# Patient Record
Sex: Male | Born: 1935 | Race: White | Hispanic: No | Smoking: Never smoker
Health system: Southern US, Community
[De-identification: ages and names within clinical notes are randomized; demographics above are authoritative.]

## PROBLEM LIST (undated history)

## (undated) DIAGNOSIS — I459 Conduction disorder, unspecified: Secondary | ICD-10-CM

## (undated) DIAGNOSIS — F419 Anxiety disorder, unspecified: Secondary | ICD-10-CM

## (undated) DIAGNOSIS — I480 Paroxysmal atrial fibrillation: Secondary | ICD-10-CM

## (undated) DIAGNOSIS — I10 Essential (primary) hypertension: Secondary | ICD-10-CM

## (undated) DIAGNOSIS — M1990 Unspecified osteoarthritis, unspecified site: Secondary | ICD-10-CM

## (undated) DIAGNOSIS — I2510 Atherosclerotic heart disease of native coronary artery without angina pectoris: Secondary | ICD-10-CM

## (undated) DIAGNOSIS — F329 Major depressive disorder, single episode, unspecified: Secondary | ICD-10-CM

## (undated) DIAGNOSIS — F32A Depression, unspecified: Secondary | ICD-10-CM

## (undated) DIAGNOSIS — E785 Hyperlipidemia, unspecified: Secondary | ICD-10-CM

## (undated) HISTORY — PX: OTHER SURGICAL HISTORY: SHX169

## (undated) HISTORY — DX: Atherosclerotic heart disease of native coronary artery without angina pectoris: I25.10

## (undated) HISTORY — PX: INGUINAL HERNIA REPAIR: SUR1180

## (undated) HISTORY — DX: Essential (primary) hypertension: I10

## (undated) HISTORY — DX: Hyperlipidemia, unspecified: E78.5

## (undated) HISTORY — PX: MYRINGOTOMY WITH TUBE PLACEMENT: SHX5663

## (undated) HISTORY — PX: TONSILLECTOMY AND ADENOIDECTOMY: SUR1326

## (undated) HISTORY — PX: APPENDECTOMY: SHX54

## (undated) HISTORY — PX: CHOLECYSTECTOMY OPEN: SUR202

## (undated) HISTORY — PX: CATARACT EXTRACTION: SUR2

## (undated) HISTORY — PX: COLONOSCOPY W/ BIOPSIES AND POLYPECTOMY: SHX1376

---

## 2004-09-24 ENCOUNTER — Ambulatory Visit: Payer: Self-pay | Admitting: Cardiology

## 2004-10-23 ENCOUNTER — Ambulatory Visit: Payer: Self-pay | Admitting: Family Medicine

## 2005-05-16 ENCOUNTER — Ambulatory Visit: Payer: Self-pay | Admitting: Cardiology

## 2005-06-03 ENCOUNTER — Ambulatory Visit: Payer: Self-pay | Admitting: Cardiology

## 2005-06-11 ENCOUNTER — Ambulatory Visit: Payer: Self-pay

## 2005-07-01 ENCOUNTER — Ambulatory Visit: Payer: Self-pay | Admitting: Family Medicine

## 2005-09-18 ENCOUNTER — Ambulatory Visit: Payer: Self-pay | Admitting: Family Medicine

## 2005-10-22 ENCOUNTER — Ambulatory Visit: Payer: Self-pay | Admitting: Family Medicine

## 2006-01-08 ENCOUNTER — Ambulatory Visit: Payer: Self-pay | Admitting: Family Medicine

## 2006-05-27 ENCOUNTER — Ambulatory Visit: Payer: Self-pay | Admitting: Cardiology

## 2006-06-23 ENCOUNTER — Ambulatory Visit: Payer: Self-pay | Admitting: Cardiology

## 2006-07-03 ENCOUNTER — Ambulatory Visit: Payer: Self-pay | Admitting: Family Medicine

## 2006-08-21 ENCOUNTER — Ambulatory Visit: Payer: Self-pay | Admitting: Family Medicine

## 2007-06-03 ENCOUNTER — Ambulatory Visit: Payer: Self-pay | Admitting: Cardiology

## 2007-09-01 ENCOUNTER — Ambulatory Visit: Payer: Self-pay | Admitting: Family Medicine

## 2007-09-17 ENCOUNTER — Telehealth: Payer: Self-pay | Admitting: Family Medicine

## 2007-09-29 ENCOUNTER — Ambulatory Visit: Payer: Self-pay | Admitting: Family Medicine

## 2007-09-29 DIAGNOSIS — E785 Hyperlipidemia, unspecified: Secondary | ICD-10-CM | POA: Insufficient documentation

## 2007-09-29 DIAGNOSIS — I2510 Atherosclerotic heart disease of native coronary artery without angina pectoris: Secondary | ICD-10-CM | POA: Insufficient documentation

## 2007-09-29 DIAGNOSIS — F341 Dysthymic disorder: Secondary | ICD-10-CM | POA: Insufficient documentation

## 2007-09-29 DIAGNOSIS — N400 Enlarged prostate without lower urinary tract symptoms: Secondary | ICD-10-CM | POA: Insufficient documentation

## 2007-10-08 ENCOUNTER — Encounter: Payer: Self-pay | Admitting: Family Medicine

## 2007-10-08 LAB — CONVERTED CEMR LAB: PSA: 0.36 ng/mL (ref 0.10–4.00)

## 2008-04-12 ENCOUNTER — Ambulatory Visit: Payer: Self-pay | Admitting: Family Medicine

## 2008-04-12 DIAGNOSIS — G4700 Insomnia, unspecified: Secondary | ICD-10-CM | POA: Insufficient documentation

## 2008-04-12 DIAGNOSIS — C44300 Unspecified malignant neoplasm of skin of unspecified part of face: Secondary | ICD-10-CM | POA: Insufficient documentation

## 2008-04-12 DIAGNOSIS — M722 Plantar fascial fibromatosis: Secondary | ICD-10-CM | POA: Insufficient documentation

## 2008-04-12 DIAGNOSIS — C44309 Unspecified malignant neoplasm of skin of other parts of face: Secondary | ICD-10-CM | POA: Insufficient documentation

## 2008-05-04 ENCOUNTER — Encounter: Payer: Self-pay | Admitting: Family Medicine

## 2008-05-26 ENCOUNTER — Ambulatory Visit: Payer: Self-pay | Admitting: Cardiology

## 2008-05-26 LAB — CONVERTED CEMR LAB
Albumin: 3.6 g/dL (ref 3.5–5.2)
BUN: 33 mg/dL — ABNORMAL HIGH (ref 6–23)
Basophils Relative: 0.5 % (ref 0.0–1.0)
Bilirubin, Direct: 0.2 mg/dL (ref 0.0–0.3)
Calcium: 8.8 mg/dL (ref 8.4–10.5)
Chloride: 107 meq/L (ref 96–112)
Creatinine, Ser: 1.2 mg/dL (ref 0.4–1.5)
GFR calc Af Amer: 74 mL/min
GFR calc non Af Amer: 61 mL/min
Glucose, Bld: 104 mg/dL — ABNORMAL HIGH (ref 70–99)
HCT: 45.8 % (ref 39.0–52.0)
LDL Cholesterol: 74 mg/dL (ref 0–99)
Lymphocytes Relative: 20.8 % (ref 12.0–46.0)
MCHC: 34.7 g/dL (ref 30.0–36.0)
Monocytes Relative: 8.5 % (ref 3.0–12.0)
RDW: 12.9 % (ref 11.5–14.6)
Sodium: 138 meq/L (ref 135–145)
Total Protein: 6.5 g/dL (ref 6.0–8.3)

## 2008-06-22 ENCOUNTER — Telehealth: Payer: Self-pay | Admitting: Family Medicine

## 2008-08-23 ENCOUNTER — Ambulatory Visit: Payer: Self-pay | Admitting: Family Medicine

## 2008-09-17 ENCOUNTER — Emergency Department (HOSPITAL_COMMUNITY): Admission: EM | Admit: 2008-09-17 | Discharge: 2008-09-17 | Payer: Self-pay | Admitting: Emergency Medicine

## 2008-11-01 ENCOUNTER — Ambulatory Visit: Payer: Self-pay | Admitting: Family Medicine

## 2009-06-02 DIAGNOSIS — I10 Essential (primary) hypertension: Secondary | ICD-10-CM | POA: Insufficient documentation

## 2009-06-05 ENCOUNTER — Ambulatory Visit: Payer: Self-pay | Admitting: Cardiology

## 2009-06-05 DIAGNOSIS — I509 Heart failure, unspecified: Secondary | ICD-10-CM | POA: Insufficient documentation

## 2009-08-15 ENCOUNTER — Ambulatory Visit: Payer: Self-pay | Admitting: Family Medicine

## 2009-10-10 ENCOUNTER — Telehealth: Payer: Self-pay | Admitting: Family Medicine

## 2009-12-26 ENCOUNTER — Ambulatory Visit: Payer: Self-pay | Admitting: Family Medicine

## 2009-12-26 DIAGNOSIS — L851 Acquired keratosis [keratoderma] palmaris et plantaris: Secondary | ICD-10-CM | POA: Insufficient documentation

## 2009-12-26 DIAGNOSIS — J029 Acute pharyngitis, unspecified: Secondary | ICD-10-CM | POA: Insufficient documentation

## 2009-12-26 DIAGNOSIS — J209 Acute bronchitis, unspecified: Secondary | ICD-10-CM | POA: Insufficient documentation

## 2010-01-02 ENCOUNTER — Telehealth: Payer: Self-pay | Admitting: Family Medicine

## 2010-03-30 ENCOUNTER — Telehealth: Payer: Self-pay | Admitting: Cardiology

## 2010-05-01 ENCOUNTER — Telehealth: Payer: Self-pay | Admitting: Cardiology

## 2010-05-02 ENCOUNTER — Telehealth: Payer: Self-pay | Admitting: Cardiology

## 2010-06-04 ENCOUNTER — Ambulatory Visit: Payer: Self-pay | Admitting: Cardiology

## 2010-06-05 ENCOUNTER — Encounter: Payer: Self-pay | Admitting: Cardiology

## 2010-07-30 ENCOUNTER — Ambulatory Visit: Payer: Self-pay | Admitting: Family Medicine

## 2010-08-27 ENCOUNTER — Telehealth: Payer: Self-pay | Admitting: Cardiology

## 2010-09-04 ENCOUNTER — Telehealth: Payer: Self-pay | Admitting: Cardiology

## 2010-10-24 ENCOUNTER — Telehealth: Payer: Self-pay | Admitting: Cardiology

## 2010-11-20 ENCOUNTER — Telehealth: Payer: Self-pay | Admitting: Cardiology

## 2010-12-16 LAB — CONVERTED CEMR LAB
AST: 23 units/L (ref 0–37)
Albumin: 3.7 g/dL (ref 3.5–5.2)
Alkaline Phosphatase: 79 units/L (ref 39–117)
Bilirubin, Direct: 0.1 mg/dL (ref 0.0–0.3)
Bilirubin, Direct: 0.2 mg/dL (ref 0.0–0.3)
Cholesterol: 125 mg/dL (ref 0–200)
GFR calc non Af Amer: 67.24 mL/min (ref >60)
Glucose, Bld: 101 mg/dL — ABNORMAL HIGH (ref 70–99)
HCT: 44.7 % (ref 39.0–52.0)
Hemoglobin: 15.6 g/dL (ref 13.0–17.0)
Lymphocytes Relative: 20.1 % (ref 12.0–46.0)
MCHC: 34.8 g/dL (ref 30.0–36.0)
Monocytes Absolute: 0.5 10*3/uL (ref 0.1–1.0)
Neutro Abs: 3.7 10*3/uL (ref 1.4–7.7)
Neutrophils Relative %: 68.6 % (ref 43.0–77.0)
Platelets: 109 10*3/uL — ABNORMAL LOW (ref 150.0–400.0)
Potassium: 4.6 meq/L (ref 3.5–5.1)
RBC: 4.82 M/uL (ref 4.22–5.81)
Sodium: 141 meq/L (ref 135–145)
Total Bilirubin: 1.7 mg/dL — ABNORMAL HIGH (ref 0.3–1.2)
Total CHOL/HDL Ratio: 4
VLDL: 19.6 mg/dL (ref 0.0–40.0)
VLDL: 20.6 mg/dL (ref 0.0–40.0)

## 2010-12-20 NOTE — Progress Notes (Signed)
Summary: [facility] with [facility]   Phone Note Call from Patient Call back at Home Phone [phone]   Caller: Patient Reason for Call: Talk to Nurse Details for Reason: pt having a hard time getting [facility]. offer to send to local pharmacy, pt explain it very detail will explain to pam when she calls. Initial call taken by: Lorne Skeens,  September 04, 2010 11:04 AM  Follow-up for Phone Call        Spoke with pt - [facility] told [facility] that they hadn't sent the rx for simvastatin and metoprolol because of the signature on the rx.  pt has recieved lisinopril that was sent the same time and same way - after speaking with [facility]  pt states they said they will be sent ASAP.  he will let us know if he doesnt recieve them in time. Follow-up by: Charolotte Capuchin, RN,  September 04, 2010 11:26 AM

## 2010-12-20 NOTE — Letter (Signed)
Summary: Custom - Lipid  [facility], Main Office  [address]   [place], [place] [address]   Phone: [phone]  Fax: [phone]         June 05, 2010 MRN: [id number]     [name] [name] [address] [place], [place]  [address]     Dear Mr. Corigliano,  We have reviewed your cholesterol results.  They are as follows:     Total Cholesterol:    129 (Desirable: less than 200)       HDL  Cholesterol:     35.20  (Desirable: greater than 40 for men and 50 for women)       LDL Cholesterol:       73  (Desirable: less than 100 for low risk and less than 70 for moderate to high risk)       Triglycerides:       103.0  (Desirable: less than 150)  Our recommendations include:  NO change in treatment   Call our office at the number listed above if you have any questions.  Lowering your LDL cholesterol is important, but it is only one of a large number of "risk factors" that may indicate that you are at risk for heart disease, stroke or other complications of hardening of the arteries.  Other risk factors include:   A.  Cigarette Smoking* B.  High Blood Pressure* C.  Obesity* D.   Low HDL Cholesterol (see yours above)* E.   Diabetes Mellitus (higher risk if your is uncontrolled) F.  Family history of premature heart disease G.  Previous history of stroke or cardiovascular disease        *These are risk factors YOU HAVE CONTROL OVER.  For more information, visit .  There is now evidence that lowering the TOTAL CHOLESTEROL AND LDL CHOLESTEROL can reduce the risk of heart disease.  The American Heart Association recommends the following guidelines for the treatment of elevated cholesterol:  1.  If there is now current heart disease and less than two risk factors, TOTAL CHOLESTEROL should be less than 200 and LDL CHOLESTEROL should be less than 100. 2.  If there is current heart disease or two or more risk factors, TOTAL CHOLESTEROL should be less than 200 and  LDL CHOLESTEROL should be less than 70.  A diet low in cholesterol, saturated fat, and calories is the cornerstone of treatment for elevated cholesterol.  Cessation of smoking and exercise are also important in the management of elevated cholesterol and preventing vascular disease.  Studies have shown that 30 to 60 minutes of physical activity most days can help lower blood pressure, lower cholesterol, and keep your weight at a healthy level.  Drug therapy is used when cholesterol levels do not respond to therapeutic lifestyle changes (smoking cessation, diet, and exercise) and remains unacceptably high.  If medication is started, it is important to have you levels checked periodically to evaluate the need for further treatment options.      Thank you,     Sander Nephew, RN for Dr. Rollene Rotunda [facility] Team

## 2010-12-20 NOTE — Progress Notes (Signed)
Summary: refill request   Phone Note Refill Request Message from:  Patient on [facility]  Refills Requested: Medication #1:  TOPROL XL 50 MG XR24H-TAB 1 by mouth daily  Medication #2:  SIMVASTATIN 40 MG TABS Take one tablet by mouth daily at bedtime  Medication #3:  LISINOPRIL 10 MG TABS 1 by mouth daily [facility] 90 day supply with refills/if need to talk to pt [phone]   Method Requested: Telephone to Pharmacy Initial call taken by: Glynda Jaeger,  November 20, 2010 8:35 AM  Follow-up for Phone Call        RX sent into [facility]. Line busy. Marrion Coy, CNA  November 20, 2010 10:37 AM  Follow-up by: Marrion Coy, CNA,  November 20, 2010 10:37 AM  Additional Follow-up for Phone Call Additional follow up Details #1::        Pt notified. Marrion Coy, CNA  November 20, 2010 1:59 PM  Additional Follow-up by: Marrion Coy, CNA,  November 20, 2010 1:59 PM    Prescriptions: LISINOPRIL 10 MG TABS (LISINOPRIL) 1 by mouth daily  #90 x 3   Entered by:   Marrion Coy, CNA   Authorized by:   Rollene Rotunda, MD, [facility]   Signed by:   Marrion Coy, CNA on 11/20/2010   Method used:   Faxed to ...       [facility] [place] (mail-order)             , [place]         Ph: [phone]       Fax: [phone]   RxID:   [id number] TOPROL XL 50 MG XR24H-TAB (METOPROLOL SUCCINATE) 1 by mouth daily  #90 x 3   Entered by:   Marrion Coy, CNA   Authorized by:   Rollene Rotunda, MD, [facility]   Signed by:   Marrion Coy, CNA on 11/20/2010   Method used:   Faxed to ...       [facility] [place] (mail-order)             , [place]         Ph: [phone]       Fax: [phone]   RxID:   [id number] SIMVASTATIN 40 MG TABS (SIMVASTATIN) Take one tablet by mouth daily at bedtime  #90 x 3   Entered by:   Marrion Coy, CNA   Authorized by:   Rollene Rotunda, MD, [facility]   Signed by:   Marrion Coy, CNA on 11/20/2010   Method used:   Faxed to ...       [facility] [place] (mail-order)             , [place]         Ph: [phone]       Fax: [phone]   RxID:    [id number]

## 2010-12-20 NOTE — Assessment & Plan Note (Signed)
Summary: flu shot/njr   Nurse Visit   Review of Systems       Flu Vaccine Consent Questions     Do you have a history of severe allergic reactions to this vaccine? no    Any prior history of allergic reactions to egg and/or gelatin? no    Do you have a sensitivity to the preservative Thimersol? no    Do you have a past history of Guillan-Barre Syndrome? no    Do you currently have an acute febrile illness? no    Have you ever had a severe reaction to latex? no    Vaccine information given and explained to patient? yes    Are you currently pregnant? no    Lot Number:[id number]   Exp Date:05/18/2011   Site Given  Left Deltoid IM Josph Macho RMA  July 30, 2010 3:11 PM    Allergies: No Known Drug Allergies  Orders Added: 1)  Flu Vaccine 3yrs + MEDICARE PATIENTS [Q2039] 2)  Administration Flu vaccine - MCR [G0008]

## 2010-12-20 NOTE — Progress Notes (Signed)
Summary: [facility]ill [facility]  Medications Added SIMVASTATIN 40 MG TABS (SIMVASTATIN) Take one tablet by mouth daily at bedtime TOPROL XL 50 MG XR24H-TAB (METOPROLOL SUCCINATE) 1 by mouth daily       Phone Note Refill Request Call back at Home Phone [phone] Message from:  Patient on August 27, 2010 12:54 PM  Refills Requested: Medication #1:  SIMVASTATIN 40 MG TABS Take one tablet by mouth daily at bedtime  Medication #2:  TOPROL XL 50 MG XR24H-TAB 1 by mouth daily  Medication #3:  TOPROL XL 50 MG XR24H-TAB 1 by [facility]   Method Requested: Fax to [facility] Initial call taken by: Lorne Skeens,  August 27, 2010 12:55 PM  Follow-up for Phone Call        RX sent into pharmacy. Pt notified. Marrion Coy, CNA  August 27, 2010 12:57 PM  Follow-up by: Marrion Coy, CNA,  August 27, 2010 12:57 PM    New/Updated Medications: SIMVASTATIN 40 MG TABS (SIMVASTATIN) Take one tablet by mouth daily at bedtime TOPROL XL 50 MG XR24H-TAB (METOPROLOL SUCCINATE) 1 by mouth daily Prescriptions: TOPROL XL 50 MG XR24H-TAB (METOPROLOL SUCCINATE) 1 by mouth daily  #90 x 3   Entered by:   Marrion Coy, CNA   Authorized by:   Rollene Rotunda, MD, [facility]   Signed by:   Marrion Coy, CNA on 08/27/2010   Method used:   Faxed to ...       [facility] education [facility])       [address] [address]       [place], [place]  [address]       Ph: [phone]       Fax: [phone]   RxID:   [id number] SIMVASTATIN 40 MG TABS (SIMVASTATIN) Take one tablet by mouth daily at bedtime  #90 x 3   Entered by:   Marrion Coy, CNA   Authorized by:   Rollene Rotunda, MD, [facility]   Signed by:   Marrion Coy, CNA on 08/27/2010   Method used:   Faxed to ...       [facility] Environmental education officer)       [address] [address]       [place], [place]  [address]       Ph: [phone]       Fax: [phone]   RxID:   [id number]

## 2010-12-20 NOTE — Assessment & Plan Note (Signed)
Summary: YEARLY./SL  Medications Added VIAGRA 50 MG TABS (SILDENAFIL CITRATE) take one by mouth one hour prior to sexual activity      Allergies Added: NKDA   Visit Type:  Follow-up Primary Provider:  Judithann Sheen MD  CC:  Cardiomyopathy.  History of Present Illness: The patient presents for yearly followup. From a cardiovascular standpoint he has done well. He denies any new chest pressure, neck or arm discomfort. He has no new palpitations, presyncope or syncope. He has some rare dizziness after working. He's had some rare chest discomfort that is similar to previous reflux. He is still able to do his gardening. He has some limitations because of joint discomfort particularly in his shoulders.  Current Medications (verified): 1)  Xanax 1 Mg Tabs (Alprazolam) .Marland Kitchen.. 1 By Mouth Two Times A Day Needs Appt With Dr Scotty Court. 2)  Simvastatin 40 Mg Tabs (Simvastatin) .... Take One Tablet By Mouth Daily At Bedtime 3)  Toprol Xl 50 Mg Xr24h-Tab (Metoprolol Succinate) .Marland Kitchen.. 1 By Mouth Daily 4)  Lisinopril 10 Mg Tabs (Lisinopril) .Marland Kitchen.. 1 By Mouth Daily 5)  Asa 81 Mg .Marland Kitchen.. 1 Once Daily  Allergies (verified): No Known Drug Allergies  Past History:  Past Medical History: Reviewed history from 06/02/2009 and no changes required. Hypertension, depression, loss of vision in his   right eye, tonsillectomy, colonic polyps, cholecystectomy, bilateral   inguinal hernia repair, and stress perfusion study demonstrating an EF   of 46% with moderate ischemia in the inferior wall and apex (this has   been managed medically).   Past Surgical History: Reviewed history from 06/02/2009 and no changes required.  Tonsillectomy, colonic polyps, cholecystectomy, bilateral   inguinal hernia repair  Review of Systems       As stated in the HPI and negative for all other systems.   Vital Signs:  Patient profile:   77 year old male Height:      69 inches Weight:      184 pounds BMI:      27.27 Pulse rate:   53 / minute Resp:     16 per minute BP sitting:   148 / 76  (right arm)  Vitals Entered By: Marrion Coy, CNA (June 04, 2010 9:04 AM)  Physical Exam  General:  Well developed, well nourished, in no acute distress. Head:  normocephalic and atraumatic Eyes:  Right lens opacification,  Mouth:  pharyngeal erythema.   Neck:  Neck supple, no JVD. No masses, thyromegaly or abnormal cervical nodes. Chest Wall:  no deformities or breast masses noted Lungs:  Clear bilaterally to auscultation and percussion. Abdomen:  Bowel sounds positive; abdomen soft and non-tender without masses, organomegaly, or hernias noted. No hepatosplenomegaly. Msk:  Right upper extremity muscular wasting Extremities:  No clubbing or cyanosis. Neurologic:  Alert and oriented x 3. Skin:  Intact without lesions or rashes. Cervical Nodes:  no significant adenopathy Inguinal Nodes:  no significant adenopathy Psych:  Normal affect.   Detailed Cardiovascular Exam  Neck    Carotids: Carotids full and equal bilaterally without bruits.      Neck Veins: Normal, no JVD.    Heart    Inspection: no deformities or lifts noted.      Palpation: normal PMI with no thrills palpable.      Auscultation: regular rate and rhythm, S1, S2 without murmurs, rubs, gallops, or clicks.    Vascular    Abdominal Aorta: no palpable masses, pulsations, or audible bruits.      Femoral  Pulses: normal femoral pulses bilaterally.      Pedal Pulses: normal pedal pulses bilaterally.      Radial Pulses: normal radial pulses bilaterally.      Peripheral Circulation: no clubbing, cyanosis, or edema noted with normal capillary refill.     EKG  Procedure date:  06/04/2010  Findings:      Sinus rhythm, rate 53, axis within normal limits, intervals within normal limits, RSR prime V1, incomplete right bundle branch block  Impression & Recommendations:  Problem # 1:  CARDIOMYOPATHY (ICD-425.4) He has no symptoms and no  suggestion that his EF is lower. He will continue with medical management. Orders: EKG w/ Interpretation (93000)  Problem # 2:  HYPERTENSION (ICD-401.9) His blood pressure is very slightly elevated but he did not take his medications. I will make no change to his regimen. His pressure has been well-controlled at other appointments.  Problem # 3:  HYPERLIPIDEMIA (ICD-272.4) He is getting a lipid profile today. The goal will be an LDL less than 100 and HDL greater than 40.  Problem # 4:  CAD (ICD-414.00) He has had an abnormal stress test with evidence of coronary disease. He is being managed medically with risk reduction. Orders: EKG w/ Interpretation (93000)  Patient Instructions: 1)  Your physician recommends that you schedule a follow-up appointment in: 12 months with Dr Antoine Poche 2)  Your physician recommends that you continue on your current medications as directed. Please refer to the Current Medication list given to you today. Prescriptions: VIAGRA 50 MG TABS (SILDENAFIL CITRATE) take one by mouth one hour prior to sexual activity  #20 x 1   Entered by:   Charolotte Capuchin, RN   Authorized by:   Rollene Rotunda, MD, [facility]   Signed by:   Charolotte Capuchin, RN on 06/04/2010   Method used:   Electronically to        [facility]* (retail)       [address]       [place], [place]  [id number]       Ph: [phone]       Fax: [phone]   RxID:   [id number]  I have reviewed and approved all prescriptions at the time of this visit. Rollene Rotunda, MD, [facility]  June 04, 2010 9:41 AM

## 2010-12-20 NOTE — Progress Notes (Signed)
Summary: still coughing but better  Phone Note Call from Patient Call back at Home Phone [phone]   Caller: Patient Call For: Judithann Sheen MD Summary of Call: pt was given a z-pack on 12-26-2009 pt little better.Marland Kitchen dx with flu, pharmacy gate [phone] Initial call taken by: Heron Sabins,  January 02, 2010 8:48 AM  Follow-up for Phone Call        took z-pak and taking cough syrup, cough seems looser. No appetite and fatigued but no fever or SOB. Just wondered if he should have more medicine. I told him it will probably take some time to fully recover; rest and fluids. Follow-up by: Raechel Ache, RN,  January 02, 2010 9:08 AM  Additional Follow-up for Phone Call Additional follow up Details #1::        per dr Scotty Court good advice and if no better ov  Additional Follow-up by: Pura Spice, RN,  January 02, 2010 11:01 AM

## 2010-12-20 NOTE — Progress Notes (Signed)
Summary: lab work   Phone Note Call from Patient Call back at [employer] [phone]   Caller: Patient Reason for Call: Talk to Nurse Summary of Call: request lab work prior to appt in July Initial call taken by: Migdalia Dk,  Mar 30, 2010 9:36 AM  Follow-up for Phone Call        discussed with pt--pt wants cholesterol checked along with any other lab Dr Antoine Poche wants him to have the morning of his appointment with Dr Antoine Poche in July--will forward to Dr Antoine Poche for lab orders Katina Dung, RN, BSN  Mar 30, 2010 10:11 AM   Additional Follow-up for Phone Call Additional follow up Details #1::        Schedule lipid and liver. LM for pt to call Katina Dung, RN, BSN  Mar 30, 2010 3:17 PM ---called pt and ordered lipid/liver prior to July 18 appt with Dr Antoine Poche Additional Follow-up by: Rollene Rotunda, MD, [facility],  Mar 30, 2010 1:44 PM

## 2010-12-20 NOTE — Assessment & Plan Note (Signed)
Summary: no appetite/?flu/njr   Vital Signs:  Patient profile:   77 year old male Weight:      188 pounds O2 Sat:      96 % on Room air Temp:     97.6 degrees F Pulse rate:   120 / minute BP sitting:   96 / 70  (left arm)  Vitals Entered By: Gladis Riffle, RN (December 26, 2009 10:29 AM)  O2 Flow:  Room air CC: c/o sore throat, cough, congestion, nausea and no appetite since 2/3 Is Patient Diabetic? No Comments denies fever,--has constipation but states has not eaten x 3 days   History of Present Illness: This 77 year old white male has a history of having had taking cough sore throat and chills over the past week and increasing in severity. He has been taking Aleve for the headache and, continues to have a bad cough and general malaise, also no appetite. No history of vomiting or diarrhea As lesion of left temporal AV block from his check No other complaints blood pressure stable  Preventive Screening-Counseling & Management  Alcohol-Tobacco     Smoking Status: never  Medications Prior to Update: 1)  Xanax 1 Mg Tabs (Alprazolam) .Marland Kitchen.. 1 By Mouth Two Times A Day Needs Appt With Dr Scotty Court. 2)  Simvastatin 40 Mg Tabs (Simvastatin) .... Take One Tablet By Mouth Daily At Bedtime 3)  Toprol Xl 50 Mg .Marland Kitchen.. 1 Once Daily 4)  Lisinopril 10 Mg .Marland Kitchen.. 1 Once Daily 5)  Asa 81 Mg .Marland Kitchen.. 1 Once Daily  Allergies (verified): No Known Drug Allergies  Past History:  Past Medical History: Last updated: 06/02/2009 Hypertension, depression, loss of vision in his   right eye, tonsillectomy, colonic polyps, cholecystectomy, bilateral   inguinal hernia repair, and stress perfusion study demonstrating an EF   of 46% with moderate ischemia in the inferior wall and apex (this has   been managed medically).   Social History: Last updated: 06/02/2009  Non-smoker, non-drinker, no drug abuse, married  NOTE - opthamologist - [place]   Risk Factors: Smoking Status: never (12/26/2009)  Social  History: Smoking Status:  never  Review of Systems  The patient denies anorexia, fever, weight loss, weight gain, vision loss, decreased hearing, hoarseness, chest pain, syncope, dyspnea on exertion, peripheral edema, prolonged cough, headaches, hemoptysis, abdominal pain, melena, hematochezia, severe indigestion/heartburn, hematuria, incontinence, genital sores, muscle weakness, suspicious skin lesions, transient blindness, difficulty walking, depression, unusual weight change, abnormal bleeding, enlarged lymph nodes, angioedema, breast masses, and testicular masses.    Physical Exam  General:  Well-developed,well-nourished,in no acute distress; alert,appropriate and cooperative throughout examination Head:  Normocephalic and atraumatic without obvious abnormalities. No apparent alopecia or balding. Eyes:  No corneal or conjunctival inflammation noted. EOMI. Perrla. Funduscopic exam benign, without hemorrhages, exudates or papilledema. Vision grossly normal. Ears:  External ear exam shows no significant lesions or deformities.  Otoscopic examination reveals clear canals, tympanic membranes are intact bilaterally without bulging, retraction, inflammation or discharge. Hearing is grossly normal bilaterally. Nose:  moderate nasal congestion with slightly yellowed drainage Mouth:  pharyngeal erythema.   Lungs:  rhonchi bilaterally no rales no dullness Heart:  Normal rate and regular rhythm. S1 and S2 normal without gallop, murmur, click, rub or other extra sounds. Skin:  hyperkeratotic lesion on left temporal region of scalp   Impression & Recommendations:  Problem # 1:  PHARYNGITIS, ACUTE (ICD-462) Assessment New  His updated medication list for this problem includes:    Zithromax Z-pak 250 Mg Tabs (Azithromycin) .Marland KitchenMarland KitchenMarland KitchenMarland Kitchen  2 stat then 1 each day  Orders: Prescription Created Electronically [phone])  Problem # 2:  ACUTE BRONCHITIS (ICD-466.0) Assessment: New  His updated medication list for  this problem includes:    Zithromax Z-pak 250 Mg Tabs (Azithromycin) .Marland Kitchen... 2 stat then 1 each day    Hydromet 5-1.5 Mg/37ml Syrp (Hydrocodone-homatropine) .Marland Kitchen... 2 tsp q4h as needed cough  Orders: Prescription Created Electronically [phone])  Problem # 3:  HYPERTENSION (ICD-401.9) Assessment: Improved  Problem # 4:  INSOMNIA (ICD-780.52) Assessment: Improved  Problem # 5:  ANXIETY DEPRESSION (ICD-300.4) Assessment: Improved  Problem # 6:  HYPERKERATOSIS (ICD-701.1) Assessment: New  Complete Medication List: 1)  Xanax 1 Mg Tabs (Alprazolam) .Marland Kitchen.. 1 by mouth two times a day needs appt with dr Scotty Court. 2)  Simvastatin 40 Mg Tabs (Simvastatin) .... Take one tablet by mouth daily at bedtime 3)  Toprol Xl 50 Mg  .Marland Kitchen.. 1 once daily 4)  Lisinopril 10 Mg  .Marland Kitchen.. 1 once daily 5)  Asa 81 Mg  .Marland Kitchen.. 1 once daily 6)  Zithromax Z-pak 250 Mg Tabs (Azithromycin) .... 2 stat then 1 each day 7)  Hydromet 5-1.5 Mg/54ml Syrp (Hydrocodone-homatropine) .... 2 tsp q4h as needed cough  Patient Instructions: 1)  Acute upper respiratory infection, pharyngitis and 2)  acute bronchitis 3)  Zpack 4)  Hydromet cough medicine 5)  Increase your fluid intake 6)  continue aleve for aching and fever Prescriptions: HYDROMET 5-1.5 MG/5ML SYRP (HYDROCODONE-HOMATROPINE) 2 tsp q4h as needed cough  #240 cc x 1   Entered and Authorized by:   Judithann Sheen MD   Signed by:   Judithann Sheen MD on 12/26/2009   Method used:   Print then Give to Patient   RxID:   [id number] ZITHROMAX Z-PAK 250 MG TABS (AZITHROMYCIN) 2 stat then 1 each day  #1 pkge x 0   Entered and Authorized by:   Judithann Sheen MD   Signed by:   Judithann Sheen MD on 12/26/2009   Method used:   Electronically to        [facility]* (retail)       [address]       [place], [place]  [id number]       Ph: [phone]       Fax: [phone]   RxID:   [id number]

## 2010-12-20 NOTE — Progress Notes (Signed)
Summary: refill meds   Phone Note Call from Patient Call back at Home Phone [phone] Message from:  Patient on October 24, 2010 9:42 AM  Refills Requested: Medication #1:  SIMVASTATIN 40 MG TABS Take one tablet by mouth daily at bedtime   Supply Requested: 3 months  Medication #2:  TOPROL XL 50 MG XR24H-TAB 1 by mouth daily   Supply Requested: 3 months  Medication #3:  LISINOPRIL 10 MG TABS 1 by mouth daily   Supply Requested: 3 months [facility]. with 3 refill.   Method Requested: Fax to [facility] Away Pharm[facility] Initial call taken by: Lorne Skeens,  October 24, 2010 9:43 AM Caller: Patient Reason for Call: Talk to Nurse Summary of Call: per pt calling, pt [facility]. pt now cover with [employer] of [place]. pt [facility] rx to [facility] member id# [id number]. effective date 11-18-2010. Initial call taken by: Lorne Skeens,  October 24, 2010 9:43 AM  Follow-up for Phone Call        I talked with Mrs. Wittmeyer and her husband will return call. Mylo Red RN  He wants his cardiac meds. to be switched to [facility] [place] but his [employer] doesn't take effect until 01/12. We can't refill his meds. until his new insurance takes effect. He belives he has enough to last him but will call us back if we need to refill them until we can send them to [facility]. Whitney Maeola Sarah RN  October 24, 2010 3:50 PM  Follow-up by: Whitney Maeola Sarah RN,  October 24, 2010 3:50 PM

## 2010-12-20 NOTE — Progress Notes (Signed)
Summary: pt needs refill  Medications Added SIMVASTATIN 40 MG TABS (SIMVASTATIN) Take one tablet by mouth daily at bedtime TOPROL XL 50 MG XR24H-TAB (METOPROLOL SUCCINATE) 1 by mouth daily LISINOPRIL 10 MG TABS (LISINOPRIL) 1 by mouth daily       Phone Note Refill Request Call back at Home Phone [phone] Message from:  [facility] ID# [id number]  Refills Requested: Medication #1:  TOPROL XL 50 MG 1 once daily  Medication #2:  SIMVASTATIN 40 MG TABS Take one tablet by mouth daily at bedtime  Medication #3:  LISINOPRIL 10 MG 1 once daily per pt the nurse always dose his Rx and knows what he needs he called [facility] and they said they don't call us we have to contact them. When I asked the patient what he needed he said the nurse knew what he needed.  Initial call taken by: Omer Jack,  May 02, 2010 12:39 PM  Follow-up for Phone Call        [facility] for pt to call back. Marrion Coy, CNA  May 02, 2010 3:29 PM  Follow-up by: Marrion Coy, CNA,  May 02, 2010 3:29 PM    New/Updated Medications: SIMVASTATIN 40 MG TABS (SIMVASTATIN) Take one tablet by mouth daily at bedtime TOPROL XL 50 MG XR24H-TAB (METOPROLOL SUCCINATE) 1 by mouth daily LISINOPRIL 10 MG TABS (LISINOPRIL) 1 by mouth daily Prescriptions: SIMVASTATIN 40 MG TABS (SIMVASTATIN) Take one tablet by mouth daily at bedtime  #90 x 0   Entered by:   Marrion Coy, CNA   Authorized by:   Rollene Rotunda, MD, [facility]   Signed by:   Marrion Coy, CNA on 05/02/2010   Method used:   Faxed to ...       [facility] education [facility])       [address] [address]       [place], [place]  [address]       Ph: [phone]       Fax: [phone]   RxID:   [id number] LISINOPRIL 10 MG TABS (LISINOPRIL) 1 by mouth daily  #90 x 0   Entered by:   Marrion Coy, CNA   Authorized by:   Rollene Rotunda, MD, [facility]   Signed by:   Marrion Coy, CNA on 05/02/2010   Method used:   Faxed to ...       [facility] Environmental education officer)  [address] [address]       [place], [place]  [address]       Ph: [phone]       Fax: [phone]   RxID:   [id number] TOPROL XL 50 MG XR24H-TAB (METOPROLOL SUCCINATE) 1 by mouth daily  #90 x 0   Entered by:   Marrion Coy, CNA   Authorized by:   Rollene Rotunda, MD, [facility]   Signed by:   Marrion Coy, CNA on 05/02/2010   Method used:   Faxed to ...       [facility] Environmental education officer)       [address] [address]       [place], [place]  [address]       Ph: [phone]       Fax: [phone]   RxID:   [id number]

## 2010-12-20 NOTE — Progress Notes (Signed)
Summary: med refill   Phone Note Call from Patient Call back at Home Phone [phone]   Caller: Patient Reason for Call: Talk to Nurse Summary of Call: needs refills on meds, does not know the names Initial call taken by: Migdalia Dk,  May 01, 2010 3:00 PM  Follow-up for Phone Call        Called patient and left message on machine for pt to call pharmacy for refills and they will contact us with the medications he needs refills. Follow-up by: Charolotte Capuchin, RN,  May 01, 2010 3:06 PM

## 2010-12-31 ENCOUNTER — Other Ambulatory Visit: Payer: Self-pay | Admitting: Family Medicine

## 2010-12-31 DIAGNOSIS — F419 Anxiety disorder, unspecified: Secondary | ICD-10-CM

## 2011-01-03 NOTE — Telephone Encounter (Signed)
refilled 

## 2011-01-10 ENCOUNTER — Telehealth: Payer: Self-pay | Admitting: Family Medicine

## 2011-01-10 NOTE — Telephone Encounter (Signed)
Pt wants to come in for appt with Dr Scotty Court only.... Adv that he doesn't want to see anyone else... If he can't come in for appt then he wants Rx sent to  [facility] for abx / cough med. Pt # [id number].

## 2011-01-11 NOTE — Telephone Encounter (Signed)
Pt called back to check on status of abx/cough med to [facility]. Pls call something in today and notify pt.

## 2011-02-26 NOTE — Telephone Encounter (Signed)
Called, refilled rx

## 2011-02-26 NOTE — Telephone Encounter (Signed)
called

## 2011-03-27 ENCOUNTER — Encounter: Payer: Self-pay | Admitting: Family Medicine

## 2011-03-27 ENCOUNTER — Ambulatory Visit (INDEPENDENT_AMBULATORY_CARE_PROVIDER_SITE_OTHER): Payer: Medicare Other | Admitting: Family Medicine

## 2011-03-27 VITALS — BP 100/62 | HR 61 | Temp 97.8°F | Wt 193.0 lb

## 2011-03-27 DIAGNOSIS — M1990 Unspecified osteoarthritis, unspecified site: Secondary | ICD-10-CM

## 2011-03-27 DIAGNOSIS — F411 Generalized anxiety disorder: Secondary | ICD-10-CM

## 2011-03-27 DIAGNOSIS — M169 Osteoarthritis of hip, unspecified: Secondary | ICD-10-CM

## 2011-03-27 DIAGNOSIS — G629 Polyneuropathy, unspecified: Secondary | ICD-10-CM

## 2011-03-27 DIAGNOSIS — G4700 Insomnia, unspecified: Secondary | ICD-10-CM

## 2011-03-27 DIAGNOSIS — G589 Mononeuropathy, unspecified: Secondary | ICD-10-CM

## 2011-03-27 MED ORDER — DICLOFENAC SODIUM 75 MG PO TBEC: 75.0000 mg | DELAYED_RELEASE_TABLET | Freq: Two times a day (BID) | ORAL | Status: DC

## 2011-04-02 NOTE — Assessment & Plan Note (Signed)
[name] [name]                            CARDIOLOGY OFFICE NOTE   NAME:[name] [name]                          MRN:          [id number]  DATE:06/03/2007                            DOB:          Feb 20, 1920    PRIMARY:  [name] [name].   REASON FOR PRESENTATION:  Evaluate patient with presumed coronary  disease and mildly reduced ejection fraction.   HISTORY OF PRESENT ILLNESS:  Patient is now 77 years old.  He has done  well from a cardiovascular standpoint since I last saw him.  He did have  one episode of chest discomfort a few weeks ago.  This was after eating  some cold food quickly.  He was not sure whether this was angina.  It  passed after several minutes.  He has not since had any discomfort.  He  is able to work in his yard.  He is under a lot of stress taking care of  an ailing wife and daughter.  He denies any typical chest pressure, neck  or arm discomfort.  He has had no new shortness of breath.  He has had  no PND or orthopnea.  He has had no palpitations, presyncope or syncope.   PAST MEDICAL HISTORY:  1. Hypertension.  2. Depression.  3. 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  4. Tonsillectomy.  5. Colonic polyps.  6. Cholecystectomy.  7. Bilateral inguinal hernia repair.  8. Stress perfusion study demonstrating an EF of 46% with moderate      ischemia in the inferior wall and apex (managed medically).   ALLERGIES:  THE PATIENT HAD AN ANTIBIOTIC REACTION BUT DOES NOT REMEMBER  WHICH ONE.   MEDICATIONS:  1. Aspirin 81 mg daily.  2. ICAPS.  3. Alprazolam.  4. Toprol-XL 50 mg daily.  5. Zocor 40 mg q.h.s.  6. Lisinopril 10 mg daily.   REVIEW OF SYSTEMS:  As stated in the HPI and otherwise negative for  other systems.   PHYSICAL EXAMINATION:  The patient is in no distress.  Blood pressure  129/68.  HEENT:  Eyelids unremarkable, the left pupil is round and reactive, the  right lens is opacified, oral mucosa unremarkable.  NECK:   No jugular venous distention, wave form within normal limits,  carotid upstroke brisk and symmetric, no bruits, no thyromegaly.  LYMPHATICS:  No adenopathy.  LUNGS:  Clear to auscultation bilaterally.  BACK:  No costovertebral angle tenderness.  CHEST:  Unremarkable.  HEART:  PMI not displaced or sustained, S1 and S2 within normal limits,  no S3, no S4, no clicks, no rubs, no murmurs.  ABDOMEN:  Flat, well-healed surgical scar, positive bowel sounds normal  in frequency and pitch, no bruits, no rebound, no guarding, no midline  pulse, no mass, no organomegaly.  SKIN:  No rashes, no nodules.  EXTREMITIES:  Pulses 2+, no edema.   EKG:  Sinus bradycardia, rate 55, right bundle branch block, no acute ST  wave changes, no significant change.   ASSESSMENT AND PLAN:  1. Coronary disease (presumed).  The patient has had no new  symptoms      and still want to pursue medical management.  He did have one      episode of chest discomfort which he thought was gastrointestinal      and it may well have been.  He requests sublingual nitroglycerin.      I think he is on a reasonable medical regimen and is otherwise able      to be active without symptoms.  Therefore, he will continue on that      regimen and will continue with secondary risk reduction.  2. Hypertension.  Blood pressure is well controlled and he will      continue the medications as listed.  3. Risk reduction.  Patient has his lipids followed by [name] [name].      Will check with that office to see if there has been anything drawn      recently.  4. Followup.  He can come back in one year, sooner if needed.     Rollene Rotunda, MD, [facility]  Electronically Signed    JH/MedQ  DD: 06/03/2007  DT: 06/04/2007  Job #: [id number]   cc:   [name] Saba., MD

## 2011-04-02 NOTE — Assessment & Plan Note (Signed)
[facility]                            CARDIOLOGY OFFICE NOTE   NAME:[name] [name], [name] [name]                          MRN:          [id number]  DATE:05/26/2008                            DOB:          May 27, 1920    PRIMARY CARE PHYSICIAN:  Tawny Asal, MD.   REASON FOR PRESENTATION:  The patient with mildly reduced ejection  fraction and with present coronary disease.   HISTORY OF PRESENT ILLNESS:  The patient is now 77 year old.  He has  done well since I last saw him from a cardiovascular standpoint.  He is  still getting around doing his yard work.  He has take care of his  healing daughter and his wife his medical problems or worsening.  This  is fair amount of stress.  With his level of activity, he does not  describe any chest pressure, neck, or arm discomfort.  He has no  palpitation, presyncope, or syncope.  He does not describe PND or  orthopnea.   PAST MEDICAL HISTORY:  Hypertension, depression, loss of vision in his  right eye, tonsillectomy, colonic polyps, cholecystectomy, bilateral  inguinal hernia repair, and stress perfusion study demonstrating an EF  of 46% with moderate ischemia in the inferior wall and apex (this has  been managed medically).   ALLERGIES AND INTOLERANCES:  The patient had antibiotic reaction, but  does remember which one.   CURRENT MEDICATIONS:  Aspirin 81 mg daily, ICaps, alprazolam, Zocor 4 mg  nightly, and lisinopril 10 mg daily.   REVIEW OF SYSTEMS:  As stated in the HPI, otherwise, negative for other  systems.   PHYSICAL EXAMINATION:  GENERAL:  The patient is in no distress.  VITAL SIGNS:  Blood pressure 140/100, heart rate 61 regular, and weight  192 pounds.  HEENT:  Left pupil is reactive, right is dilated, his lenses opacified,  fundi not visualized, and oral mucosa normal.  NECK:  No jugular distention 45 degrees.  Carotid upstroke brisk and  symmetrical.  No bruits and no thyromegaly.  LYMPHATICS:   No adenopathy.  LUNGS:  Clear to auscultation bilaterally.  BACK:  No costovertebral angle tenderness.  CHEST:  Unremarkable.  HEART:  PMI not displaced or sustained, S1-S2 within normal limits.  No  S3, no S4, no clicks, no rubs, and no murmurs.  ABDOMEN:  Flat, well-healed surgical scar, positive bowel sounds, normal  in frequency pitch, and no bruits, no rebound, no guarding or midline  pulsatile mass.  No organomegaly.  SKIN:  No rashes, nonicteric.  EXTREMITIES:  A 2+ pulse throughout.  No edema.   LABORATORY DATA:  EKG sinus rhythm, rate 61, incomplete right bundle-  branch block, premature atrial contraction, and no acute ST-wave  changes.   ASSESSMENT AND PLAN:  1. Coronary artery disease.  The patient has presumed coronary disease      with his stress perfusion results described.  He has had a mildly      reduced ejection fraction.  However, he is having no symptoms.      There is no suggestion of volume overload.  Therefore, I think he      should continue with risk reduction.  I make no change to his      medical regimen at this point.  2. Hypertension, he did not take his blood pressure medicine today, so      his blood pressure is slightly elevated.  He says when he gets a      checked elsewhere, it is within acceptable limits.  I will make no      change to his medicines.  3. Dyslipidemia, we will check a lipid profile today.  He had a      slightly low HDL.  However, his lipids were otherwise fine.  I will      suggest a changes based on the reading today.  4. Follow up, I will see the patient back in 1 year or sooner if      needed.     Rollene Rotunda, MD, [facility]  Electronically Signed    JH/MedQ  DD: 05/26/2008  DT: 05/27/2008  Job #: [id number]   cc:   Ellin Saba., MD

## 2011-04-15 ENCOUNTER — Encounter: Payer: Self-pay | Admitting: Family Medicine

## 2011-04-15 NOTE — Patient Instructions (Signed)
The pain in the inguinal region is not from a hernia but from referred pain from the left hip I have prescribed diclofenac 75 mg twice daily to relieve the discomfort

## 2011-04-15 NOTE — Progress Notes (Signed)
  Subjective:    Patient ID: [name] [name], male    DOB: 05-Sep-1920, 77 y.o.   MRN: [id number] This 77 year old white married male is complaining of pain in the left angle area and also some pain in the left hip area also has arthritis in the shoulders and has problems reaching the deep pain in the left angle region is burning type pain not constant not related to lifting but is noticeable when he walks Blood pressure control Patient complains of stress and anxiety and needs Xanax at times especially at night for insomnia HPI    Review of Systemssee history of present illness negative review of systems    Objective:   Physical Exam Patient is well-developed well-nourished male in no distress Examination of the inguinal regions bilaterally reveals no hernia testicles normal Examination of the hip reveals some tenderness of the left hip especially on movement       Assessment & Plan:  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

## 2011-05-17 ENCOUNTER — Encounter: Payer: Self-pay | Admitting: Cardiology

## 2011-05-20 ENCOUNTER — Telehealth: Payer: Self-pay | Admitting: Cardiology

## 2011-05-20 NOTE — Telephone Encounter (Signed)
Pt will come fasting and have labs drawn once he sees Dr Antoine Poche and orders are obtained.

## 2011-05-20 NOTE — Telephone Encounter (Signed)
Pt want to do labwork while he waits

## 2011-05-21 ENCOUNTER — Encounter: Payer: Self-pay | Admitting: Cardiology

## 2011-05-21 ENCOUNTER — Ambulatory Visit (INDEPENDENT_AMBULATORY_CARE_PROVIDER_SITE_OTHER): Payer: Medicare Other | Admitting: Cardiology

## 2011-05-21 DIAGNOSIS — E785 Hyperlipidemia, unspecified: Secondary | ICD-10-CM

## 2011-05-21 DIAGNOSIS — I428 Other cardiomyopathies: Secondary | ICD-10-CM

## 2011-05-21 DIAGNOSIS — I10 Essential (primary) hypertension: Secondary | ICD-10-CM

## 2011-05-21 DIAGNOSIS — I2510 Atherosclerotic heart disease of native coronary artery without angina pectoris: Secondary | ICD-10-CM

## 2011-05-21 LAB — CBC WITH DIFFERENTIAL/PLATELET
Basophils Absolute: 0 10*3/uL (ref 0.0–0.1)
Eosinophils Absolute: 0.2 10*3/uL (ref 0.0–0.7)
HCT: 42.7 % (ref 39.0–52.0)
Hemoglobin: 14.5 g/dL (ref 13.0–17.0)
Lymphocytes Relative: 21 % (ref 12.0–46.0)
Lymphs Abs: 1.2 10*3/uL (ref 0.7–4.0)
MCHC: 34 g/dL (ref 30.0–36.0)
MCV: 95.2 fl (ref 78.0–100.0)
Monocytes Relative: 9 % (ref 3.0–12.0)
Neutrophils Relative %: 65.8 % (ref 43.0–77.0)
Platelets: 116 10*3/uL — ABNORMAL LOW (ref 150.0–400.0)
RBC: 4.48 Mil/uL (ref 4.22–5.81)
RDW: 14.1 % (ref 11.5–14.6)
WBC: 5.5 10*3/uL (ref 4.5–10.5)

## 2011-05-21 LAB — LIPID PANEL
Cholesterol: 115 mg/dL (ref 0–200)
HDL: 40 mg/dL (ref >39.00)
VLDL: 19.8 mg/dL (ref 0.0–40.0)

## 2011-05-21 LAB — BASIC METABOLIC PANEL
BUN: 38 mg/dL — ABNORMAL HIGH (ref 6–23)
Calcium: 8.6 mg/dL (ref 8.4–10.5)
Chloride: 109 mEq/L (ref 96–112)
Creatinine, Ser: 1.5 mg/dL (ref 0.4–1.5)
GFR: 46.8 mL/min — ABNORMAL LOW (ref >60.00)
Potassium: 5.2 mEq/L — ABNORMAL HIGH (ref 3.5–5.1)

## 2011-05-21 NOTE — Progress Notes (Signed)
HPI  The patient presents for yearly followup of his mildly reduced ejection fraction.  Since I last saw him he has had no new cardiovascular problems. The patient denies any new symptoms such as chest discomfort, neck or arm discomfort. There has been no new shortness of breath, PND or orthopnea. There have been no reported palpitations, presyncope or syncope.  He actually has gotten some eyesight back after cataract surgery.  He is limited somewhat by joint pain.   No Known Allergies  Current Outpatient Prescriptions  Medication Sig Dispense Refill  . aspirin 81 MG tablet Take 81 mg by mouth daily.        . diclofenac (VOLTAREN) 75 MG EC tablet Take 1 tablet (75 mg total) by mouth 2 (two) times daily with a meal. For arthritis  60 tablet  11  . lisinopril (PRINIVIL,ZESTRIL) 10 MG tablet Take 10 mg by mouth daily.        . metoprolol (TOPROL-XL) 50 MG 24 hr tablet Take 50 mg by mouth daily.        . sildenafil (VIAGRA) 50 MG tablet Take 50 mg by mouth daily as needed.        . simvastatin (ZOCOR) 40 MG tablet Take 40 mg by mouth at bedtime.        Marland Kitchen XANAX 1 MG tablet TAKE (1) TABLET TWICE A DAY AS NEEDED.  60 each  5    Past Medical History  Diagnosis Date  . Hypertension   . Hyperlipidemia   . Coronary artery disease   . Cardiomyopathy     Past Surgical History  Procedure Date  . Tonsilectomy, adenoidectomy, bilateral myringotomy and tubes   . Colonic polyps   . Cholecystectomy   . Inguinal hernia repair     ROS:  As stated in the HPI and negative for all other systems.  PHYSICAL EXAM BP 124/62  Pulse 47  Resp 16  Ht 5\' 7"  (1.702 m)  Wt 190 lb (86.183 kg)  BMI 29.76 kg/m2 GENERAL:  Well appearing HEENT:  Right lens opacification, fundi not visualized, oral mucosa unremarkable NECK:  No jugular venous distention, waveform within normal limits, carotid upstroke brisk and symmetric, no bruits, no thyromegaly LYMPHATICS:  No cervical, inguinal adenopathy LUNGS:  Clear to  auscultation bilaterally BACK:  No CVA tenderness CHEST:  Unremarkable HEART:  PMI not displaced or sustained,S1 and S2 within normal limits, no S3, no S4, no clicks, no rubs, no murmurs ABD:  Flat, positive bowel sounds normal in frequency in pitch, no bruits, no rebound, no guarding, no midline pulsatile mass, no hepatomegaly, no splenomegaly EXT:  2 plus pulses throughout, no edema, no cyanosis no clubbing SKIN:  No rashes no nodules NEURO:  Cranial nerves II through XII grossly intact, motor grossly intact throughout PSYCH:  Cognitively intact, oriented to person place and time  EKG:  Bradycardia with sinus arrhythmia, right bundle branch block, no acute ST-T wave changes  ASSESSMENT AND PLAN

## 2011-05-21 NOTE — Patient Instructions (Signed)
Please have fasting lab work today Continue current medications as listed Follow up in 1 year with Dr Antoine Poche.  You will receive a letter in the mail 2 months before you are due.  Please call us when you receive this letter to schedule your follow up appointment.

## 2011-05-21 NOTE — Assessment & Plan Note (Signed)
I will check a lipid profile  

## 2011-05-21 NOTE — Assessment & Plan Note (Signed)
He seems to be euvolemic.  At this point, no change in therapy is indicated.  We have reviewed salt and fluid restrictions.  No further cardiovascular testing is indicated.  No further imaging is indicated at this point.

## 2011-05-21 NOTE — Assessment & Plan Note (Signed)
He did have an abnormal stress test in the past but no active symptoms and we are managing this medically.  His EF in the past was 46%.

## 2011-05-28 NOTE — Progress Notes (Signed)
Addended by: Sharin Grave on: 05/28/2011 05:33 PM   Modules accepted: Orders

## 2011-05-30 ENCOUNTER — Other Ambulatory Visit: Payer: Medicare Other | Admitting: *Deleted

## 2011-06-04 ENCOUNTER — Other Ambulatory Visit (INDEPENDENT_AMBULATORY_CARE_PROVIDER_SITE_OTHER): Payer: Medicare Other | Admitting: *Deleted

## 2011-06-04 DIAGNOSIS — I10 Essential (primary) hypertension: Secondary | ICD-10-CM

## 2011-06-04 DIAGNOSIS — I428 Other cardiomyopathies: Secondary | ICD-10-CM

## 2011-06-04 LAB — BASIC METABOLIC PANEL
BUN: 34 mg/dL — ABNORMAL HIGH (ref 6–23)
Creatinine, Ser: 1.3 mg/dL (ref 0.4–1.5)

## 2011-07-31 ENCOUNTER — Ambulatory Visit (INDEPENDENT_AMBULATORY_CARE_PROVIDER_SITE_OTHER): Payer: Medicare Other | Admitting: Surgery

## 2011-07-31 ENCOUNTER — Encounter (INDEPENDENT_AMBULATORY_CARE_PROVIDER_SITE_OTHER): Payer: Self-pay | Admitting: Surgery

## 2011-07-31 DIAGNOSIS — K649 Unspecified hemorrhoids: Secondary | ICD-10-CM

## 2011-07-31 NOTE — Progress Notes (Signed)
Mr. Brislin is Rosanne Ashing Keelin's father(Helen).  He was a IT consultant in [place] and looks younger than his stated age of 65. He has had some episodes of hemorrhoidal bleeding that occurred in the last 2 months. He associated disease with being very flatulent and with straining to pass gas. Recently however he has not had any bleeding to speak of.  On rectal exam I do not feel any rectal masses. He has had a colonoscopy in the past by Victorino Dike. On anoscopy he has some internal hemorrhoids but they don't seem friable and are not prolapsing. I could then does at some point if necessary. In the near term I think we should try avoiding foods that cause him to be gaseous and perhaps use Beano.  I will see him back in 3 months and see if this is a continued problem or if he has any bleeding episodes like to see him then.  Impression history of bleeding internal hemorrhoids currently not flared up. We'll observe and consider banding if a post another problem in the future.

## 2011-08-05 ENCOUNTER — Ambulatory Visit (INDEPENDENT_AMBULATORY_CARE_PROVIDER_SITE_OTHER): Payer: Medicare Other | Admitting: Internal Medicine

## 2011-08-05 DIAGNOSIS — Z23 Encounter for immunization: Secondary | ICD-10-CM

## 2011-08-05 DIAGNOSIS — Z0000 Encounter for general adult medical examination without abnormal findings: Secondary | ICD-10-CM

## 2011-10-01 ENCOUNTER — Other Ambulatory Visit: Payer: Self-pay | Admitting: Cardiology

## 2011-10-03 ENCOUNTER — Encounter (INDEPENDENT_AMBULATORY_CARE_PROVIDER_SITE_OTHER): Payer: Self-pay | Admitting: Surgery

## 2011-10-16 ENCOUNTER — Ambulatory Visit: Payer: Medicare Other | Admitting: Internal Medicine

## 2011-11-14 ENCOUNTER — Encounter (INDEPENDENT_AMBULATORY_CARE_PROVIDER_SITE_OTHER): Payer: Self-pay | Admitting: Surgery

## 2011-11-14 ENCOUNTER — Ambulatory Visit (INDEPENDENT_AMBULATORY_CARE_PROVIDER_SITE_OTHER): Payer: Medicare Other | Admitting: Surgery

## 2011-11-14 VITALS — BP 156/90 | HR 72 | Temp 96.5°F | Resp 18 | Ht 67.5 in | Wt 192.4 lb

## 2011-11-14 DIAGNOSIS — K648 Other hemorrhoids: Secondary | ICD-10-CM | POA: Insufficient documentation

## 2011-11-14 NOTE — Progress Notes (Signed)
[name] [name] comes in today with bleeding from his hemorrhoids. On anoscopy he had a small hemorrhoid on the right side a large strawberry-looking hemorrhoid on the left side. I went ahead and double banded hemorrhoid on the left side. He tolerated that well. I will see him back in 2 months.

## 2011-11-14 NOTE — Patient Instructions (Signed)
Expect some bleeding as the banded hemorrhoid sloughs

## 2011-11-28 ENCOUNTER — Other Ambulatory Visit (INDEPENDENT_AMBULATORY_CARE_PROVIDER_SITE_OTHER): Payer: Medicare Other

## 2011-11-28 ENCOUNTER — Ambulatory Visit: Payer: Medicare Other | Admitting: Internal Medicine

## 2011-11-28 ENCOUNTER — Encounter: Payer: Self-pay | Admitting: Internal Medicine

## 2011-11-28 ENCOUNTER — Ambulatory Visit (INDEPENDENT_AMBULATORY_CARE_PROVIDER_SITE_OTHER): Payer: Medicare Other | Admitting: Internal Medicine

## 2011-11-28 VITALS — BP 160/90 | HR 80 | Temp 97.0°F | Resp 16 | Ht 68.0 in | Wt 194.0 lb

## 2011-11-28 DIAGNOSIS — Z125 Encounter for screening for malignant neoplasm of prostate: Secondary | ICD-10-CM

## 2011-11-28 DIAGNOSIS — N320 Bladder-neck obstruction: Secondary | ICD-10-CM

## 2011-11-28 DIAGNOSIS — F419 Anxiety disorder, unspecified: Secondary | ICD-10-CM

## 2011-11-28 DIAGNOSIS — L570 Actinic keratosis: Secondary | ICD-10-CM

## 2011-11-28 DIAGNOSIS — Z0000 Encounter for general adult medical examination without abnormal findings: Secondary | ICD-10-CM

## 2011-11-28 DIAGNOSIS — M1990 Unspecified osteoarthritis, unspecified site: Secondary | ICD-10-CM

## 2011-11-28 DIAGNOSIS — I2510 Atherosclerotic heart disease of native coronary artery without angina pectoris: Secondary | ICD-10-CM

## 2011-11-28 DIAGNOSIS — D485 Neoplasm of uncertain behavior of skin: Secondary | ICD-10-CM

## 2011-11-28 DIAGNOSIS — I428 Other cardiomyopathies: Secondary | ICD-10-CM

## 2011-11-28 DIAGNOSIS — I10 Essential (primary) hypertension: Secondary | ICD-10-CM

## 2011-11-28 DIAGNOSIS — F411 Generalized anxiety disorder: Secondary | ICD-10-CM

## 2011-11-28 LAB — COMPREHENSIVE METABOLIC PANEL
ALT: 19 U/L (ref 0–53)
AST: 21 U/L (ref 0–37)
Alkaline Phosphatase: 117 U/L (ref 39–117)
Calcium: 8.7 mg/dL (ref 8.4–10.5)
Chloride: 105 mEq/L (ref 96–112)
Creatinine, Ser: 1.2 mg/dL (ref 0.4–1.5)
Glucose, Bld: 84 mg/dL (ref 70–99)
Total Bilirubin: 1.4 mg/dL — ABNORMAL HIGH (ref 0.3–1.2)
Total Protein: 6.4 g/dL (ref 6.0–8.3)

## 2011-11-28 LAB — CBC WITH DIFFERENTIAL/PLATELET
Basophils Relative: 0.4 % (ref 0.0–3.0)
Eosinophils Absolute: 0.2 10*3/uL (ref 0.0–0.7)
Eosinophils Relative: 3.6 % (ref 0.0–5.0)
HCT: 43.9 % (ref 39.0–52.0)
Hemoglobin: 15 g/dL (ref 13.0–17.0)
Lymphs Abs: 1 10*3/uL (ref 0.7–4.0)
MCHC: 34.2 g/dL (ref 30.0–36.0)
MCV: 94.1 fl (ref 78.0–100.0)
Neutro Abs: 3.3 10*3/uL (ref 1.4–7.7)

## 2011-11-28 LAB — TSH: TSH: 2.3 u[IU]/mL (ref 0.35–5.50)

## 2011-11-28 LAB — LIPID PANEL: Cholesterol: 153 mg/dL (ref 0–200)

## 2011-11-28 LAB — URINALYSIS
Bilirubin Urine: NEGATIVE
Hgb urine dipstick: NEGATIVE
Nitrite: NEGATIVE
Specific Gravity, Urine: 1.025 (ref 1.000–1.030)

## 2011-11-28 MED ORDER — METOPROLOL SUCCINATE ER 50 MG PO TB24: 50.0000 mg | ORAL_TABLET | Freq: Every day | ORAL | Status: DC

## 2011-11-28 MED ORDER — DICLOFENAC SODIUM 75 MG PO TBEC: 75.0000 mg | DELAYED_RELEASE_TABLET | Freq: Two times a day (BID) | ORAL | Status: DC

## 2011-11-28 MED ORDER — VITAMIN D 1000 UNITS PO TABS: 1000.0000 [IU] | ORAL_TABLET | Freq: Every day | ORAL | Status: AC

## 2011-11-28 MED ORDER — LISINOPRIL 10 MG PO TABS: 10.0000 mg | ORAL_TABLET | Freq: Two times a day (BID) | ORAL | Status: DC

## 2011-11-28 MED ORDER — SIMVASTATIN 40 MG PO TABS: 20.0000 mg | ORAL_TABLET | Freq: Every day | ORAL | Status: DC

## 2011-11-28 MED ORDER — ALPRAZOLAM 1 MG PO TABS: 1.0000 mg | ORAL_TABLET | Freq: Two times a day (BID) | ORAL | Status: DC | PRN

## 2011-11-28 NOTE — Assessment & Plan Note (Signed)
Continue with current prescription therapy as reflected on the Med list.  

## 2011-11-28 NOTE — Assessment & Plan Note (Signed)
BP Readings from Last 3 Encounters:  11/28/11 160/90  11/14/11 156/90  07/31/11 138/70  Will adjust meds

## 2011-11-28 NOTE — Assessment & Plan Note (Signed)
Will cryo  

## 2011-11-28 NOTE — Assessment & Plan Note (Signed)
The patient is here for annual Medicare wellness examination and management of other chronic and acute problems.   The risk factors are reflected in the social history.  The roster of all physicians providing medical care to patient - is listed in the Snapshot section of the chart.  Activities of daily living:  The patient is 100% inedpendent in all ADLs: dressing, toileting, feeding as well as independent mobility  Home safety : The patient has smoke detectors in the home. They wear seatbelts.No firearms at home ( firearms are present in the home, kept in a safe fashion). There is no violence in the home.   There is no risks for hepatitis, STDs or HIV. There is no   history of blood transfusion. They have no travel history to infectious disease endemic areas of the world.  The patient has (has not) seen their dentist in the last six month. They have (not) seen their eye doctor in the last year. They deny (admit to) any hearing difficulty and have not had audiologic testing in the last year.  They do not  have excessive sun exposure. Discussed the need for sun protection: hats, long sleeves and use of sunscreen if there is significant sun exposure.   Diet: the importance of a healthy diet is discussed. They do have a healthy (unhealthy-high fat/fast food) diet.  The patient has a regular exercise program: housework.  The benefits of regular aerobic exercise were discussed.  Depression screen: there are no signs or vegative symptoms of depression- irritability, change in appetite, anhedonia, sadness/tearfullness.  Cognitive assessment: the patient manages all their financial and personal affairs and is actively engaged. They could relate day,date,year and events; recalled 3/3 objects at 3 minutes; performed clock-face test normally.  The following portions of the patient's history were reviewed and updated as appropriate: allergies, current medications, past family history, past medical history,   past surgical history, past social history  and problem list.  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   During the course of the visit the patient was educated and counseled about appropriate screening and preventive services including : fall prevention , diabetes screening, nutrition counseling, colorectal cancer screening, and recommended immunizations.

## 2011-11-28 NOTE — Progress Notes (Signed)
  Subjective:    Patient ID: [name] [name], male    DOB: [birth year], 78 y.o.   MRN: [id number]  HPI New patient  The patient is here for a wellness exam. The patient has been doing well overall without major physical or psychological issues going on lately. The patient needs to address  chronic hypertension that has not  been well controlled with medicines; to address chronic  hyperlipidemia controlled with medicines ; and to address OA, controlled with medical treatment and diet.  C/o skin lesions     Review of Systems  Constitutional: Negative for appetite change, fatigue and unexpected weight change.  HENT: Negative for nosebleeds, congestion, sore throat, sneezing, trouble swallowing and neck pain.   Eyes: Positive for visual disturbance. Negative for itching.  Respiratory: Negative for cough and shortness of breath.   Cardiovascular: Negative for chest pain, palpitations and leg swelling.  Gastrointestinal: Negative for nausea, diarrhea, blood in stool and abdominal distention.  Genitourinary: Negative for frequency and hematuria.  Musculoskeletal: Positive for gait problem. Negative for back pain and joint swelling.  Skin: Negative for rash.  Neurological: Negative for dizziness, tremors, speech difficulty and weakness.  Psychiatric/Behavioral: Negative for sleep disturbance, dysphoric mood and agitation. The patient is not nervous/anxious.        Objective:   Physical Exam  Constitutional: He is oriented to person, place, and time. He appears well-developed.  HENT:  Mouth/Throat: Oropharynx is clear and moist.  Eyes: Conjunctivae are normal. Pupils are equal, round, and reactive to light.       R eye is scarred  Neck: Normal range of motion. No JVD present. No thyromegaly present.  Cardiovascular: Normal rate and intact distal pulses.  Exam reveals no gallop and no friction rub.   Murmur (1/6 m) heard. Pulmonary/Chest: Effort normal and breath sounds normal. No respiratory  distress. He has no wheezes. He has no rales. He exhibits no tenderness.  Abdominal: Soft. Bowel sounds are normal. He exhibits no distension and no mass. There is no tenderness. There is no rebound and no guarding.  Musculoskeletal: Normal range of motion. He exhibits no edema and no tenderness.  Lymphadenopathy:    He has no cervical adenopathy.  Neurological: He is alert and oriented to person, place, and time. He has normal reflexes. No cranial nerve deficit. He exhibits normal muscle tone. Coordination normal.  Skin: Skin is warm and dry. Rash (L dist wrist - ?ca, large; AKs on scalp) noted. He is not diaphoretic.  Psychiatric: He has a normal mood and affect. His behavior is normal. Judgment and thought content normal.  R hip is a little tender  Lab Results  Component Value Date   WBC 5.5 05/21/2011   HGB 14.5 05/21/2011   HCT 42.7 05/21/2011   PLT 116.0* 05/21/2011   GLUCOSE 98 06/04/2011   CHOL 115 05/21/2011   TRIG 99.0 05/21/2011   HDL 40.00 05/21/2011   LDLCALC 55 05/21/2011   ALT 18 06/04/2010   AST 27 06/04/2010   NA 139 06/04/2011   K 5.1 06/04/2011   CL 111 06/04/2011   CREATININE 1.3 06/04/2011   BUN 34* 06/04/2011   CO2 24 06/04/2011   PSA 0.30 06/05/2009         Assessment & Plan:

## 2011-11-28 NOTE — Assessment & Plan Note (Signed)
Skin bx 

## 2011-12-04 ENCOUNTER — Telehealth: Payer: Self-pay | Admitting: Cardiology

## 2011-12-04 DIAGNOSIS — Z79899 Other long term (current) drug therapy: Secondary | ICD-10-CM

## 2011-12-04 DIAGNOSIS — I10 Essential (primary) hypertension: Secondary | ICD-10-CM

## 2011-12-04 NOTE — Telephone Encounter (Signed)
New Problem   Patient has medication concerns plz return call on hm#

## 2011-12-04 NOTE — Telephone Encounter (Signed)
Line busy, will continue to attempt to contact

## 2011-12-05 ENCOUNTER — Other Ambulatory Visit: Payer: Self-pay | Admitting: *Deleted

## 2011-12-05 MED ORDER — SIMVASTATIN 40 MG PO TABS: 20.0000 mg | ORAL_TABLET | Freq: Every day | ORAL | Status: DC

## 2011-12-05 NOTE — Telephone Encounter (Signed)
Pt changed PCP and they increased Lisinopril to 2 tablets a day (he doesn't know the dosage) and he wants to know if this is OK.  OK to increase medication as ordered.  Pt aware he will need lab in 7 to 10  Days (BMP)

## 2011-12-11 ENCOUNTER — Other Ambulatory Visit (INDEPENDENT_AMBULATORY_CARE_PROVIDER_SITE_OTHER): Payer: Medicare Other | Admitting: *Deleted

## 2011-12-11 DIAGNOSIS — I10 Essential (primary) hypertension: Secondary | ICD-10-CM

## 2011-12-11 DIAGNOSIS — Z79899 Other long term (current) drug therapy: Secondary | ICD-10-CM

## 2011-12-11 LAB — BASIC METABOLIC PANEL
BUN: 27 mg/dL — ABNORMAL HIGH (ref 6–23)
CO2: 27 mEq/L (ref 19–32)
Creatinine, Ser: 1.1 mg/dL (ref 0.4–1.5)
Glucose, Bld: 98 mg/dL (ref 70–99)

## 2011-12-12 ENCOUNTER — Other Ambulatory Visit: Payer: Medicare Other | Admitting: *Deleted

## 2012-02-11 ENCOUNTER — Other Ambulatory Visit: Payer: Self-pay | Admitting: Cardiology

## 2012-02-12 ENCOUNTER — Telehealth: Payer: Self-pay

## 2012-02-12 DIAGNOSIS — F419 Anxiety disorder, unspecified: Secondary | ICD-10-CM

## 2012-02-12 NOTE — Telephone Encounter (Signed)
Rx received requesting refill of Alprazolam. Last Rx - 11/28/11 # 60 x 5 was called into [facility], pt now using [facility], please advise.

## 2012-02-12 NOTE — Telephone Encounter (Signed)
Ok x3 ref [employer]

## 2012-02-13 MED ORDER — ALPRAZOLAM 1 MG PO TABS: 1.0000 mg | ORAL_TABLET | Freq: Two times a day (BID) | ORAL | Status: DC | PRN

## 2012-03-02 ENCOUNTER — Encounter: Payer: Self-pay | Admitting: Internal Medicine

## 2012-03-02 ENCOUNTER — Ambulatory Visit (INDEPENDENT_AMBULATORY_CARE_PROVIDER_SITE_OTHER): Payer: Medicare Other | Admitting: Internal Medicine

## 2012-03-02 ENCOUNTER — Telehealth: Payer: Self-pay | Admitting: Cardiology

## 2012-03-02 VITALS — BP 130/78 | HR 84 | Temp 96.8°F | Resp 16 | Wt 191.0 lb

## 2012-03-02 DIAGNOSIS — R059 Cough, unspecified: Secondary | ICD-10-CM

## 2012-03-02 DIAGNOSIS — R05 Cough: Secondary | ICD-10-CM

## 2012-03-02 DIAGNOSIS — T464X5A Adverse effect of angiotensin-converting-enzyme inhibitors, initial encounter: Secondary | ICD-10-CM

## 2012-03-02 DIAGNOSIS — I2510 Atherosclerotic heart disease of native coronary artery without angina pectoris: Secondary | ICD-10-CM

## 2012-03-02 DIAGNOSIS — I10 Essential (primary) hypertension: Secondary | ICD-10-CM

## 2012-03-02 DIAGNOSIS — I428 Other cardiomyopathies: Secondary | ICD-10-CM

## 2012-03-02 DIAGNOSIS — T44905A Adverse effect of unspecified drugs primarily affecting the autonomic nervous system, initial encounter: Secondary | ICD-10-CM

## 2012-03-02 DIAGNOSIS — F341 Dysthymic disorder: Secondary | ICD-10-CM

## 2012-03-02 DIAGNOSIS — M1990 Unspecified osteoarthritis, unspecified site: Secondary | ICD-10-CM

## 2012-03-02 MED ORDER — LOSARTAN POTASSIUM-HCTZ 100-12.5 MG PO TABS: 1.0000 | ORAL_TABLET | Freq: Every day | ORAL | Status: DC

## 2012-03-02 NOTE — Progress Notes (Signed)
Patient ID: [name] [name], male   DOB: 1920/02/02, 78 y.o.   MRN: [id number]  Subjective:    Patient ID: [name] [name], male    DOB: 11-13-1920, 78 y.o.   MRN: [id number]  HPI   The patient needs to address  chronic hypertension that has not  been well controlled with medicines; to address chronic  HTN, hyperlipidemia controlled with medicines ; and to address OA, controlled with medical treatment and diet.  C/o skin lesions  C/o B shoulder pain and ROM restriction. C/o R knee pain C/o B ankle edema   BP Readings from Last 3 Encounters:  03/02/12 130/78  11/28/11 160/90  11/14/11 156/90   Wt Readings from Last 3 Encounters:  03/02/12 191 lb (86.637 kg)  11/28/11 194 lb (87.998 kg)  11/14/11 192 lb 6.4 oz (87.272 kg)      Review of Systems  Constitutional: Negative for appetite change, fatigue and unexpected weight change.  HENT: Negative for nosebleeds, congestion, sore throat, sneezing, trouble swallowing and neck pain.   Eyes: Positive for visual disturbance. Negative for itching.  Respiratory: Negative for cough and shortness of breath.   Cardiovascular: Negative for chest pain, palpitations and leg swelling.  Gastrointestinal: Negative for nausea, diarrhea, blood in stool and abdominal distention.  Genitourinary: Negative for frequency and hematuria.  Musculoskeletal: Positive for gait problem. Negative for back pain and joint swelling.  Skin: Negative for rash.  Neurological: Negative for dizziness, tremors, speech difficulty and weakness.  Psychiatric/Behavioral: Negative for sleep disturbance, dysphoric mood and agitation. The patient is not nervous/anxious.        Objective:   Physical Exam  Constitutional: He is oriented to person, place, and time. He appears well-developed.  HENT:  Mouth/Throat: Oropharynx is clear and moist.  Eyes: Conjunctivae are normal. Pupils are equal, round, and reactive to light.       R eye is scarred  Neck: Normal range of motion. No  JVD present. No thyromegaly present.  Cardiovascular: Normal rate and intact distal pulses.  Exam reveals no gallop and no friction rub.   Murmur (1/6 m) heard. Pulmonary/Chest: Effort normal and breath sounds normal. No respiratory distress. He has no wheezes. He has no rales. He exhibits no tenderness.  Abdominal: Soft. Bowel sounds are normal. He exhibits no distension and no mass. There is no tenderness. There is no rebound and no guarding.  Musculoskeletal: Normal range of motion. He exhibits no edema and no tenderness.  Lymphadenopathy:    He has no cervical adenopathy.  Neurological: He is alert and oriented to person, place, and time. He has normal reflexes. No cranial nerve deficit. He exhibits normal muscle tone. Coordination normal.  Skin: Skin is warm and dry. Rash (L dist wrist - ?ca, large; AKs on scalp) noted. He is not diaphoretic.  Psychiatric: He has a normal mood and affect. His behavior is normal. Judgment and thought content normal.  R hip is a little tender  Lab Results  Component Value Date   WBC 5.0 11/28/2011   HGB 15.0 11/28/2011   HCT 43.9 11/28/2011   PLT 126.0* 11/28/2011   GLUCOSE 98 12/11/2011   CHOL 153 11/28/2011   TRIG 115.0 11/28/2011   HDL 36.00* 11/28/2011   LDLCALC 94 11/28/2011   ALT 19 11/28/2011   AST 21 11/28/2011   NA 140 12/11/2011   K 4.8 12/11/2011   CL 105 12/11/2011   CREATININE 1.1 12/11/2011   BUN 27* 12/11/2011   CO2 27 12/11/2011   TSH 2.30  11/28/2011   PSA 0.25 11/28/2011         Assessment & Plan:

## 2012-03-02 NOTE — Assessment & Plan Note (Signed)
Continue with current prescription therapy as reflected on the Med list.  

## 2012-03-02 NOTE — Assessment & Plan Note (Addendum)
Continue with current prescription therapy as reflected on the Med list.  Chronic   Potential benefits of a long term NSAID use as well as potential risks  and complications were explained to the patient and were aknowledged. He wants to cont 1/13, 4/13

## 2012-03-02 NOTE — Assessment & Plan Note (Addendum)
Change to Hyzaar 

## 2012-03-02 NOTE — Telephone Encounter (Signed)
Pt was put on Lisinopril by Dr. Antoine Poche and his pcp took him off and changed it to Hyzaar and pt wants to make sure that is ok

## 2012-03-02 NOTE — Assessment & Plan Note (Signed)
Chronic - not on meds

## 2012-03-02 NOTE — Telephone Encounter (Signed)
Left message with patients wife for call back.

## 2012-03-02 NOTE — Telephone Encounter (Signed)
Pt rtn call, pt will try her back

## 2012-03-02 NOTE — Assessment & Plan Note (Signed)
See meds 

## 2012-03-02 NOTE — Telephone Encounter (Signed)
Called patient and he advised me that his PCP stopped his Lisinopril because of some coughing and started him on Hyzaar 100/12.5 mg. Advised will let Dr.Hochrein know and if he had any concerns he would receive a call back.

## 2012-03-03 NOTE — Telephone Encounter (Signed)
Med change OK.

## 2012-03-30 ENCOUNTER — Telehealth: Payer: Self-pay | Admitting: Cardiology

## 2012-03-30 NOTE — Telephone Encounter (Signed)
Please return call to patient at [phone] occasional dizzy spells over the last couple of weeks, No SOB at this time would just like to speak with nurse Pam.

## 2012-03-30 NOTE — Telephone Encounter (Signed)
Spoke with pt who reports for the past 2 to 3 weeks he has been having increased dizziness with position changes since his Lisinopril was stopped due to cough and he was started on Hyzaar 100-12.5 mg a day by Dr Sinclair Grooms.  Pt would like for Dr Antoine Poche to review

## 2012-04-01 NOTE — Telephone Encounter (Signed)
Reduce to 50 - 12.5 Hyzaar daily.

## 2012-04-02 MED ORDER — LOSARTAN POTASSIUM-HCTZ 50-12.5 MG PO TABS: 1.0000 | ORAL_TABLET | Freq: Every day | ORAL | Status: DC

## 2012-04-02 NOTE — Telephone Encounter (Signed)
Pt aware of orders to decrease Hyzaar to 50-12.5 mg a day.  rx will be sent into pharmacy of his choice.

## 2012-05-10 ENCOUNTER — Other Ambulatory Visit: Payer: Self-pay | Admitting: Family Medicine

## 2012-05-26 ENCOUNTER — Ambulatory Visit (INDEPENDENT_AMBULATORY_CARE_PROVIDER_SITE_OTHER): Payer: Medicare Other | Admitting: Cardiology

## 2012-05-26 ENCOUNTER — Encounter: Payer: Self-pay | Admitting: Cardiology

## 2012-05-26 VITALS — BP 150/70 | HR 52 | Ht 67.0 in | Wt 184.8 lb

## 2012-05-26 DIAGNOSIS — I428 Other cardiomyopathies: Secondary | ICD-10-CM

## 2012-05-26 DIAGNOSIS — I2510 Atherosclerotic heart disease of native coronary artery without angina pectoris: Secondary | ICD-10-CM

## 2012-05-26 DIAGNOSIS — E785 Hyperlipidemia, unspecified: Secondary | ICD-10-CM

## 2012-05-26 DIAGNOSIS — I10 Essential (primary) hypertension: Secondary | ICD-10-CM

## 2012-05-26 MED ORDER — LOSARTAN POTASSIUM 25 MG PO TABS: 25.0000 mg | ORAL_TABLET | Freq: Every day | ORAL | Status: DC

## 2012-05-26 NOTE — Assessment & Plan Note (Signed)
I went back and reviewed multiple blood pressures. They have been consistently elevated with systolics in the 150s. I'm going to at 25 mg of Cozaar at night check a basic metabolic profile in one week.

## 2012-05-26 NOTE — Progress Notes (Signed)
   HPI  The patient presents for yearly followup of his mildly reduced ejection fraction.  He has presumed coronary disease with ischemia on a stress perfusion study in 2008. We have managed this medically. Over the past year he's had some with orthopedic issues.  He did have a fall. He's not however describing any new shortness of breath, PND or orthopnea. He will rarely get some dyspnea. He has had about 3 episodes of brief chest pain in the past year but can't bring this on with activities. He still gardening. He takes care of his wife who has medical issues. He does have joint problems not allowing him to lift his arms above his head but can do work below his waist.   Allergies  Allergen Reactions  . Lisinopril     cough    Current Outpatient Prescriptions  Medication Sig Dispense Refill  . ALPRAZolam (XANAX) 1 MG tablet Take 1 tablet (1 mg total) by mouth 2 (two) times daily as needed for sleep or anxiety.  60 tablet  2  . aspirin 81 MG tablet Take 81 mg by mouth daily.        . cholecalciferol (VITAMIN D) 1000 UNITS tablet Take 1 tablet (1,000 Units total) by mouth daily.  100 tablet  3  . losartan-hydrochlorothiazide (HYZAAR) 50-12.5 MG per tablet Take 1 tablet by mouth daily.  90 tablet  3  . metoprolol (TOPROL-XL) 50 MG 24 hr tablet Take 1 tablet (50 mg total) by mouth daily.  90 tablet  3  . sildenafil (VIAGRA) 50 MG tablet Take 50 mg by mouth daily as needed.        . simvastatin (ZOCOR) 40 MG tablet Take 0.5 tablets (20 mg total) by mouth at bedtime.  90 tablet  3  . VOLTAREN 75 MG EC tablet TAKE 1 TABLET TWICE DAILY WITH FOOD.  60 each  2    Past Medical History  Diagnosis Date  . Hypertension   . Hyperlipidemia   . Coronary artery disease   . Cardiomyopathy   . Hemorrhoids     Past Surgical History  Procedure Date  . Tonsilectomy, adenoidectomy, bilateral myringotomy and tubes   . Colonic polyps   . Cholecystectomy   . Inguinal hernia repair     ROS:  As stated  in the HPI and negative for all other systems.  PHYSICAL EXAM BP 150/70  Pulse 52  Ht 5\' 7"  (1.702 m)  Wt 184 lb 12.8 oz (83.825 kg)  BMI 28.94 kg/m2 GENERAL:  Well appearing HEENT:  Right lens opacification, fundi not visualized, oral mucosa unremarkable NECK:  No jugular venous distention, waveform within normal limits, carotid upstroke brisk and symmetric, no bruits, no thyromegaly LYMPHATICS:  No cervical, inguinal adenopathy LUNGS:  Clear to auscultation bilaterally BACK:  No CVA tenderness CHEST:  Unremarkable HEART:  PMI not displaced or sustained,S1 and S2 within normal limits, no S3, no S4, no clicks, no rubs, no murmurs ABD:  Flat, positive bowel sounds normal in frequency in pitch, no bruits, no rebound, no guarding, no midline pulsatile mass, no hepatomegaly, no splenomegaly EXT:  2 plus pulses throughout, no edema, no cyanosis no clubbing SKIN:  No rashes no nodules NEURO:  Cranial nerves II through XII grossly intact, motor grossly intact throughout, muscle wasting  PSYCH:  Cognitively intact, oriented to person place and time  EKG:  Bradycardia rate 52, right bundle branch block, no acute ST-T wave changes.  05/26/2012   ASSESSMENT AND PLAN

## 2012-05-26 NOTE — Assessment & Plan Note (Signed)
In January his LDL was 94 with an HDL of 36. I think this is acceptable level. No change in therapy is indicated.

## 2012-05-26 NOTE — Assessment & Plan Note (Signed)
The patient has no new sypmtoms.  No further cardiovascular testing is indicated.  We will continue with aggressive risk reduction and meds as listed.  

## 2012-05-26 NOTE — Patient Instructions (Addendum)
Please start Losartan 25 mg at bedtime Continue all other medications as listed.  Please have blood work at your next appointment with Dr Posey Rea. (BMP)  Follow up in 1 year with Dr Antoine Poche.  You will receive a letter in the mail 2 months before you are due.  Please call us when you receive this letter to schedule your follow up appointment.

## 2012-05-26 NOTE — Assessment & Plan Note (Signed)
He seems to be euvolemic.  At this point, no change in therapy is indicated.  We have reviewed salt and fluid restrictions.  No further cardiovascular testing is indicated.   

## 2012-05-28 ENCOUNTER — Telehealth: Payer: Self-pay | Admitting: Cardiology

## 2012-05-28 NOTE — Telephone Encounter (Signed)
Error

## 2012-06-01 ENCOUNTER — Ambulatory Visit (INDEPENDENT_AMBULATORY_CARE_PROVIDER_SITE_OTHER): Payer: Medicare Other | Admitting: Internal Medicine

## 2012-06-01 ENCOUNTER — Encounter: Payer: Self-pay | Admitting: Internal Medicine

## 2012-06-01 ENCOUNTER — Other Ambulatory Visit (INDEPENDENT_AMBULATORY_CARE_PROVIDER_SITE_OTHER): Payer: Medicare Other

## 2012-06-01 VITALS — BP 130/82 | HR 84 | Temp 97.1°F | Resp 16 | Wt 186.0 lb

## 2012-06-01 DIAGNOSIS — R059 Cough, unspecified: Secondary | ICD-10-CM

## 2012-06-01 DIAGNOSIS — D485 Neoplasm of uncertain behavior of skin: Secondary | ICD-10-CM

## 2012-06-01 DIAGNOSIS — L570 Actinic keratosis: Secondary | ICD-10-CM

## 2012-06-01 DIAGNOSIS — T44905A Adverse effect of unspecified drugs primarily affecting the autonomic nervous system, initial encounter: Secondary | ICD-10-CM

## 2012-06-01 DIAGNOSIS — F341 Dysthymic disorder: Secondary | ICD-10-CM

## 2012-06-01 DIAGNOSIS — I10 Essential (primary) hypertension: Secondary | ICD-10-CM

## 2012-06-01 DIAGNOSIS — R05 Cough: Secondary | ICD-10-CM

## 2012-06-01 DIAGNOSIS — T464X5A Adverse effect of angiotensin-converting-enzyme inhibitors, initial encounter: Secondary | ICD-10-CM

## 2012-06-01 DIAGNOSIS — E785 Hyperlipidemia, unspecified: Secondary | ICD-10-CM

## 2012-06-01 LAB — BASIC METABOLIC PANEL
CO2: 25 mEq/L (ref 19–32)
Chloride: 108 mEq/L (ref 96–112)
Potassium: 5.3 mEq/L — ABNORMAL HIGH (ref 3.5–5.1)
Sodium: 140 mEq/L (ref 135–145)

## 2012-06-01 NOTE — Assessment & Plan Note (Signed)
Continue with current prescription therapy as reflected on the Med list.  

## 2012-06-01 NOTE — Assessment & Plan Note (Signed)
Better off ACE 

## 2012-06-01 NOTE — Assessment & Plan Note (Signed)
bx advised

## 2012-06-01 NOTE — Progress Notes (Signed)
Subjective:    Patient ID: [name] [name], male    DOB: February 14, 1920, 78 y.o.   MRN: [id number]  HPI   The patient needs to address  chronic hypertension that has not  been well controlled with medicines; to address chronic  HTN, hyperlipidemia controlled with medicines ; and to address OA, controlled with medical treatment and diet.  C/o skin lesion L temple and 2 on nose   C/o B ankle edema -- better   BP Readings from Last 3 Encounters:  06/01/12 130/82  05/26/12 150/70  03/02/12 130/78   Wt Readings from Last 3 Encounters:  06/01/12 186 lb (84.369 kg)  05/26/12 184 lb 12.8 oz (83.825 kg)  03/02/12 191 lb (86.637 kg)      Review of Systems  Constitutional: Negative for appetite change, fatigue and unexpected weight change.  HENT: Negative for nosebleeds, congestion, sore throat, sneezing, trouble swallowing and neck pain.   Eyes: Positive for visual disturbance. Negative for itching.  Respiratory: Negative for cough and shortness of breath.   Cardiovascular: Negative for chest pain, palpitations and leg swelling.  Gastrointestinal: Negative for nausea, diarrhea, blood in stool and abdominal distention.  Genitourinary: Negative for frequency and hematuria.  Musculoskeletal: Positive for gait problem. Negative for back pain and joint swelling.  Skin: Negative for rash.  Neurological: Negative for dizziness, tremors, speech difficulty and weakness.  Psychiatric/Behavioral: Negative for disturbed wake/sleep cycle, dysphoric mood and agitation. The patient is not nervous/anxious.        Objective:   Physical Exam  Constitutional: He is oriented to person, place, and time. He appears well-developed.  HENT:  Mouth/Throat: Oropharynx is clear and moist.  Eyes: Conjunctivae are normal. Pupils are equal, round, and reactive to light.       R eye is scarred  Neck: Normal range of motion. No JVD present. No thyromegaly present.  Cardiovascular: Normal rate and intact distal  pulses.  Exam reveals no gallop and no friction rub.   Murmur (1/6 m) heard. Pulmonary/Chest: Effort normal and breath sounds normal. No respiratory distress. He has no wheezes. He has no rales. He exhibits no tenderness.  Abdominal: Soft. Bowel sounds are normal. He exhibits no distension and no mass. There is no tenderness. There is no rebound and no guarding.  Musculoskeletal: Normal range of motion. He exhibits no edema and no tenderness.  Lymphadenopathy:    He has no cervical adenopathy.  Neurological: He is alert and oriented to person, place, and time. He has normal reflexes. No cranial nerve deficit. He exhibits normal muscle tone. Coordination normal.  Skin: Skin is warm and dry. Rash (L dist wrist - ?ca, large; AKs on scalp) noted. He is not diaphoretic.  Psychiatric: He has a normal mood and affect. His behavior is normal. Judgment and thought content normal.  R hip is a little tender AKs on face  Lab Results  Component Value Date   WBC 5.0 11/28/2011   HGB 15.0 11/28/2011   HCT 43.9 11/28/2011   PLT 126.0* 11/28/2011   GLUCOSE 98 12/11/2011   CHOL 153 11/28/2011   TRIG 115.0 11/28/2011   HDL 36.00* 11/28/2011   LDLCALC 94 11/28/2011   ALT 19 11/28/2011   AST 21 11/28/2011   NA 140 12/11/2011   K 4.8 12/11/2011   CL 105 12/11/2011   CREATININE 1.1 12/11/2011   BUN 27* 12/11/2011   CO2 27 12/11/2011   TSH 2.30 11/28/2011   PSA 0.25 11/28/2011    Procedure Note :  Procedure : Cryosurgery   Indication:   Actinic keratosis(es) x 3   Risks including unsuccessful procedure , bleeding, infection, bruising, scar, a need for a repeat  procedure and others were explained to the patient in detail as well as the benefits. Informed consent was obtained verbally.   3  lesion(s)  on  face  was/were treated with liquid nitrogen on a Q-tip in a usual fasion . Band-Aid was applied and antibiotic ointment was given for a later use.   Tolerated well. Complications none.   Postprocedure  instructions :     Keep the wounds clean. You can wash them with liquid soap and water. Pat dry with gauze or a Kleenex tissue  Before applying antibiotic ointment and a Band-Aid.   You need to report immediately  if  any signs of infection develop.         Assessment & Plan:

## 2012-06-01 NOTE — Assessment & Plan Note (Signed)
See Cryo 

## 2012-06-16 ENCOUNTER — Telehealth: Payer: Self-pay | Admitting: Internal Medicine

## 2012-06-16 ENCOUNTER — Telehealth: Payer: Self-pay | Admitting: Cardiology

## 2012-06-16 NOTE — Telephone Encounter (Signed)
The pt called and is hoping to discuss his medications.  He states he has confusion on some.  Call back - [phone]  Thanks!

## 2012-06-16 NOTE — Telephone Encounter (Signed)
Pt had questions about whether he needed to be taking Cozaar and Hyzaar.  Report directions with him.  He states understanding.

## 2012-06-16 NOTE — Telephone Encounter (Signed)
New Problem:    Patient called in wanting to check his medication list with you. Please call back.

## 2012-06-17 NOTE — Telephone Encounter (Signed)
Pt had a question about losartan 25 mg and losartan/hctz 50/12.5 mg. He wanted to know if he should be taking  Both. I advised him Dr. Antoine Poche rx's both meds and he should check with his office re: this. Pt states he is going to continue both until he is advised otherwise.

## 2012-07-08 ENCOUNTER — Other Ambulatory Visit: Payer: Self-pay

## 2012-07-08 DIAGNOSIS — I10 Essential (primary) hypertension: Secondary | ICD-10-CM

## 2012-07-08 MED ORDER — LOSARTAN POTASSIUM 25 MG PO TABS: 25.0000 mg | ORAL_TABLET | Freq: Every day | ORAL | Status: DC

## 2012-07-08 NOTE — Telephone Encounter (Signed)
..   Requested Prescriptions   Signed Prescriptions Disp Refills  . losartan (COZAAR) 25 MG tablet 30 tablet 11    Sig: Take 1 tablet (25 mg total) by mouth daily.    Authorizing Provider: Rollene Rotunda    Ordering User: Christella Hartigan, Cortlynn Hollinsworth Judie Petit

## 2012-07-13 ENCOUNTER — Other Ambulatory Visit: Payer: Self-pay

## 2012-07-13 DIAGNOSIS — I10 Essential (primary) hypertension: Secondary | ICD-10-CM

## 2012-07-13 MED ORDER — LOSARTAN POTASSIUM 25 MG PO TABS: 25.0000 mg | ORAL_TABLET | Freq: Every day | ORAL | Status: DC

## 2012-07-13 NOTE — Telephone Encounter (Signed)
..   Requested Prescriptions   Signed Prescriptions Disp Refills  . losartan (COZAAR) 25 MG tablet 90 tablet 3    Sig: Take 1 tablet (25 mg total) by mouth daily.    Authorizing Provider: Rollene Rotunda    Ordering User: Christella Hartigan, Shalynn Jorstad Judie Petit

## 2012-07-28 ENCOUNTER — Telehealth: Payer: Self-pay | Admitting: *Deleted

## 2012-07-28 DIAGNOSIS — F419 Anxiety disorder, unspecified: Secondary | ICD-10-CM

## 2012-07-28 MED ORDER — ALPRAZOLAM 1 MG PO TABS: 1.0000 mg | ORAL_TABLET | Freq: Two times a day (BID) | ORAL | Status: DC | PRN

## 2012-07-28 NOTE — Telephone Encounter (Signed)
Done hardcopy to robin  

## 2012-07-28 NOTE — Telephone Encounter (Signed)
Rf req for Alprazolam 1 mg 1 po bid. Last filled 07/02/12. Ok to Rf in PCP absence? Thanks!

## 2012-07-28 NOTE — Telephone Encounter (Signed)
Faxed hardcopy to pharmacy. 

## 2012-07-30 ENCOUNTER — Other Ambulatory Visit: Payer: Self-pay | Admitting: Internal Medicine

## 2012-08-04 ENCOUNTER — Ambulatory Visit (INDEPENDENT_AMBULATORY_CARE_PROVIDER_SITE_OTHER): Payer: Medicare Other

## 2012-08-04 DIAGNOSIS — Z23 Encounter for immunization: Secondary | ICD-10-CM

## 2012-09-07 ENCOUNTER — Ambulatory Visit: Payer: Medicare Other | Admitting: Internal Medicine

## 2012-09-16 ENCOUNTER — Ambulatory Visit (INDEPENDENT_AMBULATORY_CARE_PROVIDER_SITE_OTHER): Payer: Medicare Other | Admitting: Internal Medicine

## 2012-09-16 ENCOUNTER — Other Ambulatory Visit (INDEPENDENT_AMBULATORY_CARE_PROVIDER_SITE_OTHER): Payer: Medicare Other

## 2012-09-16 ENCOUNTER — Telehealth: Payer: Self-pay | Admitting: Internal Medicine

## 2012-09-16 ENCOUNTER — Encounter: Payer: Self-pay | Admitting: Internal Medicine

## 2012-09-16 VITALS — BP 118/80 | HR 60 | Temp 96.8°F | Resp 16 | Wt 192.0 lb

## 2012-09-16 DIAGNOSIS — I10 Essential (primary) hypertension: Secondary | ICD-10-CM

## 2012-09-16 DIAGNOSIS — I2510 Atherosclerotic heart disease of native coronary artery without angina pectoris: Secondary | ICD-10-CM

## 2012-09-16 DIAGNOSIS — F341 Dysthymic disorder: Secondary | ICD-10-CM

## 2012-09-16 DIAGNOSIS — M1990 Unspecified osteoarthritis, unspecified site: Secondary | ICD-10-CM

## 2012-09-16 DIAGNOSIS — Z23 Encounter for immunization: Secondary | ICD-10-CM

## 2012-09-16 DIAGNOSIS — E785 Hyperlipidemia, unspecified: Secondary | ICD-10-CM

## 2012-09-16 LAB — BASIC METABOLIC PANEL
BUN: 46 mg/dL — ABNORMAL HIGH (ref 6–23)
CO2: 26 mEq/L (ref 19–32)
Calcium: 8.5 mg/dL (ref 8.4–10.5)
Creatinine, Ser: 1.8 mg/dL — ABNORMAL HIGH (ref 0.4–1.5)
GFR: 38.55 mL/min — ABNORMAL LOW (ref >60.00)
Glucose, Bld: 83 mg/dL (ref 70–99)

## 2012-09-16 NOTE — Assessment & Plan Note (Signed)
Continue with current prescription therapy as reflected on the Med list.  

## 2012-09-16 NOTE — Telephone Encounter (Signed)
[name] [name], please, inform patient that all labs are normal except for elev kidney tests 1. Drink more water 2.. Take Losartan/HCT 1 /2 tab a day Thx

## 2012-09-16 NOTE — Assessment & Plan Note (Signed)
Discussed.

## 2012-09-16 NOTE — Progress Notes (Signed)
Subjective:    Patient ID: [name] [name], male    DOB: July 16, 1920, 78 y.o.   MRN: [id number]  HPI   The patient needs to address  chronic hypertension that has not  been well controlled with medicines; to address chronic  HTN, hyperlipidemia controlled with medicines ; and to address OA, controlled with medical treatment and diet.  C/o skin lesions C/o L shoulder pain after he fell in te yard   C/o B ankle edema -- better   BP Readings from Last 3 Encounters:  09/16/12 118/80  06/01/12 130/82  05/26/12 150/70   Wt Readings from Last 3 Encounters:  09/16/12 192 lb (87.091 kg)  06/01/12 186 lb (84.369 kg)  05/26/12 184 lb 12.8 oz (83.825 kg)      Review of Systems  Constitutional: Negative for appetite change, fatigue and unexpected weight change.  HENT: Negative for nosebleeds, congestion, sore throat, sneezing, trouble swallowing and neck pain.   Eyes: Positive for visual disturbance. Negative for itching.  Respiratory: Negative for cough and shortness of breath.   Cardiovascular: Negative for chest pain, palpitations and leg swelling.  Gastrointestinal: Negative for nausea, diarrhea, blood in stool and abdominal distention.  Genitourinary: Negative for frequency and hematuria.  Musculoskeletal: Positive for gait problem. Negative for back pain and joint swelling.  Skin: Negative for rash.  Neurological: Negative for dizziness, tremors, speech difficulty and weakness.  Psychiatric/Behavioral: Negative for disturbed wake/sleep cycle, dysphoric mood and agitation. The patient is not nervous/anxious.        Objective:   Physical Exam  Constitutional: He is oriented to person, place, and time. He appears well-developed.  HENT:  Mouth/Throat: Oropharynx is clear and moist.  Eyes: Conjunctivae normal are normal. Pupils are equal, round, and reactive to light.       R eye is scarred  Neck: Normal range of motion. No JVD present. No thyromegaly present.  Cardiovascular:  Normal rate and intact distal pulses.  Exam reveals no gallop and no friction rub.   Murmur (1/6 m) heard. Pulmonary/Chest: Effort normal and breath sounds normal. No respiratory distress. He has no wheezes. He has no rales. He exhibits no tenderness.  Abdominal: Soft. Bowel sounds are normal. He exhibits no distension and no mass. There is no tenderness. There is no rebound and no guarding.  Musculoskeletal: Normal range of motion. He exhibits no edema and no tenderness.  Lymphadenopathy:    He has no cervical adenopathy.  Neurological: He is alert and oriented to person, place, and time. He has normal reflexes. No cranial nerve deficit. He exhibits normal muscle tone. Coordination normal.  Skin: Skin is warm and dry. Rash (L dist wrist - ?ca, large; AKs on scalp) noted. He is not diaphoretic.  Psychiatric: He has a normal mood and affect. His behavior is normal. Judgment and thought content normal.  R hip is a little tender. L shoulder with pain and restriction of ROM AKs on face treated Thick toenails  Lab Results  Component Value Date   WBC 5.0 11/28/2011   HGB 15.0 11/28/2011   HCT 43.9 11/28/2011   PLT 126.0* 11/28/2011   GLUCOSE 99 06/01/2012   CHOL 153 11/28/2011   TRIG 115.0 11/28/2011   HDL 36.00* 11/28/2011   LDLCALC 94 11/28/2011   ALT 19 11/28/2011   AST 21 11/28/2011   NA 140 06/01/2012   K 5.3* 06/01/2012   CL 108 06/01/2012   CREATININE 1.4 06/01/2012   BUN 38* 06/01/2012   CO2 25 06/01/2012  TSH 2.30 11/28/2011   PSA 0.25 11/28/2011         Assessment & Plan:

## 2012-09-16 NOTE — Telephone Encounter (Signed)
Pt informed

## 2012-09-21 ENCOUNTER — Ambulatory Visit: Payer: Medicare Other | Admitting: Internal Medicine

## 2012-09-24 ENCOUNTER — Other Ambulatory Visit: Payer: Self-pay | Admitting: Internal Medicine

## 2012-10-13 ENCOUNTER — Emergency Department (HOSPITAL_COMMUNITY)
Admission: EM | Admit: 2012-10-13 | Discharge: 2012-10-13 | Disposition: A | Discharge status: Home / Self Care | Payer: Medicare Other | Attending: Emergency Medicine | Admitting: Emergency Medicine

## 2012-10-13 ENCOUNTER — Emergency Department (HOSPITAL_COMMUNITY): Payer: Medicare Other

## 2012-10-13 ENCOUNTER — Encounter (HOSPITAL_COMMUNITY): Payer: Self-pay | Admitting: Emergency Medicine

## 2012-10-13 DIAGNOSIS — I10 Essential (primary) hypertension: Secondary | ICD-10-CM | POA: Insufficient documentation

## 2012-10-13 DIAGNOSIS — R296 Repeated falls: Secondary | ICD-10-CM | POA: Insufficient documentation

## 2012-10-13 DIAGNOSIS — S0990XA Unspecified injury of head, initial encounter: Secondary | ICD-10-CM

## 2012-10-13 DIAGNOSIS — Z23 Encounter for immunization: Secondary | ICD-10-CM | POA: Insufficient documentation

## 2012-10-13 DIAGNOSIS — Y9229 Other specified public building as the place of occurrence of the external cause: Secondary | ICD-10-CM | POA: Insufficient documentation

## 2012-10-13 DIAGNOSIS — Z7982 Long term (current) use of aspirin: Secondary | ICD-10-CM | POA: Insufficient documentation

## 2012-10-13 DIAGNOSIS — Y9389 Activity, other specified: Secondary | ICD-10-CM | POA: Insufficient documentation

## 2012-10-13 DIAGNOSIS — IMO0002 Reserved for concepts with insufficient information to code with codable children: Secondary | ICD-10-CM | POA: Insufficient documentation

## 2012-10-13 DIAGNOSIS — W19XXXA Unspecified fall, initial encounter: Secondary | ICD-10-CM

## 2012-10-13 DIAGNOSIS — E785 Hyperlipidemia, unspecified: Secondary | ICD-10-CM | POA: Insufficient documentation

## 2012-10-13 DIAGNOSIS — Z79899 Other long term (current) drug therapy: Secondary | ICD-10-CM | POA: Insufficient documentation

## 2012-10-13 DIAGNOSIS — I2510 Atherosclerotic heart disease of native coronary artery without angina pectoris: Secondary | ICD-10-CM | POA: Insufficient documentation

## 2012-10-13 DIAGNOSIS — T148XXA Other injury of unspecified body region, initial encounter: Secondary | ICD-10-CM

## 2012-10-13 LAB — POCT I-STAT, CHEM 8
BUN: 30 mg/dL — ABNORMAL HIGH (ref 6–23)
Calcium, Ion: 1.17 mmol/L (ref 1.13–1.30)
Chloride: 108 mEq/L (ref 96–112)
Creatinine, Ser: 1.5 mg/dL — ABNORMAL HIGH (ref 0.50–1.35)
Glucose, Bld: 97 mg/dL (ref 70–99)
Potassium: 5.3 mEq/L — ABNORMAL HIGH (ref 3.5–5.1)

## 2012-10-13 MED ORDER — TETANUS-DIPHTH-ACELL PERTUSSIS 5-2.5-18.5 LF-MCG/0.5 IM SUSP
0.5000 mL | Freq: Once | INTRAMUSCULAR | Status: AC
  Administered 2012-10-13: 0.5 mL via INTRAMUSCULAR
  Filled 2012-10-13: qty 0.5

## 2012-10-13 NOTE — ED Provider Notes (Signed)
History     CSN: [id number]  Arrival date & time 10/13/12  1129   First MD Initiated Contact with Patient 10/13/12 1143      Chief Complaint  Patient presents with  . Fall    (Consider location/radiation/quality/duration/timing/severity/associated sxs/prior treatment) HPI Comments: The patient was out shopping and states he bent over to pick up some money that he dropped and lost his balance falling onto his left forehead, left wrist and left knee. EMS was called and patient's refused immobilization. He currently complains of abrasions on his left hand, wrist, knee. Full range of motion at baseline in left upper and lower extremities. Patient takes aspirin and diclofenac but no other blood thinners. No loss of consciousness. No new blurry vision, loss of vision, vomiting, weakness, numbness, or tingling in his extremities. No treatments prior to arrival. Onset acute. Course is constant. Nothing makes symptoms better or worse.  Patient is a 78 y.o. male presenting with fall. The history is provided by the patient.  Fall Pertinent negatives include no numbness, no nausea, no vomiting and no headaches.    Past Medical History  Diagnosis Date  . Hypertension   . Hyperlipidemia   . Coronary artery disease   . Cardiomyopathy   . Hemorrhoids     Past Surgical History  Procedure Date  . Tonsilectomy, adenoidectomy, bilateral myringotomy and tubes   . Colonic polyps   . Cholecystectomy   . Inguinal hernia repair     No family history on file.  History  Substance Use Topics  . Smoking status: Never Smoker   . Smokeless tobacco: Never Used  . Alcohol Use: No      Review of Systems  Constitutional: Negative for fatigue.  HENT: Negative for neck pain and tinnitus.   Eyes: Negative for photophobia, pain and visual disturbance.  Respiratory: Negative for shortness of breath.   Cardiovascular: Negative for chest pain.  Gastrointestinal: Negative for nausea and vomiting.    Musculoskeletal: Positive for arthralgias. Negative for back pain and gait problem.  Skin: Positive for wound.  Neurological: Negative for dizziness, weakness, light-headedness, numbness and headaches.  Psychiatric/Behavioral: Negative for confusion and decreased concentration.    Allergies  Lisinopril  Home Medications   Current Outpatient Rx  Name  Route  Sig  Dispense  Refill  . ALPRAZOLAM 1 MG PO TABS   Oral   Take 1 tablet (1 mg total) by mouth 2 (two) times daily as needed for sleep or anxiety.   60 tablet   2   . ASPIRIN 81 MG PO TABS   Oral   Take 81 mg by mouth daily.           Marland Kitchen VITAMIN D 1000 UNITS PO TABS   Oral   Take 1 tablet (1,000 Units total) by mouth daily.   100 tablet   3   . DICLOFENAC SODIUM 75 MG PO TBEC      TAKE 1 TABLET TWICE DAILY WITH FOOD.   60 tablet   3   . LOSARTAN POTASSIUM-HCTZ 50-12.5 MG PO TABS   Oral   Take 1 tablet by mouth daily.   90 tablet   3   . METOPROLOL SUCCINATE ER 50 MG PO TB24   Oral   Take 1 tablet (50 mg total) by mouth daily.   90 tablet   3   . SILDENAFIL CITRATE 50 MG PO TABS   Oral   Take 50 mg by mouth daily as needed.           Marland Kitchen  SIMVASTATIN 40 MG PO TABS   Oral   Take 0.5 tablets (20 mg total) by mouth at bedtime.   90 tablet   3     BP 160/60  Pulse 58  Temp 97.7 F (36.5 C)  Resp 18  SpO2 98%  Physical Exam  Nursing note and vitals reviewed. Constitutional: He is oriented to person, place, and time. He appears well-developed and well-nourished.  HENT:  Head: Normocephalic. Head is without raccoon's eyes and without Battle's sign.  Right Ear: Tympanic membrane, external ear and ear canal normal. No hemotympanum.  Left Ear: Tympanic membrane, external ear and ear canal normal. No hemotympanum.  Nose: Nose normal. No nasal septal hematoma.  Mouth/Throat: Oropharynx is clear and moist.       Abrasion L forehead. Clean. Hemostatic.   Eyes: Conjunctivae normal, EOM and lids are  normal. Pupils are equal, round, and reactive to light.       No visible hyphema, cataract R eye, irregular L pupil  Neck: Normal range of motion. Neck supple.  Cardiovascular: Normal rate and regular rhythm.   Pulmonary/Chest: Effort normal and breath sounds normal.  Abdominal: Soft. There is no tenderness.  Musculoskeletal: Normal range of motion.       Cervical back: He exhibits normal range of motion, no tenderness and no bony tenderness.       Thoracic back: He exhibits no tenderness and no bony tenderness.       Lumbar back: He exhibits no tenderness and no bony tenderness.       Mild clean abrasions dorsum L wrist and over L patella. Full active ROM in L hand, wrist, elbow. Baseline ROM in L shoulder (pt reports limited 2/2 arthritis). Full active ROM L ankle, knee, hip.   Neurological: He is alert and oriented to person, place, and time. He has normal strength and normal reflexes. No cranial nerve deficit or sensory deficit. Coordination normal. GCS eye subscore is 4. GCS verbal subscore is 5. GCS motor subscore is 6.  Skin: Skin is warm and dry.  Psychiatric: He has a normal mood and affect.    ED Course  Procedures (including critical care time)  Labs Reviewed  POCT I-STAT, CHEM 8 - Abnormal; Notable for the following:    Potassium 5.3 (*)     BUN 30 (*)     Creatinine, Ser 1.50 (*)     All other components within normal limits   Ct Head Wo Contrast  10/13/2012  *RADIOLOGY REPORT*  Clinical Data: Dizziness after fall earlier today, on blood thinners, left frontal hematoma  CT HEAD WITHOUT CONTRAST  Technique: 2 years axial images  Comparison:  None.  Findings: There is diffuse age related atrophy.  There is no skull fracture.  There is a mild left scalp hematoma.  There is no intracranial hemorrhage, infarct, or mass.  IMPRESSION: No acute intracranial abnormalities.   Original Report Authenticated By: Esperanza Heir, M.D.      1. Fall   2. Head injury   3. Abrasion      11:52 AM Patient seen and examined. Work-up initiated. Pt anxious to go home. D/w Dr. Freida Busman.   Vital signs reviewed and are as follows: Filed Vitals:   10/13/12 1135  BP: 160/60  Pulse: 58  Temp: 97.7 F (36.5 C)  Resp: 18    Date: 10/13/2012  Rate: 65  Rhythm: normal sinus rhythm and premature ventricular contractions (PVC)  QRS Axis: left  Intervals: normal  ST/T Wave abnormalities: normal  Conduction Disutrbances:right bundle branch block and left anterior fascicular block  Narrative Interpretation:   Old EKG Reviewed: None available  12:02 PM D/w Dr. Freida Busman who has seen.   12:38 PM CT returned and neg for intracranial injury. Pt informed. Labs show baseline Crt, mild elevation in K which has been present in past.   Patient was counseled on head injury precautions and symptoms that should indicate their return to the ED.  These include severe worsening headache, vision changes, confusion, loss of consciousness, trouble walking, nausea & vomiting, or weakness/tingling in extremities.     MDM  Head injury in 78yo male: CT neg. No gross neurological deficit. AAOx3.   Fall: mechanical, no LOC, no concern for syncope/arrhythmia. No chest or abdominal pain. No concerned for hip fracture. EKG: occasional PVC.  Abrasions: Abrasions overlying L wrist and L knee but full ROM in these joints. Do not suspect fractures.         Renne Crigler, [place] 10/13/12 1242

## 2012-10-13 NOTE — ED Notes (Signed)
Bed:WA13<BR> Expected date:<BR> Expected time:<BR> Means of arrival:<BR> Comments:<BR> fall

## 2012-10-13 NOTE — ED Notes (Signed)
Fell at [employer], witnessed fall. No LOC. 3 injuries head, wrist and elbow. On blood thinner, signed refusal to go to [facility] and immobilization. Alert and oriented x4.

## 2012-10-13 NOTE — ED Notes (Signed)
Pt denies dizziness, LOC, headache, and pain. Pt states he bent over to pick up change that he dropped on the ground outside. Thinks his foot slipped and he fell.

## 2012-10-13 NOTE — ED Provider Notes (Signed)
Medical screening examination/treatment/procedure(s) were conducted as a shared visit with non-physician practitioner(s) and myself.  I personally evaluated the patient during the encounter  Pt with likely vagal episode, will check head ct and labs  Toy Baker, MD 10/13/12 1200

## 2012-10-15 NOTE — ED Provider Notes (Signed)
Medical screening examination/treatment/procedure(s) were performed by non-physician practitioner and as supervising physician I was immediately available for consultation/collaboration.  Chassity Ludke T Yazmen Briones, MD 10/15/12 1549 

## 2012-11-13 ENCOUNTER — Other Ambulatory Visit: Payer: Self-pay | Admitting: *Deleted

## 2012-11-13 MED ORDER — METOPROLOL SUCCINATE ER 50 MG PO TB24: 50.0000 mg | ORAL_TABLET | Freq: Every day | ORAL | Status: DC

## 2012-11-13 MED ORDER — SIMVASTATIN 40 MG PO TABS: 20.0000 mg | ORAL_TABLET | Freq: Every day | ORAL | Status: DC

## 2012-11-19 ENCOUNTER — Other Ambulatory Visit: Payer: Self-pay | Admitting: *Deleted

## 2012-11-25 ENCOUNTER — Telehealth: Payer: Self-pay | Admitting: *Deleted

## 2012-11-25 DIAGNOSIS — F419 Anxiety disorder, unspecified: Secondary | ICD-10-CM

## 2012-11-25 NOTE — Telephone Encounter (Signed)
OK to fill this prescription with additional refills x3 Thank you!  

## 2012-11-25 NOTE — Telephone Encounter (Signed)
Rf req for Alprazolam 1 mg # 60. Last filled 10/29/12. Ok to Rf?

## 2012-11-26 MED ORDER — ALPRAZOLAM 1 MG PO TABS: 1.0000 mg | ORAL_TABLET | Freq: Two times a day (BID) | ORAL | Status: DC | PRN

## 2012-11-26 NOTE — Telephone Encounter (Signed)
rx called in

## 2012-12-01 ENCOUNTER — Ambulatory Visit: Payer: Medicare Other | Admitting: Internal Medicine

## 2012-12-28 ENCOUNTER — Ambulatory Visit (INDEPENDENT_AMBULATORY_CARE_PROVIDER_SITE_OTHER): Payer: Medicare Other | Admitting: Internal Medicine

## 2012-12-28 ENCOUNTER — Encounter: Payer: Self-pay | Admitting: Internal Medicine

## 2012-12-28 VITALS — BP 150/70 | Temp 96.7°F | Wt 197.0 lb

## 2012-12-28 DIAGNOSIS — M25519 Pain in unspecified shoulder: Secondary | ICD-10-CM

## 2012-12-28 DIAGNOSIS — I428 Other cardiomyopathies: Secondary | ICD-10-CM

## 2012-12-28 DIAGNOSIS — T464X5A Adverse effect of angiotensin-converting-enzyme inhibitors, initial encounter: Secondary | ICD-10-CM

## 2012-12-28 DIAGNOSIS — R059 Cough, unspecified: Secondary | ICD-10-CM

## 2012-12-28 DIAGNOSIS — I2510 Atherosclerotic heart disease of native coronary artery without angina pectoris: Secondary | ICD-10-CM

## 2012-12-28 DIAGNOSIS — M1990 Unspecified osteoarthritis, unspecified site: Secondary | ICD-10-CM

## 2012-12-28 DIAGNOSIS — R058 Other specified cough: Secondary | ICD-10-CM

## 2012-12-28 DIAGNOSIS — E785 Hyperlipidemia, unspecified: Secondary | ICD-10-CM

## 2012-12-28 DIAGNOSIS — G4700 Insomnia, unspecified: Secondary | ICD-10-CM

## 2012-12-28 DIAGNOSIS — R05 Cough: Secondary | ICD-10-CM

## 2012-12-28 DIAGNOSIS — I10 Essential (primary) hypertension: Secondary | ICD-10-CM

## 2012-12-28 DIAGNOSIS — T44905A Adverse effect of unspecified drugs primarily affecting the autonomic nervous system, initial encounter: Secondary | ICD-10-CM

## 2012-12-28 DIAGNOSIS — F341 Dysthymic disorder: Secondary | ICD-10-CM

## 2012-12-28 MED ORDER — METHYLPREDNISOLONE ACETATE 80 MG/ML IJ SUSP: 40.0000 mg | Freq: Once | INTRAMUSCULAR | Status: DC

## 2012-12-28 NOTE — Assessment & Plan Note (Signed)
Off meds 

## 2012-12-28 NOTE — Assessment & Plan Note (Signed)
Continue with current prescription therapy as reflected on the Med list.  

## 2012-12-28 NOTE — Patient Instructions (Signed)
Postprocedure instructions :    A Band-Aid should be left on for 12 hours. Injection therapy is not a cure itself. It is used in conjunction with other modalities. You can use nonsteroidal anti-inflammatories like ibuprofen hot and cold compresses. Rest is recommended in the next 24 hours. You need to report immediately  if fever, chills or any signs of infection develop.  

## 2012-12-28 NOTE — Progress Notes (Signed)
Patient ID: [name] [name], male   DOB: 1920/11/17, 79 y.o.   MRN: [id number]   Subjective:    Patient ID: [name] [name], male    DOB: 1920-01-08, 79 y.o.   MRN: [id number]  HPI   The patient needs to address  chronic hypertension that has not  been well controlled with medicines; to address chronic  HTN, hyperlipidemia controlled with medicines ; and to address OA, controlled with medical treatment and diet.  C/o skin lesions C/o L shoulder pain after he fell in the yard   C/o B ankle edema -- better   BP Readings from Last 3 Encounters:  12/28/12 150/70  10/13/12 160/60  09/16/12 118/80   Wt Readings from Last 3 Encounters:  12/28/12 197 lb (89.359 kg)  09/16/12 192 lb (87.091 kg)  06/01/12 186 lb (84.369 kg)      Review of Systems  Constitutional: Negative for appetite change, fatigue and unexpected weight change.  HENT: Negative for nosebleeds, congestion, sore throat, sneezing, trouble swallowing and neck pain.   Eyes: Positive for visual disturbance. Negative for itching.  Respiratory: Negative for cough and shortness of breath.   Cardiovascular: Negative for chest pain, palpitations and leg swelling.  Gastrointestinal: Negative for nausea, diarrhea, blood in stool and abdominal distention.  Genitourinary: Negative for frequency and hematuria.  Musculoskeletal: Positive for gait problem. Negative for back pain and joint swelling.  Skin: Negative for rash.  Neurological: Negative for dizziness, tremors, speech difficulty and weakness.  Psychiatric/Behavioral: Negative for sleep disturbance, dysphoric mood and agitation. The patient is not nervous/anxious.        Objective:   Physical Exam  Constitutional: He is oriented to person, place, and time. He appears well-developed.  HENT:  Mouth/Throat: Oropharynx is clear and moist.  Eyes: Conjunctivae are normal. Pupils are equal, round, and reactive to light.  R eye is scarred  Neck: Normal range of motion. No JVD  present. No thyromegaly present.  Cardiovascular: Normal rate and intact distal pulses.  Exam reveals no gallop and no friction rub.   Murmur (1/6 m) heard. Pulmonary/Chest: Effort normal and breath sounds normal. No respiratory distress. He has no wheezes. He has no rales. He exhibits no tenderness.  Abdominal: Soft. Bowel sounds are normal. He exhibits no distension and no mass. There is no tenderness. There is no rebound and no guarding.  Musculoskeletal: Normal range of motion. He exhibits no edema and no tenderness.  Lymphadenopathy:    He has no cervical adenopathy.  Neurological: He is alert and oriented to person, place, and time. He has normal reflexes. No cranial nerve deficit. He exhibits normal muscle tone. Coordination normal.  Skin: Skin is warm and dry. Rash (L dist wrist - ?ca, large; AKs on scalp) noted. He is not diaphoretic.  Psychiatric: He has a normal mood and affect. His behavior is normal. Judgment and thought content normal.   L shoulder with pain and restriction of ROM. R shoulder is stiff AKs on face treated Thick toenails  Lab Results  Component Value Date   WBC 5.0 11/28/2011   HGB 13.9 10/13/2012   HCT 41.0 10/13/2012   PLT 126.0* 11/28/2011   GLUCOSE 97 10/13/2012   CHOL 153 11/28/2011   TRIG 115.0 11/28/2011   HDL 36.00* 11/28/2011   LDLCALC 94 11/28/2011   ALT 19 11/28/2011   AST 21 11/28/2011   NA 139 10/13/2012   K 5.3* 10/13/2012   CL 108 10/13/2012   CREATININE 1.50* 10/13/2012   BUN 30* 10/13/2012  CO2 26 09/16/2012   TSH 2.30 11/28/2011   PSA 0.25 11/28/2011     Procedure :Joint Injection, L  shoulder   Indication:  Subacromial bursitis with refractory  chronic pain.   Risks including unsuccessful procedure , bleeding, infection, bruising, skin atrophy and others were explained to the patient in detail as well as the benefits. Informed consent was obtained and signed.   Tthe patient was placed in a comfortable position. Lateral approach was  used. Skin was prepped with Betadine and alcohol  and anesthetized with a cooling spray. Then, a 5 cc syringe with a 2 inch long 24-gauge needle was used for a joint injection.. The needle was advanced  Into the subacromial space.The bursa was injected with 3 mL of 2% lidocaine and 40 mg of Depo-Medrol .  Band-Aid was applied.   Tolerated well. Complications: None. Good pain relief following the procedure.   Postprocedure instructions :    A Band-Aid should be left on for 12 hours. Injection therapy is not a cure itself. It is used in conjunction with other modalities. You can use nonsteroidal anti-inflammatories like ibuprofen hot and cold compresses. Rest is recommended in the next 24 hours. You need to report immediately  if fever, chills or any signs of infection develop.       Assessment & Plan:

## 2012-12-28 NOTE — Assessment & Plan Note (Addendum)
Discussed  Chronic L>>R Will inject

## 2012-12-28 NOTE — Assessment & Plan Note (Signed)
Resolved

## 2013-02-18 ENCOUNTER — Other Ambulatory Visit: Payer: Self-pay | Admitting: *Deleted

## 2013-02-18 MED ORDER — LOSARTAN POTASSIUM-HCTZ 50-12.5 MG PO TABS: 1.0000 | ORAL_TABLET | Freq: Every day | ORAL | Status: DC

## 2013-03-29 ENCOUNTER — Ambulatory Visit (INDEPENDENT_AMBULATORY_CARE_PROVIDER_SITE_OTHER): Payer: Medicare Other | Admitting: Internal Medicine

## 2013-03-29 ENCOUNTER — Other Ambulatory Visit (INDEPENDENT_AMBULATORY_CARE_PROVIDER_SITE_OTHER): Payer: Medicare Other

## 2013-03-29 ENCOUNTER — Encounter: Payer: Self-pay | Admitting: Internal Medicine

## 2013-03-29 VITALS — BP 120/74 | HR 72 | Resp 16 | Wt 192.0 lb

## 2013-03-29 DIAGNOSIS — E785 Hyperlipidemia, unspecified: Secondary | ICD-10-CM

## 2013-03-29 DIAGNOSIS — I2510 Atherosclerotic heart disease of native coronary artery without angina pectoris: Secondary | ICD-10-CM

## 2013-03-29 DIAGNOSIS — I10 Essential (primary) hypertension: Secondary | ICD-10-CM

## 2013-03-29 DIAGNOSIS — R202 Paresthesia of skin: Secondary | ICD-10-CM

## 2013-03-29 DIAGNOSIS — R209 Unspecified disturbances of skin sensation: Secondary | ICD-10-CM

## 2013-03-29 DIAGNOSIS — I428 Other cardiomyopathies: Secondary | ICD-10-CM

## 2013-03-29 LAB — HEPATIC FUNCTION PANEL
ALT: 18 U/L (ref 0–53)
AST: 24 U/L (ref 0–37)
Alkaline Phosphatase: 97 U/L (ref 39–117)
Bilirubin, Direct: 0.2 mg/dL (ref 0.0–0.3)
Total Bilirubin: 1.6 mg/dL — ABNORMAL HIGH (ref 0.3–1.2)
Total Protein: 6.7 g/dL (ref 6.0–8.3)

## 2013-03-29 LAB — BASIC METABOLIC PANEL
BUN: 43 mg/dL — ABNORMAL HIGH (ref 6–23)
Calcium: 8.5 mg/dL (ref 8.4–10.5)
Creatinine, Ser: 1.7 mg/dL — ABNORMAL HIGH (ref 0.4–1.5)
GFR: 41.46 mL/min — ABNORMAL LOW (ref >60.00)
Glucose, Bld: 89 mg/dL (ref 70–99)
Potassium: 5.3 mEq/L — ABNORMAL HIGH (ref 3.5–5.1)

## 2013-03-29 MED ORDER — GABAPENTIN 100 MG PO CAPS: 100.0000 mg | ORAL_CAPSULE | Freq: Three times a day (TID) | ORAL | Status: DC | PRN

## 2013-03-29 NOTE — Progress Notes (Signed)
Subjective:    HPI   The patient needs to address  chronic hypertension that has not  been well controlled with medicines; to address chronic  HTN, hyperlipidemia controlled with medicines ; and to address OA, controlled with medical treatment and diet.  C/o skin lesions C/o L shoulder pain after he fell in the yard   C/o B ankle edema -- better Co neuropathy in B legs - worse w/walking. He has been working on his 200 tomato plants 3h/day(!) each am   BP Readings from Last 3 Encounters:  03/29/13 120/74  12/28/12 150/70  10/13/12 160/60   Wt Readings from Last 3 Encounters:  03/29/13 192 lb (87.091 kg)  12/28/12 197 lb (89.359 kg)  09/16/12 192 lb (87.091 kg)      Review of Systems  Constitutional: Negative for appetite change, fatigue and unexpected weight change.  HENT: Negative for nosebleeds, congestion, sore throat, sneezing, trouble swallowing and neck pain.   Eyes: Positive for visual disturbance. Negative for itching.  Respiratory: Negative for cough and shortness of breath.   Cardiovascular: Negative for chest pain, palpitations and leg swelling.  Gastrointestinal: Negative for nausea, diarrhea, blood in stool and abdominal distention.  Genitourinary: Negative for frequency and hematuria.  Musculoskeletal: Positive for gait problem. Negative for back pain and joint swelling.  Skin: Negative for rash.  Neurological: Negative for dizziness, tremors, speech difficulty and weakness.  Psychiatric/Behavioral: Negative for sleep disturbance, dysphoric mood and agitation. The patient is not nervous/anxious.        Objective:   Physical Exam  Constitutional: He is oriented to person, place, and time. He appears well-developed.  HENT:  Mouth/Throat: Oropharynx is clear and moist.  Eyes: Conjunctivae are normal. Pupils are equal, round, and reactive to light.  R eye is scarred  Neck: Normal range of motion. No JVD present. No thyromegaly present.  Cardiovascular:  Normal rate and intact distal pulses.  Exam reveals no gallop and no friction rub.   Murmur (1/6 m) heard. Pulmonary/Chest: Effort normal and breath sounds normal. No respiratory distress. He has no wheezes. He has no rales. He exhibits no tenderness.  Abdominal: Soft. Bowel sounds are normal. He exhibits no distension and no mass. There is no tenderness. There is no rebound and no guarding.  Musculoskeletal: Normal range of motion. He exhibits no edema and no tenderness.  Lymphadenopathy:    He has no cervical adenopathy.  Neurological: He is alert and oriented to person, place, and time. He has normal reflexes. No cranial nerve deficit. He exhibits normal muscle tone. Coordination normal.  Skin: Skin is warm and dry. Rash (L dist wrist - ?ca, large; AKs on scalp) noted. He is not diaphoretic.  Psychiatric: He has a normal mood and affect. His behavior is normal. Judgment and thought content normal.   L shoulder with pain and restriction of ROM. R shoulder is stiff AKs on face treated Thick toenails No edema  Lab Results  Component Value Date   WBC 5.0 11/28/2011   HGB 13.9 10/13/2012   HCT 41.0 10/13/2012   PLT 126.0* 11/28/2011   GLUCOSE 97 10/13/2012   CHOL 153 11/28/2011   TRIG 115.0 11/28/2011   HDL 36.00* 11/28/2011   LDLCALC 94 11/28/2011   ALT 19 11/28/2011   AST 21 11/28/2011   NA 139 10/13/2012   K 5.3* 10/13/2012   CL 108 10/13/2012   CREATININE 1.50* 10/13/2012   BUN 30* 10/13/2012   CO2 26 09/16/2012   TSH 2.30 11/28/2011  PSA 0.25 11/28/2011         Assessment & Plan:

## 2013-03-29 NOTE — Assessment & Plan Note (Signed)
Do not overworked in the yard

## 2013-03-29 NOTE — Assessment & Plan Note (Signed)
Continue with current prescription therapy as reflected on the Med list.  

## 2013-03-29 NOTE — Assessment & Plan Note (Addendum)
Chronic B LE neuropathy Labs Cut back on gardening! Neurontin low dose prn

## 2013-03-29 NOTE — Assessment & Plan Note (Signed)
Cut back on gardening! Avoid overheating/dehydraion

## 2013-03-29 NOTE — Patient Instructions (Signed)
Cut back on gardening! Avoid overheating/dehydraion 

## 2013-04-29 ENCOUNTER — Other Ambulatory Visit: Payer: Self-pay | Admitting: Internal Medicine

## 2013-05-12 ENCOUNTER — Other Ambulatory Visit: Payer: Self-pay

## 2013-05-12 MED ORDER — LOSARTAN POTASSIUM-HCTZ 50-12.5 MG PO TABS: 0.5000 | ORAL_TABLET | Freq: Every day | ORAL | Status: DC

## 2013-05-18 ENCOUNTER — Other Ambulatory Visit: Payer: Self-pay

## 2013-05-18 MED ORDER — LOSARTAN POTASSIUM-HCTZ 50-12.5 MG PO TABS: 0.5000 | ORAL_TABLET | Freq: Every day | ORAL | Status: AC

## 2013-05-25 ENCOUNTER — Encounter: Payer: Self-pay | Admitting: Cardiology

## 2013-05-25 ENCOUNTER — Telehealth: Payer: Self-pay | Admitting: *Deleted

## 2013-05-25 ENCOUNTER — Telehealth: Payer: Self-pay | Admitting: Cardiology

## 2013-05-25 ENCOUNTER — Ambulatory Visit (INDEPENDENT_AMBULATORY_CARE_PROVIDER_SITE_OTHER): Payer: Medicare Other | Admitting: Cardiology

## 2013-05-25 VITALS — BP 142/70 | HR 61 | Ht 68.0 in | Wt 188.0 lb

## 2013-05-25 DIAGNOSIS — F419 Anxiety disorder, unspecified: Secondary | ICD-10-CM

## 2013-05-25 DIAGNOSIS — E785 Hyperlipidemia, unspecified: Secondary | ICD-10-CM

## 2013-05-25 DIAGNOSIS — I10 Essential (primary) hypertension: Secondary | ICD-10-CM

## 2013-05-25 DIAGNOSIS — I2510 Atherosclerotic heart disease of native coronary artery without angina pectoris: Secondary | ICD-10-CM

## 2013-05-25 LAB — LIPID PANEL
Cholesterol: 110 mg/dL (ref 0–200)
VLDL: 16.6 mg/dL (ref 0.0–40.0)

## 2013-05-25 NOTE — Patient Instructions (Addendum)
The current medical regimen is effective;  continue present plan and medications.  Please have blood work today. (lipid)  Follow up in 1 year with Dr Antoine Poche.  You will receive a letter in the mail 2 months before you are due.  Please call us when you receive this letter to schedule your follow up appointment.

## 2013-05-25 NOTE — Progress Notes (Signed)
HPI  The patient presents for yearly followup of his mildly reduced ejection fraction.  He has presumed coronary disease with ischemia on a stress perfusion study in 2008. We have managed this medically. Over the past year he's had some with orthopedic issues. He's not however describing any new shortness of breath, PND or orthopnea. He will rarely get some dyspnea.  He still gardening. He takes care of his wife who has medical issues and a daughter who is dying of COPD. He does have joint problems not allowing him to lift his arms above his head but can do work below his waist.   Allergies  Allergen Reactions  . Lisinopril     cough    Current Outpatient Prescriptions  Medication Sig Dispense Refill  . ALPRAZolam (XANAX) 1 MG tablet Take 1 tablet (1 mg total) by mouth 2 (two) times daily as needed for sleep or anxiety.  60 tablet  3  . aspirin EC 81 MG tablet Take 81 mg by mouth daily.      . diclofenac (VOLTAREN) 75 MG EC tablet TAKE (1) TABLET TWICE A DAY WITH FOOD.  60 tablet  3  . gabapentin (NEURONTIN) 100 MG capsule Take 1 capsule (100 mg total) by mouth 3 (three) times daily as needed (neuropathy).  90 capsule  5  . losartan-hydrochlorothiazide (HYZAAR) 50-12.5 MG per tablet Take 0.5 tablets by mouth daily.  45 tablet  0  . metoprolol succinate (TOPROL-XL) 50 MG 24 hr tablet Take 1 tablet (50 mg total) by mouth daily.  90 tablet  3  . sildenafil (VIAGRA) 50 MG tablet Take 50 mg by mouth daily as needed. For intercourse.      . simvastatin (ZOCOR) 40 MG tablet Take 0.5 tablets (20 mg total) by mouth at bedtime.  90 tablet  3    Past Medical History  Diagnosis Date  . Hypertension   . Hyperlipidemia   . Coronary artery disease   . Cardiomyopathy   . Hemorrhoids     Past Surgical History  Procedure Laterality Date  . Tonsilectomy, adenoidectomy, bilateral myringotomy and tubes    . Colonic polyps    . Cholecystectomy    . Inguinal hernia repair      ROS:  As stated in  the HPI and negative for all other systems.  PHYSICAL EXAM BP 142/70  Pulse 61  Ht 5\' 8"  (1.727 m)  Wt 188 lb (85.276 kg)  BMI 28.59 kg/m2 GENERAL:  Well appearing HEENT:  Right lens opacification, fundi not visualized, oral mucosa unremarkable NECK:  No jugular venous distention, waveform within normal limits, carotid upstroke brisk and symmetric, no bruits, no thyromegaly LUNGS:  Clear to auscultation bilaterally HEART:  PMI not displaced or sustained,S1 and S2 within normal limits, no S3, no S4, no clicks, no rubs, no murmurs ABD:  Flat, positive bowel sounds normal in frequency in pitch, no bruits, no rebound, no guarding, no midline pulsatile mass, no hepatomegaly, no splenomegaly EXT:  2 plus pulses throughout, no edema, no cyanosis no clubbing SKIN:  No rashes no nodules, multiple bruises   EKG:  Bradycardia rate 61, right bundle branch block, no acute ST-T wave changes. PACs.  05/25/2013   ASSESSMENT AND PLAN  CAD:  The patient has no new sypmtoms.  No further cardiovascular testing is indicated.  We will continue with aggressive risk reduction and meds as listed.  HTN:   The blood pressure is at target. No change in medications is indicated. We will  continue with therapeutic lifestyle changes (TLC).  LIPIDS:  The patient will have a lipid profile. Further therapy will be based on this result.

## 2013-05-25 NOTE — Telephone Encounter (Signed)
OK to fill this prescription with additional refills x2 Thank you!  

## 2013-05-25 NOTE — Telephone Encounter (Signed)
Rf req for Alprazolam 1 mg 1 po bid prn sleep/anxiety. # 60. Ok to Rf?

## 2013-05-25 NOTE — Telephone Encounter (Signed)
Per pt call - states he is taking Hyzaar 50/12.5 mg 1/2 tablet a day and Cozaar 25 mg a day.  Wants to know if he should be taking both.  Aware I will review with MD and call back. In review I see that pt was taking Lisinopril which was stopped d/t cough and he was started on Hyzaar 100-12.5 mg 03/30/12.  He then reported dizziness and was decreased to 1/2 tablet daily.  Pt was given an RX for Cozaar 25 mg daily 05/26/12 by Greta Doom.

## 2013-05-25 NOTE — Telephone Encounter (Signed)
New Prob    Pt has some questions related to medication. Please call.

## 2013-05-26 MED ORDER — ALPRAZOLAM 1 MG PO TABS: 1.0000 mg | ORAL_TABLET | Freq: Two times a day (BID) | ORAL | Status: DC | PRN

## 2013-05-26 NOTE — Telephone Encounter (Signed)
Pt not at home - left message with wife to have pt call back

## 2013-05-26 NOTE — Telephone Encounter (Signed)
Pt aware of instructions

## 2013-05-26 NOTE — Telephone Encounter (Signed)
He should take both if that is what he is doing now as his BP is upper limites of normal with this dose.  I would suggest that the 25 mg Cozaar be taken at night to split up the dosing.

## 2013-05-26 NOTE — Telephone Encounter (Signed)
Follow Up    Pt returning call regarding medication. Please call back.

## 2013-05-26 NOTE — Telephone Encounter (Signed)
Done

## 2013-06-29 ENCOUNTER — Encounter: Payer: Self-pay | Admitting: Internal Medicine

## 2013-06-29 ENCOUNTER — Ambulatory Visit: Payer: Medicare Other | Admitting: Internal Medicine

## 2013-06-29 ENCOUNTER — Ambulatory Visit (INDEPENDENT_AMBULATORY_CARE_PROVIDER_SITE_OTHER): Payer: Medicare Other | Admitting: Internal Medicine

## 2013-06-29 VITALS — BP 110/68 | HR 68 | Temp 97.0°F | Resp 16 | Wt 189.0 lb

## 2013-06-29 DIAGNOSIS — I10 Essential (primary) hypertension: Secondary | ICD-10-CM

## 2013-06-29 DIAGNOSIS — R202 Paresthesia of skin: Secondary | ICD-10-CM

## 2013-06-29 DIAGNOSIS — R209 Unspecified disturbances of skin sensation: Secondary | ICD-10-CM

## 2013-06-29 DIAGNOSIS — F411 Generalized anxiety disorder: Secondary | ICD-10-CM

## 2013-06-29 DIAGNOSIS — M1990 Unspecified osteoarthritis, unspecified site: Secondary | ICD-10-CM

## 2013-06-29 DIAGNOSIS — F419 Anxiety disorder, unspecified: Secondary | ICD-10-CM

## 2013-06-29 DIAGNOSIS — G4700 Insomnia, unspecified: Secondary | ICD-10-CM

## 2013-06-29 DIAGNOSIS — E785 Hyperlipidemia, unspecified: Secondary | ICD-10-CM

## 2013-06-29 DIAGNOSIS — I2510 Atherosclerotic heart disease of native coronary artery without angina pectoris: Secondary | ICD-10-CM

## 2013-06-29 NOTE — Assessment & Plan Note (Signed)
Better  Rest more 

## 2013-06-29 NOTE — Assessment & Plan Note (Signed)
Continue with current prescription therapy as reflected on the Med list.  

## 2013-06-29 NOTE — Assessment & Plan Note (Signed)
Continue with current prn prescription therapy as reflected on the Med list.  

## 2013-06-29 NOTE — Progress Notes (Signed)
Subjective:    HPI   The patient needs to address  chronic hypertension that has not  been well controlled with medicines; to address chronic  HTN, hyperlipidemia controlled with medicines ; and to address OA, controlled with medical treatment and diet.   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 He has been working less on his tomato plants in the garden   BP Readings from Last 3 Encounters:  06/29/13 110/68  05/25/13 142/70  03/29/13 120/74   Wt Readings from Last 3 Encounters:  06/29/13 189 lb (85.73 kg)  05/25/13 188 lb (85.276 kg)  03/29/13 192 lb (87.091 kg)      Review of Systems  Constitutional: Negative for appetite change, fatigue and unexpected weight change.  HENT: Negative for nosebleeds, congestion, sore throat, sneezing, trouble swallowing and neck pain.   Eyes: Positive for visual disturbance. Negative for itching.  Respiratory: Negative for cough and shortness of breath.   Cardiovascular: Negative for chest pain, palpitations and leg swelling.  Gastrointestinal: Negative for nausea, diarrhea, blood in stool and abdominal distention.  Genitourinary: Negative for frequency and hematuria.  Musculoskeletal: Positive for gait problem. Negative for back pain and joint swelling.  Skin: Negative for rash.  Neurological: Negative for dizziness, tremors, speech difficulty and weakness.  Psychiatric/Behavioral: Negative for sleep disturbance, dysphoric mood and agitation. The patient is not nervous/anxious.        Objective:   Physical Exam  Constitutional: He is oriented to person, place, and time. He appears well-developed.  HENT:  Mouth/Throat: Oropharynx is clear and moist.  Eyes: Conjunctivae are normal. Pupils are equal, round, and reactive to light.  R eye is scarred  Neck: Normal range of motion. No JVD present. No thyromegaly present.  Cardiovascular: Normal rate and  intact distal pulses.  Exam reveals no gallop and no friction rub.   Murmur (1/6 m) heard. Pulmonary/Chest: Effort normal and breath sounds normal. No respiratory distress. He has no wheezes. He has no rales. He exhibits no tenderness.  Abdominal: Soft. Bowel sounds are normal. He exhibits no distension and no mass. There is no tenderness. There is no rebound and no guarding.  Musculoskeletal: Normal range of motion. He exhibits no edema and no tenderness.  Lymphadenopathy:    He has no cervical adenopathy.  Neurological: He is alert and oriented to person, place, and time. He has normal reflexes. No cranial nerve deficit. He exhibits normal muscle tone. Coordination normal.  Skin: Skin is warm and dry. Rash (L dist wrist - ?ca, large; AKs on scalp) noted. He is not diaphoretic.  Psychiatric: He has a normal mood and affect. His behavior is normal. Judgment and thought content normal.   L shoulder with pain and restriction of ROM. R shoulder is stiff Thick toenails No edema  Lab Results  Component Value Date   WBC 5.0 11/28/2011   HGB 13.9 10/13/2012   HCT 41.0 10/13/2012   PLT 126.0* 11/28/2011   GLUCOSE 89 03/29/2013   CHOL 110 05/25/2013   TRIG 83.0 05/25/2013   HDL 39.00* 05/25/2013   LDLCALC 54 05/25/2013   ALT 18 03/29/2013   AST 24 03/29/2013   NA 137 03/29/2013   K 5.3* 03/29/2013   CL 105 03/29/2013   CREATININE 1.7* 03/29/2013   BUN 43* 03/29/2013   CO2 25 03/29/2013   TSH 2.30 11/28/2011   PSA 0.25 11/28/2011  Assessment & Plan:

## 2013-07-06 ENCOUNTER — Ambulatory Visit: Payer: Self-pay | Admitting: Internal Medicine

## 2013-07-26 ENCOUNTER — Other Ambulatory Visit: Payer: Self-pay | Admitting: *Deleted

## 2013-07-26 MED ORDER — LOSARTAN POTASSIUM 25 MG PO TABS: 25.0000 mg | ORAL_TABLET | Freq: Every day | ORAL | Status: DC

## 2013-07-28 ENCOUNTER — Ambulatory Visit (INDEPENDENT_AMBULATORY_CARE_PROVIDER_SITE_OTHER): Payer: Medicare Other | Admitting: *Deleted

## 2013-07-28 DIAGNOSIS — Z23 Encounter for immunization: Secondary | ICD-10-CM

## 2013-09-08 ENCOUNTER — Telehealth: Payer: Self-pay | Admitting: *Deleted

## 2013-09-08 DIAGNOSIS — F419 Anxiety disorder, unspecified: Secondary | ICD-10-CM

## 2013-09-08 NOTE — Telephone Encounter (Signed)
OK to fill this prescription with additional refills x3 Thank you!  

## 2013-09-08 NOTE — Telephone Encounter (Signed)
Rf req for Alprazolam 1 mg 1 po bid prn. # 60. Last filled 08/05/13. Ok to Rf?

## 2013-09-09 MED ORDER — ALPRAZOLAM 1 MG PO TABS: 1.0000 mg | ORAL_TABLET | Freq: Two times a day (BID) | ORAL | Status: DC | PRN

## 2013-09-09 NOTE — Telephone Encounter (Signed)
Done

## 2013-11-02 ENCOUNTER — Encounter: Payer: Self-pay | Admitting: Internal Medicine

## 2013-11-02 ENCOUNTER — Ambulatory Visit (INDEPENDENT_AMBULATORY_CARE_PROVIDER_SITE_OTHER): Payer: Medicare Other | Admitting: Internal Medicine

## 2013-11-02 ENCOUNTER — Other Ambulatory Visit (INDEPENDENT_AMBULATORY_CARE_PROVIDER_SITE_OTHER): Payer: Medicare Other

## 2013-11-02 VITALS — BP 148/80 | HR 80 | Temp 97.0°F | Resp 16 | Wt 196.0 lb

## 2013-11-02 DIAGNOSIS — M25511 Pain in right shoulder: Secondary | ICD-10-CM

## 2013-11-02 DIAGNOSIS — N320 Bladder-neck obstruction: Secondary | ICD-10-CM

## 2013-11-02 DIAGNOSIS — M25519 Pain in unspecified shoulder: Secondary | ICD-10-CM

## 2013-11-02 DIAGNOSIS — Z23 Encounter for immunization: Secondary | ICD-10-CM

## 2013-11-02 DIAGNOSIS — Z0000 Encounter for general adult medical examination without abnormal findings: Secondary | ICD-10-CM

## 2013-11-02 DIAGNOSIS — I10 Essential (primary) hypertension: Secondary | ICD-10-CM

## 2013-11-02 DIAGNOSIS — F341 Dysthymic disorder: Secondary | ICD-10-CM

## 2013-11-02 DIAGNOSIS — L309 Dermatitis, unspecified: Secondary | ICD-10-CM | POA: Insufficient documentation

## 2013-11-02 DIAGNOSIS — I428 Other cardiomyopathies: Secondary | ICD-10-CM

## 2013-11-02 DIAGNOSIS — L259 Unspecified contact dermatitis, unspecified cause: Secondary | ICD-10-CM

## 2013-11-02 LAB — HEPATIC FUNCTION PANEL
ALT: 14 U/L (ref 0–53)
Albumin: 4.1 g/dL (ref 3.5–5.2)
Alkaline Phosphatase: 102 U/L (ref 39–117)
Total Protein: 7 g/dL (ref 6.0–8.3)

## 2013-11-02 LAB — URINALYSIS
Ketones, ur: NEGATIVE
Leukocytes, UA: NEGATIVE
Specific Gravity, Urine: 1.02 (ref 1.000–1.030)
Total Protein, Urine: NEGATIVE
Urine Glucose: NEGATIVE
Urobilinogen, UA: 0.2 (ref 0.0–1.0)
pH: 5.5 (ref 5.0–8.0)

## 2013-11-02 LAB — BASIC METABOLIC PANEL
Chloride: 112 mEq/L (ref 96–112)
GFR: 46.9 mL/min — ABNORMAL LOW (ref >60.00)
Glucose, Bld: 88 mg/dL (ref 70–99)
Potassium: 5.2 mEq/L — ABNORMAL HIGH (ref 3.5–5.1)
Sodium: 143 mEq/L (ref 135–145)

## 2013-11-02 LAB — CBC WITH DIFFERENTIAL/PLATELET
Basophils Absolute: 0 10*3/uL (ref 0.0–0.1)
Basophils Relative: 0.3 % (ref 0.0–3.0)
Eosinophils Absolute: 0.1 10*3/uL (ref 0.0–0.7)
Eosinophils Relative: 2.5 % (ref 0.0–5.0)
HCT: 42.8 % (ref 39.0–52.0)
Hemoglobin: 14.5 g/dL (ref 13.0–17.0)
Lymphocytes Relative: 18 % (ref 12.0–46.0)
Monocytes Relative: 10.2 % (ref 3.0–12.0)
Neutro Abs: 3.7 10*3/uL (ref 1.4–7.7)
Neutrophils Relative %: 69 % (ref 43.0–77.0)
RBC: 4.62 Mil/uL (ref 4.22–5.81)
RDW: 13.9 % (ref 11.5–14.6)
WBC: 5.3 10*3/uL (ref 4.5–10.5)

## 2013-11-02 MED ORDER — FLUTICASONE PROPIONATE 0.05 % EX CREA: TOPICAL_CREAM | Freq: Two times a day (BID) | CUTANEOUS | Status: DC

## 2013-11-02 MED ORDER — CLORAZEPATE DIPOTASSIUM 7.5 MG PO TABS: 7.5000 mg | ORAL_TABLET | Freq: Two times a day (BID) | ORAL | Status: DC | PRN

## 2013-11-02 NOTE — Progress Notes (Signed)
Subjective:    HPI  The patient is here for a wellness exam. The patient has been doing well overall without major physical or psychological issues going on lately.  The patient needs to address  chronic hypertension that has not  been well controlled with medicines; to address chronic  HTN, hyperlipidemia controlled with medicines ; and to address OA, controlled with medical treatment and diet.   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 He has been working less on his tomato plants in the garden   BP Readings from Last 3 Encounters:  11/02/13 148/80  06/29/13 110/68  05/25/13 142/70   Wt Readings from Last 3 Encounters:  11/02/13 196 lb (88.905 kg)  06/29/13 189 lb (85.73 kg)  05/25/13 188 lb (85.276 kg)      Review of Systems  Constitutional: Negative for appetite change, fatigue and unexpected weight change.  HENT: Negative for congestion, nosebleeds, sneezing, sore throat and trouble swallowing.   Eyes: Positive for visual disturbance. Negative for itching.  Respiratory: Negative for cough and shortness of breath.   Cardiovascular: Negative for chest pain, palpitations and leg swelling.  Gastrointestinal: Negative for nausea, diarrhea, blood in stool and abdominal distention.  Genitourinary: Negative for frequency and hematuria.  Musculoskeletal: Positive for gait problem. Negative for back pain, joint swelling and neck pain.  Skin: Negative for rash.  Neurological: Negative for dizziness, tremors, speech difficulty and weakness.  Psychiatric/Behavioral: Negative for sleep disturbance, dysphoric mood and agitation. The patient is not nervous/anxious.        Objective:   Physical Exam  Constitutional: He is oriented to person, place, and time. He appears well-developed.  HENT:  Mouth/Throat: Oropharynx is clear and moist.  Eyes: Conjunctivae are normal. Pupils are equal, round, and  reactive to light.  R eye is scarred  Neck: Normal range of motion. No JVD present. No thyromegaly present.  Cardiovascular: Normal rate and intact distal pulses.  Exam reveals no gallop and no friction rub.   Murmur (1/6 m) heard. Pulmonary/Chest: Effort normal and breath sounds normal. No respiratory distress. He has no wheezes. He has no rales. He exhibits no tenderness.  Abdominal: Soft. Bowel sounds are normal. He exhibits no distension and no mass. There is no tenderness. There is no rebound and no guarding.  Musculoskeletal: Normal range of motion. He exhibits no edema and no tenderness.  Lymphadenopathy:    He has no cervical adenopathy.  Neurological: He is alert and oriented to person, place, and time. He has normal reflexes. No cranial nerve deficit. He exhibits normal muscle tone. Coordination normal.  Skin: Skin is warm and dry. Rash (L dist wrist - ?ca, large; AKs on scalp) noted. He is not diaphoretic.  Psychiatric: He has a normal mood and affect. His behavior is normal. Judgment and thought content normal.   L shoulder with pain and restriction of ROM. R shoulder is stiff Thick toenails No edema  Lab Results  Component Value Date   WBC 5.0 11/28/2011   HGB 13.9 10/13/2012   HCT 41.0 10/13/2012   PLT 126.0* 11/28/2011   GLUCOSE 89 03/29/2013   CHOL 110 05/25/2013   TRIG 83.0 05/25/2013   HDL 39.00* 05/25/2013   LDLCALC 54 05/25/2013   ALT 18 03/29/2013   AST 24 03/29/2013   NA 137 03/29/2013   K 5.3* 03/29/2013   CL 105 03/29/2013   CREATININE  1.7* 03/29/2013   BUN 43* 03/29/2013   CO2 25 03/29/2013   TSH 2.30 11/28/2011   PSA 0.25 11/28/2011         Assessment & Plan:

## 2013-11-02 NOTE — Assessment & Plan Note (Signed)
No change 

## 2013-11-02 NOTE — Progress Notes (Signed)
Pre visit review using our clinic review tool, if applicable. No additional management support is needed unless otherwise documented below in the visit note. 

## 2013-11-02 NOTE — Assessment & Plan Note (Signed)
Continue with current prescription therapy as reflected on the Med list.  

## 2013-11-02 NOTE — Assessment & Plan Note (Signed)
Changed to Clorazepate due to insurance

## 2013-11-02 NOTE — Assessment & Plan Note (Signed)
Continue with current prescription therapy as reflected on the Med list. Loose wt NAS  BP Readings from Last 3 Encounters:  11/02/13 148/80  06/29/13 110/68  05/25/13 142/70

## 2013-11-02 NOTE — Assessment & Plan Note (Signed)
Here for medicare wellness/physical  Diet: heart healthy  Physical activity: not sedentary in warm weather Depression/mood screen: negative  Hearing: intact to whispered voice  Visual acuity: grossly normal, performs annual eye exam  ADLs: capable  Fall risk: none  Home safety: good  Cognitive evaluation: intact to orientation, naming, recall and repetition  EOL planning: adv directives, full code/ I agree  I have personally reviewed and have noted  1. The patient's medical and social history  2. Their use of alcohol, tobacco or illicit drugs  3. Their current medications and supplements  4. The patient's functional ability including ADL's, fall risks, home safety risks and hearing or visual impairment.  5. Diet and physical activities  6. Evidence for depression or mood disorders    Today patient counseled on age appropriate routine health concerns for screening and prevention, each reviewed and up to date or declined. Immunizations reviewed and up to date or declined. Labs ordered and reviewed. Risk factors for depression reviewed and negative. Hearing function and visual acuity are intact. ADLs screened and addressed as needed. Functional ability and level of safety reviewed and appropriate. Education, counseling and referrals performed based on assessed risks today. Patient provided with a copy of personalized plan for preventive services.

## 2013-11-02 NOTE — Assessment & Plan Note (Signed)
Fluticasone prn 

## 2013-11-04 ENCOUNTER — Other Ambulatory Visit: Payer: Self-pay | Admitting: *Deleted

## 2013-11-04 DIAGNOSIS — E875 Hyperkalemia: Secondary | ICD-10-CM

## 2013-11-17 ENCOUNTER — Other Ambulatory Visit (INDEPENDENT_AMBULATORY_CARE_PROVIDER_SITE_OTHER): Payer: Medicare Other

## 2013-11-17 DIAGNOSIS — E875 Hyperkalemia: Secondary | ICD-10-CM

## 2013-11-17 LAB — BASIC METABOLIC PANEL
BUN: 30 mg/dL — ABNORMAL HIGH (ref 6–23)
CO2: 29 mEq/L (ref 19–32)
Chloride: 106 mEq/L (ref 96–112)
Potassium: 4.8 mEq/L (ref 3.5–5.1)

## 2013-11-26 ENCOUNTER — Other Ambulatory Visit: Payer: Self-pay

## 2013-11-26 MED ORDER — SIMVASTATIN 40 MG PO TABS: 20.0000 mg | ORAL_TABLET | Freq: Every day | ORAL | Status: DC

## 2013-12-07 ENCOUNTER — Telehealth: Payer: Self-pay | Admitting: Internal Medicine

## 2013-12-07 NOTE — Telephone Encounter (Signed)
[name] [name] has [employer].  He states that Alprazolam has been dropped from their list.  What will he need to do?

## 2013-12-07 NOTE — Telephone Encounter (Signed)
Did his Insurance suggest any alternatives? If not - they imply that he has to pay for it himself. Sorry!

## 2013-12-09 ENCOUNTER — Other Ambulatory Visit: Payer: Self-pay

## 2013-12-09 MED ORDER — METOPROLOL SUCCINATE ER 50 MG PO TB24: 50.0000 mg | ORAL_TABLET | Freq: Every day | ORAL | Status: DC

## 2013-12-15 ENCOUNTER — Other Ambulatory Visit: Payer: Self-pay | Admitting: Internal Medicine

## 2013-12-16 NOTE — Telephone Encounter (Signed)
Ok to cont Rx He will need to pay cash Thx

## 2013-12-16 NOTE — Telephone Encounter (Signed)
He knows no covered alternatives. Please advise. He states he is ok with paying for med himself if needed.

## 2013-12-21 ENCOUNTER — Ambulatory Visit (INDEPENDENT_AMBULATORY_CARE_PROVIDER_SITE_OTHER): Payer: Medicare Other | Admitting: Internal Medicine

## 2013-12-21 ENCOUNTER — Encounter: Payer: Self-pay | Admitting: Internal Medicine

## 2013-12-21 VITALS — BP 190/90 | HR 76 | Temp 97.0°F | Resp 16

## 2013-12-21 DIAGNOSIS — W19XXXA Unspecified fall, initial encounter: Secondary | ICD-10-CM

## 2013-12-21 DIAGNOSIS — S0003XA Contusion of scalp, initial encounter: Secondary | ICD-10-CM

## 2013-12-21 DIAGNOSIS — Y92009 Unspecified place in unspecified non-institutional (private) residence as the place of occurrence of the external cause: Secondary | ICD-10-CM

## 2013-12-21 DIAGNOSIS — S0083XA Contusion of other part of head, initial encounter: Secondary | ICD-10-CM | POA: Insufficient documentation

## 2013-12-21 DIAGNOSIS — S61409A Unspecified open wound of unspecified hand, initial encounter: Secondary | ICD-10-CM

## 2013-12-21 DIAGNOSIS — S61411A Laceration without foreign body of right hand, initial encounter: Secondary | ICD-10-CM | POA: Insufficient documentation

## 2013-12-21 DIAGNOSIS — S1093XA Contusion of unspecified part of neck, initial encounter: Secondary | ICD-10-CM

## 2013-12-21 MED ORDER — MUPIROCIN 2 % EX OINT: TOPICAL_OINTMENT | CUTANEOUS | Status: DC

## 2013-12-21 MED ORDER — CEPHALEXIN 500 MG PO CAPS: 500.0000 mg | ORAL_CAPSULE | Freq: Four times a day (QID) | ORAL | Status: DC

## 2013-12-21 MED ORDER — TRAMADOL HCL 50 MG PO TABS: 50.0000 mg | ORAL_TABLET | Freq: Four times a day (QID) | ORAL | Status: DC | PRN

## 2013-12-21 NOTE — Assessment & Plan Note (Addendum)
1/15   See procedure

## 2013-12-21 NOTE — Assessment & Plan Note (Signed)
12/21/13 R - large Ice Call if issues. Go to ER if needed

## 2013-12-21 NOTE — Patient Instructions (Signed)
   Wound instructions: clean wound daily with soap and water, pat dry with a gauze or a kleenex tissue. Dress with antibiotic ointment and a band aid. Return to clinic for suture removal as instructed.

## 2013-12-21 NOTE — Progress Notes (Signed)
Pre visit review using our clinic review tool, if applicable. No additional management support is needed unless otherwise documented below in the visit note. 

## 2013-12-21 NOTE — Assessment & Plan Note (Signed)
12/21/13   No evidence of concussion

## 2013-12-21 NOTE — Progress Notes (Signed)
Subjective:    HPI  The patient walked in here following a fall at his dtr's house - tripped over O2 tank. He hit and cut his R hand and had a "goose egg" over his R forehead. No LOC  The patient needs to address  chronic hypertension that has not  been well controlled with medicines; to address chronic  HTN, hyperlipidemia controlled with medicines ; and to address OA, controlled with medical treatment and diet.   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   BP Readings from Last 3 Encounters:  12/21/13 190/90  11/02/13 148/80  06/29/13 110/68   Wt Readings from Last 3 Encounters:  11/02/13 196 lb (88.905 kg)  06/29/13 189 lb (85.73 kg)  05/25/13 188 lb (85.276 kg)      Review of Systems  Constitutional: Negative for appetite change, fatigue and unexpected weight change.  HENT: Negative for congestion, nosebleeds, sneezing, sore throat and trouble swallowing.   Eyes: Positive for visual disturbance. Negative for itching.  Respiratory: Negative for cough and shortness of breath.   Cardiovascular: Negative for chest pain, palpitations and leg swelling.  Gastrointestinal: Negative for nausea, diarrhea, blood in stool and abdominal distention.  Genitourinary: Negative for frequency and hematuria.  Musculoskeletal: Positive for gait problem. Negative for back pain, joint swelling and neck pain.  Skin: Negative for rash.  Neurological: Negative for dizziness, tremors, speech difficulty and weakness.  Psychiatric/Behavioral: Negative for sleep disturbance, dysphoric mood and agitation. The patient is not nervous/anxious.        Objective:   Physical Exam  Constitutional: He is oriented to person, place, and time. He appears well-developed.  HENT:  Mouth/Throat: Oropharynx is clear and moist.  Eyes: Conjunctivae are normal. Pupils are equal, round, and reactive to light.  R eye is scarred   Neck: Normal range of motion. No JVD present. No thyromegaly present.  Cardiovascular: Normal rate and intact distal pulses.  Exam reveals no gallop and no friction rub.   Murmur (1/6 m) heard. Pulmonary/Chest: Effort normal and breath sounds normal. No respiratory distress. He has no wheezes. He has no rales. He exhibits no tenderness.  Abdominal: Soft. Bowel sounds are normal. He exhibits no distension and no mass. There is no tenderness. There is no rebound and no guarding.  Musculoskeletal: Normal range of motion. He exhibits no edema and no tenderness.  Lymphadenopathy:    He has no cervical adenopathy.  Neurological: He is alert and oriented to person, place, and time. He has normal reflexes. No cranial nerve deficit. He exhibits normal muscle tone. Coordination normal.  Skin: Skin is warm and dry. Rash (L dist wrist - ?ca, large; AKs on scalp) noted. He is not diaphoretic.  Psychiatric: He has a normal mood and affect. His behavior is normal. Judgment and thought content normal.   L shoulder with pain and restriction of ROM. 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  Lab Results  Component Value Date   WBC 5.3 11/02/2013   HGB 14.5 11/02/2013   HCT 42.8 11/02/2013   PLT 117.0* 11/02/2013   GLUCOSE 90 11/17/2013   CHOL  110 05/25/2013   TRIG 83.0 05/25/2013   HDL 39.00* 05/25/2013   LDLCALC 54 05/25/2013   ALT 14 11/02/2013   AST 21 11/02/2013   NA 139 11/17/2013   K 4.8 11/17/2013   CL 106 11/17/2013   CREATININE 1.6* 11/17/2013   BUN 30* 11/17/2013   CO2 29 11/17/2013   TSH 3.30 11/02/2013   PSA 0.25 11/28/2011     Procedure: laceration repair R hand -- 6 cm Indication: deep skin laceration Risks including infection, bleeding, scar  formation, wound opening as well as benefits were explained to the patient in details. Wound was anesthetized with 4 cc of 2% Lidocaine with epinephrine and irrigated with the rest of the anesthetic. Skin was cleaned with betadine and dressed with sterile fenestrated field.   8 simple sutures with 3.0 nylon were applied.  Wound was dressed with antibiotic ointment and dressed with Telfa and Coban Tolerated well. Complications: none.   Wound instructions: clean wound daily with soap and water, pat dry with a gauze or a kleenex tissue. Dress with antibiotic ointment and a band aid. Return to clinic for suture removal as instructed.     Assessment & Plan:

## 2013-12-22 ENCOUNTER — Telehealth: Payer: Self-pay | Admitting: *Deleted

## 2013-12-22 NOTE — Telephone Encounter (Signed)
I called pt to see how he is today. He states his eye is not as swollen. His hand has been oozing a little blood. He states he used it a little earlier. I advised him to keep it elevated and to stay still and rest. I advised him to change his dressing on his hand this afternoon and apply the abx ointment that is at his pharmacy. I also advised him to call us if needed. He verbalized understanding and thanked me for calling to check on him.

## 2013-12-22 NOTE — Telephone Encounter (Signed)
Thx

## 2014-01-04 ENCOUNTER — Ambulatory Visit: Payer: Medicare Other | Admitting: Internal Medicine

## 2014-01-05 ENCOUNTER — Encounter: Payer: Self-pay | Admitting: Internal Medicine

## 2014-01-05 ENCOUNTER — Ambulatory Visit (INDEPENDENT_AMBULATORY_CARE_PROVIDER_SITE_OTHER): Payer: Medicare Other | Admitting: Internal Medicine

## 2014-01-05 VITALS — BP 130/70 | HR 68 | Temp 97.6°F | Resp 16 | Wt 195.0 lb

## 2014-01-05 DIAGNOSIS — S61411A Laceration without foreign body of right hand, initial encounter: Secondary | ICD-10-CM

## 2014-01-05 DIAGNOSIS — S61409A Unspecified open wound of unspecified hand, initial encounter: Secondary | ICD-10-CM

## 2014-01-05 NOTE — Progress Notes (Signed)
Pre visit review using our clinic review tool, if applicable. No additional management support is needed unless otherwise documented below in the visit note. 

## 2014-01-05 NOTE — Assessment & Plan Note (Signed)
Sutures removed ACE wrap

## 2014-01-05 NOTE — Progress Notes (Signed)
Sutures removed. Wound dressed. ACE wrap applied.

## 2014-03-08 ENCOUNTER — Encounter: Payer: Self-pay | Admitting: Internal Medicine

## 2014-03-08 ENCOUNTER — Ambulatory Visit (INDEPENDENT_AMBULATORY_CARE_PROVIDER_SITE_OTHER)
Admission: RE | Admit: 2014-03-08 | Discharge: 2014-03-08 | Disposition: A | Discharge status: Home / Self Care | Payer: Medicare Other | Source: Ambulatory Visit | Attending: Internal Medicine | Admitting: Internal Medicine

## 2014-03-08 ENCOUNTER — Other Ambulatory Visit (INDEPENDENT_AMBULATORY_CARE_PROVIDER_SITE_OTHER): Payer: Medicare Other

## 2014-03-08 ENCOUNTER — Ambulatory Visit (INDEPENDENT_AMBULATORY_CARE_PROVIDER_SITE_OTHER): Payer: Medicare Other | Admitting: Internal Medicine

## 2014-03-08 VITALS — BP 130/80 | HR 60 | Temp 97.3°F | Wt 193.4 lb

## 2014-03-08 DIAGNOSIS — I2510 Atherosclerotic heart disease of native coronary artery without angina pectoris: Secondary | ICD-10-CM

## 2014-03-08 DIAGNOSIS — I428 Other cardiomyopathies: Secondary | ICD-10-CM

## 2014-03-08 DIAGNOSIS — R0989 Other specified symptoms and signs involving the circulatory and respiratory systems: Secondary | ICD-10-CM

## 2014-03-08 DIAGNOSIS — R209 Unspecified disturbances of skin sensation: Secondary | ICD-10-CM

## 2014-03-08 DIAGNOSIS — R202 Paresthesia of skin: Secondary | ICD-10-CM

## 2014-03-08 DIAGNOSIS — M25519 Pain in unspecified shoulder: Secondary | ICD-10-CM

## 2014-03-08 DIAGNOSIS — L259 Unspecified contact dermatitis, unspecified cause: Secondary | ICD-10-CM

## 2014-03-08 DIAGNOSIS — R0609 Other forms of dyspnea: Secondary | ICD-10-CM

## 2014-03-08 DIAGNOSIS — R0600 Dyspnea, unspecified: Secondary | ICD-10-CM

## 2014-03-08 DIAGNOSIS — I509 Heart failure, unspecified: Secondary | ICD-10-CM

## 2014-03-08 DIAGNOSIS — L570 Actinic keratosis: Secondary | ICD-10-CM

## 2014-03-08 DIAGNOSIS — I10 Essential (primary) hypertension: Secondary | ICD-10-CM

## 2014-03-08 DIAGNOSIS — M25511 Pain in right shoulder: Secondary | ICD-10-CM

## 2014-03-08 DIAGNOSIS — Z0000 Encounter for general adult medical examination without abnormal findings: Secondary | ICD-10-CM

## 2014-03-08 DIAGNOSIS — F341 Dysthymic disorder: Secondary | ICD-10-CM

## 2014-03-08 DIAGNOSIS — N320 Bladder-neck obstruction: Secondary | ICD-10-CM

## 2014-03-08 DIAGNOSIS — L309 Dermatitis, unspecified: Secondary | ICD-10-CM

## 2014-03-08 LAB — CBC WITH DIFFERENTIAL/PLATELET
BASOS ABS: 0 10*3/uL (ref 0.0–0.1)
Basophils Relative: 0.5 % (ref 0.0–3.0)
Eosinophils Absolute: 0.3 10*3/uL (ref 0.0–0.7)
Eosinophils Relative: 4 % (ref 0.0–5.0)
HCT: 44.4 % (ref 39.0–52.0)
Hemoglobin: 14.9 g/dL (ref 13.0–17.0)
LYMPHS PCT: 17.4 % (ref 12.0–46.0)
Lymphs Abs: 1.1 10*3/uL (ref 0.7–4.0)
MCHC: 33.5 g/dL (ref 30.0–36.0)
MCV: 94.5 fl (ref 78.0–100.0)
MONO ABS: 0.5 10*3/uL (ref 0.1–1.0)
Monocytes Relative: 8.5 % (ref 3.0–12.0)
Neutro Abs: 4.3 10*3/uL (ref 1.4–7.7)
Neutrophils Relative %: 69.6 % (ref 43.0–77.0)
PLATELETS: 126 10*3/uL — AB (ref 150.0–400.0)
RBC: 4.69 Mil/uL (ref 4.22–5.81)
RDW: 14.2 % (ref 11.5–14.6)
WBC: 6.2 10*3/uL (ref 4.5–10.5)

## 2014-03-08 LAB — BASIC METABOLIC PANEL
BUN: 40 mg/dL — AB (ref 6–23)
CALCIUM: 9 mg/dL (ref 8.4–10.5)
CHLORIDE: 108 meq/L (ref 96–112)
CO2: 26 meq/L (ref 19–32)
CREATININE: 1.5 mg/dL (ref 0.4–1.5)
GFR: 46.51 mL/min — ABNORMAL LOW (ref >60.00)
Glucose, Bld: 107 mg/dL — ABNORMAL HIGH (ref 70–99)
Potassium: 5 mEq/L (ref 3.5–5.1)
Sodium: 142 mEq/L (ref 135–145)

## 2014-03-08 LAB — VITAMIN B12: Vitamin B-12: 549 pg/mL (ref 211–911)

## 2014-03-08 LAB — PSA: PSA: 0.24 ng/mL (ref 0.10–4.00)

## 2014-03-08 LAB — HEPATIC FUNCTION PANEL
ALK PHOS: 104 U/L (ref 39–117)
ALT: 23 U/L (ref 0–53)
AST: 27 U/L (ref 0–37)
Albumin: 3.8 g/dL (ref 3.5–5.2)
BILIRUBIN DIRECT: 0.3 mg/dL (ref 0.0–0.3)
BILIRUBIN TOTAL: 1.8 mg/dL — AB (ref 0.3–1.2)
TOTAL PROTEIN: 6.5 g/dL (ref 6.0–8.3)

## 2014-03-08 LAB — TSH: TSH: 1.74 u[IU]/mL (ref 0.35–5.50)

## 2014-03-08 MED ORDER — FUROSEMIDE 20 MG PO TABS: 20.0000 mg | ORAL_TABLET | Freq: Every day | ORAL | Status: DC

## 2014-03-08 MED ORDER — FLUTICASONE PROPIONATE 0.05 % EX CREA: TOPICAL_CREAM | Freq: Two times a day (BID) | CUTANEOUS | Status: DC

## 2014-03-08 NOTE — Assessment & Plan Note (Signed)
Continue with current prescription therapy as reflected on the Med list.  

## 2014-03-08 NOTE — Progress Notes (Signed)
Subjective:    HPI  The patient needs to address  chronic hypertension that has not  been well controlled with medicines; to address chronic  HTN, hyperlipidemia controlled with medicines ; and to address OA, controlled with medical treatment and diet.   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   BP Readings from Last 3 Encounters:  03/08/14 130/80  01/05/14 130/70  12/21/13 190/90   Wt Readings from Last 3 Encounters:  03/08/14 193 lb 6 oz (87.714 kg)  01/05/14 195 lb (88.451 kg)  11/02/13 196 lb (88.905 kg)      Review of Systems  Constitutional: Negative for appetite change, fatigue and unexpected weight change.  HENT: Negative for congestion, nosebleeds, sneezing, sore throat and trouble swallowing.   Eyes: Positive for visual disturbance. Negative for itching.  Respiratory: Negative for cough and shortness of breath.   Cardiovascular: Negative for chest pain, palpitations and leg swelling.  Gastrointestinal: Negative for nausea, diarrhea, blood in stool and abdominal distention.  Genitourinary: Negative for frequency and hematuria.  Musculoskeletal: Positive for gait problem. Negative for back pain, joint swelling and neck pain.  Skin: Negative for rash.  Neurological: Negative for dizziness, tremors, speech difficulty and weakness.  Psychiatric/Behavioral: Negative for sleep disturbance, dysphoric mood and agitation. The patient is not nervous/anxious.        Objective:   Physical Exam  Constitutional: He is oriented to person, place, and time. He appears well-developed.  HENT:  Mouth/Throat: Oropharynx is clear and moist.  Eyes: Conjunctivae are normal. Pupils are equal, round, and reactive to light.  R eye is scarred  Neck: Normal range of motion. No JVD present. No thyromegaly present.  Cardiovascular: Normal rate and intact distal pulses.  Exam reveals no gallop and no  friction rub.   Murmur (1/6 m) heard. Pulmonary/Chest: Effort normal and breath sounds normal. No respiratory distress. He has no wheezes. He has no rales. He exhibits no tenderness.  Abdominal: Soft. Bowel sounds are normal. He exhibits no distension and no mass. There is no tenderness. There is no rebound and no guarding.  Musculoskeletal: Normal range of motion. He exhibits no edema and no tenderness.  Lymphadenopathy:    He has no cervical adenopathy.  Neurological: He is alert and oriented to person, place, and time. He has normal reflexes. No cranial nerve deficit. He exhibits normal muscle tone. Coordination normal.  Skin: Skin is warm and dry. Rash (L dist wrist - ?ca, large; AKs on scalp) noted. He is not diaphoretic.  Psychiatric: He has a normal mood and affect. His behavior is normal. Judgment and thought content normal.   L shoulder with pain and restriction of ROM. R shoulder is stiff Thick toenails No edema Face AKs x 4  Lab Results  Component Value Date   WBC 5.3 11/02/2013   HGB 14.5 11/02/2013   HCT 42.8 11/02/2013   PLT 117.0* 11/02/2013   GLUCOSE 90 11/17/2013   CHOL 110 05/25/2013   TRIG 83.0 05/25/2013   HDL 39.00* 05/25/2013   LDLCALC 54 05/25/2013   ALT 14 11/02/2013   AST 21 11/02/2013   NA 139 11/17/2013   K 4.8 11/17/2013   CL 106 11/17/2013   CREATININE 1.6* 11/17/2013   BUN 30* 11/17/2013   CO2 29 11/17/2013   TSH 3.30 11/02/2013   PSA 0.25 11/28/2011    Procedure Note :  Procedure : Cryosurgery   Indication: Actinic keratosis(es)   Risks including unsuccessful procedure , bleeding, infection, bruising, scar, a need for a repeat  procedure and others were explained to the patient in detail as well as the benefits. Informed consent was obtained verbally.    4 lesion(s)  on face   was/were treated with liquid nitrogen on a Q-tip in a usual fasion . Band-Aid was applied and antibiotic ointment was given for a later use.   Tolerated well.  Complications none.   Postprocedure instructions :     Keep the wounds clean. You can wash them with liquid soap and water. Pat dry with gauze or a Kleenex tissue  Before applying antibiotic ointment and a Band-Aid.   You need to report immediately  if  any signs of infection develop.       Assessment & Plan:

## 2014-03-08 NOTE — Patient Instructions (Signed)
   Postprocedure instructions :     Keep the wounds clean. You can wash them with liquid soap and water. Pat dry with gauze or a Kleenex tissue  Before applying antibiotic ointment and a Band-Aid.   You need to report immediately  if  any signs of infection develop.    

## 2014-03-08 NOTE — Assessment & Plan Note (Signed)
Labs CXR Rest

## 2014-03-08 NOTE — Assessment & Plan Note (Signed)
Added Lasix

## 2014-03-08 NOTE — Progress Notes (Signed)
Pre visit review using our clinic review tool, if applicable. No additional management support is needed unless otherwise documented below in the visit note. 

## 2014-03-09 ENCOUNTER — Telehealth: Payer: Self-pay | Admitting: Internal Medicine

## 2014-03-09 ENCOUNTER — Encounter: Payer: Self-pay | Admitting: *Deleted

## 2014-03-09 NOTE — Telephone Encounter (Signed)
Relevant patient education mailed to patient.  

## 2014-03-15 ENCOUNTER — Other Ambulatory Visit: Payer: Self-pay | Admitting: Internal Medicine

## 2014-03-22 ENCOUNTER — Ambulatory Visit (INDEPENDENT_AMBULATORY_CARE_PROVIDER_SITE_OTHER): Payer: Medicare Other | Admitting: Internal Medicine

## 2014-03-22 ENCOUNTER — Encounter: Payer: Self-pay | Admitting: Internal Medicine

## 2014-03-22 ENCOUNTER — Ambulatory Visit (INDEPENDENT_AMBULATORY_CARE_PROVIDER_SITE_OTHER)
Admission: RE | Admit: 2014-03-22 | Discharge: 2014-03-22 | Disposition: A | Discharge status: Home / Self Care | Payer: Medicare Other | Source: Ambulatory Visit | Attending: Internal Medicine | Admitting: Internal Medicine

## 2014-03-22 VITALS — BP 120/68 | HR 72 | Temp 98.0°F | Resp 16 | Wt 192.0 lb

## 2014-03-22 DIAGNOSIS — I428 Other cardiomyopathies: Secondary | ICD-10-CM

## 2014-03-22 DIAGNOSIS — L570 Actinic keratosis: Secondary | ICD-10-CM

## 2014-03-22 DIAGNOSIS — I509 Heart failure, unspecified: Secondary | ICD-10-CM

## 2014-03-22 DIAGNOSIS — I10 Essential (primary) hypertension: Secondary | ICD-10-CM

## 2014-03-22 DIAGNOSIS — I2510 Atherosclerotic heart disease of native coronary artery without angina pectoris: Secondary | ICD-10-CM

## 2014-03-22 DIAGNOSIS — R0609 Other forms of dyspnea: Secondary | ICD-10-CM

## 2014-03-22 DIAGNOSIS — R0600 Dyspnea, unspecified: Secondary | ICD-10-CM

## 2014-03-22 DIAGNOSIS — R0989 Other specified symptoms and signs involving the circulatory and respiratory systems: Secondary | ICD-10-CM

## 2014-03-22 MED ORDER — ALPRAZOLAM 0.25 MG PO TABS: 0.2500 mg | ORAL_TABLET | Freq: Every evening | ORAL | Status: DC | PRN

## 2014-03-22 NOTE — Progress Notes (Signed)
   Subjective:    HPI  F/u CHF/SOB - better F/u insomnia - chlorazepate caused bad dreams  The patient needs to address  chronic hypertension that has not  been well controlled with medicines; to address chronic  HTN, hyperlipidemia controlled with medicines ; and to address OA, controlled with medical treatment and diet.   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   BP Readings from Last 3 Encounters:  03/22/14 120/68  03/08/14 130/80  01/05/14 130/70   Wt Readings from Last 3 Encounters:  03/22/14 192 lb (87.091 kg)  03/08/14 193 lb 6 oz (87.714 kg)  01/05/14 195 lb (88.451 kg)      Review of Systems     Objective:   Physical Exam L shoulder with pain and restriction of ROM. R shoulder is stiff Thick toenails No edema Face AKs x 4  Lab Results  Component Value Date   WBC 6.2 03/08/2014   HGB 14.9 03/08/2014   HCT 44.4 03/08/2014   PLT 126.0* 03/08/2014   GLUCOSE 107* 03/08/2014   CHOL 110 05/25/2013   TRIG 83.0 05/25/2013   HDL 39.00* 05/25/2013   LDLCALC 54 05/25/2013   ALT 23 03/08/2014   AST 27 03/08/2014   NA 142 03/08/2014   K 5.0 03/08/2014   CL 108 03/08/2014   CREATININE 1.5 03/08/2014   BUN 40* 03/08/2014   CO2 26 03/08/2014   TSH 1.74 03/08/2014   PSA 0.24 03/08/2014    Procedure Note :     Procedure : Cryosurgery   Indication: Actinic keratosis(es)   Risks including unsuccessful procedure , bleeding, infection, bruising, scar, a need for a repeat  procedure and others were explained to the patient in detail as well as the benefits. Informed consent was obtained verbally.    1 lesion(s) on scalp   was/were treated with liquid nitrogen on a Q-tip in a usual fasion . Band-Aid was applied and antibiotic ointment was given for a later use.   Tolerated well. Complications none.   Postprocedure instructions :     Keep the wounds clean. You can wash them with liquid soap and water.  Pat dry with gauze or a Kleenex tissue  Before applying antibiotic ointment and a Band-Aid.   You need to report immediately  if  any signs of infection develop.       Assessment & Plan:

## 2014-03-22 NOTE — Assessment & Plan Note (Signed)
Continue with current prescription therapy as reflected on the Med list.  

## 2014-03-22 NOTE — Patient Instructions (Signed)
   Postprocedure instructions :     Keep the wounds clean. You can wash them with liquid soap and water. Pat dry with gauze or a Kleenex tissue  Before applying antibiotic ointment and a Band-Aid.   You need to report immediately  if  any signs of infection develop.    

## 2014-03-22 NOTE — Assessment & Plan Note (Signed)
Cryo  

## 2014-03-22 NOTE — Assessment & Plan Note (Signed)
CXR Better  Continue with current prescription therapy as reflected on the Med list.

## 2014-03-22 NOTE — Assessment & Plan Note (Signed)
Better Continue with current prescription therapy as reflected on the Med list.  

## 2014-03-22 NOTE — Progress Notes (Signed)
Pre visit review using our clinic review tool, if applicable. No additional management support is needed unless otherwise documented below in the visit note. 

## 2014-05-10 ENCOUNTER — Ambulatory Visit: Payer: Medicare Other | Admitting: Internal Medicine

## 2014-05-16 ENCOUNTER — Other Ambulatory Visit: Payer: Self-pay | Admitting: *Deleted

## 2014-05-16 MED ORDER — METOPROLOL SUCCINATE ER 50 MG PO TB24: 50.0000 mg | ORAL_TABLET | Freq: Every day | ORAL | Status: DC

## 2014-05-30 ENCOUNTER — Encounter: Payer: Self-pay | Admitting: Cardiology

## 2014-05-30 ENCOUNTER — Ambulatory Visit (INDEPENDENT_AMBULATORY_CARE_PROVIDER_SITE_OTHER): Payer: Medicare Other | Admitting: Cardiology

## 2014-05-30 VITALS — BP 136/66 | HR 53 | Ht 67.0 in | Wt 184.0 lb

## 2014-05-30 DIAGNOSIS — I428 Other cardiomyopathies: Secondary | ICD-10-CM

## 2014-05-30 DIAGNOSIS — I10 Essential (primary) hypertension: Secondary | ICD-10-CM

## 2014-05-30 DIAGNOSIS — I2510 Atherosclerotic heart disease of native coronary artery without angina pectoris: Secondary | ICD-10-CM

## 2014-05-30 MED ORDER — ALPRAZOLAM 1 MG PO TABS: 1.0000 mg | ORAL_TABLET | Freq: Every day | ORAL | Status: DC

## 2014-05-30 NOTE — Progress Notes (Signed)
HPI  The patient presents for yearly followup of his mildly reduced ejection fraction.  He has presumed coronary disease with ischemia on a stress perfusion study in 2008. We have managed this medically. He did have a fall since I last saw him but no syncope.  He's not describing any new shortness of breath, PND or orthopnea. He will rarely get some dyspnea.  He still gardening. He takes care of his wife who has medical issues and a daughter withf COPD. He does have joint problems not allowing him to lift his arms above his head but can do work below his waist.   Allergies  Allergen Reactions  . Lisinopril     cough    Current Outpatient Prescriptions  Medication Sig Dispense Refill  . ALPRAZolam (XANAX) 1 MG tablet Take 1 tablet (1 mg total) by mouth 2 (two) times daily as needed for sleep or anxiety.  60 tablet  3  . aspirin EC 81 MG tablet Take 81 mg by mouth daily.      . diclofenac (VOLTAREN) 75 MG EC tablet TAKE (1) TABLET TWICE A DAY WITH FOOD.  60 tablet  3  . gabapentin (NEURONTIN) 100 MG capsule Take 1 capsule (100 mg total) by mouth 3 (three) times daily as needed (neuropathy).  90 capsule  5  . losartan-hydrochlorothiazide (HYZAAR) 50-12.5 MG per tablet Take 0.5 tablets by mouth daily.  45 tablet  0  . metoprolol succinate (TOPROL-XL) 50 MG 24 hr tablet Take 1 tablet (50 mg total) by mouth daily.  90 tablet  3  . sildenafil (VIAGRA) 50 MG tablet Take 50 mg by mouth daily as needed. For intercourse.      . simvastatin (ZOCOR) 40 MG tablet Take 0.5 tablets (20 mg total) by mouth at bedtime.  90 tablet  3    Past Medical History  Diagnosis Date  . Hypertension   . Hyperlipidemia   . Coronary artery disease   . Cardiomyopathy   . Hemorrhoids     Past Surgical History  Procedure Laterality Date  . Tonsilectomy, adenoidectomy, bilateral myringotomy and tubes    . Colonic polyps    . Cholecystectomy    . Inguinal hernia repair      ROS:  As stated in the HPI and  negative for all other systems.  PHYSICAL EXAM BP 136/66  Pulse 53  Ht 5\' 7"  (1.702 m)  Wt 184 lb (83.462 kg)  BMI 28.81 kg/m2 GENERAL:  Well appearing HEENT:  Right lens opacification, fundi not visualized, oral mucosa unremarkable NECK:  No jugular venous distention, waveform within normal limits, carotid upstroke brisk and symmetric, no bruits, no thyromegaly LUNGS:  Clear to auscultation bilaterally HEART:  PMI not displaced or sustained,S1 and S2 within normal limits, no S3, no S4, no clicks, no rubs, no murmurs ABD:  Flat, positive bowel sounds normal in frequency in pitch, no bruits, no rebound, no guarding, no midline pulsatile mass, no hepatomegaly, no splenomegaly EXT:  2 plus pulses throughout, no edema, no cyanosis no clubbing SKIN:  No rashes no nodules, multiple bruises   EKG:  Bradycardia rate 53, right bundle branch block, no acute ST-T wave changes. PACs.  05/30/2014   ASSESSMENT AND PLAN  CAD:  The patient has no new sypmtoms.  No further cardiovascular testing is indicated.  We will continue with aggressive risk reduction and meds as listed.  HTN:   The blood pressure is at target. No change in medications is indicated. We  will continue with therapeutic lifestyle changes (TLC).  LIPIDS:  The patient will have a lipid profile. Further therapy will be based on this result.  INSOMNIA:  Since having a dose of Xanax reduced he has had great difficulty sleeping.  I took the liberty of increasing his dose.  This is still lower than his previous dose however.  I will otherwise defer to Walker Kehr, MD

## 2014-05-30 NOTE — Patient Instructions (Signed)
Your physician recommends that you schedule a follow-up appointment in: one year with Dr. Hochrein  

## 2014-06-30 ENCOUNTER — Other Ambulatory Visit: Payer: Self-pay | Admitting: *Deleted

## 2014-07-01 ENCOUNTER — Encounter: Payer: Self-pay | Admitting: Internal Medicine

## 2014-07-01 ENCOUNTER — Other Ambulatory Visit (INDEPENDENT_AMBULATORY_CARE_PROVIDER_SITE_OTHER): Payer: Medicare Other

## 2014-07-01 ENCOUNTER — Ambulatory Visit (INDEPENDENT_AMBULATORY_CARE_PROVIDER_SITE_OTHER): Payer: Medicare Other | Admitting: Internal Medicine

## 2014-07-01 VITALS — BP 112/74 | HR 64 | Temp 98.1°F | Resp 16 | Wt 185.0 lb

## 2014-07-01 DIAGNOSIS — I10 Essential (primary) hypertension: Secondary | ICD-10-CM

## 2014-07-01 DIAGNOSIS — E785 Hyperlipidemia, unspecified: Secondary | ICD-10-CM

## 2014-07-01 DIAGNOSIS — R21 Rash and other nonspecific skin eruption: Secondary | ICD-10-CM

## 2014-07-01 DIAGNOSIS — M19019 Primary osteoarthritis, unspecified shoulder: Secondary | ICD-10-CM

## 2014-07-01 DIAGNOSIS — I428 Other cardiomyopathies: Secondary | ICD-10-CM

## 2014-07-01 DIAGNOSIS — F341 Dysthymic disorder: Secondary | ICD-10-CM

## 2014-07-01 DIAGNOSIS — I509 Heart failure, unspecified: Secondary | ICD-10-CM

## 2014-07-01 LAB — BASIC METABOLIC PANEL
BUN: 15 mg/dL (ref 6–23)
CHLORIDE: 100 meq/L (ref 96–112)
CO2: 28 meq/L (ref 19–32)
Calcium: 9.5 mg/dL (ref 8.4–10.5)
Creatinine, Ser: 0.8 mg/dL (ref 0.4–1.5)
GFR: 95.99 mL/min (ref >60.00)
Glucose, Bld: 84 mg/dL (ref 70–99)
Potassium: 3.8 mEq/L (ref 3.5–5.1)
Sodium: 135 mEq/L (ref 135–145)

## 2014-07-01 MED ORDER — TRIAMCINOLONE ACETONIDE 0.5 % EX CREA: 1.0000 "application " | TOPICAL_CREAM | Freq: Four times a day (QID) | CUTANEOUS | Status: DC

## 2014-07-01 NOTE — Assessment & Plan Note (Signed)
Continue with current prescription therapy as reflected on the Med list.  

## 2014-07-01 NOTE — Assessment & Plan Note (Signed)
Triamc cream 

## 2014-07-01 NOTE — Progress Notes (Signed)
Pre visit review using our clinic review tool, if applicable. No additional management support is needed unless otherwise documented below in the visit note. 

## 2014-07-01 NOTE — Progress Notes (Signed)
Subjective:    HPI  F/u CHF/SOB - doing well F/u insomnia - chlorazepate caused bad dreams  The patient needs to address  chronic hypertension that has not  been well controlled with medicines; to address chronic  HTN, hyperlipidemia controlled with medicines ; and to address OA, controlled with medical treatment and diet.   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   BP Readings from Last 3 Encounters:  07/01/14 112/74  05/30/14 136/66  03/22/14 120/68   Wt Readings from Last 3 Encounters:  07/01/14 185 lb (83.915 kg)  05/30/14 184 lb (83.462 kg)  03/22/14 192 lb (87.091 kg)      Review of Systems  Constitutional: Negative for appetite change, fatigue and unexpected weight change.  HENT: Negative for congestion, nosebleeds, sneezing, sore throat, trouble swallowing and voice change.   Eyes: Negative for itching and visual disturbance.  Respiratory: Negative for cough.   Cardiovascular: Negative for chest pain, palpitations and leg swelling.  Gastrointestinal: Negative for nausea, diarrhea, blood in stool and abdominal distention.  Genitourinary: Negative for frequency and hematuria.  Musculoskeletal: Positive for arthralgias. Negative for back pain, gait problem, joint swelling and neck pain.  Skin: Positive for rash.  Neurological: Negative for dizziness, tremors, speech difficulty and weakness.  Psychiatric/Behavioral: Negative for sleep disturbance, dysphoric mood and agitation. The patient is not nervous/anxious.        Objective:   Physical Exam  Constitutional: He is oriented to person, place, and time. He appears well-developed. No distress.  NAD  HENT:  Mouth/Throat: Oropharynx is clear and moist.  Eyes: Conjunctivae are normal. Pupils are equal, round, and reactive to light.  Neck: Normal range of motion. No JVD present. No thyromegaly present.  Cardiovascular: Normal rate,  regular rhythm, normal heart sounds and intact distal pulses.  Exam reveals no gallop and no friction rub.   No murmur heard. Pulmonary/Chest: Effort normal and breath sounds normal. No respiratory distress. He has no wheezes. He has no rales. He exhibits no tenderness.  Abdominal: Soft. Bowel sounds are normal. He exhibits no distension and no mass. There is no tenderness. There is no rebound and no guarding.  Musculoskeletal: Normal range of motion. He exhibits tenderness. He exhibits no edema.  R shoulder w/poor rom  Lymphadenopathy:    He has no cervical adenopathy.  Neurological: He is alert and oriented to person, place, and time. He has normal reflexes. No cranial nerve deficit. He exhibits normal muscle tone. He displays a negative Romberg sign. Coordination and gait normal.  No meningeal signs  Skin: Skin is warm and dry. Rash noted.  Hives on R elbow  Psychiatric: He has a normal mood and affect. His behavior is normal. Judgment and thought content normal.     Lab Results  Component Value Date   WBC 6.2 03/08/2014   HGB 14.9 03/08/2014   HCT 44.4 03/08/2014   PLT 126.0* 03/08/2014   GLUCOSE 107* 03/08/2014   CHOL 110 05/25/2013   TRIG 83.0 05/25/2013   HDL 39.00* 05/25/2013   LDLCALC 54 05/25/2013   ALT 23 03/08/2014   AST 27 03/08/2014   NA 142 03/08/2014   K 5.0 03/08/2014   CL 108 03/08/2014   CREATININE 1.5 03/08/2014   BUN 40* 03/08/2014   CO2 26 03/08/2014   TSH 1.74 03/08/2014   PSA 0.24 03/08/2014  Assessment & Plan:

## 2014-07-11 ENCOUNTER — Other Ambulatory Visit: Payer: Self-pay

## 2014-07-11 MED ORDER — LOSARTAN POTASSIUM-HCTZ 50-12.5 MG PO TABS: 1.0000 | ORAL_TABLET | Freq: Every day | ORAL | Status: DC

## 2014-08-16 ENCOUNTER — Ambulatory Visit (INDEPENDENT_AMBULATORY_CARE_PROVIDER_SITE_OTHER): Payer: Medicare Other | Admitting: *Deleted

## 2014-08-16 DIAGNOSIS — Z23 Encounter for immunization: Secondary | ICD-10-CM

## 2014-09-15 ENCOUNTER — Other Ambulatory Visit: Payer: Self-pay | Admitting: Internal Medicine

## 2014-09-15 ENCOUNTER — Telehealth: Payer: Self-pay | Admitting: Cardiology

## 2014-09-15 NOTE — Telephone Encounter (Signed)
New Message      Pt calling stating that he wants to clarify what medications he is suppose to be taking. Please call pt back and advise.

## 2014-09-15 NOTE — Telephone Encounter (Signed)
Med is not in active med list. Please advise ok to Rf?

## 2014-09-15 NOTE — Telephone Encounter (Signed)
Pt. Recently lost his daughter and his wife, daughter died from complications with her copd and the wife was helping take care of her and suffered a cva and past away, pt. Was confused about his meds and I went over them with him

## 2014-09-16 ENCOUNTER — Telehealth: Payer: Self-pay | Admitting: Cardiology

## 2014-09-16 NOTE — Telephone Encounter (Signed)
Please have J C to call him,wants to ask him about some medicine.

## 2014-09-16 NOTE — Telephone Encounter (Signed)
Spoke with pt, he would like to know if dr hochrein can prescribe viagra for him. Will discuss with dr Percival Spanish

## 2014-09-21 NOTE — Telephone Encounter (Signed)
Routed to Dr. Percival Spanish to advise on viagra Rx.Marland Kitchen

## 2014-09-21 NOTE — Telephone Encounter (Signed)
OK to prescribe Viagra 50 mg po prn.  Disp number 20 with 3 refills.

## 2014-09-22 ENCOUNTER — Telehealth: Payer: Self-pay | Admitting: Cardiology

## 2014-09-22 MED ORDER — SILDENAFIL CITRATE 50 MG PO TABS: 50.0000 mg | ORAL_TABLET | Freq: Every day | ORAL | Status: DC | PRN

## 2014-09-22 NOTE — Telephone Encounter (Signed)
Pt is requesting a prescription for Viagra to be called in to [facility]

## 2014-09-22 NOTE — Telephone Encounter (Signed)
Pt PCP has called in a prescription for the pt before we could do so.

## 2014-09-22 NOTE — Telephone Encounter (Signed)
Returned call to patient no answer.Unable to leave a message no answering machine.

## 2014-09-22 NOTE — Telephone Encounter (Signed)
Returned call to patient Dr.Hochrein advised ok to take Viagra 50 mg as needed.Prescription sent to pharmacy.

## 2014-11-21 ENCOUNTER — Ambulatory Visit (INDEPENDENT_AMBULATORY_CARE_PROVIDER_SITE_OTHER): Payer: Medicare Other | Admitting: Internal Medicine

## 2014-11-21 ENCOUNTER — Encounter: Payer: Self-pay | Admitting: Internal Medicine

## 2014-11-21 VITALS — BP 120/58 | HR 54 | Temp 97.7°F | Wt 180.0 lb

## 2014-11-21 DIAGNOSIS — I10 Essential (primary) hypertension: Secondary | ICD-10-CM

## 2014-11-21 DIAGNOSIS — K130 Diseases of lips: Secondary | ICD-10-CM

## 2014-11-21 DIAGNOSIS — N529 Male erectile dysfunction, unspecified: Secondary | ICD-10-CM

## 2014-11-21 MED ORDER — VARDENAFIL HCL 20 MG PO TABS: 20.0000 mg | ORAL_TABLET | Freq: Every day | ORAL | Status: DC | PRN

## 2014-11-21 MED ORDER — CLOTRIMAZOLE-BETAMETHASONE 1-0.05 % EX CREA: 1.0000 "application " | TOPICAL_CREAM | Freq: Two times a day (BID) | CUTANEOUS | Status: DC

## 2014-11-21 NOTE — Progress Notes (Signed)
Pre visit review using our clinic review tool, if applicable. No additional management support is needed unless otherwise documented below in the visit note. 

## 2014-11-21 NOTE — Progress Notes (Signed)
Subjective:    HPI  [name] [name] lost his dtr and his wife in 2015  C/o chapped lips  F/u CHF/SOB - doing well F/u insomnia - chlorazepate caused bad dreams  The patient needs to address  chronic hypertension that has not  been well controlled with medicines; to address chronic  HTN, hyperlipidemia controlled with medicines ; and to address OA, controlled with medical treatment and diet.   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   BP Readings from Last 3 Encounters:  11/21/14 120/58  07/01/14 112/74  05/30/14 136/66   Wt Readings from Last 3 Encounters:  11/21/14 180 lb (81.647 kg)  07/01/14 185 lb (83.915 kg)  05/30/14 184 lb (83.462 kg)      Review of Systems  Constitutional: Negative for appetite change, fatigue and unexpected weight change.  HENT: Negative for congestion, nosebleeds, sneezing, sore throat, trouble swallowing and voice change.   Eyes: Negative for itching and visual disturbance.  Respiratory: Negative for cough.   Cardiovascular: Negative for chest pain, palpitations and leg swelling.  Gastrointestinal: Negative for nausea, diarrhea, blood in stool and abdominal distention.  Genitourinary: Negative for frequency and hematuria.  Musculoskeletal: Positive for arthralgias. Negative for back pain, joint swelling, gait problem and neck pain.  Skin: Positive for rash.  Neurological: Negative for dizziness, tremors, speech difficulty and weakness.  Psychiatric/Behavioral: Negative for sleep disturbance, dysphoric mood and agitation. The patient is not nervous/anxious.        Objective:   Physical Exam  Constitutional: He is oriented to person, place, and time. He appears well-developed. No distress.  NAD  HENT:  Mouth/Throat: Oropharynx is clear and moist.  Eyes: Conjunctivae are normal. Pupils are equal, round, and reactive to light.  Neck: Normal range of motion. No  JVD present. No thyromegaly present.  Cardiovascular: Normal rate, regular rhythm, normal heart sounds and intact distal pulses.  Exam reveals no gallop and no friction rub.   No murmur heard. Pulmonary/Chest: Effort normal and breath sounds normal. No respiratory distress. He has no wheezes. He has no rales. He exhibits no tenderness.  Abdominal: Soft. Bowel sounds are normal. He exhibits no distension and no mass. There is no tenderness. There is no rebound and no guarding.  Musculoskeletal: Normal range of motion. He exhibits no edema or tenderness.  Lymphadenopathy:    He has no cervical adenopathy.  Neurological: He is alert and oriented to person, place, and time. He has normal reflexes. No cranial nerve deficit. He exhibits normal muscle tone. He displays a negative Romberg sign. Coordination and gait normal.  No meningeal signs  Skin: Skin is warm and dry. No rash noted.  Psychiatric: He has a normal mood and affect. His behavior is normal. Judgment and thought content normal.   Lower lip is eryth w/angular stomatitis    Lab Results  Component Value Date   WBC 6.2 03/08/2014   HGB 14.9 03/08/2014   HCT 44.4 03/08/2014   PLT 126.0* 03/08/2014   GLUCOSE 84 07/01/2014   CHOL 110 05/25/2013   TRIG 83.0 05/25/2013   HDL 39.00* 05/25/2013   LDLCALC 54 05/25/2013   ALT 23 03/08/2014   AST 27 03/08/2014   NA 135 07/01/2014   K 3.8 07/01/2014   CL 100 07/01/2014   CREATININE 0.8 07/01/2014   BUN 15 07/01/2014   CO2 28 07/01/2014   TSH 1.74 03/08/2014  PSA 0.24 03/08/2014          Assessment & Plan:

## 2014-11-21 NOTE — Assessment & Plan Note (Signed)
Lotrisone bid  

## 2014-11-21 NOTE — Assessment & Plan Note (Signed)
Continue with current prescription therapy as reflected on the Med list.  

## 2014-11-21 NOTE — Assessment & Plan Note (Signed)
Levitra Risks discussed

## 2014-11-22 ENCOUNTER — Telehealth: Payer: Self-pay | Admitting: Internal Medicine

## 2014-11-22 ENCOUNTER — Encounter: Payer: Self-pay | Admitting: Internal Medicine

## 2014-11-22 NOTE — Telephone Encounter (Signed)
emmi emailed °

## 2014-12-07 ENCOUNTER — Other Ambulatory Visit: Payer: Self-pay | Admitting: Cardiology

## 2014-12-09 NOTE — Telephone Encounter (Signed)
OK to refill

## 2014-12-14 ENCOUNTER — Other Ambulatory Visit: Payer: Self-pay | Admitting: Internal Medicine

## 2014-12-26 ENCOUNTER — Encounter: Payer: Self-pay | Admitting: Internal Medicine

## 2014-12-26 ENCOUNTER — Ambulatory Visit (INDEPENDENT_AMBULATORY_CARE_PROVIDER_SITE_OTHER): Payer: Medicare Other | Admitting: Internal Medicine

## 2014-12-26 VITALS — BP 140/80 | HR 52 | Temp 97.7°F | Wt 183.0 lb

## 2014-12-26 DIAGNOSIS — I10 Essential (primary) hypertension: Secondary | ICD-10-CM

## 2014-12-26 DIAGNOSIS — I2510 Atherosclerotic heart disease of native coronary artery without angina pectoris: Secondary | ICD-10-CM

## 2014-12-26 DIAGNOSIS — N521 Erectile dysfunction due to diseases classified elsewhere: Secondary | ICD-10-CM

## 2014-12-26 DIAGNOSIS — L570 Actinic keratosis: Secondary | ICD-10-CM

## 2014-12-26 DIAGNOSIS — L309 Dermatitis, unspecified: Secondary | ICD-10-CM

## 2014-12-26 MED ORDER — VARDENAFIL HCL 20 MG PO TABS: 60.0000 mg | ORAL_TABLET | Freq: Every day | ORAL | Status: DC | PRN

## 2014-12-26 NOTE — Assessment & Plan Note (Signed)
Pt declined cryo Derm ref offered

## 2014-12-26 NOTE — Assessment & Plan Note (Signed)
Per [facility] - he would like to try Sildenafil 20 mg prn

## 2014-12-26 NOTE — Progress Notes (Signed)
Pre visit review using our clinic review tool, if applicable. No additional management support is needed unless otherwise documented below in the visit note. 

## 2014-12-26 NOTE — Assessment & Plan Note (Signed)
Continue with current prescription therapy as reflected on the Med list.  

## 2014-12-26 NOTE — Assessment & Plan Note (Signed)
No CP 

## 2014-12-26 NOTE — Progress Notes (Signed)
Subjective:    HPI    F/u CHF/SOB - doing well F/u insomnia - chlorazepate caused bad dreams  The patient needs to address  chronic hypertension that has not  been well controlled with medicines; to address chronic  HTN, hyperlipidemia controlled with medicines ; and to address OA, controlled with medical treatment and diet.   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    Mr Croswell lost his dtr and his wife in 2015   BP Readings from Last 3 Encounters:  12/26/14 140/80  11/21/14 120/58  07/01/14 112/74   Wt Readings from Last 3 Encounters:  12/26/14 183 lb (83.008 kg)  11/21/14 180 lb (81.647 kg)  07/01/14 185 lb (83.915 kg)      Review of Systems  Constitutional: Negative for appetite change, fatigue and unexpected weight change.  HENT: Negative for congestion, nosebleeds, sneezing, sore throat, trouble swallowing and voice change.   Eyes: Negative for itching and visual disturbance.  Respiratory: Negative for cough.   Cardiovascular: Negative for chest pain, palpitations and leg swelling.  Gastrointestinal: Negative for nausea, diarrhea, blood in stool and abdominal distention.  Genitourinary: Negative for frequency and hematuria.  Musculoskeletal: Positive for arthralgias. Negative for back pain, joint swelling, gait problem and neck pain.  Skin: Positive for rash.  Neurological: Negative for dizziness, tremors, speech difficulty and weakness.  Psychiatric/Behavioral: Negative for sleep disturbance, dysphoric mood and agitation. The patient is not nervous/anxious.        Objective:   Physical Exam  Constitutional: He is oriented to person, place, and time. He appears well-developed. No distress.  NAD  HENT:  Mouth/Throat: Oropharynx is clear and moist.  Eyes: Conjunctivae are normal. Pupils are equal, round, and reactive to light.  Neck: Normal range of motion. No JVD present.  No thyromegaly present.  Cardiovascular: Normal rate, regular rhythm, normal heart sounds and intact distal pulses.  Exam reveals no gallop and no friction rub.   No murmur heard. Pulmonary/Chest: Effort normal and breath sounds normal. No respiratory distress. He has no wheezes. He has no rales. He exhibits no tenderness.  Abdominal: Soft. Bowel sounds are normal. He exhibits no distension and no mass. There is no tenderness. There is no rebound and no guarding.  Musculoskeletal: Normal range of motion. He exhibits no edema or tenderness.  Lymphadenopathy:    He has no cervical adenopathy.  Neurological: He is alert and oriented to person, place, and time. He has normal reflexes. No cranial nerve deficit. He exhibits normal muscle tone. He displays a negative Romberg sign. Coordination and gait normal.  No meningeal signs  Skin: Skin is warm and dry. No rash noted.  Psychiatric: He has a normal mood and affect. His behavior is normal. Judgment and thought content normal.  Eczema patch on R cheek AK on nose Lower lip is eryth w/angular stomatitis    Lab Results  Component Value Date   WBC 6.2 03/08/2014   HGB 14.9 03/08/2014   HCT 44.4 03/08/2014   PLT 126.0* 03/08/2014   GLUCOSE 84 07/01/2014   CHOL 110 05/25/2013   TRIG 83.0 05/25/2013   HDL 39.00* 05/25/2013   LDLCALC 54 05/25/2013   ALT 23 03/08/2014   AST 27 03/08/2014   NA 135 07/01/2014   K 3.8 07/01/2014   CL 100 07/01/2014   CREATININE 0.8 07/01/2014   BUN 15 07/01/2014   CO2  28 07/01/2014   TSH 1.74 03/08/2014   PSA 0.24 03/08/2014          Assessment & Plan:  Patient ID: Ziyad Dyar, male   DOB: August 01, 1920, 81 y.o.   MRN: [id number]

## 2014-12-26 NOTE — Assessment & Plan Note (Signed)
2/16 R cheek Triamcinolone bid prn

## 2014-12-28 ENCOUNTER — Other Ambulatory Visit: Payer: Self-pay | Admitting: *Deleted

## 2014-12-28 MED ORDER — ALPRAZOLAM 1 MG PO TABS
1.0000 mg | ORAL_TABLET | Freq: Every day | ORAL | Status: DC
Start: 2014-12-28 — End: 2015-02-20

## 2014-12-28 NOTE — Telephone Encounter (Signed)
Med reordered.

## 2015-01-02 ENCOUNTER — Telehealth: Payer: Self-pay | Admitting: Cardiology

## 2015-01-02 NOTE — Telephone Encounter (Signed)
°  1. Which medications need to be refilled? Revatio, 20 mg instead of the other one  2. Which pharmacy is medication to be sent to?[facility] [phone] 3. Do they need a 30 day or 90 day supply? 60  4. Would they like a call back once the medication has been sent to the pharmacy? yes

## 2015-01-02 NOTE — Telephone Encounter (Signed)
OK to refill Revatio 20 mg number 60 po prn.

## 2015-01-02 NOTE — Telephone Encounter (Signed)
Pt needs written prescription for Sildenafil (Viagra) 20 mg #60.

## 2015-01-02 NOTE — Telephone Encounter (Signed)
Pt needs a written prescription for Sildenafil 20 mg #60. Pt would like to pick this up today if possible.

## 2015-01-03 ENCOUNTER — Other Ambulatory Visit: Payer: Self-pay | Admitting: *Deleted

## 2015-01-03 MED ORDER — VARDENAFIL HCL 20 MG PO TABS: 60.0000 mg | ORAL_TABLET | Freq: Every day | ORAL | Status: DC | PRN

## 2015-01-04 MED ORDER — SILDENAFIL CITRATE 20 MG PO TABS: 20.0000 mg | ORAL_TABLET | Freq: Every day | ORAL | Status: DC | PRN

## 2015-01-04 NOTE — Telephone Encounter (Signed)
Pt presented walk-in bc he had not gotten Rx, revatio Rx submitted based on physician OK from last telephone encounter.

## 2015-01-05 ENCOUNTER — Other Ambulatory Visit: Payer: Self-pay | Admitting: *Deleted

## 2015-01-05 MED ORDER — SILDENAFIL CITRATE 20 MG PO TABS: 20.0000 mg | ORAL_TABLET | Freq: Every day | ORAL | Status: DC | PRN

## 2015-01-16 ENCOUNTER — Encounter: Payer: Self-pay | Admitting: Internal Medicine

## 2015-01-16 ENCOUNTER — Ambulatory Visit (INDEPENDENT_AMBULATORY_CARE_PROVIDER_SITE_OTHER): Payer: Medicare Other | Admitting: Internal Medicine

## 2015-01-16 VITALS — BP 150/94 | HR 95 | Temp 97.6°F | Resp 55 | Wt 182.2 lb

## 2015-01-16 DIAGNOSIS — J069 Acute upper respiratory infection, unspecified: Secondary | ICD-10-CM

## 2015-01-16 DIAGNOSIS — L570 Actinic keratosis: Secondary | ICD-10-CM

## 2015-01-16 MED ORDER — PROMETHAZINE-CODEINE 6.25-10 MG/5ML PO SYRP: 5.0000 mL | ORAL_SOLUTION | ORAL | Status: DC | PRN

## 2015-01-16 MED ORDER — AZITHROMYCIN 250 MG PO TABS: ORAL_TABLET | ORAL | Status: DC

## 2015-01-16 NOTE — Assessment & Plan Note (Signed)
A Z pac Prom-cod cough syr

## 2015-01-16 NOTE — Patient Instructions (Signed)
Postprocedure instructions :     Keep the wounds clean. You can wash them with liquid soap and water. Pat dry with gauze or a Kleenex tissue  Before applying antibiotic ointment and a Band-Aid if needed.   You need to report immediately  if  any signs of infection develop.

## 2015-01-16 NOTE — Progress Notes (Signed)
Subjective:    HPI  C/o URI sx's x 2 weeks - worse C/o skin lesions on face, scalp  F/u CHF/SOB - doing well F/u insomnia - chlorazepate caused bad dreams  The patient needs to address  chronic hypertension that has not  been well controlled with medicines; to address chronic  HTN, hyperlipidemia controlled with medicines ; and to address OA, controlled with medical treatment and diet.   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    [name] [name] lost his dtr and his wife in 2015   BP Readings from Last 3 Encounters:  01/16/15 150/94  12/26/14 140/80  11/21/14 120/58   Wt Readings from Last 3 Encounters:  01/16/15 182 lb 4 oz (82.668 kg)  12/26/14 183 lb (83.008 kg)  11/21/14 180 lb (81.647 kg)      Review of Systems  Constitutional: Negative for appetite change, fatigue and unexpected weight change.  HENT: Negative for congestion, nosebleeds, sneezing, sore throat, trouble swallowing and voice change.   Eyes: Negative for itching and visual disturbance.  Respiratory: Negative for cough.   Cardiovascular: Negative for chest pain, palpitations and leg swelling.  Gastrointestinal: Negative for nausea, diarrhea, blood in stool and abdominal distention.  Genitourinary: Negative for frequency and hematuria.  Musculoskeletal: Positive for arthralgias. Negative for back pain, joint swelling, gait problem and neck pain.  Skin: Positive for rash.  Neurological: Negative for dizziness, tremors, speech difficulty and weakness.  Psychiatric/Behavioral: Negative for sleep disturbance, dysphoric mood and agitation. The patient is not nervous/anxious.        Objective:   Physical Exam  Constitutional: He is oriented to person, place, and time. He appears well-developed. No distress.  NAD  HENT:  Mouth/Throat: Oropharynx is clear and moist.  Eyes: Conjunctivae are normal. Pupils are equal, round, and  reactive to light.  Neck: Normal range of motion. No JVD present. No thyromegaly present.  Cardiovascular: Normal rate, regular rhythm, normal heart sounds and intact distal pulses.  Exam reveals no gallop and no friction rub.   No murmur heard. Pulmonary/Chest: Effort normal and breath sounds normal. No respiratory distress. He has no wheezes. He has no rales. He exhibits no tenderness.  Abdominal: Soft. Bowel sounds are normal. He exhibits no distension and no mass. There is no tenderness. There is no rebound and no guarding.  Musculoskeletal: Normal range of motion. He exhibits no edema or tenderness.  Lymphadenopathy:    He has no cervical adenopathy.  Neurological: He is alert and oriented to person, place, and time. He has normal reflexes. No cranial nerve deficit. He exhibits normal muscle tone. He displays a negative Romberg sign. Coordination and gait normal.  No meningeal signs  Skin: Skin is warm and dry. No rash noted.  Psychiatric: He has a normal mood and affect. His behavior is normal. Judgment and thought content normal.  Eczema-like patch on R cheek AK on nose, face, scalp B rhonchi  Lab Results  Component Value Date   WBC 6.2 03/08/2014   HGB 14.9 03/08/2014   HCT 44.4 03/08/2014   PLT 126.0* 03/08/2014   GLUCOSE 84 07/01/2014   CHOL 110 05/25/2013   TRIG 83.0 05/25/2013   HDL 39.00* 05/25/2013   LDLCALC 54 05/25/2013   ALT 23 03/08/2014   AST 27 03/08/2014   NA 135 07/01/2014   K 3.8 07/01/2014   CL 100 07/01/2014  CREATININE 0.8 07/01/2014   BUN 15 07/01/2014   CO2 28 07/01/2014   TSH 1.74 03/08/2014   PSA 0.24 03/08/2014      Procedure Note :     Procedure : Cryosurgery   Indication:  Actinic keratosis(es) x4   Risks including unsuccessful procedure , bleeding, infection, bruising, scar, a need for a repeat  procedure and others were explained to the patient in detail as well as the benefits. Informed consent was obtained verbally.    4 lesion(s)   on scalp and face   was/were treated with liquid nitrogen on a Q-tip in a usual fasion . Band-Aid was applied and antibiotic ointment was given for a later use.   Tolerated well. Complications none.       Assessment & Plan:  Patient ID: [name] [name], male   DOB: 01/11/1920, 81 y.o.   MRN: [id number]

## 2015-01-16 NOTE — Assessment & Plan Note (Signed)
See procedure Appt w/Dr Allyson Sabal

## 2015-01-16 NOTE — Progress Notes (Signed)
Pre visit review using our clinic review tool, if applicable. No additional management support is needed unless otherwise documented below in the visit note. 

## 2015-01-23 ENCOUNTER — Ambulatory Visit (INDEPENDENT_AMBULATORY_CARE_PROVIDER_SITE_OTHER)
Admission: RE | Admit: 2015-01-23 | Discharge: 2015-01-23 | Disposition: A | Discharge status: Home / Self Care | Payer: Medicare Other | Source: Ambulatory Visit | Attending: Internal Medicine | Admitting: Internal Medicine

## 2015-01-23 ENCOUNTER — Encounter: Payer: Self-pay | Admitting: Internal Medicine

## 2015-01-23 ENCOUNTER — Ambulatory Visit (INDEPENDENT_AMBULATORY_CARE_PROVIDER_SITE_OTHER): Payer: Medicare Other | Admitting: Internal Medicine

## 2015-01-23 VITALS — BP 146/76 | HR 67 | Temp 97.8°F | Resp 16 | Wt 181.0 lb

## 2015-01-23 DIAGNOSIS — J069 Acute upper respiratory infection, unspecified: Secondary | ICD-10-CM

## 2015-01-23 MED ORDER — PROMETHAZINE-CODEINE 6.25-10 MG/5ML PO SYRP: 5.0000 mL | ORAL_SOLUTION | ORAL | Status: DC | PRN

## 2015-01-23 MED ORDER — CEFUROXIME AXETIL 250 MG PO TABS: 250.0000 mg | ORAL_TABLET | Freq: Two times a day (BID) | ORAL | Status: DC

## 2015-01-23 NOTE — Progress Notes (Signed)
Subjective:    HPI  F/u URI sx's x 2 weeks - better, still coughing F/u skin lesions on face, scalp  F/u CHF/SOB - doing well F/u insomnia - chlorazepate caused bad dreams  The patient needs to address  chronic hypertension that has not  been well controlled with medicines; to address chronic  HTN, hyperlipidemia controlled with medicines ; and to address OA, controlled with medical treatment and diet.   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    Mr [name] [name] lost his dtr and his wife in 2015   BP Readings from Last 3 Encounters:  01/23/15 146/76  01/16/15 150/94  12/26/14 140/80   Wt Readings from Last 3 Encounters:  01/23/15 181 lb (82.101 kg)  01/16/15 182 lb 4 oz (82.668 kg)  12/26/14 183 lb (83.008 kg)      Review of Systems  Constitutional: Negative for appetite change, fatigue and unexpected weight change.  HENT: Negative for congestion, nosebleeds, sneezing, sore throat, trouble swallowing and voice change.   Eyes: Negative for itching and visual disturbance.  Respiratory: Negative for cough.   Cardiovascular: Negative for chest pain, palpitations and leg swelling.  Gastrointestinal: Negative for nausea, diarrhea, blood in stool and abdominal distention.  Genitourinary: Negative for frequency and hematuria.  Musculoskeletal: Positive for arthralgias. Negative for back pain, joint swelling, gait problem and neck pain.  Skin: Positive for rash.  Neurological: Negative for dizziness, tremors, speech difficulty and weakness.  Psychiatric/Behavioral: Negative for sleep disturbance, dysphoric mood and agitation. The patient is not nervous/anxious.        Objective:   Physical Exam  Constitutional: He is oriented to person, place, and time. He appears well-developed. No distress.  NAD  HENT:  Mouth/Throat: Oropharynx is clear and moist.  Eyes: Conjunctivae are normal. Pupils are  equal, round, and reactive to light.  Neck: Normal range of motion. No JVD present. No thyromegaly present.  Cardiovascular: Normal rate, regular rhythm, normal heart sounds and intact distal pulses.  Exam reveals no gallop and no friction rub.   No murmur heard. Pulmonary/Chest: Effort normal and breath sounds normal. No respiratory distress. He has no wheezes. He has no rales. He exhibits no tenderness.  Abdominal: Soft. Bowel sounds are normal. He exhibits no distension and no mass. There is no tenderness. There is no rebound and no guarding.  Musculoskeletal: Normal range of motion. He exhibits no edema or tenderness.  Lymphadenopathy:    He has no cervical adenopathy.  Neurological: He is alert and oriented to person, place, and time. He has normal reflexes. No cranial nerve deficit. He exhibits normal muscle tone. He displays a negative Romberg sign. Coordination and gait normal.  No meningeal signs  Skin: Skin is warm and dry. No rash noted.  Psychiatric: He has a normal mood and affect. His behavior is normal. Judgment and thought content normal.  Eczema-like patch on R cheek - better AK on nose, face, scalp R nare w/a cold sore  Lab Results  Component Value Date   WBC 6.2 03/08/2014   HGB 14.9 03/08/2014   HCT 44.4 03/08/2014   PLT 126.0* 03/08/2014   GLUCOSE 84 07/01/2014   CHOL 110 05/25/2013   TRIG 83.0 05/25/2013   HDL 39.00* 05/25/2013   LDLCALC 54 05/25/2013   ALT 23 03/08/2014   AST 27 03/08/2014   NA 135 07/01/2014   K 3.8 07/01/2014  CL 100 07/01/2014   CREATININE 0.8 07/01/2014   BUN 15 07/01/2014   CO2 28 07/01/2014   TSH 1.74 03/08/2014   PSA 0.24 03/08/2014           Assessment & Plan:  Patient ID: [name] [name], male   DOB: Jun 17, 1920, 81 y.o.   MRN: [id number]

## 2015-01-23 NOTE — Assessment & Plan Note (Signed)
Not much better CXR Ceftin Rx x10 d

## 2015-01-23 NOTE — Progress Notes (Signed)
Pre visit review using our clinic review tool, if applicable. No additional management support is needed unless otherwise documented below in the visit note. 

## 2015-01-25 ENCOUNTER — Other Ambulatory Visit: Payer: Self-pay | Admitting: Cardiology

## 2015-02-20 ENCOUNTER — Ambulatory Visit (INDEPENDENT_AMBULATORY_CARE_PROVIDER_SITE_OTHER): Payer: Medicare Other | Admitting: Internal Medicine

## 2015-02-20 ENCOUNTER — Encounter: Payer: Self-pay | Admitting: Internal Medicine

## 2015-02-20 ENCOUNTER — Other Ambulatory Visit (INDEPENDENT_AMBULATORY_CARE_PROVIDER_SITE_OTHER): Payer: Medicare Other

## 2015-02-20 VITALS — BP 120/70 | HR 64 | Temp 97.8°F | Resp 16 | Ht 67.0 in | Wt 180.0 lb

## 2015-02-20 DIAGNOSIS — E785 Hyperlipidemia, unspecified: Secondary | ICD-10-CM

## 2015-02-20 DIAGNOSIS — Z0000 Encounter for general adult medical examination without abnormal findings: Secondary | ICD-10-CM

## 2015-02-20 DIAGNOSIS — R5383 Other fatigue: Secondary | ICD-10-CM | POA: Diagnosis not present

## 2015-02-20 LAB — HEPATIC FUNCTION PANEL
ALK PHOS: 122 U/L — AB (ref 39–117)
ALT: 17 U/L (ref 0–53)
AST: 20 U/L (ref 0–37)
Albumin: 3.8 g/dL (ref 3.5–5.2)
BILIRUBIN DIRECT: 0.3 mg/dL (ref 0.0–0.3)
BILIRUBIN TOTAL: 1.4 mg/dL — AB (ref 0.2–1.2)
TOTAL PROTEIN: 6.9 g/dL (ref 6.0–8.3)

## 2015-02-20 LAB — BASIC METABOLIC PANEL
BUN: 36 mg/dL — ABNORMAL HIGH (ref 6–23)
CHLORIDE: 105 meq/L (ref 96–112)
CO2: 27 mEq/L (ref 19–32)
Calcium: 9.1 mg/dL (ref 8.4–10.5)
Creatinine, Ser: 1.49 mg/dL (ref 0.40–1.50)
GFR: 46.77 mL/min — ABNORMAL LOW (ref >60.00)
Glucose, Bld: 78 mg/dL (ref 70–99)
POTASSIUM: 4.9 meq/L (ref 3.5–5.1)
SODIUM: 138 meq/L (ref 135–145)

## 2015-02-20 LAB — TSH: TSH: 2.06 u[IU]/mL (ref 0.35–4.50)

## 2015-02-20 MED ORDER — ALPRAZOLAM 1 MG PO TABS: 1.0000 mg | ORAL_TABLET | Freq: Every day | ORAL | Status: DC

## 2015-02-20 MED ORDER — FUROSEMIDE 20 MG PO TABS: 20.0000 mg | ORAL_TABLET | Freq: Every day | ORAL | Status: DC

## 2015-02-20 MED ORDER — SILDENAFIL CITRATE 20 MG PO TABS: 20.0000 mg | ORAL_TABLET | Freq: Every day | ORAL | Status: DC | PRN

## 2015-02-20 NOTE — Assessment & Plan Note (Signed)
Here for medicare wellness/physical  Diet: heart healthy  Physical activity: not sedentary in warm weather Depression/mood screen: negative  Hearing: intact to whispered voice  Visual acuity: grossly normal, performs annual eye exam  ADLs: capable  Fall risk: low Home safety: good  Cognitive evaluation: intact to orientation, naming, recall and repetition  EOL planning: adv directives, full code/ I agree  I have personally reviewed and have noted  1. The patient's medical and social history  2. Their use of alcohol, tobacco or illicit drugs  3. Their current medications and supplements  4. The patient's functional ability including ADL's, fall risks, home safety risks and hearing or visual impairment.  5. Diet and physical activities  6. Evidence for depression or mood disorders    Today patient counseled on age appropriate routine health concerns for screening and prevention, each reviewed and up to date or declined. Immunizations reviewed and up to date or declined. Labs ordered and reviewed. Risk factors for depression reviewed and negative. Hearing function and visual acuity are intact. ADLs screened and addressed as needed. Functional ability and level of safety reviewed and appropriate. Education, counseling and referrals performed based on assessed risks today. Patient provided with a copy of personalized plan for preventive services.

## 2015-02-20 NOTE — Progress Notes (Signed)
Pre visit review using our clinic review tool, if applicable. No additional management support is needed unless otherwise documented below in the visit note. 

## 2015-02-20 NOTE — Patient Instructions (Signed)

## 2015-02-20 NOTE — Progress Notes (Signed)
Subjective:    HPI  The patient is here for a wellness exam.   F/u CHF/SOB - doing well. He started a garden. C/o ED  The patient needs to address  chronic hypertension that has not  been well controlled with medicines; to address chronic  HTN, hyperlipidemia controlled with medicines ; and to address OA, controlled with medical treatment and diet.   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    [name] [name] lost his dtr and his wife in 2015   BP Readings from Last 3 Encounters:  02/20/15 120/70  01/23/15 146/76  01/16/15 150/94   Wt Readings from Last 3 Encounters:  02/20/15 180 lb (81.647 kg)  01/23/15 181 lb (82.101 kg)  01/16/15 182 lb 4 oz (82.668 kg)      Review of Systems  Constitutional: Negative for appetite change, fatigue and unexpected weight change.  HENT: Negative for congestion, nosebleeds, sneezing, sore throat, trouble swallowing and voice change.   Eyes: Negative for itching and visual disturbance.  Respiratory: Negative for cough.   Cardiovascular: Negative for chest pain, palpitations and leg swelling.  Gastrointestinal: Negative for nausea, diarrhea, blood in stool and abdominal distention.  Genitourinary: Negative for frequency and hematuria.  Musculoskeletal: Positive for arthralgias. Negative for back pain, joint swelling, gait problem and neck pain.  Skin: Positive for rash.  Neurological: Negative for dizziness, tremors, speech difficulty and weakness.  Psychiatric/Behavioral: Negative for sleep disturbance, dysphoric mood and agitation. The patient is not nervous/anxious.        Objective:   Physical Exam  Constitutional: He is oriented to person, place, and time. He appears well-developed. No distress.  NAD  HENT:  Mouth/Throat: Oropharynx is clear and moist.  Eyes: Conjunctivae are normal. Pupils are equal, round, and reactive to light.  Neck: Normal range  of motion. No JVD present. No thyromegaly present.  Cardiovascular: Normal rate, regular rhythm, normal heart sounds and intact distal pulses.  Exam reveals no gallop and no friction rub.   No murmur heard. Pulmonary/Chest: Effort normal and breath sounds normal. No respiratory distress. He has no wheezes. He has no rales. He exhibits no tenderness.  Abdominal: Soft. Bowel sounds are normal. He exhibits no distension and no mass. There is no tenderness. There is no rebound and no guarding.  Musculoskeletal: Normal range of motion. He exhibits no edema or tenderness.  Lymphadenopathy:    He has no cervical adenopathy.  Neurological: He is alert and oriented to person, place, and time. He has normal reflexes. No cranial nerve deficit. He exhibits normal muscle tone. He displays a negative Romberg sign. Coordination and gait normal.  No meningeal signs  Skin: Skin is warm and dry. No rash noted.  Psychiatric: He has a normal mood and affect. His behavior is normal. Judgment and thought content normal.  Eczema patch on R cheek - he saw Dr Allyson Sabal [place] on nose - treated Lower lip is eryth w/angular stomatitis - resolved    Lab Results  Component Value Date   WBC 6.2 03/08/2014   HGB 14.9 03/08/2014   HCT 44.4 03/08/2014   PLT 126.0* 03/08/2014   GLUCOSE 84 07/01/2014   CHOL 110 05/25/2013   TRIG 83.0 05/25/2013   HDL 39.00* 05/25/2013   LDLCALC 54 05/25/2013   ALT 23 03/08/2014   AST 27 03/08/2014   NA 135 07/01/2014   K 3.8 07/01/2014  CL 100 07/01/2014   CREATININE 0.8 07/01/2014   BUN 15 07/01/2014   CO2 28 07/01/2014   TSH 1.74 03/08/2014   PSA 0.24 03/08/2014          Assessment & Plan:

## 2015-02-21 ENCOUNTER — Other Ambulatory Visit: Payer: Self-pay | Admitting: Internal Medicine

## 2015-03-21 ENCOUNTER — Other Ambulatory Visit: Payer: Self-pay | Admitting: Cardiology

## 2015-05-08 ENCOUNTER — Telehealth: Payer: Self-pay | Admitting: *Deleted

## 2015-05-08 NOTE — Telephone Encounter (Signed)
Noted. [employer] will likely to ask for clearance from his eye doctor. Thx

## 2015-05-08 NOTE — Telephone Encounter (Signed)
Pt came in to office today requesting a letter stating he is in good health with well controlled medical problems and can still drive. Apparently, [employer] feels that he should give up his driving privelages. Please advise.

## 2015-05-11 NOTE — Telephone Encounter (Signed)
Left detailed mess informing pt letter is upfront for p/u.

## 2015-05-11 NOTE — Telephone Encounter (Signed)
Patient stopped back in to check to see if Dr. Alain Marion will write a letter. He did say that his eye doctor did send the form in for clearance.

## 2015-05-11 NOTE — Telephone Encounter (Signed)
Letter printed/signed/ready for p/u.

## 2015-05-15 ENCOUNTER — Other Ambulatory Visit: Payer: Self-pay | Admitting: Cardiology

## 2015-05-23 ENCOUNTER — Encounter: Payer: Self-pay | Admitting: Internal Medicine

## 2015-05-23 ENCOUNTER — Encounter (INDEPENDENT_AMBULATORY_CARE_PROVIDER_SITE_OTHER): Payer: Self-pay

## 2015-05-23 ENCOUNTER — Telehealth: Payer: Self-pay | Admitting: Internal Medicine

## 2015-05-23 ENCOUNTER — Other Ambulatory Visit (INDEPENDENT_AMBULATORY_CARE_PROVIDER_SITE_OTHER): Payer: Medicare Other

## 2015-05-23 ENCOUNTER — Ambulatory Visit (INDEPENDENT_AMBULATORY_CARE_PROVIDER_SITE_OTHER): Payer: Medicare Other | Admitting: Internal Medicine

## 2015-05-23 ENCOUNTER — Ambulatory Visit: Payer: Medicare Other | Admitting: Internal Medicine

## 2015-05-23 VITALS — BP 124/74 | HR 50 | Wt 177.0 lb

## 2015-05-23 DIAGNOSIS — I10 Essential (primary) hypertension: Secondary | ICD-10-CM | POA: Diagnosis not present

## 2015-05-23 DIAGNOSIS — M150 Primary generalized (osteo)arthritis: Secondary | ICD-10-CM

## 2015-05-23 DIAGNOSIS — E785 Hyperlipidemia, unspecified: Secondary | ICD-10-CM

## 2015-05-23 DIAGNOSIS — I2510 Atherosclerotic heart disease of native coronary artery without angina pectoris: Secondary | ICD-10-CM

## 2015-05-23 DIAGNOSIS — M8949 Other hypertrophic osteoarthropathy, multiple sites: Secondary | ICD-10-CM

## 2015-05-23 DIAGNOSIS — M159 Polyosteoarthritis, unspecified: Secondary | ICD-10-CM

## 2015-05-23 LAB — HEPATIC FUNCTION PANEL
ALK PHOS: 105 U/L (ref 39–117)
ALT: 13 U/L (ref 0–53)
AST: 18 U/L (ref 0–37)
Albumin: 3.9 g/dL (ref 3.5–5.2)
Bilirubin, Direct: 0.3 mg/dL (ref 0.0–0.3)
TOTAL PROTEIN: 7.1 g/dL (ref 6.0–8.3)
Total Bilirubin: 1.6 mg/dL — ABNORMAL HIGH (ref 0.2–1.2)

## 2015-05-23 LAB — BASIC METABOLIC PANEL
BUN: 41 mg/dL — ABNORMAL HIGH (ref 6–23)
CALCIUM: 9.2 mg/dL (ref 8.4–10.5)
CO2: 26 meq/L (ref 19–32)
Chloride: 106 mEq/L (ref 96–112)
Creatinine, Ser: 1.78 mg/dL — ABNORMAL HIGH (ref 0.40–1.50)
GFR: 38.07 mL/min — AB (ref >60.00)
GLUCOSE: 91 mg/dL (ref 70–99)
POTASSIUM: 4.6 meq/L (ref 3.5–5.1)
SODIUM: 143 meq/L (ref 135–145)

## 2015-05-23 LAB — TSH: TSH: 2.42 u[IU]/mL (ref 0.35–4.50)

## 2015-05-23 MED ORDER — FUROSEMIDE 20 MG PO TABS: 20.0000 mg | ORAL_TABLET | Freq: Every day | ORAL | Status: DC

## 2015-05-23 NOTE — Assessment & Plan Note (Signed)
On Simvastatin 

## 2015-05-23 NOTE — Progress Notes (Addendum)
Subjective:  Patient ID: [name] [name], male    DOB: Mar 30, 1920  Age: 81 y.o. MRN: [id number]  CC: No chief complaint on file.   HPI [name] [name] presents for a f/u of OA, HTN, dyslipidemia  Outpatient Prescriptions Prior to Visit  Medication Sig Dispense Refill  . ALPRAZolam (XANAX) 1 MG tablet Take 1 tablet (1 mg total) by mouth at bedtime. 60 tablet 3  . aspirin EC 81 MG tablet Take 81 mg by mouth daily.    . clotrimazole-betamethasone (LOTRISONE) cream Apply 1 application topically 2 (two) times daily. 15 g 1  . diclofenac (VOLTAREN) 75 MG EC tablet TAKE (1) TABLET TWICE A DAY WITH FOOD. 60 tablet 3  . furosemide (LASIX) 20 MG tablet TAKE 1 TABLET DAILY. 30 tablet 11  . gabapentin (NEURONTIN) 100 MG capsule Take 1 capsule (100 mg total) by mouth 3 (three) times daily as needed (neuropathy). 90 capsule 5  . losartan-hydrochlorothiazide (HYZAAR) 50-12.5 MG per tablet Take 1 tablet by mouth daily. 90 tablet 3  . metoprolol succinate (TOPROL-XL) 50 MG 24 hr tablet Take 1 tablet (50 mg total) by mouth daily. 30 tablet 0  . promethazine-codeine (PHENERGAN WITH CODEINE) 6.25-10 MG/5ML syrup Take 5 mLs by mouth every 4 (four) hours as needed. 300 mL 0  . sildenafil (REVATIO) 20 MG tablet Take 1-3 tablets (20-60 mg total) by mouth daily as needed. 60 tablet 3  . simvastatin (ZOCOR) 40 MG tablet TAKE 1/2 BY MOUTH AT BEDTIME 45 tablet 5  . traMADol (ULTRAM) 50 MG tablet Take 1 tablet (50 mg total) by mouth every 6 (six) hours as needed for severe pain. 60 tablet 0  . triamcinolone cream (KENALOG) 0.5 % Apply 1 application topically 4 (four) times daily. 45 g 1  . metoprolol succinate (TOPROL-XL) 50 MG 24 hr tablet TAKE 1 BY MOUTH DAILY 90 tablet 0   Facility-Administered Medications Prior to Visit  Medication Dose Route Frequency Provider Last Rate Last Dose  . methylPREDNISolone acetate (DEPO-MEDROL) injection 40 mg  40 mg Intra-articular Once Aleksei Plotnikov V, MD        ROS Review of  Systems  Constitutional: Negative for appetite change, fatigue and unexpected weight change.  HENT: Negative for congestion, nosebleeds, sneezing, sore throat and trouble swallowing.   Eyes: Negative for itching and visual disturbance.  Respiratory: Negative for cough.   Cardiovascular: Negative for chest pain, palpitations and leg swelling.  Gastrointestinal: Negative for nausea, diarrhea, blood in stool and abdominal distention.  Genitourinary: Negative for frequency and hematuria.  Musculoskeletal: Positive for back pain, arthralgias and gait problem. Negative for joint swelling and neck pain.  Skin: Negative for rash.  Neurological: Negative for dizziness, tremors, speech difficulty and weakness.  Psychiatric/Behavioral: Positive for behavioral problems and sleep disturbance. Negative for suicidal ideas, dysphoric mood and agitation. The patient is not nervous/anxious.     Objective:  BP 124/74 mmHg  Pulse 50  Wt 177 lb (80.287 kg)  SpO2 97%  BP Readings from Last 3 Encounters:  05/23/15 124/74  02/20/15 120/70  01/23/15 146/76    Wt Readings from Last 3 Encounters:  05/23/15 177 lb (80.287 kg)  02/20/15 180 lb (81.647 kg)  01/23/15 181 lb (82.101 kg)    Physical Exam  Constitutional: He is oriented to person, place, and time. He appears well-developed. No distress.  NAD  HENT:  Mouth/Throat: Oropharynx is clear and moist.  Eyes: Conjunctivae are normal. Pupils are equal, round, and reactive to light.  Neck: Normal range  of motion. No JVD present. No thyromegaly present.  Cardiovascular: Normal rate, regular rhythm, normal heart sounds and intact distal pulses.  Exam reveals no gallop and no friction rub.   No murmur heard. Pulmonary/Chest: Effort normal and breath sounds normal. No respiratory distress. He has no wheezes. He has no rales. He exhibits no tenderness.  Abdominal: Soft. Bowel sounds are normal. He exhibits no distension and no mass. There is no tenderness.  There is no rebound and no guarding.  Musculoskeletal: Normal range of motion. He exhibits no edema or tenderness.  Lymphadenopathy:    He has no cervical adenopathy.  Neurological: He is alert and oriented to person, place, and time. He has normal reflexes. No cranial nerve deficit. He exhibits normal muscle tone. He displays a negative Romberg sign. Coordination abnormal. Gait normal.  Skin: Skin is warm and dry. No rash noted.  Psychiatric: He has a normal mood and affect. His behavior is normal. Judgment and thought content normal.  OA changes  Lab Results  Component Value Date   WBC 6.2 03/08/2014   HGB 14.9 03/08/2014   HCT 44.4 03/08/2014   PLT 126.0* 03/08/2014   GLUCOSE 78 02/20/2015   CHOL 110 05/25/2013   TRIG 83.0 05/25/2013   HDL 39.00* 05/25/2013   LDLCALC 54 05/25/2013   ALT 17 02/20/2015   AST 20 02/20/2015   NA 138 02/20/2015   K 4.9 02/20/2015   CL 105 02/20/2015   CREATININE 1.49 02/20/2015   BUN 36* 02/20/2015   CO2 27 02/20/2015   TSH 2.06 02/20/2015   PSA 0.24 03/08/2014    Dg Chest 2 View  01/23/2015   CLINICAL DATA:  81 year old male with cough and congestion for 1 month. Initial encounter.  EXAM: CHEST  2 VIEW  COMPARISON:  03/22/2014 and earlier.  FINDINGS: Stable lung volumes. Stable cardiomegaly and mediastinal contours. No pneumothorax or pulmonary edema. No pleural effusion or consolidation. Nipple shadows re - identified. No areas of acute pulmonary opacity. Chronically advanced degenerative changes at the left shoulder. No acute osseous abnormality identified.  IMPRESSION: No acute cardiopulmonary abnormality.   Electronically Signed   By: Genevie Ann M.D.   On: 01/23/2015 12:57    Assessment & Plan:   There are no diagnoses linked to this encounter. I am having [name] [name] maintain his aspirin EC, gabapentin, traMADol, metoprolol succinate, triamcinolone cream, losartan-hydrochlorothiazide, clotrimazole-betamethasone, diclofenac, promethazine-codeine,  sildenafil, ALPRAZolam, furosemide, and simvastatin. We will continue to administer methylPREDNISolone acetate.  No orders of the defined types were placed in this encounter.     Follow-up: No Follow-up on file.  Walker Kehr, MD

## 2015-05-23 NOTE — Assessment & Plan Note (Signed)
OK to drive if eye check was ok

## 2015-05-23 NOTE — Progress Notes (Signed)
Pre visit review using our clinic review tool, if applicable. No additional management support is needed unless otherwise documented below in the visit note. 

## 2015-05-23 NOTE — Telephone Encounter (Signed)
[name] [name], please, inform patient that his kidney test is worse: he is dehydrated - do not work in the heat and drink more liquids. (I enetered a "normal labs note" in error) Thx

## 2015-05-23 NOTE — Assessment & Plan Note (Signed)
Chronic  On Toprol, Furosemide

## 2015-05-23 NOTE — Assessment & Plan Note (Signed)
No angina On Toprol, ASA

## 2015-05-24 NOTE — Telephone Encounter (Signed)
Notes on lab work was changed to say that patient is dehydrated---lab results mailed

## 2015-05-31 ENCOUNTER — Encounter: Payer: Self-pay | Admitting: Cardiology

## 2015-05-31 ENCOUNTER — Ambulatory Visit (INDEPENDENT_AMBULATORY_CARE_PROVIDER_SITE_OTHER): Payer: Medicare Other | Admitting: Cardiology

## 2015-05-31 VITALS — BP 126/76 | HR 60 | Ht 68.0 in | Wt 178.0 lb

## 2015-05-31 DIAGNOSIS — I10 Essential (primary) hypertension: Secondary | ICD-10-CM | POA: Diagnosis not present

## 2015-05-31 DIAGNOSIS — I2510 Atherosclerotic heart disease of native coronary artery without angina pectoris: Secondary | ICD-10-CM | POA: Diagnosis not present

## 2015-05-31 NOTE — Patient Instructions (Signed)
Your physician wants you to follow-up in: 1 Year. You will receive a reminder letter in the mail two months in advance. If you don't receive a letter, please call our office to schedule the follow-up appointment.  

## 2015-05-31 NOTE — Progress Notes (Signed)
HPI  The patient presents for yearly followup of his mildly reduced ejection fraction.  He has presumed coronary disease with ischemia on a stress perfusion study in 2008. We have managed this medically.   Since I last saw him both his wife and daughter died in 2023/10/16. They have had chronic illnesses he had been caring for them. He's doing relatively well with this. He does his car. He had another fall because he lost his footing. He rarely has some dizziness. He went for He however, does not have presyncope or syncope. He denies any chest pressure, neck or arm discomfort. He's had no weight gain or edema. He has no new shortness of breath, PND or orthopnea. He actually is now dating a woman 60 years ago.   Allergies  Allergen Reactions  . Lisinopril     cough    Prior to Admission medications   Medication Sig Start Date End Date Taking? Authorizing Provider  ALPRAZolam Duanne Moron) 1 MG tablet Take 1 tablet (1 mg total) by mouth at bedtime. 02/20/15  Yes Aleksei Plotnikov V, MD  aspirin EC 81 MG tablet Take 81 mg by mouth daily.   Yes Historical Provider, MD  clotrimazole-betamethasone (LOTRISONE) cream Apply 1 application topically 2 (two) times daily. 11/21/14  Yes Aleksei Plotnikov V, MD  diclofenac (VOLTAREN) 75 MG EC tablet TAKE (1) TABLET TWICE A DAY WITH FOOD. 12/14/14  Yes Aleksei Plotnikov V, MD  furosemide (LASIX) 20 MG tablet Take 1 tablet (20 mg total) by mouth daily. 05/23/15  Yes Aleksei Plotnikov V, MD  gabapentin (NEURONTIN) 100 MG capsule Take 1 capsule (100 mg total) by mouth 3 (three) times daily as needed (neuropathy). 03/29/13  Yes Aleksei Plotnikov V, MD  losartan-hydrochlorothiazide (HYZAAR) 50-12.5 MG per tablet Take 1 tablet by mouth daily. 07/11/14  Yes Minus Breeding, MD  metoprolol succinate (TOPROL-XL) 50 MG 24 hr tablet Take 1 tablet (50 mg total) by mouth daily. 05/16/14  Yes Minus Breeding, MD  promethazine-codeine (PHENERGAN WITH CODEINE) 6.25-10 MG/5ML syrup Take 5 mLs  by mouth every 4 (four) hours as needed. 01/23/15  Yes Aleksei Plotnikov V, MD  sildenafil (REVATIO) 20 MG tablet Take 1-3 tablets (20-60 mg total) by mouth daily as needed. 02/20/15  Yes Aleksei Plotnikov V, MD  simvastatin (ZOCOR) 40 MG tablet TAKE 1/2 BY MOUTH AT BEDTIME 05/15/15  Yes Minus Breeding, MD  traMADol (ULTRAM) 50 MG tablet Take 1 tablet (50 mg total) by mouth every 6 (six) hours as needed for severe pain. 12/21/13  Yes Aleksei Plotnikov V, MD  triamcinolone cream (KENALOG) 0.5 % Apply 1 application topically 4 (four) times daily. 07/01/14  Yes Cassandria Anger, MD     Past Medical History  Diagnosis Date  . Hypertension   . Hyperlipidemia   . Coronary artery disease   . Cardiomyopathy   . Hemorrhoids     Past Surgical History  Procedure Laterality Date  . Tonsilectomy, adenoidectomy, bilateral myringotomy and tubes    . Colonic polyps    . Cholecystectomy    . Inguinal hernia repair      ROS:  As stated in the HPI and negative for all other systems.  PHYSICAL EXAM BP 126/76 mmHg  Pulse 60  Ht 5\' 8"  (1.727 m)  Wt 178 lb (80.74 kg)  BMI 27.07 kg/m2 GENERAL:  Well appearing HEENT:  Right lens opacification, fundi not visualized, oral mucosa unremarkable NECK:  No jugular venous distention, waveform within normal limits, carotid upstroke brisk and  symmetric, no bruits, no thyromegaly LUNGS:  Clear to auscultation bilaterally HEART:  PMI not displaced or sustained,S1 and S2 within normal limits, no S3, no S4, no clicks, no rubs, no murmurs ABD:  Flat, positive bowel sounds normal in frequency in pitch, no bruits, no rebound, no guarding, no midline pulsatile mass, no hepatomegaly, no splenomegaly EXT:  2 plus pulses throughout, no edema, no cyanosis no clubbing SKIN:  No rashes no nodules, multiple bruises   EKG:  Bradycardia rate 60, right bundle branch block, no acute ST-T wave changes. PACs.  05/31/2015   ASSESSMENT AND PLAN  CAD:  The patient has no new  sypmtoms.  No further cardiovascular testing is indicated.  We will continue with aggressive risk reduction and meds as listed.  HTN:   The blood pressure is at target. No change in medications is indicated. We will continue with therapeutic lifestyle changes (TLC).  DEHYDRATION:  The patient was recently dehydrated after falling in the sun for a while. We talked about his diuretic and how he might avoid taking he's going to be out in the sun and sweating quite a bit. I discussed with him the mechanism of action of furosemide.

## 2015-06-05 ENCOUNTER — Other Ambulatory Visit: Payer: Self-pay | Admitting: Cardiology

## 2015-06-05 NOTE — Telephone Encounter (Signed)
Rx has been sent to the pharmacy electronically. ° °

## 2015-06-07 ENCOUNTER — Other Ambulatory Visit: Payer: Self-pay | Admitting: Cardiology

## 2015-06-08 ENCOUNTER — Other Ambulatory Visit: Payer: Self-pay | Admitting: *Deleted

## 2015-06-08 MED ORDER — METOPROLOL SUCCINATE ER 50 MG PO TB24: 50.0000 mg | ORAL_TABLET | Freq: Every day | ORAL | Status: DC

## 2015-06-30 ENCOUNTER — Other Ambulatory Visit: Payer: Self-pay | Admitting: Cardiology

## 2015-06-30 NOTE — Telephone Encounter (Signed)
REFILL 

## 2015-07-18 ENCOUNTER — Other Ambulatory Visit: Payer: Self-pay | Admitting: Internal Medicine

## 2015-08-22 ENCOUNTER — Encounter: Payer: Self-pay | Admitting: Internal Medicine

## 2015-08-22 ENCOUNTER — Telehealth: Payer: Self-pay

## 2015-08-22 ENCOUNTER — Other Ambulatory Visit (INDEPENDENT_AMBULATORY_CARE_PROVIDER_SITE_OTHER): Payer: Medicare Other

## 2015-08-22 ENCOUNTER — Ambulatory Visit (INDEPENDENT_AMBULATORY_CARE_PROVIDER_SITE_OTHER): Payer: Medicare Other | Admitting: Internal Medicine

## 2015-08-22 VITALS — BP 140/80 | HR 46 | Wt 185.0 lb

## 2015-08-22 DIAGNOSIS — R0609 Other forms of dyspnea: Secondary | ICD-10-CM

## 2015-08-22 DIAGNOSIS — I10 Essential (primary) hypertension: Secondary | ICD-10-CM | POA: Diagnosis not present

## 2015-08-22 DIAGNOSIS — F341 Dysthymic disorder: Secondary | ICD-10-CM

## 2015-08-22 DIAGNOSIS — I2583 Coronary atherosclerosis due to lipid rich plaque: Secondary | ICD-10-CM

## 2015-08-22 DIAGNOSIS — Z23 Encounter for immunization: Secondary | ICD-10-CM

## 2015-08-22 DIAGNOSIS — R0600 Dyspnea, unspecified: Secondary | ICD-10-CM

## 2015-08-22 DIAGNOSIS — R899 Unspecified abnormal finding in specimens from other organs, systems and tissues: Secondary | ICD-10-CM

## 2015-08-22 DIAGNOSIS — I2510 Atherosclerotic heart disease of native coronary artery without angina pectoris: Secondary | ICD-10-CM | POA: Diagnosis not present

## 2015-08-22 LAB — BASIC METABOLIC PANEL
BUN: 43 mg/dL — ABNORMAL HIGH (ref 6–23)
CALCIUM: 8.7 mg/dL (ref 8.4–10.5)
CO2: 23 meq/L (ref 19–32)
CREATININE: 1.74 mg/dL — AB (ref 0.40–1.50)
Chloride: 107 mEq/L (ref 96–112)
GFR: 39.06 mL/min — AB (ref >60.00)
GLUCOSE: 92 mg/dL (ref 70–99)
Potassium: 7.4 mEq/L (ref 3.5–5.1)
SODIUM: 136 meq/L (ref 135–145)

## 2015-08-22 MED ORDER — ALPRAZOLAM 1 MG PO TABS: 1.0000 mg | ORAL_TABLET | Freq: Every day | ORAL | Status: DC

## 2015-08-22 MED ORDER — DICLOFENAC SODIUM 75 MG PO TBEC: 75.0000 mg | DELAYED_RELEASE_TABLET | Freq: Two times a day (BID) | ORAL | Status: DC

## 2015-08-22 MED ORDER — GABAPENTIN 100 MG PO CAPS: 100.0000 mg | ORAL_CAPSULE | Freq: Three times a day (TID) | ORAL | Status: DC | PRN

## 2015-08-22 NOTE — Telephone Encounter (Signed)
Tried to contact pt but number was disconnected. Sent letter indicating (per PCP) to go back to the lab for repeat potassium check.  Lab entered.

## 2015-08-22 NOTE — Assessment & Plan Note (Signed)
No angina On Toprol, ASA 

## 2015-08-22 NOTE — Assessment & Plan Note (Signed)
Better with T drop outside

## 2015-08-22 NOTE — Assessment & Plan Note (Signed)
On Toprol, Furosemide 

## 2015-08-22 NOTE — Assessment & Plan Note (Signed)
Chronic - not on meds  Potential benefits of a long term benzodiazepines  use as well as potential risks  and complications were explained to the patient and were aknowledged.

## 2015-08-22 NOTE — Progress Notes (Signed)
Pre visit review using our clinic review tool, if applicable. No additional management support is needed unless otherwise documented below in the visit note. 

## 2015-08-22 NOTE — Telephone Encounter (Signed)
[name] [name] from the lab called to report a high potassium but the sample that was collected was hemolized. Potassium came back at 7.0

## 2015-08-22 NOTE — Progress Notes (Signed)
Subjective:  Patient ID: Danell Verno, male    DOB: June 13, 1920  Age: 81 y.o. MRN: [id number]  CC: No chief complaint on file.   HPI [facility] Trickey presents for CHF, CRF, OA f/u  Outpatient Prescriptions Prior to Visit  Medication Sig Dispense Refill  . ALPRAZolam (XANAX) 1 MG tablet Take 1 tablet (1 mg total) by mouth at bedtime. 60 tablet 3  . aspirin EC 81 MG tablet Take 81 mg by mouth daily.    . clotrimazole-betamethasone (LOTRISONE) cream Apply 1 application topically 2 (two) times daily. 15 g 1  . diclofenac (VOLTAREN) 75 MG EC tablet TAKE (1) TABLET TWICE A DAY WITH FOOD. 60 tablet 3  . furosemide (LASIX) 20 MG tablet Take 1 tablet (20 mg total) by mouth daily. 90 tablet 3  . losartan-hydrochlorothiazide (HYZAAR) 50-12.5 MG per tablet TAKE 1 BY MOUTH DAILY 90 tablet 3  . metoprolol succinate (TOPROL-XL) 50 MG 24 hr tablet Take 1 tablet (50 mg total) by mouth daily. 30 tablet 1  . promethazine-codeine (PHENERGAN WITH CODEINE) 6.25-10 MG/5ML syrup Take 5 mLs by mouth every 4 (four) hours as needed. 300 mL 0  . sildenafil (REVATIO) 20 MG tablet Take 1-3 tablets (20-60 mg total) by mouth daily as needed. 60 tablet 3  . simvastatin (ZOCOR) 40 MG tablet TAKE 1/2 BY MOUTH AT BEDTIME 45 tablet 5  . triamcinolone cream (KENALOG) 0.5 % Apply 1 application topically 4 (four) times daily. 45 g 1  . metoprolol succinate (TOPROL-XL) 50 MG 24 hr tablet TAKE 1 BY MOUTH DAILY 90 tablet 3  . gabapentin (NEURONTIN) 100 MG capsule Take 1 capsule (100 mg total) by mouth 3 (three) times daily as needed (neuropathy). (Patient not taking: Reported on 08/22/2015) 90 capsule 5  . traMADol (ULTRAM) 50 MG tablet Take 1 tablet (50 mg total) by mouth every 6 (six) hours as needed for severe pain. (Patient not taking: Reported on 08/22/2015) 60 tablet 0   Facility-Administered Medications Prior to Visit  Medication Dose Route Frequency Provider Last Rate Last Dose  . methylPREDNISolone acetate (DEPO-MEDROL)  injection 40 mg  40 mg Intra-articular Once Aleksei Plotnikov V, MD        ROS Review of Systems  Constitutional: Positive for fatigue. Negative for appetite change and unexpected weight change.  HENT: Negative for congestion, nosebleeds, sneezing, sore throat and trouble swallowing.   Eyes: Negative for itching and visual disturbance.  Respiratory: Negative for cough.   Cardiovascular: Negative for chest pain, palpitations and leg swelling.  Gastrointestinal: Negative for nausea, diarrhea, blood in stool and abdominal distention.  Genitourinary: Negative for frequency and hematuria.  Musculoskeletal: Positive for arthralgias and gait problem. Negative for back pain, joint swelling and neck pain.  Skin: Negative for rash.  Neurological: Negative for dizziness, tremors, speech difficulty and weakness.  Psychiatric/Behavioral: Negative for sleep disturbance, dysphoric mood and agitation. The patient is not nervous/anxious.     Objective:  BP 140/80 mmHg  Pulse 46  Wt 185 lb (83.915 kg)  SpO2 95%  BP Readings from Last 3 Encounters:  08/22/15 140/80  05/31/15 126/76  05/23/15 124/74    Wt Readings from Last 3 Encounters:  08/22/15 185 lb (83.915 kg)  05/31/15 178 lb (80.74 kg)  05/23/15 177 lb (80.287 kg)    Physical Exam  Constitutional: He is oriented to person, place, and time. He appears well-developed. No distress.  NAD  HENT:  Mouth/Throat: Oropharynx is clear and moist.  Eyes: Conjunctivae are normal. Pupils are equal,  round, and reactive to light.  Neck: Normal range of motion. No JVD present. No thyromegaly present.  Cardiovascular: Normal rate, regular rhythm, normal heart sounds and intact distal pulses.  Exam reveals no gallop and no friction rub.   No murmur heard. Pulmonary/Chest: Effort normal and breath sounds normal. No respiratory distress. He has no wheezes. He has no rales. He exhibits no tenderness.  Abdominal: Soft. Bowel sounds are normal. He exhibits  no distension and no mass. There is no tenderness. There is no rebound and no guarding.  Musculoskeletal: Normal range of motion. He exhibits no edema or tenderness.  Lymphadenopathy:    He has no cervical adenopathy.  Neurological: He is alert and oriented to person, place, and time. He has normal reflexes. No cranial nerve deficit. He exhibits normal muscle tone. He displays a negative Romberg sign. Coordination and gait normal.  Skin: Skin is warm and dry. No rash noted.  Psychiatric: He has a normal mood and affect. His behavior is normal. Judgment and thought content normal.  ataxic, no cane  Lab Results  Component Value Date   WBC 6.2 03/08/2014   HGB 14.9 03/08/2014   HCT 44.4 03/08/2014   PLT 126.0* 03/08/2014   GLUCOSE 91 05/23/2015   CHOL 110 05/25/2013   TRIG 83.0 05/25/2013   HDL 39.00* 05/25/2013   LDLCALC 54 05/25/2013   ALT 13 05/23/2015   AST 18 05/23/2015   NA 143 05/23/2015   K 4.6 05/23/2015   CL 106 05/23/2015   CREATININE 1.78* 05/23/2015   BUN 41* 05/23/2015   CO2 26 05/23/2015   TSH 2.42 05/23/2015   PSA 0.24 03/08/2014    Dg Chest 2 View  01/23/2015   CLINICAL DATA:  81 year old male with cough and congestion for 1 month. Initial encounter.  EXAM: CHEST  2 VIEW  COMPARISON:  03/22/2014 and earlier.  FINDINGS: Stable lung volumes. Stable cardiomegaly and mediastinal contours. No pneumothorax or pulmonary edema. No pleural effusion or consolidation. Nipple shadows re - identified. No areas of acute pulmonary opacity. Chronically advanced degenerative changes at the left shoulder. No acute osseous abnormality identified.  IMPRESSION: No acute cardiopulmonary abnormality.   Electronically Signed   By: Genevie Ann M.D.   On: 01/23/2015 12:57    Assessment & Plan:   There are no diagnoses linked to this encounter. I have discontinued Mr. Chui fluorouracil. I am also having him maintain his aspirin EC, gabapentin, traMADol, triamcinolone cream,  clotrimazole-betamethasone, promethazine-codeine, sildenafil, ALPRAZolam, simvastatin, furosemide, metoprolol succinate, losartan-hydrochlorothiazide, diclofenac, and hydrocortisone. We will continue to administer methylPREDNISolone acetate.  Meds ordered this encounter  Medications  . DISCONTD: fluorouracil (EFUDEX) 5 % cream    Sig:     Refill:  0  . hydrocortisone 2.5 % cream    Sig:     Refill:  1     Follow-up: No Follow-up on file.  Walker Kehr, MD

## 2015-08-23 ENCOUNTER — Ambulatory Visit: Payer: Medicare Other | Admitting: Internal Medicine

## 2015-08-23 ENCOUNTER — Other Ambulatory Visit (INDEPENDENT_AMBULATORY_CARE_PROVIDER_SITE_OTHER): Payer: Medicare Other

## 2015-08-23 DIAGNOSIS — R899 Unspecified abnormal finding in specimens from other organs, systems and tissues: Secondary | ICD-10-CM | POA: Diagnosis not present

## 2015-08-23 LAB — BASIC METABOLIC PANEL
BUN: 41 mg/dL — AB (ref 6–23)
CHLORIDE: 104 meq/L (ref 96–112)
CO2: 28 meq/L (ref 19–32)
CREATININE: 1.61 mg/dL — AB (ref 0.40–1.50)
Calcium: 8.9 mg/dL (ref 8.4–10.5)
GFR: 42.72 mL/min — ABNORMAL LOW (ref >60.00)
GLUCOSE: 82 mg/dL (ref 70–99)
Potassium: 4.7 mEq/L (ref 3.5–5.1)
Sodium: 139 mEq/L (ref 135–145)

## 2015-10-05 ENCOUNTER — Other Ambulatory Visit (INDEPENDENT_AMBULATORY_CARE_PROVIDER_SITE_OTHER): Payer: Medicare Other

## 2015-10-05 ENCOUNTER — Ambulatory Visit (INDEPENDENT_AMBULATORY_CARE_PROVIDER_SITE_OTHER): Payer: Medicare Other | Admitting: Internal Medicine

## 2015-10-05 ENCOUNTER — Telehealth: Payer: Self-pay | Admitting: Internal Medicine

## 2015-10-05 ENCOUNTER — Encounter: Payer: Self-pay | Admitting: Internal Medicine

## 2015-10-05 ENCOUNTER — Ambulatory Visit (INDEPENDENT_AMBULATORY_CARE_PROVIDER_SITE_OTHER)
Admission: RE | Admit: 2015-10-05 | Discharge: 2015-10-05 | Disposition: A | Discharge status: Home / Self Care | Payer: Medicare Other | Source: Ambulatory Visit | Attending: Internal Medicine | Admitting: Internal Medicine

## 2015-10-05 VITALS — BP 120/70 | HR 39 | Temp 98.0°F | Wt 187.0 lb

## 2015-10-05 DIAGNOSIS — I5023 Acute on chronic systolic (congestive) heart failure: Secondary | ICD-10-CM | POA: Diagnosis not present

## 2015-10-05 DIAGNOSIS — R1033 Periumbilical pain: Secondary | ICD-10-CM | POA: Diagnosis not present

## 2015-10-05 DIAGNOSIS — R109 Unspecified abdominal pain: Secondary | ICD-10-CM | POA: Insufficient documentation

## 2015-10-05 LAB — CBC WITH DIFFERENTIAL/PLATELET
BASOS PCT: 0.3 % (ref 0.0–3.0)
Basophils Absolute: 0 10*3/uL (ref 0.0–0.1)
EOS ABS: 0.2 10*3/uL (ref 0.0–0.7)
Eosinophils Relative: 3.4 % (ref 0.0–5.0)
HEMATOCRIT: 41.9 % (ref 39.0–52.0)
Hemoglobin: 14 g/dL (ref 13.0–17.0)
LYMPHS ABS: 1 10*3/uL (ref 0.7–4.0)
LYMPHS PCT: 16.6 % (ref 12.0–46.0)
MCHC: 33.4 g/dL (ref 30.0–36.0)
MCV: 94.3 fl (ref 78.0–100.0)
MONOS PCT: 9.2 % (ref 3.0–12.0)
Monocytes Absolute: 0.6 10*3/uL (ref 0.1–1.0)
NEUTROS ABS: 4.5 10*3/uL (ref 1.4–7.7)
NEUTROS PCT: 70.5 % (ref 43.0–77.0)
PLATELETS: 120 10*3/uL — AB (ref 150.0–400.0)
RBC: 4.44 Mil/uL (ref 4.22–5.81)
RDW: 13.8 % (ref 11.5–15.5)
WBC: 6.3 10*3/uL (ref 4.0–10.5)

## 2015-10-05 LAB — URINALYSIS
BILIRUBIN URINE: NEGATIVE
HGB URINE DIPSTICK: NEGATIVE
Ketones, ur: NEGATIVE
LEUKOCYTES UA: NEGATIVE
NITRITE: NEGATIVE
Specific Gravity, Urine: 1.015 (ref 1.000–1.030)
TOTAL PROTEIN, URINE-UPE24: NEGATIVE
UROBILINOGEN UA: 0.2 (ref 0.0–1.0)
Urine Glucose: NEGATIVE
pH: 5.5 (ref 5.0–8.0)

## 2015-10-05 LAB — BASIC METABOLIC PANEL
BUN: 50 mg/dL — AB (ref 6–23)
CHLORIDE: 104 meq/L (ref 96–112)
CO2: 26 mEq/L (ref 19–32)
CREATININE: 2.01 mg/dL — AB (ref 0.40–1.50)
Calcium: 9 mg/dL (ref 8.4–10.5)
GFR: 33.06 mL/min — AB (ref >60.00)
Glucose, Bld: 90 mg/dL (ref 70–99)
Potassium: 4.9 mEq/L (ref 3.5–5.1)
Sodium: 139 mEq/L (ref 135–145)

## 2015-10-05 LAB — HEPATIC FUNCTION PANEL
ALT: 15 U/L (ref 0–53)
AST: 20 U/L (ref 0–37)
Albumin: 3.8 g/dL (ref 3.5–5.2)
Alkaline Phosphatase: 113 U/L (ref 39–117)
BILIRUBIN DIRECT: 0.3 mg/dL (ref 0.0–0.3)
BILIRUBIN TOTAL: 1.3 mg/dL — AB (ref 0.2–1.2)
Total Protein: 6.8 g/dL (ref 6.0–8.3)

## 2015-10-05 LAB — SEDIMENTATION RATE: SED RATE: 23 mm/h — AB (ref 0–22)

## 2015-10-05 MED ORDER — HYDROMORPHONE HCL 2 MG PO TABS: 2.0000 mg | ORAL_TABLET | Freq: Four times a day (QID) | ORAL | Status: DC | PRN

## 2015-10-05 NOTE — Assessment & Plan Note (Addendum)
11/16 central abd cramps of ?etiology. R/o obsipation, ulcer, ischemia etc. Nl exam today 11/16 central abd cramps of ?etiology Hold Voltaren, Simvastatin Nexium XR 1 a day Labs X ray 1 view CT scan abd Dilaudid prn  Potential benefits of a short term opioids use as well as potential risks (i.e. addiction risk, apnea etc) and complications (i.e. Somnolence, constipation and others) were explained to the patient and were aknowledged.

## 2015-10-05 NOTE — Progress Notes (Signed)
Pre visit review using our clinic review tool, if applicable. No additional management support is needed unless otherwise documented below in the visit note. 

## 2015-10-05 NOTE — Progress Notes (Signed)
Subjective:  Patient ID: [name] [name], male    DOB: 02/14/1920  Age: 81 y.o. MRN: [id number]  CC: No chief complaint on file.   HPI [place] presents for central abd cramps 20-60 min before ore after meals 0-3/day x several day (pain is 5-9/10). C/o bloating; some constipation. No fever or chills. No n/v  Outpatient Prescriptions Prior to Visit  Medication Sig Dispense Refill  . ALPRAZolam (XANAX) 1 MG tablet Take 1 tablet (1 mg total) by mouth at bedtime. 60 tablet 3  . aspirin EC 81 MG tablet Take 81 mg by mouth daily.    . clotrimazole-betamethasone (LOTRISONE) cream Apply 1 application topically 2 (two) times daily. 15 g 1  . diclofenac (VOLTAREN) 75 MG EC tablet Take 1 tablet (75 mg total) by mouth 2 (two) times daily. 60 tablet 3  . furosemide (LASIX) 20 MG tablet Take 1 tablet (20 mg total) by mouth daily. 90 tablet 3  . gabapentin (NEURONTIN) 100 MG capsule Take 1 capsule (100 mg total) by mouth 3 (three) times daily as needed (neuropathy). 90 capsule 5  . hydrocortisone 2.5 % cream   1  . losartan-hydrochlorothiazide (HYZAAR) 50-12.5 MG per tablet TAKE 1 BY MOUTH DAILY 90 tablet 3  . metoprolol succinate (TOPROL-XL) 50 MG 24 hr tablet Take 1 tablet (50 mg total) by mouth daily. 30 tablet 1  . sildenafil (REVATIO) 20 MG tablet Take 1-3 tablets (20-60 mg total) by mouth daily as needed. 60 tablet 3  . simvastatin (ZOCOR) 40 MG tablet TAKE 1/2 BY MOUTH AT BEDTIME 45 tablet 5  . triamcinolone cream (KENALOG) 0.5 % Apply 1 application topically 4 (four) times daily. 45 g 1   Facility-Administered Medications Prior to Visit  Medication Dose Route Frequency Provider Last Rate Last Dose  . methylPREDNISolone acetate (DEPO-MEDROL) injection 40 mg  40 mg Intra-articular Once Aleksei Plotnikov V, MD        ROS Review of Systems  Constitutional: Negative for fever, appetite change, fatigue and unexpected weight change.  HENT: Negative for congestion, nosebleeds, sneezing, sore throat  and trouble swallowing.   Eyes: Negative for itching and visual disturbance.  Respiratory: Negative for cough and shortness of breath.   Cardiovascular: Negative for chest pain, palpitations and leg swelling.  Gastrointestinal: Positive for abdominal pain, constipation and abdominal distention. Negative for nausea, vomiting, diarrhea, blood in stool and rectal pain.  Genitourinary: Negative for frequency and hematuria.  Musculoskeletal: Negative for back pain, joint swelling, gait problem and neck pain.  Skin: Negative for rash.  Neurological: Negative for dizziness, tremors, speech difficulty and weakness.  Psychiatric/Behavioral: Negative for sleep disturbance, dysphoric mood, decreased concentration and agitation. The patient is not nervous/anxious.     Objective:  BP 120/70 mmHg  Pulse 39  Temp(Src) 98 F (36.7 C) (Oral)  Wt 187 lb (84.823 kg)  SpO2 95%  BP Readings from Last 3 Encounters:  10/05/15 120/70  08/22/15 140/80  05/31/15 126/76    Wt Readings from Last 3 Encounters:  10/05/15 187 lb (84.823 kg)  08/22/15 185 lb (83.915 kg)  05/31/15 178 lb (80.74 kg)    Physical Exam  Constitutional: He is oriented to person, place, and time. He appears well-developed. No distress.  NAD  HENT:  Mouth/Throat: Oropharynx is clear and moist.  Eyes: Conjunctivae are normal. Pupils are equal, round, and reactive to light.  Neck: Normal range of motion. No JVD present. No thyromegaly present.  Cardiovascular: Normal rate, regular rhythm, normal heart sounds and intact  distal pulses.  Exam reveals no gallop and no friction rub.   No murmur heard. Pulmonary/Chest: Effort normal and breath sounds normal. No respiratory distress. He has no wheezes. He has no rales. He exhibits no tenderness.  Abdominal: Soft. Bowel sounds are normal. He exhibits no distension and no mass. There is no tenderness. There is no rebound and no guarding.  Musculoskeletal: Normal range of motion. He exhibits  no edema or tenderness.  Lymphadenopathy:    He has no cervical adenopathy.  Neurological: He is alert and oriented to person, place, and time. He has normal reflexes. No cranial nerve deficit. He exhibits normal muscle tone. He displays a negative Romberg sign. Coordination and gait normal.  Skin: Skin is warm and dry. No rash noted.  Psychiatric: He has a normal mood and affect. His behavior is normal. Judgment and thought content normal.  Nontoxic  Lab Results  Component Value Date   WBC 6.2 03/08/2014   HGB 14.9 03/08/2014   HCT 44.4 03/08/2014   PLT 126.0* 03/08/2014   GLUCOSE 82 08/23/2015   CHOL 110 05/25/2013   TRIG 83.0 05/25/2013   HDL 39.00* 05/25/2013   LDLCALC 54 05/25/2013   ALT 13 05/23/2015   AST 18 05/23/2015   NA 139 08/23/2015   K 4.7 08/23/2015   CL 104 08/23/2015   CREATININE 1.61* 08/23/2015   BUN 41* 08/23/2015   CO2 28 08/23/2015   TSH 2.42 05/23/2015   PSA 0.24 03/08/2014    Dg Chest 2 View  01/23/2015  CLINICAL DATA:  81 year old male with cough and congestion for 1 month. Initial encounter. EXAM: CHEST  2 VIEW COMPARISON:  03/22/2014 and earlier. FINDINGS: Stable lung volumes. Stable cardiomegaly and mediastinal contours. No pneumothorax or pulmonary edema. No pleural effusion or consolidation. Nipple shadows re - identified. No areas of acute pulmonary opacity. Chronically advanced degenerative changes at the left shoulder. No acute osseous abnormality identified. IMPRESSION: No acute cardiopulmonary abnormality. Electronically Signed   By: Genevie Ann M.D.   On: 01/23/2015 12:57    Assessment & Plan:   Diagnoses and all orders for this visit:  Periumbilical abdominal pain -     CBC with Differential/Platelet; Future -     Basic metabolic panel; Future -     Hepatic function panel; Future -     Sedimentation rate; Future -     Urinalysis; Future -     DG Abd 2 Views; Future  Other orders -     HYDROmorphone (DILAUDID) 2 MG tablet; Take 1-2  tablets (2-4 mg total) by mouth every 6 (six) hours as needed for severe pain.  I am having Mr. Hymon start on HYDROmorphone. I am also having him maintain his aspirin EC, triamcinolone cream, clotrimazole-betamethasone, sildenafil, simvastatin, furosemide, metoprolol succinate, losartan-hydrochlorothiazide, hydrocortisone, diclofenac, ALPRAZolam, and gabapentin. We will continue to administer methylPREDNISolone acetate.  Meds ordered this encounter  Medications  . HYDROmorphone (DILAUDID) 2 MG tablet    Sig: Take 1-2 tablets (2-4 mg total) by mouth every 6 (six) hours as needed for severe pain.    Dispense:  40 tablet    Refill:  0     Follow-up: Return in about 1 week (around 10/12/2015).  Walker Kehr, MD

## 2015-10-05 NOTE — Patient Instructions (Addendum)
Hold Voltaren, Simvastatin Go to ER if worse Nexium XR one a day

## 2015-10-05 NOTE — Assessment & Plan Note (Signed)
Clinically compensated Toprol, Furosemide

## 2015-10-05 NOTE — Telephone Encounter (Signed)
Error

## 2015-10-10 ENCOUNTER — Ambulatory Visit (INDEPENDENT_AMBULATORY_CARE_PROVIDER_SITE_OTHER): Payer: Medicare Other | Admitting: Internal Medicine

## 2015-10-10 ENCOUNTER — Encounter: Payer: Self-pay | Admitting: Internal Medicine

## 2015-10-10 VITALS — BP 160/84 | HR 61 | Wt 187.0 lb

## 2015-10-10 DIAGNOSIS — K5901 Slow transit constipation: Secondary | ICD-10-CM

## 2015-10-10 DIAGNOSIS — R1084 Generalized abdominal pain: Secondary | ICD-10-CM

## 2015-10-10 DIAGNOSIS — K5900 Constipation, unspecified: Secondary | ICD-10-CM | POA: Insufficient documentation

## 2015-10-10 MED ORDER — POLYETHYLENE GLYCOL 3350 17 GM/SCOOP PO POWD: 17.0000 g | Freq: Two times a day (BID) | ORAL | Status: DC | PRN

## 2015-10-10 NOTE — Progress Notes (Signed)
Subjective:  Patient ID: [name] [name], male    DOB: [birth year]  Age: 81 y.o. MRN: [id number]  CC: No chief complaint on file.   HPI [name] [name] presents for abd pain - resolved after we treated constipation  Outpatient Prescriptions Prior to Visit  Medication Sig Dispense Refill  . ALPRAZolam (XANAX) 1 MG tablet Take 1 tablet (1 mg total) by mouth at bedtime. 60 tablet 3  . aspirin EC 81 MG tablet Take 81 mg by mouth daily.    . clotrimazole-betamethasone (LOTRISONE) cream Apply 1 application topically 2 (two) times daily. 15 g 1  . diclofenac (VOLTAREN) 75 MG EC tablet Take 1 tablet (75 mg total) by mouth 2 (two) times daily. 60 tablet 3  . furosemide (LASIX) 20 MG tablet Take 1 tablet (20 mg total) by mouth daily. 90 tablet 3  . gabapentin (NEURONTIN) 100 MG capsule Take 1 capsule (100 mg total) by mouth 3 (three) times daily as needed (neuropathy). 90 capsule 5  . hydrocortisone 2.5 % cream   1  . HYDROmorphone (DILAUDID) 2 MG tablet Take 1-2 tablets (2-4 mg total) by mouth every 6 (six) hours as needed for severe pain. 40 tablet 0  . losartan-hydrochlorothiazide (HYZAAR) 50-12.5 MG per tablet TAKE 1 BY MOUTH DAILY 90 tablet 3  . metoprolol succinate (TOPROL-XL) 50 MG 24 hr tablet Take 1 tablet (50 mg total) by mouth daily. 30 tablet 1  . sildenafil (REVATIO) 20 MG tablet Take 1-3 tablets (20-60 mg total) by mouth daily as needed. 60 tablet 3  . simvastatin (ZOCOR) 40 MG tablet TAKE 1/2 BY MOUTH AT BEDTIME 45 tablet 5  . triamcinolone cream (KENALOG) 0.5 % Apply 1 application topically 4 (four) times daily. 45 g 1   Facility-Administered Medications Prior to Visit  Medication Dose Route Frequency Provider Last Rate Last Dose  . methylPREDNISolone acetate (DEPO-MEDROL) injection 40 mg  40 mg Intra-articular Once Aleksei Plotnikov V, MD        ROS Review of Systems  Constitutional: Negative for appetite change, fatigue and unexpected weight change.  HENT: Negative for congestion,  nosebleeds, sneezing, sore throat and trouble swallowing.   Eyes: Negative for itching and visual disturbance.  Respiratory: Negative for cough.   Cardiovascular: Negative for chest pain, palpitations and leg swelling.  Gastrointestinal: Negative for nausea, diarrhea, blood in stool and abdominal distention.  Genitourinary: Negative for frequency and hematuria.  Musculoskeletal: Negative for back pain, joint swelling, gait problem and neck pain.  Skin: Negative for rash.  Neurological: Negative for dizziness, tremors, speech difficulty and weakness.  Psychiatric/Behavioral: Negative for sleep disturbance, dysphoric mood and agitation. The patient is not nervous/anxious.     Objective:  BP 160/84 mmHg  Pulse 61  Wt 187 lb (84.823 kg)  SpO2 98%  BP Readings from Last 3 Encounters:  10/10/15 160/84  10/05/15 120/70  08/22/15 140/80    Wt Readings from Last 3 Encounters:  10/10/15 187 lb (84.823 kg)  10/05/15 187 lb (84.823 kg)  08/22/15 185 lb (83.915 kg)    Physical Exam  Constitutional: He is oriented to person, place, and time. He appears well-developed. No distress.  NAD  HENT:  Mouth/Throat: Oropharynx is clear and moist.  Eyes: Conjunctivae are normal. Pupils are equal, round, and reactive to light.  Neck: Normal range of motion. No JVD present. No thyromegaly present.  Cardiovascular: Normal rate, regular rhythm, normal heart sounds and intact distal pulses.  Exam reveals no gallop and no friction rub.   No  murmur heard. Pulmonary/Chest: Effort normal and breath sounds normal. No respiratory distress. He has no wheezes. He has no rales. He exhibits no tenderness.  Abdominal: Soft. Bowel sounds are normal. He exhibits no distension and no mass. There is no tenderness. There is no rebound and no guarding.  Musculoskeletal: Normal range of motion. He exhibits no edema or tenderness.  Lymphadenopathy:    He has no cervical adenopathy.  Neurological: He is alert and  oriented to person, place, and time. He has normal reflexes. No cranial nerve deficit. He exhibits normal muscle tone. He displays a negative Romberg sign. Coordination and gait normal.  Skin: Skin is warm and dry. No rash noted.  Psychiatric: He has a normal mood and affect. His behavior is normal. Judgment and thought content normal.    Lab Results  Component Value Date   WBC 6.3 10/05/2015   HGB 14.0 10/05/2015   HCT 41.9 10/05/2015   PLT 120.0* 10/05/2015   GLUCOSE 90 10/05/2015   CHOL 110 05/25/2013   TRIG 83.0 05/25/2013   HDL 39.00* 05/25/2013   LDLCALC 54 05/25/2013   ALT 15 10/05/2015   AST 20 10/05/2015   NA 139 10/05/2015   K 4.9 10/05/2015   CL 104 10/05/2015   CREATININE 2.01* 10/05/2015   BUN 50* 10/05/2015   CO2 26 10/05/2015   TSH 2.42 05/23/2015   PSA 0.24 03/08/2014    Dg Abd 2 Views  10/05/2015  CLINICAL DATA:  Generalized abdominal pain for 7 days. Mild constipation. EXAM: ABDOMEN - 2 VIEW COMPARISON:  None. FINDINGS: There is a large amount of stool throughout the colon. There is no bowel dilatation to suggest obstruction. There is no evidence of pneumoperitoneum, portal venous gas or pneumatosis. There are no pathologic calcifications along the expected course of the ureters. There is lumbar spine spondylosis. There is blunting of the costophrenic angles bilaterally which may reflect fibrosis versus trace pleural effusions. IMPRESSION: Large amount of stool throughout the colon. Electronically Signed   By: Kathreen Devoid   On: 10/05/2015 16:05    Assessment & Plan:   There are no diagnoses linked to this encounter. I am having [name] [name] maintain his aspirin EC, triamcinolone cream, clotrimazole-betamethasone, sildenafil, simvastatin, furosemide, metoprolol succinate, losartan-hydrochlorothiazide, hydrocortisone, diclofenac, ALPRAZolam, gabapentin, and HYDROmorphone. We will continue to administer methylPREDNISolone acetate.  No orders of the defined types  were placed in this encounter.     Follow-up: No Follow-up on file.  Walker Kehr, MD

## 2015-10-10 NOTE — Assessment & Plan Note (Signed)
2016 Miralax prn

## 2015-10-10 NOTE — Assessment & Plan Note (Signed)
X ray - severe constipation (resolved). No pain

## 2015-10-10 NOTE — Progress Notes (Signed)
Pre visit review using our clinic review tool, if applicable. No additional management support is needed unless otherwise documented below in the visit note. 

## 2015-11-22 ENCOUNTER — Ambulatory Visit: Payer: Medicare Other | Admitting: Internal Medicine

## 2015-11-22 ENCOUNTER — Telehealth: Payer: Self-pay | Admitting: Internal Medicine

## 2015-11-22 DIAGNOSIS — N400 Enlarged prostate without lower urinary tract symptoms: Secondary | ICD-10-CM

## 2015-11-22 NOTE — Telephone Encounter (Signed)
Patient states that he is requesting a referral to urology. They can see him tomorrow, but a provider referral is needed.   Dr Lawerance Bach [facility]  Urology- [place] in [place]  # [phone]

## 2015-11-22 NOTE — Telephone Encounter (Signed)
Herbert Deaner called from Dr. Rosana Hoes' office state that [name] has an appt with them on 11/24/2015 for new pt with Dr. Rosana Hoes urology problem. He has [facility] so they need referral from our office in order for the insurance to pay for. Please help ASAP the appt is this Friday

## 2015-11-22 NOTE — Telephone Encounter (Signed)
OK. Thx

## 2015-11-23 NOTE — Telephone Encounter (Signed)
Referral faxed to Dr Rosana Hoes

## 2015-11-24 ENCOUNTER — Encounter: Payer: Self-pay | Admitting: Internal Medicine

## 2015-11-24 ENCOUNTER — Ambulatory Visit (INDEPENDENT_AMBULATORY_CARE_PROVIDER_SITE_OTHER): Payer: Medicare Other | Admitting: Internal Medicine

## 2015-11-24 VITALS — BP 150/92 | HR 82 | Wt 189.0 lb

## 2015-11-24 DIAGNOSIS — R3915 Urgency of urination: Secondary | ICD-10-CM | POA: Insufficient documentation

## 2015-11-24 DIAGNOSIS — F341 Dysthymic disorder: Secondary | ICD-10-CM

## 2015-11-24 DIAGNOSIS — I10 Essential (primary) hypertension: Secondary | ICD-10-CM | POA: Diagnosis not present

## 2015-11-24 DIAGNOSIS — K5901 Slow transit constipation: Secondary | ICD-10-CM | POA: Diagnosis not present

## 2015-11-24 DIAGNOSIS — I2510 Atherosclerotic heart disease of native coronary artery without angina pectoris: Secondary | ICD-10-CM

## 2015-11-24 DIAGNOSIS — R351 Nocturia: Secondary | ICD-10-CM | POA: Insufficient documentation

## 2015-11-24 NOTE — Progress Notes (Signed)
Subjective:  Patient ID: [name] [name], male    DOB: 1920/02/22  Age: 82 y.o. MRN: [id number]  CC: No chief complaint on file.   HPI [name] [name] presents for CAD, HTN, BPH, constipation f/u  Outpatient Prescriptions Prior to Visit  Medication Sig Dispense Refill  . ALPRAZolam (XANAX) 1 MG tablet Take 1 tablet (1 mg total) by mouth at bedtime. 60 tablet 3  . aspirin EC 81 MG tablet Take 81 mg by mouth daily.    . clotrimazole-betamethasone (LOTRISONE) cream Apply 1 application topically 2 (two) times daily. 15 g 1  . diclofenac (VOLTAREN) 75 MG EC tablet Take 1 tablet (75 mg total) by mouth 2 (two) times daily. 60 tablet 3  . furosemide (LASIX) 20 MG tablet Take 1 tablet (20 mg total) by mouth daily. 90 tablet 3  . gabapentin (NEURONTIN) 100 MG capsule Take 1 capsule (100 mg total) by mouth 3 (three) times daily as needed (neuropathy). 90 capsule 5  . hydrocortisone 2.5 % cream   1  . HYDROmorphone (DILAUDID) 2 MG tablet Take 1-2 tablets (2-4 mg total) by mouth every 6 (six) hours as needed for severe pain. 40 tablet 0  . losartan-hydrochlorothiazide (HYZAAR) 50-12.5 MG per tablet TAKE 1 BY MOUTH DAILY 90 tablet 3  . metoprolol succinate (TOPROL-XL) 50 MG 24 hr tablet Take 1 tablet (50 mg total) by mouth daily. 30 tablet 1  . polyethylene glycol powder (GLYCOLAX/MIRALAX) powder Take 17 g by mouth 2 (two) times daily as needed. 500 g 1  . sildenafil (REVATIO) 20 MG tablet Take 1-3 tablets (20-60 mg total) by mouth daily as needed. 60 tablet 3  . simvastatin (ZOCOR) 40 MG tablet TAKE 1/2 BY MOUTH AT BEDTIME 45 tablet 5  . triamcinolone cream (KENALOG) 0.5 % Apply 1 application topically 4 (four) times daily. 45 g 1   Facility-Administered Medications Prior to Visit  Medication Dose Route Frequency Provider Last Rate Last Dose  . methylPREDNISolone acetate (DEPO-MEDROL) injection 40 mg  40 mg Intra-articular Once Aleksei Plotnikov V, MD        ROS Review of Systems  Constitutional:  Negative for appetite change, fatigue and unexpected weight change.  HENT: Negative for congestion, nosebleeds, sneezing, sore throat and trouble swallowing.   Eyes: Positive for visual disturbance. Negative for itching.  Respiratory: Negative for cough.   Cardiovascular: Negative for chest pain, palpitations and leg swelling.  Gastrointestinal: Negative for nausea, diarrhea, blood in stool and abdominal distention.  Genitourinary: Negative for frequency and hematuria.  Musculoskeletal: Positive for back pain and gait problem. Negative for joint swelling and neck pain.  Skin: Negative for rash.  Neurological: Negative for dizziness, tremors, speech difficulty and weakness.  Psychiatric/Behavioral: Negative for sleep disturbance, dysphoric mood and agitation. The patient is not nervous/anxious.     Objective:  BP 150/92 mmHg  Pulse 82  Wt 189 lb (85.73 kg)  SpO2 91%  BP Readings from Last 3 Encounters:  11/24/15 150/92  10/10/15 160/84  10/05/15 120/70    Wt Readings from Last 3 Encounters:  11/24/15 189 lb (85.73 kg)  10/10/15 187 lb (84.823 kg)  10/05/15 187 lb (84.823 kg)    Physical Exam  Constitutional: He is oriented to person, place, and time. He appears well-developed. No distress.  NAD  HENT:  Mouth/Throat: Oropharynx is clear and moist.  Eyes: Conjunctivae are normal. Pupils are equal, round, and reactive to light.  Neck: Normal range of motion. No JVD present. No thyromegaly present.  Cardiovascular: Normal  rate, regular rhythm, normal heart sounds and intact distal pulses.  Exam reveals no gallop and no friction rub.   No murmur heard. Pulmonary/Chest: Effort normal and breath sounds normal. No respiratory distress. He has no wheezes. He has no rales. He exhibits no tenderness.  Abdominal: Soft. Bowel sounds are normal. He exhibits no distension and no mass. There is no tenderness. There is no rebound and no guarding.  Musculoskeletal: Normal range of motion. He  exhibits tenderness. He exhibits no edema.  Lymphadenopathy:    He has no cervical adenopathy.  Neurological: He is alert and oriented to person, place, and time. He has normal reflexes. No cranial nerve deficit. He exhibits normal muscle tone. He displays a negative Romberg sign. Coordination abnormal. Gait normal.  Skin: Skin is warm and dry. No rash noted.  Psychiatric: He has a normal mood and affect. His behavior is normal. Judgment and thought content normal.    Lab Results  Component Value Date   WBC 6.3 10/05/2015   HGB 14.0 10/05/2015   HCT 41.9 10/05/2015   PLT 120.0* 10/05/2015   GLUCOSE 90 10/05/2015   CHOL 110 05/25/2013   TRIG 83.0 05/25/2013   HDL 39.00* 05/25/2013   LDLCALC 54 05/25/2013   ALT 15 10/05/2015   AST 20 10/05/2015   NA 139 10/05/2015   K 4.9 10/05/2015   CL 104 10/05/2015   CREATININE 2.01* 10/05/2015   BUN 50* 10/05/2015   CO2 26 10/05/2015   TSH 2.42 05/23/2015   PSA 0.24 03/08/2014    Dg Abd 2 Views  10/05/2015  CLINICAL DATA:  Generalized abdominal pain for 7 days. Mild constipation. EXAM: ABDOMEN - 2 VIEW COMPARISON:  None. FINDINGS: There is a large amount of stool throughout the colon. There is no bowel dilatation to suggest obstruction. There is no evidence of pneumoperitoneum, portal venous gas or pneumatosis. There are no pathologic calcifications along the expected course of the ureters. There is lumbar spine spondylosis. There is blunting of the costophrenic angles bilaterally which may reflect fibrosis versus trace pleural effusions. IMPRESSION: Large amount of stool throughout the colon. Electronically Signed   By: Kathreen Devoid   On: 10/05/2015 16:05    Assessment & Plan:   Diagnoses and all orders for this visit:  Slow transit constipation  Essential hypertension  Atherosclerosis of native coronary artery of native heart without angina pectoris  ANXIETY DEPRESSION   I am having Mr. Zipay maintain his aspirin EC,  triamcinolone cream, clotrimazole-betamethasone, sildenafil, simvastatin, furosemide, metoprolol succinate, losartan-hydrochlorothiazide, hydrocortisone, diclofenac, ALPRAZolam, gabapentin, HYDROmorphone, and polyethylene glycol powder. We will continue to administer methylPREDNISolone acetate.  No orders of the defined types were placed in this encounter.     Follow-up: No Follow-up on file.  Walker Kehr, MD

## 2015-11-24 NOTE — Progress Notes (Signed)
Pre visit review using our clinic review tool, if applicable. No additional management support is needed unless otherwise documented below in the visit note. 

## 2015-11-24 NOTE — Assessment & Plan Note (Signed)
Better on Miralax

## 2015-11-24 NOTE — Assessment & Plan Note (Signed)
On Toprol, Furosemide 

## 2015-11-24 NOTE — Assessment & Plan Note (Signed)
On Toprol, ASA 

## 2015-12-14 ENCOUNTER — Ambulatory Visit (INDEPENDENT_AMBULATORY_CARE_PROVIDER_SITE_OTHER): Payer: Medicare Other | Admitting: Internal Medicine

## 2015-12-14 ENCOUNTER — Encounter: Payer: Self-pay | Admitting: Internal Medicine

## 2015-12-14 ENCOUNTER — Other Ambulatory Visit (INDEPENDENT_AMBULATORY_CARE_PROVIDER_SITE_OTHER): Payer: Medicare Other

## 2015-12-14 VITALS — BP 130/92 | HR 85 | Temp 97.4°F | Ht 68.0 in | Wt 188.0 lb

## 2015-12-14 DIAGNOSIS — R1013 Epigastric pain: Secondary | ICD-10-CM

## 2015-12-14 LAB — CBC WITH DIFFERENTIAL/PLATELET
Basophils Absolute: 0 10*3/uL (ref 0.0–0.1)
Basophils Relative: 0.4 % (ref 0.0–3.0)
EOS PCT: 2.7 % (ref 0.0–5.0)
Eosinophils Absolute: 0.2 10*3/uL (ref 0.0–0.7)
HCT: 46.2 % (ref 39.0–52.0)
Hemoglobin: 15.4 g/dL (ref 13.0–17.0)
LYMPHS ABS: 1.3 10*3/uL (ref 0.7–4.0)
Lymphocytes Relative: 21.5 % (ref 12.0–46.0)
MCHC: 33.3 g/dL (ref 30.0–36.0)
MCV: 92.9 fl (ref 78.0–100.0)
MONO ABS: 0.7 10*3/uL (ref 0.1–1.0)
MONOS PCT: 10.7 % (ref 3.0–12.0)
NEUTROS ABS: 4.1 10*3/uL (ref 1.4–7.7)
NEUTROS PCT: 64.7 % (ref 43.0–77.0)
PLATELETS: 149 10*3/uL — AB (ref 150.0–400.0)
RBC: 4.97 Mil/uL (ref 4.22–5.81)
RDW: 13.4 % (ref 11.5–15.5)
WBC: 6.3 10*3/uL (ref 4.0–10.5)

## 2015-12-14 LAB — HEPATIC FUNCTION PANEL
ALBUMIN: 3.9 g/dL (ref 3.5–5.2)
ALT: 10 U/L (ref 0–53)
AST: 17 U/L (ref 0–37)
Alkaline Phosphatase: 113 U/L (ref 39–117)
BILIRUBIN TOTAL: 0.8 mg/dL (ref 0.2–1.2)
Bilirubin, Direct: 0.2 mg/dL (ref 0.0–0.3)
Total Protein: 7 g/dL (ref 6.0–8.3)

## 2015-12-14 LAB — LIPASE: LIPASE: 60 U/L — AB (ref 11.0–59.0)

## 2015-12-14 MED ORDER — RANITIDINE HCL 150 MG PO TABS: 150.0000 mg | ORAL_TABLET | Freq: Two times a day (BID) | ORAL | Status: DC

## 2015-12-14 NOTE — Progress Notes (Signed)
   Subjective:    Patient ID: [name] [name], male    DOB: 04-08-1920, 82 y.o.   MRN: [id number]  HPI  Over the last 2 weeks he's had intermittent bloating and "hunger pains" in the epigastric area. It can be up to level VI-VII. It is intermittent and last minutes. It is better if he eats , drinks, or has bowel movement. This has been associated with bloating. There is no clay colored stool or dark urine.   He did have constipation which is improved with MiraLAX.His stools can be slightly watery at times.  He does have frequent urination and nocturia; he sees Dr. Rosana Hoes, urologist.   He does take a baby aspirin nightly ; one cup of coffee in the morning and 1 serving of  tea daily.  Review of Systems  There is no significant cough, sputum production,hemoptysis, wheezing,or  paroxysmal nocturnal dyspnea. Unexplained weight loss, significant dyspepsia, dysphagia, melena, rectal bleeding, or persistently small caliber stools are not present. Dysuria, pyuria, hematuria,  or polyuria are denied.    Objective:   Physical Exam  Pertinent or positive findings include: His gait is broad; valgus changes of the knees are suggested. Pattern alopecia is noted. He has dense opacification of the right eye and asymmetry of the left pupil. Arcus senilis is present. He has marked ptosis greater on the right. He has a slight tachycardia with minimal irregularity. Right upper quadrant operative scar is well-healed. He has trace edema. He has flexion contractions and mixed arthritic changes in the hands.  General appearance :adequately nourished; in no distress.Appears younger than stated age   Eyes: No conjunctival inflammation or scleral icterus is present.  Oral exam:  Lips and gums are healthy appearing.There is no oropharyngeal erythema or exudate noted. Dental hygiene is good.  Heart:   S1 and S2 normal without gallop, murmur, click, rub or other extra sounds    Lungs:Chest clear to auscultation; no  wheezes, rhonchi,rales ,or rubs present.No increased work of breathing.   Abdomen: bowel sounds normal, soft and non-tender without masses, organomegaly or hernias noted.  No guarding or rebound. No flank tenderness to percussion.  Vascular : all pulses equal ; no bruits present.  Skin:Warm & dry.  Intact without suspicious lesions or rashes ; no tenting or jaundice   Lymphatic: No lymphadenopathy is noted about the head, neck, axilla.   Neuro: Strength, tone decreased.      Assessment & Plan:   #1 Epigastric pain Plan:CBC & dif  Anti reflux measures Ranitidine  pre b'fast & pre eve meal Lipase LFTs

## 2015-12-14 NOTE — Patient Instructions (Signed)
Reflux of gastric acid may be asymptomatic as this may occur mainly during sleep.The triggers for reflux  include stress; the "aspirin family" ; alcohol; peppermint; and caffeine (coffee, tea, cola, and chocolate). The aspirin family would include aspirin and the nonsteroidal agents such as ibuprofen &  Naproxen. Tylenol would not cause reflux. If having symptoms ; food & drink should be avoided for @ least 2 hours before going to bed.  

## 2015-12-14 NOTE — Progress Notes (Signed)
Pre visit review using our clinic review tool, if applicable. No additional management support is needed unless otherwise documented below in the visit note. 

## 2016-01-08 ENCOUNTER — Other Ambulatory Visit: Payer: Self-pay | Admitting: Internal Medicine

## 2016-02-16 ENCOUNTER — Encounter: Payer: Self-pay | Admitting: Internal Medicine

## 2016-02-16 ENCOUNTER — Ambulatory Visit (INDEPENDENT_AMBULATORY_CARE_PROVIDER_SITE_OTHER): Payer: Medicare Other | Admitting: Internal Medicine

## 2016-02-16 ENCOUNTER — Other Ambulatory Visit (INDEPENDENT_AMBULATORY_CARE_PROVIDER_SITE_OTHER): Payer: Medicare Other

## 2016-02-16 VITALS — BP 144/84 | HR 55 | Wt 188.0 lb

## 2016-02-16 DIAGNOSIS — I5022 Chronic systolic (congestive) heart failure: Secondary | ICD-10-CM

## 2016-02-16 DIAGNOSIS — L570 Actinic keratosis: Secondary | ICD-10-CM | POA: Diagnosis not present

## 2016-02-16 DIAGNOSIS — I10 Essential (primary) hypertension: Secondary | ICD-10-CM | POA: Diagnosis not present

## 2016-02-16 DIAGNOSIS — L718 Other rosacea: Secondary | ICD-10-CM | POA: Diagnosis not present

## 2016-02-16 DIAGNOSIS — K5901 Slow transit constipation: Secondary | ICD-10-CM | POA: Diagnosis not present

## 2016-02-16 DIAGNOSIS — D485 Neoplasm of uncertain behavior of skin: Secondary | ICD-10-CM

## 2016-02-16 DIAGNOSIS — M150 Primary generalized (osteo)arthritis: Secondary | ICD-10-CM

## 2016-02-16 DIAGNOSIS — I2510 Atherosclerotic heart disease of native coronary artery without angina pectoris: Secondary | ICD-10-CM

## 2016-02-16 DIAGNOSIS — M159 Polyosteoarthritis, unspecified: Secondary | ICD-10-CM

## 2016-02-16 DIAGNOSIS — L578 Other skin changes due to chronic exposure to nonionizing radiation: Secondary | ICD-10-CM | POA: Diagnosis not present

## 2016-02-16 DIAGNOSIS — M8949 Other hypertrophic osteoarthropathy, multiple sites: Secondary | ICD-10-CM

## 2016-02-16 LAB — BASIC METABOLIC PANEL
BUN: 50 mg/dL — AB (ref 6–23)
CALCIUM: 8.9 mg/dL (ref 8.4–10.5)
CHLORIDE: 106 meq/L (ref 96–112)
CO2: 28 meq/L (ref 19–32)
CREATININE: 1.69 mg/dL — AB (ref 0.40–1.50)
GFR: 40.35 mL/min — ABNORMAL LOW (ref >60.00)
GLUCOSE: 86 mg/dL (ref 70–99)
Potassium: 4.9 mEq/L (ref 3.5–5.1)
Sodium: 140 mEq/L (ref 135–145)

## 2016-02-16 MED ORDER — DICLOFENAC SODIUM 75 MG PO TBEC: 75.0000 mg | DELAYED_RELEASE_TABLET | Freq: Two times a day (BID) | ORAL | Status: DC

## 2016-02-16 MED ORDER — ALPRAZOLAM 1 MG PO TABS: 1.0000 mg | ORAL_TABLET | Freq: Every day | ORAL | Status: DC

## 2016-02-16 MED ORDER — SILDENAFIL CITRATE 20 MG PO TABS: 20.0000 mg | ORAL_TABLET | Freq: Every day | ORAL | Status: DC | PRN

## 2016-02-16 NOTE — Assessment & Plan Note (Signed)
Hyzaar, Furosemide, Toprol labs

## 2016-02-16 NOTE — Assessment & Plan Note (Signed)
On Toprol, Furosemide, Hyzaar Labs

## 2016-02-16 NOTE — Progress Notes (Signed)
Subjective:  Patient ID: [name] [name], male    DOB: Jan 23, 1920  Age: 82 y.o. MRN: [id number]  CC: No chief complaint on file.   HPI [name] [name] presents for HTN, CHF, dyslipidemia, CAD f/u  Outpatient Prescriptions Prior to Visit  Medication Sig Dispense Refill  . ALPRAZolam (XANAX) 1 MG tablet Take 1 tablet (1 mg total) by mouth at bedtime. 60 tablet 3  . aspirin EC 81 MG tablet Take 81 mg by mouth daily.    . clotrimazole-betamethasone (LOTRISONE) cream Apply 1 application topically 2 (two) times daily. 15 g 1  . diclofenac (VOLTAREN) 75 MG EC tablet Take 1 tablet (75 mg total) by mouth 2 (two) times daily. 60 tablet 3  . furosemide (LASIX) 20 MG tablet Take 1 tablet (20 mg total) by mouth daily. 90 tablet 3  . gabapentin (NEURONTIN) 100 MG capsule Take 1 capsule (100 mg total) by mouth 3 (three) times daily as needed (neuropathy). 90 capsule 5  . hydrocortisone 2.5 % cream   1  . HYDROmorphone (DILAUDID) 2 MG tablet Take 1-2 tablets (2-4 mg total) by mouth every 6 (six) hours as needed for severe pain. 40 tablet 0  . losartan-hydrochlorothiazide (HYZAAR) 50-12.5 MG per tablet TAKE 1 BY MOUTH DAILY 90 tablet 3  . metoprolol succinate (TOPROL-XL) 50 MG 24 hr tablet Take 1 tablet (50 mg total) by mouth daily. 30 tablet 1  . polyethylene glycol powder (GLYCOLAX/MIRALAX) powder Take 17 g by mouth 2 (two) times daily as needed. 500 g 1  . ranitidine (ZANTAC) 150 MG tablet TAKE 1 TABLET TWICE DAILY. 60 tablet 3  . sildenafil (REVATIO) 20 MG tablet Take 1-3 tablets (20-60 mg total) by mouth daily as needed. 60 tablet 3  . simvastatin (ZOCOR) 40 MG tablet TAKE 1/2 BY MOUTH AT BEDTIME 45 tablet 5  . triamcinolone cream (KENALOG) 0.5 % Apply 1 application topically 4 (four) times daily. 45 g 1   Facility-Administered Medications Prior to Visit  Medication Dose Route Frequency Provider Last Rate Last Dose  . methylPREDNISolone acetate (DEPO-MEDROL) injection 40 mg  40 mg Intra-articular Once  Aleksei Plotnikov V, MD        ROS Review of Systems  Constitutional: Negative for appetite change, fatigue and unexpected weight change.  HENT: Negative for congestion, nosebleeds, sneezing, sore throat and trouble swallowing.   Eyes: Positive for visual disturbance. Negative for itching.  Respiratory: Negative for cough.   Cardiovascular: Negative for chest pain, palpitations and leg swelling.  Gastrointestinal: Negative for nausea, diarrhea, blood in stool and abdominal distention.  Genitourinary: Positive for frequency. Negative for hematuria.  Musculoskeletal: Positive for back pain, arthralgias, gait problem and neck stiffness. Negative for joint swelling and neck pain.  Skin: Negative for rash.  Neurological: Negative for dizziness, tremors, speech difficulty and weakness.  Psychiatric/Behavioral: Negative for suicidal ideas, sleep disturbance, dysphoric mood and agitation. The patient is not nervous/anxious.     Objective:  BP 144/84 mmHg  Pulse 55  Wt 188 lb (85.276 kg)  SpO2 98%  BP Readings from Last 3 Encounters:  02/16/16 144/84  12/14/15 130/92  11/24/15 150/92    Wt Readings from Last 3 Encounters:  02/16/16 188 lb (85.276 kg)  12/14/15 188 lb (85.276 kg)  11/24/15 189 lb (85.73 kg)    Physical Exam  Constitutional: He is oriented to person, place, and time. He appears well-developed. No distress.  NAD  HENT:  Mouth/Throat: Oropharynx is clear and moist.  Eyes: Conjunctivae are normal. Pupils are  equal, round, and reactive to light.  Neck: Normal range of motion. No JVD present. No thyromegaly present.  Cardiovascular: Normal rate, regular rhythm, normal heart sounds and intact distal pulses.  Exam reveals no gallop and no friction rub.   No murmur heard. Pulmonary/Chest: Effort normal and breath sounds normal. No respiratory distress. He has no wheezes. He has no rales. He exhibits no tenderness.  Abdominal: Soft. Bowel sounds are normal. He exhibits no  distension and no mass. There is no tenderness. There is no rebound and no guarding.  Musculoskeletal: Normal range of motion. He exhibits no edema or tenderness.  Lymphadenopathy:    He has no cervical adenopathy.  Neurological: He is alert and oriented to person, place, and time. He has normal reflexes. No cranial nerve deficit. He exhibits normal muscle tone. He displays a negative Romberg sign. Coordination abnormal. Gait normal.  Skin: Skin is warm and dry. No rash noted.  Psychiatric: He has a normal mood and affect. His behavior is normal. Judgment and thought content normal.  AKs Stiff shoulders   Lab Results  Component Value Date   WBC 6.3 12/14/2015   HGB 15.4 12/14/2015   HCT 46.2 12/14/2015   PLT 149.0* 12/14/2015   GLUCOSE 90 10/05/2015   CHOL 110 05/25/2013   TRIG 83.0 05/25/2013   HDL 39.00* 05/25/2013   LDLCALC 54 05/25/2013   ALT 10 12/14/2015   AST 17 12/14/2015   NA 139 10/05/2015   K 4.9 10/05/2015   CL 104 10/05/2015   CREATININE 2.01* 10/05/2015   BUN 50* 10/05/2015   CO2 26 10/05/2015   TSH 2.42 05/23/2015   PSA 0.24 03/08/2014    Dg Abd 2 Views  10/05/2015  CLINICAL DATA:  Generalized abdominal pain for 7 days. Mild constipation. EXAM: ABDOMEN - 2 VIEW COMPARISON:  None. FINDINGS: There is a large amount of stool throughout the colon. There is no bowel dilatation to suggest obstruction. There is no evidence of pneumoperitoneum, portal venous gas or pneumatosis. There are no pathologic calcifications along the expected course of the ureters. There is lumbar spine spondylosis. There is blunting of the costophrenic angles bilaterally which may reflect fibrosis versus trace pleural effusions. IMPRESSION: Large amount of stool throughout the colon. Electronically Signed   By: Kathreen Devoid   On: 10/05/2015 16:05    Assessment & Plan:   There are no diagnoses linked to this encounter. I am having Mr. Bonadio maintain his aspirin EC, triamcinolone cream,  clotrimazole-betamethasone, sildenafil, simvastatin, furosemide, metoprolol succinate, losartan-hydrochlorothiazide, hydrocortisone, diclofenac, ALPRAZolam, gabapentin, HYDROmorphone, polyethylene glycol powder, and ranitidine. We will continue to administer methylPREDNISolone acetate.  No orders of the defined types were placed in this encounter.     Follow-up: No Follow-up on file.  Walker Kehr, MD

## 2016-02-16 NOTE — Assessment & Plan Note (Signed)
F/u Dr Allyson Sabal - AKs (head, face)

## 2016-02-16 NOTE — Assessment & Plan Note (Signed)
Miralax prn 

## 2016-02-16 NOTE — Assessment & Plan Note (Signed)
Chronic - using Diclofenac w/caution  Potential benefits of a long term NSAID use as well as potential risks  and complications were explained to the patient and were aknowledged. He wants to cont BMET

## 2016-02-16 NOTE — Progress Notes (Signed)
Pre visit review using our clinic review tool, if applicable. No additional management support is needed unless otherwise documented below in the visit note. 

## 2016-02-16 NOTE — Assessment & Plan Note (Signed)
No angina On Toprol, ASA, Simvastatin

## 2016-02-29 DIAGNOSIS — H524 Presbyopia: Secondary | ICD-10-CM | POA: Diagnosis not present

## 2016-04-09 ENCOUNTER — Ambulatory Visit: Payer: Medicare Other

## 2016-04-09 VITALS — BP 138/70 | Ht 67.0 in | Wt 188.0 lb

## 2016-04-09 DIAGNOSIS — Z0000 Encounter for general adult medical examination without abnormal findings: Secondary | ICD-10-CM

## 2016-04-09 NOTE — Patient Instructions (Addendum)
[name] , Thank you for taking time to come for your Medicare Wellness Visit. I appreciate your ongoing commitment to your health goals. Please review the following plan we discussed and let me know if I can assist you in the future.   In review; We discussed resources for friend [employer] of Social Services  [facility] [employer]  Address: [address] [place], [place] [address] Phone: [phone]  [employer]; Radio producer for [employer] [place] [email or web address]  M-F, 10 AM - 6 PM ET  Can access the [facility] for hearing screen and hearing aids  [employer]: [place], [place], [place], [place] [address] Phone: [phone]  [facility] Office: Monday-Friday [address] [place],   [address] [phone]   These are the goals we discussed: Goals    . Weight < 175 lb (79.379 kg)     Drink more water; Divide nutrients and calories more equally between meals        This is a list of the screening recommended for you and due dates:  Health Maintenance  Topic Date Due  . Shingles Vaccine  01/09/1982  . Flu Shot  06/18/2016  . Tetanus Vaccine  10/13/2022  . Pneumonia vaccines  Completed     Fall Prevention in the Home  Falls can cause injuries. They can happen to people of all ages. There are many things you can do to make your home safe and to help prevent falls.  WHAT CAN I DO ON THE OUTSIDE OF MY HOME?  Regularly fix the edges of walkways and driveways and fix any cracks.  Remove anything that might make you trip as you walk through a door, such as a raised step or threshold.  Trim any bushes or trees on the path to your home.  Use bright outdoor lighting.  Clear any walking paths of anything that might make someone trip, such as rocks or tools.  Regularly check to see if handrails are loose or broken. Make sure that both sides of any steps have handrails.  Any  raised decks and porches should have guardrails on the edges.  Have any leaves, snow, or ice cleared regularly.  Use sand or salt on walking paths during winter.  Clean up any spills in your garage right away. This includes oil or grease spills. WHAT CAN I DO IN THE BATHROOM?   Use night lights.  Install grab bars by the toilet and in the tub and shower. Do not use towel bars as grab bars.  Use non-skid mats or decals in the tub or shower.  If you need to sit down in the shower, use a plastic, non-slip stool.  Keep the floor dry. Clean up any water that spills on the floor as soon as it happens.  Remove soap buildup in the tub or shower regularly.  Attach bath mats securely with double-sided non-slip rug tape.  Do not have throw rugs and other things on the floor that can make you trip. WHAT CAN I DO IN THE BEDROOM?  Use night lights.  Make sure that you have a light by your bed that is easy to reach.  Do not use any sheets or blankets that are too big for your bed. They should not hang down onto the floor.  Have a firm chair that has side arms. You can use this for support while you get dressed.  Do not have throw rugs  and other things on the floor that can make you trip. WHAT CAN I DO IN THE KITCHEN?  Clean up any spills right away.  Avoid walking on wet floors.  Keep items that you use a lot in easy-to-reach places.  If you need to reach something above you, use a strong step stool that has a grab bar.  Keep electrical cords out of the way.  Do not use floor polish or wax that makes floors slippery. If you must use wax, use non-skid floor wax.  Do not have throw rugs and other things on the floor that can make you trip. WHAT CAN I DO WITH MY STAIRS?  Do not leave any items on the stairs.  Make sure that there are handrails on both sides of the stairs and use them. Fix handrails that are broken or loose. Make sure that handrails are as long as the  stairways.  Check any carpeting to make sure that it is firmly attached to the stairs. Fix any carpet that is loose or worn.  Avoid having throw rugs at the top or bottom of the stairs. If you do have throw rugs, attach them to the floor with carpet tape.  Make sure that you have a light switch at the top of the stairs and the bottom of the stairs. If you do not have them, ask someone to add them for you. WHAT ELSE CAN I DO TO HELP PREVENT FALLS?  Wear shoes that:  Do not have high heels.  Have rubber bottoms.  Are comfortable and fit you well.  Are closed at the toe. Do not wear sandals.  If you use a stepladder:  Make sure that it is fully opened. Do not climb a closed stepladder.  Make sure that both sides of the stepladder are locked into place.  Ask someone to hold it for you, if possible.  Clearly mark and make sure that you can see:  Any grab bars or handrails.  First and last steps.  Where the edge of each step is.  Use tools that help you move around (mobility aids) if they are needed. These include:  Canes.  Walkers.  Scooters.  Crutches.  Turn on the lights when you go into a dark area. Replace any light bulbs as soon as they burn out.  Set up your furniture so you have a clear path. Avoid moving your furniture around.  If any of your floors are uneven, fix them.  If there are any pets around you, be aware of where they are.  Review your medicines with your doctor. Some medicines can make you feel dizzy. This can increase your chance of falling. Ask your doctor what other things that you can do to help prevent falls.   This information is not intended to replace advice given to you by your health care provider. Make sure you discuss any questions you have with your health care provider.   Document Released: 08/31/2009 Document Revised: 03/21/2015 Document Reviewed: 12/09/2014 Elsevier Interactive Patient Education 2016 [employer]  Maintenance, Male A healthy lifestyle and preventative care can promote health and wellness.  Maintain regular health, dental, and eye exams.  Eat a healthy diet. Foods like vegetables, fruits, whole grains, low-fat dairy products, and lean protein foods contain the nutrients you need and are low in calories. Decrease your intake of foods high in solid fats, added sugars, and salt. Get information about a proper diet from your health care provider, if  necessary.  Regular physical exercise is one of the most important things you can do for your health. Most adults should get at least 150 minutes of moderate-intensity exercise (any activity that increases your heart rate and causes you to sweat) each week. In addition, most adults need muscle-strengthening exercises on 2 or more days a week.   Maintain a healthy weight. The body mass index (BMI) is a screening tool to identify possible weight problems. It provides an estimate of body fat based on height and weight. Your health care provider can find your BMI and can help you achieve or maintain a healthy weight. For males 20 years and older:  A BMI below 18.5 is considered underweight.  A BMI of 18.5 to 24.9 is normal.  A BMI of 25 to 29.9 is considered overweight.  A BMI of 30 and above is considered obese.  Maintain normal blood lipids and cholesterol by exercising and minimizing your intake of saturated fat. Eat a balanced diet with plenty of fruits and vegetables. Blood tests for lipids and cholesterol should begin at age 67 and be repeated every 5 years. If your lipid or cholesterol levels are high, you are over age 67, or you are at high risk for heart disease, you may need your cholesterol levels checked more frequently.Ongoing high lipid and cholesterol levels should be treated with medicines if diet and exercise are not working.  If you smoke, find out from your health care provider how to quit. If you do not use tobacco, do not  start.  Lung cancer screening is recommended for adults aged 55-80 years who are at high risk for developing lung cancer because of a history of smoking. A yearly low-dose CT scan of the lungs is recommended for people who have at least a 30-pack-year history of smoking and are current smokers or have quit within the past 15 years. A pack year of smoking is smoking an average of 1 pack of cigarettes a day for 1 year (for example, a 30-pack-year history of smoking could mean smoking 1 pack a day for 30 years or 2 packs a day for 15 years). Yearly screening should continue until the smoker has stopped smoking for at least 15 years. Yearly screening should be stopped for people who develop a health problem that would prevent them from having lung cancer treatment.  If you choose to drink alcohol, do not have more than 2 drinks per day. One drink is considered to be 12 oz (360 mL) of beer, 5 oz (150 mL) of wine, or 1.5 oz (45 mL) of liquor.  Avoid the use of street drugs. Do not share needles with anyone. Ask for help if you need support or instructions about stopping the use of drugs.  High blood pressure causes heart disease and increases the risk of stroke. High blood pressure is more likely to develop in:  People who have blood pressure in the end of the normal range (100-139/85-89 mm Hg).  People who are overweight or obese.  People who are African American.  If you are 66-42 years of age, have your blood pressure checked every 3-5 years. If you are 55 years of age or older, have your blood pressure checked every year. You should have your blood pressure measured twice--once when you are at a hospital or clinic, and once when you are not at a hospital or clinic. Record the average of the two measurements. To check your blood pressure when you are not at  a hospital or clinic, you can use:  An automated blood pressure machine at a pharmacy.  A home blood pressure monitor.  If you are 82-79 years  old, ask your health care provider if you should take aspirin to prevent heart disease.  Diabetes screening involves taking a blood sample to check your fasting blood sugar level. This should be done once every 3 years after age 39 if you are at a normal weight and without risk factors for diabetes. Testing should be considered at a younger age or be carried out more frequently if you are overweight and have at least 1 risk factor for diabetes.  Colorectal cancer can be detected and often prevented. Most routine colorectal cancer screening begins at the age of 56 and continues through age 49. However, your health care provider may recommend screening at an earlier age if you have risk factors for colon cancer. On a yearly basis, your health care provider may provide home test kits to check for hidden blood in the stool. A small camera at the end of a tube may be used to directly examine the colon (sigmoidoscopy or colonoscopy) to detect the earliest forms of colorectal cancer. Talk to your health care provider about this at age 67 when routine screening begins. A direct exam of the colon should be repeated every 5-10 years through age 40, unless early forms of precancerous polyps or small growths are found.  People who are at an increased risk for hepatitis B should be screened for this virus. You are considered at high risk for hepatitis B if:  You were born in a country where hepatitis B occurs often. Talk with your health care provider about which countries are considered high risk.  Your parents were born in a high-risk country and you have not received a shot to protect against hepatitis B (hepatitis B vaccine).  You have HIV or AIDS.  You use needles to inject street drugs.  You live with, or have sex with, someone who has hepatitis B.  You are a man who has sex with other men (MSM).  You get hemodialysis treatment.  You take certain medicines for conditions like cancer, organ  transplantation, and autoimmune conditions.  Hepatitis C blood testing is recommended for all people born from [birth year] through 1963 and any individual with known risk factors for hepatitis C.  Healthy men should no longer receive prostate-specific antigen (PSA) blood tests as part of routine cancer screening. Talk to your health care provider about prostate cancer screening.  Testicular cancer screening is not recommended for adolescents or adult males who have no symptoms. Screening includes self-exam, a health care provider exam, and other screening tests. Consult with your health care provider about any symptoms you have or any concerns you have about testicular cancer.  Practice safe sex. Use condoms and avoid high-risk sexual practices to reduce the spread of sexually transmitted infections (STIs).  You should be screened for STIs, including gonorrhea and chlamydia if:  You are sexually active and are younger than 24 years.  You are older than 24 years, and your health care provider tells you that you are at risk for this type of infection.  Your sexual activity has changed since you were last screened, and you are at an increased risk for chlamydia or gonorrhea. Ask your health care provider if you are at risk.  If you are at risk of being infected with HIV, it is recommended that you take a prescription medicine daily  to prevent HIV infection. This is called pre-exposure prophylaxis (PrEP). You are considered at risk if:  You are a man who has sex with other men (MSM).  You are a heterosexual man who is sexually active with multiple partners.  You take drugs by injection.  You are sexually active with a partner who has HIV.  Talk with your health care provider about whether you are at high risk of being infected with HIV. If you choose to begin PrEP, you should first be tested for HIV. You should then be tested every 3 months for as long as you are taking PrEP.  Use sunscreen. Apply  sunscreen liberally and repeatedly throughout the day. You should seek shade when your shadow is shorter than you. Protect yourself by wearing long sleeves, pants, a wide-brimmed hat, and sunglasses year round whenever you are outdoors.  Tell your health care provider of new moles or changes in moles, especially if there is a change in shape or color. Also, tell your health care provider if a mole is larger than the size of a pencil eraser.  A one-time screening for abdominal aortic aneurysm (AAA) and surgical repair of large AAAs by ultrasound is recommended for men aged 10-75 years who are current or former smokers.  Stay current with your vaccines (immunizations).   This information is not intended to replace advice given to you by your health care provider. Make sure you discuss any questions you have with your health care provider.   Document Released: 05/02/2008 Document Revised: 11/25/2014 Document Reviewed: 04/01/2011 Elsevier Interactive Patient Education 2016 [employer].  Hearing Loss Hearing loss is a partial or total loss of the ability to hear. This can be temporary or permanent, and it can happen in one or both ears. Hearing loss may be referred to as deafness. Medical care is necessary to treat hearing loss properly and to prevent the condition from getting worse. Your hearing may partially or completely come back, depending on what caused your hearing loss and how severe it is. In some cases, hearing loss is permanent. CAUSES Common causes of hearing loss include:   Too much wax in the ear canal.   Infection of the ear canal or middle ear.   Fluid in the middle ear.   Injury to the ear or surrounding area.   An object stuck in the ear.   Prolonged exposure to loud sounds, such as music.  Less common causes of hearing loss include:   Tumors in the ear.   Viral or bacterial infections, such as meningitis.   A hole in the eardrum (perforated  eardrum).  Problems with the hearing nerve that sends signals between the brain and the ear.  Certain medicines.  SYMPTOMS  Symptoms of this condition may include:  Difficulty telling the difference between sounds.  Difficulty following a conversation when there is background noise.  Lack of response to sounds in your environment. This may be most noticeable when you do not respond to startling sounds.  Needing to turn up the volume on the television, radio, etc.  Ringing in the ears.  Dizziness.  Pain in the ears. DIAGNOSIS This condition is diagnosed based on a physical exam and a hearing test (audiometry). The audiometry test will be performed by a hearing specialist (audiologist). You may also be referred to an ear, nose, and throat (ENT) specialist (otolaryngologist).  TREATMENT Treatment for recent onset of hearing loss may include:   Ear wax removal.   Being prescribed  medicines to prevent infection (antibiotics).   Being prescribed medicines to reduce inflammation (corticosteroids).  HOME CARE INSTRUCTIONS  If you were prescribed an antibiotic medicine, take it as told by your health care provider. Do not stop taking the antibiotic even if you start to feel better.  Take over-the-counter and prescription medicines only as told by your health care provider.  Avoid loud noises.   Return to your normal activities as told by your health care provider. Ask your health care provider what activities are safe for you.  Keep all follow-up visits as told by your health care provider. This is important. SEEK MEDICAL CARE IF:   You feel dizzy.   You develop new symptoms.   You vomit or feel nauseous.   You have a fever.  SEEK IMMEDIATE MEDICAL CARE IF:  You develop sudden changes in your vision.   You have severe ear pain.   You have new or increased weakness.  You have a severe headache.   This information is not intended to replace advice given to  you by your health care provider. Make sure you discuss any questions you have with your health care provider.   Document Released: 11/04/2005 Document Revised: 07/26/2015 Document Reviewed: 03/22/2015 Elsevier Interactive Patient Education [employer].

## 2016-04-09 NOTE — Progress Notes (Addendum)
Subjective:   [name] [name] is a 82 y.o. male who presents for Medicare Annual/Subsequent preventive examination.  Review of Systems:   HRA assessment completed during visit; Irvin_Frank  The Patient was informed that this wellness visit is to identify risk and educate on how to reduce risk for increase disease through lifestyle changes.   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 [employer] a job as a Catering manager but smoked and died of COPD at 51  Wife was a Education officer, museum; 24 years older;  Has a son retired PT; Started [employer]  Does yard work; garden takes up time;    Lives with spouse; he still fairly independent but complains a lot   Tobacco: no  ETOH no   Medication review/ Adherent;   BMI: 29.4   Diet;   Chief cook now Tried to eat a balanced meal; eats breakfast; bowel of cereal and coffee Lunch; a graham cracker and peanut butter; apple Supper; biggest meal; meat; vegetables; cantaloupe etc   Exercise;  did have a large garden; fell x 1 and son told him to quit Does have a back yard garden;    [place]; home is one level Condo is 2 levels; Has a friend that he goes to see; Has companionship; difficult situation with son; Discussed alternatives and resources for his friend. Address: [address] [place], [place] [address] Phone: [phone] [employer]; Radio producer for Mentally Ill  CALL THE [employer] HELPLINE [email or web address]  M-F, 10 AM - 6 PM ET Find p in a crisis or Text "NAMI" to [phone]   No falls;  Removal of clutter clearing paths  through the home,   Railing as needed discussed   Community safety; YES Smoke detectors YES Firearms safety reviewed and will keep in a safe place if these exist.  Driving accidents; no;   Advised to use sun protection or large brim hat; wears a cap when out in the sun Has scab on nose; Went to dermatology; creams are not helping Brought in Metronidazole and cortisone cream;  Stressors (1-5)   Depression: Denies feeling depressed or hopeless; voices pleasure in daily life  Goal;   Cognitive;  Manages checkbook, medications; no failures of task Ad8 score reviewed for issues;  Issues making decisions; no  Less interest in hobbies / activities" no  Repeats questions, stories; family complaining: NO  Trouble using ordinary gadgets; microwave; computer: no  Forgets the month or year: no  Mismanaging finances: no  Missing apt: no but does write them down  Daily problems with thinking of memory NO Ad8 score is 0   Fall assessment no Gait assessment appears normal;   Mobilization and Functional losses from last year to this year?   Sleep pattern changes; gets up to the bathroom; has declined medicine at this time  Urinary or fecal incontinence reviewed: Metamucil helping bowels   Counseling Health Maintenance Colonoscopy; aged out  EKG: 05/2015 Hearing: do now hear good; 2000 right hear; left 500hz ;  Needs hearing screen;  [employer]  [employer]: [place], [place], [place], [place] [address] Phone: [phone]  [facility] Office: Monday-Friday [address] [place], [place]  [address] [phone]   Ophthalmology exam/right eye blind; was checked recently;  About 2 months ago   Immunizations Due: Zostavax  Advanced Directive; yes; will bring copy for chart/ completed   Health Recommendations and Referrals [facility]   Barriers to Success    Current Care Team  reviewed and updated           Objective:    Vitals: BP 138/70 mmHg  Ht 5\' 7"  (1.702 m)  Wt 188 lb (85.276 kg)  BMI 29.44 kg/m2  Body mass index is 29.44 kg/(m^2).  Tobacco History  Smoking status  . Never Smoker   Smokeless tobacco  . Never Used     Counseling given: Not Answered   Past Medical History  Diagnosis Date  . Hypertension   . Hyperlipidemia   . Coronary artery disease   . Cardiomyopathy   . Hemorrhoids    Past Surgical History  Procedure Laterality Date  . Tonsilectomy, adenoidectomy, bilateral myringotomy and tubes    . Colonic polyps    . Cholecystectomy    . Inguinal hernia repair    . Appendectomy     No family history on file. History  Sexual Activity  . Sexual Activity: No    Outpatient Encounter Prescriptions as of 04/09/2016  Medication Sig  . ALPRAZolam (XANAX) 1 MG tablet Take 1 tablet (1 mg total) by mouth at bedtime.  Marland Kitchen aspirin EC 81 MG tablet Take 81 mg by mouth daily.  . furosemide (LASIX) 20 MG tablet Take 1 tablet (20 mg total) by mouth daily.  Marland Kitchen gabapentin (NEURONTIN) 100 MG capsule Take 1 capsule (100 mg total) by mouth 3 (three) times daily as needed (neuropathy).  . hydrocortisone 2.5 % cream   . losartan-hydrochlorothiazide (HYZAAR) 50-12.5 MG per tablet TAKE 1 BY MOUTH DAILY  . metoprolol succinate (TOPROL-XL) 50 MG 24 hr tablet Take 1 tablet (50 mg total) by mouth daily.  . polyethylene glycol powder (GLYCOLAX/MIRALAX) powder Take 17 g by mouth 2 (two) times daily as needed.  . ranitidine (ZANTAC) 150 MG tablet TAKE 1 TABLET TWICE DAILY.  . sildenafil (REVATIO) 20 MG tablet Take 1-3 tablets (20-60 mg total) by mouth daily as needed.  . simvastatin (ZOCOR) 40 MG tablet TAKE 1/2 BY MOUTH AT BEDTIME  . triamcinolone cream (KENALOG) 0.5 % Apply 1 application topically 4 (four) times daily.  . clotrimazole-betamethasone (LOTRISONE) cream Apply 1 application topically 2 (two) times daily. (Patient not taking: Reported on  04/09/2016)  . diclofenac (VOLTAREN) 75 MG EC tablet Take 1 tablet (75 mg total) by mouth 2 (two) times daily. (Patient not taking: Reported on 04/09/2016)   Facility-Administered Encounter Medications as of 04/09/2016  Medication  . methylPREDNISolone acetate (DEPO-MEDROL) injection 40 mg    Activities of Daily Living In your present state of health, do you have any difficulty performing the following activities: 04/09/2016  In the past six months, have you accidently leaked urine? Y  Do you have problems with loss of bowel control? Y    Patient Care Team: Cassandria Anger, MD as PCP - General (Internal Medicine) Druscilla Brownie, MD as Consulting Physician (Dermatology)   Assessment:     Exercise Activities and Dietary recommendations    Goals    . Weight < 175 lb (79.379 kg)     Drink  more water; Divide nutrients and calories more equally between meals       Fall Risk Fall Risk  01/16/2015  Falls in the past year? No   Depression Screen PHQ 2/9 Scores 01/16/2015  PHQ - 2 Score 0    Cognitive Testing MMSE - Mini Mental State Exam 04/09/2016  Not completed: (No Data)    Immunization History  Administered Date(s) Administered  . H1N1 11/01/2008  . Influenza Split 08/05/2011, 08/04/2012  . Influenza Whole 09/01/2007, 08/23/2008, 08/15/2009, 07/30/2010  . Influenza,inj,Quad PF,36+ Mos 07/28/2013, 08/16/2014, 08/22/2015  . Pneumococcal Conjugate-13 11/02/2013  . Pneumococcal Polysaccharide-23 09/16/2012  . Tdap 10/13/2012   Screening Tests Health Maintenance  Topic Date Due  . ZOSTAVAX  01/09/1982  . INFLUENZA VACCINE  06/18/2016  . TETANUS/TDAP  10/13/2022  . PNA vac Low Risk Adult  Completed      Plan:     n review; We discussed resources for friend Dept of Social Services  [facility] [employer]  Address: [address], [place], [place] [address] Phone: [phone]  NAMI; Radio producer for Mentally [place] [email or web address]  M-F, 10 AM - 6 PM ET  Can access the [facility] for hearing screen and hearing aids  [employer]: [place], [place], [place], [place] [address] Phone: [phone]  [facility] Office: Monday-Friday [address] [place], [place]  [address] [phone]  During the course of the visit the patient was educated and counseled about the following appropriate screening and preventive services:   Vaccines to include Pneumoccal, Influenza, Hepatitis B, Td, Zostavax, HCV  Electrocardiogram  Cardiovascular Disease  Colorectal cancer screening  Diabetes screening  Prostate Cancer Screening  Glaucoma screening  Nutrition counseling   Smoking cessation counseling  Patient Instructions (the written plan) was given to the patient.    Wynetta Fines, RN  04/09/2016   Medical screening examination/treatment/procedure(s) were performed by non-physician practitioner and as supervising physician I was immediately available for consultation/collaboration. I agree with above. Walker Kehr, MD

## 2016-04-12 ENCOUNTER — Encounter: Payer: Self-pay | Admitting: Internal Medicine

## 2016-04-12 ENCOUNTER — Ambulatory Visit (INDEPENDENT_AMBULATORY_CARE_PROVIDER_SITE_OTHER): Payer: Medicare Other | Admitting: Internal Medicine

## 2016-04-12 VITALS — BP 110/60 | HR 54 | Wt 188.0 lb

## 2016-04-12 DIAGNOSIS — L570 Actinic keratosis: Secondary | ICD-10-CM | POA: Diagnosis not present

## 2016-04-12 DIAGNOSIS — R0609 Other forms of dyspnea: Secondary | ICD-10-CM | POA: Diagnosis not present

## 2016-04-12 DIAGNOSIS — E785 Hyperlipidemia, unspecified: Secondary | ICD-10-CM

## 2016-04-12 DIAGNOSIS — F341 Dysthymic disorder: Secondary | ICD-10-CM | POA: Diagnosis not present

## 2016-04-12 DIAGNOSIS — R0600 Dyspnea, unspecified: Secondary | ICD-10-CM

## 2016-04-12 MED ORDER — ALPRAZOLAM 1 MG PO TABS: 1.0000 mg | ORAL_TABLET | Freq: Every day | ORAL | Status: DC

## 2016-04-12 MED ORDER — CLOBETASOL PROPIONATE 0.05 % EX OINT: 1.0000 "application " | TOPICAL_OINTMENT | Freq: Two times a day (BID) | CUTANEOUS | Status: DC

## 2016-04-12 MED ORDER — DICLOFENAC SODIUM 75 MG PO TBEC: 75.0000 mg | DELAYED_RELEASE_TABLET | Freq: Two times a day (BID) | ORAL | Status: DC

## 2016-04-12 NOTE — Progress Notes (Signed)
Subjective:  Patient ID: [name] [name], male    DOB: [birth year]  Age: 82 y.o. MRN: [id number]  CC: No chief complaint on file.   HPI [name] [name] presents for a skin lesion on the face. Pt saw Dr Allyson Sabal - he was advised to use a cream... F/u anxiety, GERD, dyslipidemia  Outpatient Prescriptions Prior to Visit  Medication Sig Dispense Refill  . ALPRAZolam (XANAX) 1 MG tablet Take 1 tablet (1 mg total) by mouth at bedtime. 60 tablet 3  . aspirin EC 81 MG tablet Take 81 mg by mouth daily.    . clotrimazole-betamethasone (LOTRISONE) cream Apply 1 application topically 2 (two) times daily. 15 g 1  . diclofenac (VOLTAREN) 75 MG EC tablet Take 1 tablet (75 mg total) by mouth 2 (two) times daily. 60 tablet 1  . furosemide (LASIX) 20 MG tablet Take 1 tablet (20 mg total) by mouth daily. 90 tablet 3  . gabapentin (NEURONTIN) 100 MG capsule Take 1 capsule (100 mg total) by mouth 3 (three) times daily as needed (neuropathy). 90 capsule 5  . hydrocortisone 2.5 % cream   1  . losartan-hydrochlorothiazide (HYZAAR) 50-12.5 MG per tablet TAKE 1 BY MOUTH DAILY 90 tablet 3  . metoprolol succinate (TOPROL-XL) 50 MG 24 hr tablet Take 1 tablet (50 mg total) by mouth daily. 30 tablet 1  . polyethylene glycol powder (GLYCOLAX/MIRALAX) powder Take 17 g by mouth 2 (two) times daily as needed. 500 g 1  . ranitidine (ZANTAC) 150 MG tablet TAKE 1 TABLET TWICE DAILY. 60 tablet 3  . sildenafil (REVATIO) 20 MG tablet Take 1-3 tablets (20-60 mg total) by mouth daily as needed. 60 tablet 3  . simvastatin (ZOCOR) 40 MG tablet TAKE 1/2 BY MOUTH AT BEDTIME 45 tablet 5  . triamcinolone cream (KENALOG) 0.5 % Apply 1 application topically 4 (four) times daily. 45 g 1   Facility-Administered Medications Prior to Visit  Medication Dose Route Frequency Provider Last Rate Last Dose  . methylPREDNISolone acetate (DEPO-MEDROL) injection 40 mg  40 mg Intra-articular Once Andoni Busch V, MD        ROS Review of Systems    Constitutional: Negative for appetite change, fatigue and unexpected weight change.  HENT: Negative for congestion, nosebleeds, sneezing, sore throat and trouble swallowing.   Eyes: Negative for itching and visual disturbance.  Respiratory: Negative for cough.   Cardiovascular: Negative for chest pain, palpitations and leg swelling.  Gastrointestinal: Negative for nausea, diarrhea, blood in stool and abdominal distention.  Genitourinary: Negative for frequency and hematuria.  Musculoskeletal: Positive for arthralgias and gait problem. Negative for back pain, joint swelling and neck pain.  Skin: Positive for color change. Negative for rash.  Neurological: Negative for dizziness, tremors, speech difficulty and weakness.  Psychiatric/Behavioral: Negative for suicidal ideas, sleep disturbance, dysphoric mood and agitation. The patient is not nervous/anxious.     Objective:  BP 110/60 mmHg  Pulse 54  Wt 188 lb (85.276 kg)  SpO2 95%  BP Readings from Last 3 Encounters:  04/12/16 110/60  04/09/16 138/70  02/16/16 144/84    Wt Readings from Last 3 Encounters:  04/12/16 188 lb (85.276 kg)  04/09/16 188 lb (85.276 kg)  02/16/16 188 lb (85.276 kg)    Physical Exam  Constitutional: He is oriented to person, place, and time. He appears well-developed. No distress.  NAD  HENT:  Mouth/Throat: Oropharynx is clear and moist.  Eyes: Conjunctivae are normal. Pupils are equal, round, and reactive to light.  Neck: Normal  range of motion. No JVD present. No thyromegaly present.  Cardiovascular: Normal rate, regular rhythm, normal heart sounds and intact distal pulses.  Exam reveals no gallop and no friction rub.   No murmur heard. Pulmonary/Chest: Effort normal and breath sounds normal. No respiratory distress. He has no wheezes. He has no rales. He exhibits no tenderness.  Abdominal: Soft. Bowel sounds are normal. He exhibits no distension and no mass. There is no tenderness. There is no  rebound and no guarding.  Musculoskeletal: Normal range of motion. He exhibits tenderness. He exhibits no edema.  Lymphadenopathy:    He has no cervical adenopathy.  Neurological: He is alert and oriented to person, place, and time. He has normal reflexes. No cranial nerve deficit. He exhibits normal muscle tone. He displays a negative Romberg sign. Coordination and gait normal.  Skin: Skin is warm and dry. No rash noted.  Psychiatric: He has a normal mood and affect. His behavior is normal. Judgment and thought content normal.  AKs on face and scalp    Procedure Note :     Procedure : Cryosurgery   Indication:   Actinic keratosis(es)   Risks including unsuccessful procedure , bleeding, infection, bruising, scar, a need for a repeat  procedure and others were explained to the patient in detail as well as the benefits. Informed consent was obtained verbally.   6  lesion(s)  on face and scalp   was/were treated with liquid nitrogen on a Q-tip in a usual fasion . Band-Aid was applied and antibiotic ointment was given for a later use.   Tolerated well. Complications none.   Postprocedure instructions :     Keep the wounds clean. You can wash them with liquid soap and water. Pat dry with gauze or a Kleenex tissue  Before applying antibiotic ointment and a Band-Aid.   You need to report immediately  if  any signs of infection develop.    Lab Results  Component Value Date   WBC 6.3 12/14/2015   HGB 15.4 12/14/2015   HCT 46.2 12/14/2015   PLT 149.0* 12/14/2015   GLUCOSE 86 02/16/2016   CHOL 110 05/25/2013   TRIG 83.0 05/25/2013   HDL 39.00* 05/25/2013   LDLCALC 54 05/25/2013   ALT 10 12/14/2015   AST 17 12/14/2015   NA 140 02/16/2016   K 4.9 02/16/2016   CL 106 02/16/2016   CREATININE 1.69* 02/16/2016   BUN 50* 02/16/2016   CO2 28 02/16/2016   TSH 2.42 05/23/2015   PSA 0.24 03/08/2014    Dg Abd 2 Views  10/05/2015  CLINICAL DATA:  Generalized abdominal pain for 7 days.  Mild constipation. EXAM: ABDOMEN - 2 VIEW COMPARISON:  None. FINDINGS: There is a large amount of stool throughout the colon. There is no bowel dilatation to suggest obstruction. There is no evidence of pneumoperitoneum, portal venous gas or pneumatosis. There are no pathologic calcifications along the expected course of the ureters. There is lumbar spine spondylosis. There is blunting of the costophrenic angles bilaterally which may reflect fibrosis versus trace pleural effusions. IMPRESSION: Large amount of stool throughout the colon. Electronically Signed   By: Kathreen Devoid   On: 10/05/2015 16:05    Assessment & Plan:   There are no diagnoses linked to this encounter. I am having Mr. Pullium maintain his aspirin EC, triamcinolone cream, clotrimazole-betamethasone, simvastatin, furosemide, metoprolol succinate, losartan-hydrochlorothiazide, hydrocortisone, gabapentin, polyethylene glycol powder, ranitidine, ALPRAZolam, diclofenac, sildenafil, and metroNIDAZOLE. We will continue to administer methylPREDNISolone acetate.  Meds  ordered this encounter  Medications  . metroNIDAZOLE (METROCREAM) 0.75 % cream    Sig: as needed.    Refill:  1     Follow-up: No Follow-up on file.  Walker Kehr, MD

## 2016-04-12 NOTE — Patient Instructions (Signed)
   Postprocedure instructions :     Keep the wounds clean. You can wash them with liquid soap and water. Pat dry with gauze or a Kleenex tissue  Before applying antibiotic ointment and a Band-Aid.   You need to report immediately  if  any signs of infection develop.    

## 2016-04-12 NOTE — Assessment & Plan Note (Signed)
Chronic - not on meds Xanax prn  Potential benefits of a long term benzodiazepines  use as well as potential risks  and complications were explained to the patient and were aknowledged.

## 2016-04-12 NOTE — Progress Notes (Signed)
Pre visit review using our clinic review tool, if applicable. No additional management support is needed unless otherwise documented below in the visit note. 

## 2016-04-12 NOTE — Assessment & Plan Note (Signed)
On Simvastatin 

## 2016-04-12 NOTE — Assessment & Plan Note (Signed)
Doing well 

## 2016-04-12 NOTE — Assessment & Plan Note (Signed)
scalp, face Will do a cryo

## 2016-04-30 DIAGNOSIS — L309 Dermatitis, unspecified: Secondary | ICD-10-CM | POA: Diagnosis not present

## 2016-05-15 ENCOUNTER — Other Ambulatory Visit: Payer: Self-pay

## 2016-05-15 MED ORDER — METOPROLOL SUCCINATE ER 50 MG PO TB24: 50.0000 mg | ORAL_TABLET | Freq: Every day | ORAL | Status: DC

## 2016-05-17 ENCOUNTER — Encounter: Payer: Self-pay | Admitting: Internal Medicine

## 2016-05-17 ENCOUNTER — Ambulatory Visit (INDEPENDENT_AMBULATORY_CARE_PROVIDER_SITE_OTHER): Payer: Medicare Other | Admitting: Internal Medicine

## 2016-05-17 VITALS — BP 120/78 | HR 51 | Wt 189.0 lb

## 2016-05-17 DIAGNOSIS — R0609 Other forms of dyspnea: Secondary | ICD-10-CM

## 2016-05-17 DIAGNOSIS — I10 Essential (primary) hypertension: Secondary | ICD-10-CM

## 2016-05-17 DIAGNOSIS — K5901 Slow transit constipation: Secondary | ICD-10-CM

## 2016-05-17 DIAGNOSIS — F341 Dysthymic disorder: Secondary | ICD-10-CM

## 2016-05-17 DIAGNOSIS — R0600 Dyspnea, unspecified: Secondary | ICD-10-CM

## 2016-05-17 DIAGNOSIS — I5022 Chronic systolic (congestive) heart failure: Secondary | ICD-10-CM

## 2016-05-17 MED ORDER — LINACLOTIDE 145 MCG PO CAPS: 145.0000 ug | ORAL_CAPSULE | Freq: Every day | ORAL | Status: DC | PRN

## 2016-05-17 NOTE — Assessment & Plan Note (Signed)
chronic

## 2016-05-17 NOTE — Progress Notes (Signed)
Subjective:  Patient ID: [name] [name], male    DOB: Oct 26, 1920  Age: 82 y.o. MRN: [id number]  CC: No chief complaint on file.   HPI Olindo Swarbrick presents for AK - doing well on the nose; OA,CHF, HTN, anxiety f/u. C/o constipation  Outpatient Prescriptions Prior to Visit  Medication Sig Dispense Refill  . ALPRAZolam (XANAX) 1 MG tablet Take 1 tablet (1 mg total) by mouth at bedtime. 60 tablet 3  . aspirin EC 81 MG tablet Take 81 mg by mouth daily.    . clobetasol ointment (TEMOVATE) [id number] % Apply 1 application topically 2 (two) times daily. 30 g 2  . clotrimazole-betamethasone (LOTRISONE) cream Apply 1 application topically 2 (two) times daily. 15 g 1  . diclofenac (VOLTAREN) 75 MG EC tablet Take 1 tablet (75 mg total) by mouth 2 (two) times daily. 60 tablet 1  . furosemide (LASIX) 20 MG tablet Take 1 tablet (20 mg total) by mouth daily. 90 tablet 3  . gabapentin (NEURONTIN) 100 MG capsule Take 1 capsule (100 mg total) by mouth 3 (three) times daily as needed (neuropathy). 90 capsule 5  . losartan-hydrochlorothiazide (HYZAAR) 50-12.5 MG per tablet TAKE 1 BY MOUTH DAILY 90 tablet 3  . metoprolol succinate (TOPROL-XL) 50 MG 24 hr tablet Take 1 tablet (50 mg total) by mouth daily. 90 tablet 0  . metroNIDAZOLE (METROCREAM) 0.75 % cream as needed.  1  . polyethylene glycol powder (GLYCOLAX/MIRALAX) powder Take 17 g by mouth 2 (two) times daily as needed. 500 g 1  . ranitidine (ZANTAC) 150 MG tablet TAKE 1 TABLET TWICE DAILY. 60 tablet 3  . sildenafil (REVATIO) 20 MG tablet Take 1-3 tablets (20-60 mg total) by mouth daily as needed. 60 tablet 3  . simvastatin (ZOCOR) 40 MG tablet TAKE 1/2 BY MOUTH AT BEDTIME 45 tablet 5  . triamcinolone cream (KENALOG) 0.5 % Apply 1 application topically 4 (four) times daily. 45 g 1   Facility-Administered Medications Prior to Visit  Medication Dose Route Frequency Provider Last Rate Last Dose  . methylPREDNISolone acetate (DEPO-MEDROL) injection 40 mg  40 mg  Intra-articular Once Cassandria Anger, MD        ROS Review of Systems  Constitutional: Negative for appetite change, fatigue and unexpected weight change.  HENT: Negative for congestion, nosebleeds, sneezing, sore throat and trouble swallowing.   Eyes: Negative for itching and visual disturbance.  Respiratory: Negative for cough.   Cardiovascular: Negative for chest pain, palpitations and leg swelling.  Gastrointestinal: Negative for nausea, diarrhea, blood in stool and abdominal distention.  Genitourinary: Negative for frequency and hematuria.  Musculoskeletal: Positive for arthralgias. Negative for back pain, joint swelling, gait problem and neck pain.  Skin: Negative for rash.  Neurological: Negative for dizziness, tremors, speech difficulty and weakness.  Psychiatric/Behavioral: Negative for suicidal ideas, sleep disturbance, dysphoric mood and agitation. The patient is not nervous/anxious.     Objective:  BP 120/78 mmHg  Pulse 51  Wt 189 lb (85.73 kg)  SpO2 95%  BP Readings from Last 3 Encounters:  05/17/16 120/78  04/12/16 110/60  04/09/16 138/70    Wt Readings from Last 3 Encounters:  05/17/16 189 lb (85.73 kg)  04/12/16 188 lb (85.276 kg)  04/09/16 188 lb (85.276 kg)    Physical Exam  Constitutional: He is oriented to person, place, and time. He appears well-developed. No distress.  NAD  HENT:  Mouth/Throat: Oropharynx is clear and moist.  Eyes: Conjunctivae are normal. Pupils are equal, round, and reactive  to light.  Neck: Normal range of motion. No JVD present. No thyromegaly present.  Cardiovascular: Normal rate, regular rhythm, normal heart sounds and intact distal pulses.  Exam reveals no gallop and no friction rub.   No murmur heard. Pulmonary/Chest: Effort normal and breath sounds normal. No respiratory distress. He has no wheezes. He has no rales. He exhibits no tenderness.  Abdominal: Soft. Bowel sounds are normal. He exhibits no distension and no  mass. There is no tenderness. There is no rebound and no guarding.  Musculoskeletal: Normal range of motion. He exhibits tenderness. He exhibits no edema.  Lymphadenopathy:    He has no cervical adenopathy.  Neurological: He is alert and oriented to person, place, and time. He has normal reflexes. No cranial nerve deficit. He exhibits normal muscle tone. He displays a negative Romberg sign. Coordination abnormal. Gait normal.  Skin: Skin is warm and dry. No rash noted.  Psychiatric: He has a normal mood and affect. His behavior is normal. Judgment and thought content normal.  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 Mild constipation. EXAM: ABDOMEN - 2 VIEW COMPARISON:  None. FINDINGS: There is a large amount of stool throughout the colon. There is no bowel dilatation to suggest obstruction. There is no evidence of pneumoperitoneum, portal venous gas or pneumatosis. There are no pathologic calcifications along the expected course of the ureters. There is lumbar spine spondylosis. There is blunting of the costophrenic angles bilaterally which may reflect fibrosis versus trace pleural effusions. IMPRESSION: Large amount of stool throughout the colon. Electronically Signed   By: Kathreen Devoid   On: 10/05/2015 16:05    Assessment & Plan:   There are no diagnoses linked to this encounter. I am having Mr. Wolaver maintain his aspirin EC, triamcinolone  cream, clotrimazole-betamethasone, simvastatin, furosemide, losartan-hydrochlorothiazide, gabapentin, polyethylene glycol powder, ranitidine, sildenafil, metroNIDAZOLE, ALPRAZolam, diclofenac, clobetasol ointment, and metoprolol succinate. We will continue to administer methylPREDNISolone acetate.  No orders of the defined types were placed in this encounter.     Follow-up: No Follow-up on file.  Walker Kehr, MD

## 2016-05-17 NOTE — Assessment & Plan Note (Signed)
Xanax prn  Potential benefits of a long term benzodiazepines  use as well as potential risks  and complications were explained to the patient and were aknowledged. 

## 2016-05-17 NOTE — Assessment & Plan Note (Signed)
On Toprol, Furosemide, Hyzaar

## 2016-05-17 NOTE — Assessment & Plan Note (Signed)
Hyzaar, Furosemide, Toprol

## 2016-05-17 NOTE — Assessment & Plan Note (Signed)
Worse, try Linzess

## 2016-05-17 NOTE — Progress Notes (Signed)
Pre visit review using our clinic review tool, if applicable. No additional management support is needed unless otherwise documented below in the visit note. 

## 2016-05-24 DIAGNOSIS — N528 Other male erectile dysfunction: Secondary | ICD-10-CM | POA: Diagnosis not present

## 2016-05-24 DIAGNOSIS — R351 Nocturia: Secondary | ICD-10-CM | POA: Diagnosis not present

## 2016-05-24 DIAGNOSIS — N401 Enlarged prostate with lower urinary tract symptoms: Secondary | ICD-10-CM | POA: Diagnosis not present

## 2016-05-24 DIAGNOSIS — R3915 Urgency of urination: Secondary | ICD-10-CM | POA: Diagnosis not present

## 2016-05-28 ENCOUNTER — Encounter (HOSPITAL_COMMUNITY): Payer: Self-pay | Admitting: Emergency Medicine

## 2016-05-28 ENCOUNTER — Ambulatory Visit (INDEPENDENT_AMBULATORY_CARE_PROVIDER_SITE_OTHER): Payer: Medicare Other | Admitting: Internal Medicine

## 2016-05-28 ENCOUNTER — Inpatient Hospital Stay (HOSPITAL_COMMUNITY)
Admission: EM | Admit: 2016-05-28 | Discharge: 2016-06-05 | DRG: 988 | Disposition: A | Discharge status: Home / Self Care | Payer: Medicare Other | Attending: Internal Medicine | Admitting: Internal Medicine

## 2016-05-28 ENCOUNTER — Emergency Department (HOSPITAL_COMMUNITY): Payer: Medicare Other

## 2016-05-28 DIAGNOSIS — I248 Other forms of acute ischemic heart disease: Secondary | ICD-10-CM | POA: Diagnosis present

## 2016-05-28 DIAGNOSIS — I10 Essential (primary) hypertension: Secondary | ICD-10-CM | POA: Diagnosis not present

## 2016-05-28 DIAGNOSIS — T07XXXA Unspecified multiple injuries, initial encounter: Secondary | ICD-10-CM | POA: Diagnosis present

## 2016-05-28 DIAGNOSIS — I5022 Chronic systolic (congestive) heart failure: Secondary | ICD-10-CM | POA: Diagnosis not present

## 2016-05-28 DIAGNOSIS — H269 Unspecified cataract: Secondary | ICD-10-CM | POA: Diagnosis present

## 2016-05-28 DIAGNOSIS — R000 Tachycardia, unspecified: Secondary | ICD-10-CM | POA: Diagnosis present

## 2016-05-28 DIAGNOSIS — F341 Dysthymic disorder: Secondary | ICD-10-CM | POA: Diagnosis present

## 2016-05-28 DIAGNOSIS — R55 Syncope and collapse: Secondary | ICD-10-CM

## 2016-05-28 DIAGNOSIS — Z86010 Personal history of colonic polyps: Secondary | ICD-10-CM | POA: Diagnosis not present

## 2016-05-28 DIAGNOSIS — I4510 Unspecified right bundle-branch block: Secondary | ICD-10-CM | POA: Diagnosis present

## 2016-05-28 DIAGNOSIS — F329 Major depressive disorder, single episode, unspecified: Secondary | ICD-10-CM | POA: Diagnosis present

## 2016-05-28 DIAGNOSIS — I358 Other nonrheumatic aortic valve disorders: Secondary | ICD-10-CM | POA: Diagnosis present

## 2016-05-28 DIAGNOSIS — R7989 Other specified abnormal findings of blood chemistry: Secondary | ICD-10-CM | POA: Diagnosis not present

## 2016-05-28 DIAGNOSIS — Z7982 Long term (current) use of aspirin: Secondary | ICD-10-CM

## 2016-05-28 DIAGNOSIS — I2489 Other forms of acute ischemic heart disease: Secondary | ICD-10-CM | POA: Diagnosis present

## 2016-05-28 DIAGNOSIS — W19XXXA Unspecified fall, initial encounter: Secondary | ICD-10-CM

## 2016-05-28 DIAGNOSIS — S0101XA Laceration without foreign body of scalp, initial encounter: Secondary | ICD-10-CM | POA: Diagnosis present

## 2016-05-28 DIAGNOSIS — T148 Other injury of unspecified body region: Secondary | ICD-10-CM | POA: Diagnosis not present

## 2016-05-28 DIAGNOSIS — S0093XA Contusion of unspecified part of head, initial encounter: Secondary | ICD-10-CM | POA: Insufficient documentation

## 2016-05-28 DIAGNOSIS — I130 Hypertensive heart and chronic kidney disease with heart failure and stage 1 through stage 4 chronic kidney disease, or unspecified chronic kidney disease: Secondary | ICD-10-CM | POA: Diagnosis present

## 2016-05-28 DIAGNOSIS — I484 Atypical atrial flutter: Secondary | ICD-10-CM | POA: Diagnosis present

## 2016-05-28 DIAGNOSIS — I2510 Atherosclerotic heart disease of native coronary artery without angina pectoris: Secondary | ICD-10-CM | POA: Diagnosis present

## 2016-05-28 DIAGNOSIS — Z7901 Long term (current) use of anticoagulants: Secondary | ICD-10-CM

## 2016-05-28 DIAGNOSIS — R220 Localized swelling, mass and lump, head: Secondary | ICD-10-CM | POA: Diagnosis not present

## 2016-05-28 DIAGNOSIS — H547 Unspecified visual loss: Secondary | ICD-10-CM | POA: Diagnosis present

## 2016-05-28 DIAGNOSIS — E875 Hyperkalemia: Secondary | ICD-10-CM | POA: Diagnosis present

## 2016-05-28 DIAGNOSIS — N529 Male erectile dysfunction, unspecified: Secondary | ICD-10-CM | POA: Diagnosis present

## 2016-05-28 DIAGNOSIS — S5011XA Contusion of right forearm, initial encounter: Secondary | ICD-10-CM | POA: Diagnosis not present

## 2016-05-28 DIAGNOSIS — I495 Sick sinus syndrome: Secondary | ICD-10-CM | POA: Diagnosis not present

## 2016-05-28 DIAGNOSIS — S0990XA Unspecified injury of head, initial encounter: Secondary | ICD-10-CM | POA: Diagnosis not present

## 2016-05-28 DIAGNOSIS — I420 Dilated cardiomyopathy: Secondary | ICD-10-CM | POA: Insufficient documentation

## 2016-05-28 DIAGNOSIS — F418 Other specified anxiety disorders: Secondary | ICD-10-CM | POA: Diagnosis present

## 2016-05-28 DIAGNOSIS — N183 Chronic kidney disease, stage 3 unspecified: Secondary | ICD-10-CM | POA: Diagnosis present

## 2016-05-28 DIAGNOSIS — R778 Other specified abnormalities of plasma proteins: Secondary | ICD-10-CM | POA: Insufficient documentation

## 2016-05-28 DIAGNOSIS — S0190XA Unspecified open wound of unspecified part of head, initial encounter: Secondary | ICD-10-CM | POA: Diagnosis not present

## 2016-05-28 DIAGNOSIS — I480 Paroxysmal atrial fibrillation: Principal | ICD-10-CM | POA: Diagnosis present

## 2016-05-28 DIAGNOSIS — E785 Hyperlipidemia, unspecified: Secondary | ICD-10-CM | POA: Diagnosis present

## 2016-05-28 DIAGNOSIS — D696 Thrombocytopenia, unspecified: Secondary | ICD-10-CM | POA: Diagnosis not present

## 2016-05-28 DIAGNOSIS — Z66 Do not resuscitate: Secondary | ICD-10-CM | POA: Diagnosis present

## 2016-05-28 DIAGNOSIS — S40021A Contusion of right upper arm, initial encounter: Secondary | ICD-10-CM | POA: Diagnosis not present

## 2016-05-28 DIAGNOSIS — N400 Enlarged prostate without lower urinary tract symptoms: Secondary | ICD-10-CM | POA: Diagnosis not present

## 2016-05-28 DIAGNOSIS — R001 Bradycardia, unspecified: Secondary | ICD-10-CM | POA: Diagnosis not present

## 2016-05-28 DIAGNOSIS — R296 Repeated falls: Secondary | ICD-10-CM | POA: Insufficient documentation

## 2016-05-28 DIAGNOSIS — I4819 Other persistent atrial fibrillation: Secondary | ICD-10-CM | POA: Diagnosis present

## 2016-05-28 DIAGNOSIS — Z8679 Personal history of other diseases of the circulatory system: Secondary | ICD-10-CM | POA: Insufficient documentation

## 2016-05-28 DIAGNOSIS — M25519 Pain in unspecified shoulder: Secondary | ICD-10-CM

## 2016-05-28 DIAGNOSIS — Z23 Encounter for immunization: Secondary | ICD-10-CM | POA: Diagnosis not present

## 2016-05-28 DIAGNOSIS — I481 Persistent atrial fibrillation: Secondary | ICD-10-CM | POA: Diagnosis present

## 2016-05-28 DIAGNOSIS — I340 Nonrheumatic mitral (valve) insufficiency: Secondary | ICD-10-CM | POA: Diagnosis not present

## 2016-05-28 DIAGNOSIS — Z79899 Other long term (current) drug therapy: Secondary | ICD-10-CM

## 2016-05-28 DIAGNOSIS — S098XXA Other specified injuries of head, initial encounter: Secondary | ICD-10-CM | POA: Diagnosis not present

## 2016-05-28 HISTORY — DX: Major depressive disorder, single episode, unspecified: F32.9

## 2016-05-28 HISTORY — DX: Unspecified osteoarthritis, unspecified site: M19.90

## 2016-05-28 HISTORY — DX: Depression, unspecified: F32.A

## 2016-05-28 HISTORY — DX: Anxiety disorder, unspecified: F41.9

## 2016-05-28 HISTORY — DX: Conduction disorder, unspecified: I45.9

## 2016-05-28 LAB — URINALYSIS, ROUTINE W REFLEX MICROSCOPIC
BILIRUBIN URINE: NEGATIVE
GLUCOSE, UA: NEGATIVE mg/dL
HGB URINE DIPSTICK: NEGATIVE
KETONES UR: NEGATIVE mg/dL
Leukocytes, UA: NEGATIVE
Nitrite: NEGATIVE
PH: 5.5 (ref 5.0–8.0)
PROTEIN: NEGATIVE mg/dL
Specific Gravity, Urine: 1.014 (ref 1.005–1.030)

## 2016-05-28 LAB — BASIC METABOLIC PANEL
ANION GAP: 8 (ref 5–15)
BUN: 38 mg/dL — ABNORMAL HIGH (ref 6–20)
CALCIUM: 8.4 mg/dL — AB (ref 8.9–10.3)
CO2: 22 mmol/L (ref 22–32)
CREATININE: 1.7 mg/dL — AB (ref 0.61–1.24)
Chloride: 106 mmol/L (ref 101–111)
GFR, EST AFRICAN AMERICAN: 38 mL/min — AB (ref >60)
GFR, EST NON AFRICAN AMERICAN: 33 mL/min — AB (ref >60)
GLUCOSE: 95 mg/dL (ref 65–99)
Potassium: 5.6 mmol/L — ABNORMAL HIGH (ref 3.5–5.1)
Sodium: 136 mmol/L (ref 135–145)

## 2016-05-28 LAB — CBC
HEMATOCRIT: 46.5 % (ref 39.0–52.0)
HEMOGLOBIN: 15.2 g/dL (ref 13.0–17.0)
MCH: 31.1 pg (ref 26.0–34.0)
MCHC: 32.7 g/dL (ref 30.0–36.0)
MCV: 95.3 fL (ref 78.0–100.0)
PLATELETS: 125 10*3/uL — AB (ref 150–400)
RBC: 4.88 MIL/uL (ref 4.22–5.81)
RDW: 13.4 % (ref 11.5–15.5)
WBC: 7.9 10*3/uL (ref 4.0–10.5)

## 2016-05-28 LAB — TROPONIN I
TROPONIN I: 0.06 ng/mL — AB (ref <0.03)
Troponin I: 0.07 ng/mL (ref <0.03)

## 2016-05-28 MED ORDER — ENOXAPARIN SODIUM 40 MG/0.4ML ~~LOC~~ SOLN: 40.0000 mg | SUBCUTANEOUS | Status: DC

## 2016-05-28 MED ORDER — METOPROLOL SUCCINATE ER 50 MG PO TB24
50.0000 mg | ORAL_TABLET | Freq: Every day | ORAL | Status: DC
  Administered 2016-05-28 – 2016-05-29 (×2): 50 mg via ORAL
  Filled 2016-05-28 (×3): qty 1

## 2016-05-28 MED ORDER — ASPIRIN EC 81 MG PO TBEC
81.0000 mg | DELAYED_RELEASE_TABLET | Freq: Every day | ORAL | Status: DC
  Administered 2016-05-29: 81 mg via ORAL
  Filled 2016-05-28: qty 1

## 2016-05-28 MED ORDER — ACETAMINOPHEN 325 MG PO TABS: 650.0000 mg | ORAL_TABLET | Freq: Four times a day (QID) | ORAL | Status: DC | PRN

## 2016-05-28 MED ORDER — LIDOCAINE HCL (PF) 1 % IJ SOLN
5.0000 mL | Freq: Once | INTRAMUSCULAR | Status: AC
  Administered 2016-05-28: 5 mL via INTRADERMAL
  Filled 2016-05-28: qty 5

## 2016-05-28 MED ORDER — SIMVASTATIN 40 MG PO TABS: 40.0000 mg | ORAL_TABLET | Freq: Every day | ORAL | Status: DC

## 2016-05-28 MED ORDER — SODIUM CHLORIDE 0.9 % IV SOLN
INTRAVENOUS | Status: DC
  Administered 2016-05-28: 20 mL/h via INTRAVENOUS

## 2016-05-28 MED ORDER — GABAPENTIN 100 MG PO CAPS: 100.0000 mg | ORAL_CAPSULE | Freq: Three times a day (TID) | ORAL | Status: DC | PRN

## 2016-05-28 MED ORDER — OXYCODONE-ACETAMINOPHEN 5-325 MG PO TABS
1.0000 | ORAL_TABLET | Freq: Once | ORAL | Status: AC
Start: 2016-05-28 — End: 2016-05-28
  Administered 2016-05-28: 1 via ORAL
  Filled 2016-05-28: qty 1

## 2016-05-28 MED ORDER — SODIUM CHLORIDE 0.9% FLUSH
3.0000 mL | Freq: Two times a day (BID) | INTRAVENOUS | Status: DC
Start: 2016-05-28 — End: 2016-06-05
  Administered 2016-05-28 – 2016-06-05 (×12): 3 mL via INTRAVENOUS

## 2016-05-28 MED ORDER — ALPRAZOLAM 0.5 MG PO TABS
1.0000 mg | ORAL_TABLET | Freq: Every day | ORAL | Status: DC
  Administered 2016-05-28 – 2016-06-04 (×8): 1 mg via ORAL
  Filled 2016-05-28 (×8): qty 2

## 2016-05-28 MED ORDER — TETANUS-DIPHTH-ACELL PERTUSSIS 5-2.5-18.5 LF-MCG/0.5 IM SUSP
0.5000 mL | Freq: Once | INTRAMUSCULAR | Status: AC
  Administered 2016-05-28: 0.5 mL via INTRAMUSCULAR
  Filled 2016-05-28: qty 0.5

## 2016-05-28 MED ORDER — OXYCODONE HCL 5 MG PO TABS
5.0000 mg | ORAL_TABLET | ORAL | Status: DC | PRN
  Administered 2016-05-29: 5 mg via ORAL
  Filled 2016-05-28: qty 1

## 2016-05-28 MED ORDER — FAMOTIDINE 20 MG PO TABS
10.0000 mg | ORAL_TABLET | Freq: Two times a day (BID) | ORAL | Status: DC
  Administered 2016-05-28 – 2016-06-05 (×15): 10 mg via ORAL
  Filled 2016-05-28 (×16): qty 1

## 2016-05-28 MED ORDER — ACETAMINOPHEN 650 MG RE SUPP: 650.0000 mg | Freq: Four times a day (QID) | RECTAL | Status: DC | PRN

## 2016-05-28 MED ORDER — ENOXAPARIN SODIUM 30 MG/0.3ML ~~LOC~~ SOLN
30.0000 mg | SUBCUTANEOUS | Status: DC
  Administered 2016-05-28: 30 mg via SUBCUTANEOUS
  Filled 2016-05-28: qty 0.3

## 2016-05-28 NOTE — Progress Notes (Signed)
Subjective:    Patient ID: [name] [name], male    DOB: [birth year], 82 y.o.   MRN: [id number]  HPI  Pt seen as urgent walkin to office this am, hoping to avoid the ER, pt of Dr Jacalyn Lefevre, former [employer]  Pt drove himself here after a fall , after almost finishing mowing the whole front yard with pushmower.  Has been some dizzy in past few days, but pushed himself to try to get the yard done.  Fell while pushing to concrete, somehow striking the top of head on concrete.  Had LOC for ? 1 min or less, then came to, was able to get up after a minute.  Noticed much blood from scalp wound, and lesser from a right mid post arm contusion/small laceration.   Past Medical History  Diagnosis Date  . Hypertension   . Hyperlipidemia   . Coronary artery disease   . Cardiomyopathy   . Hemorrhoids    Past Surgical History  Procedure Laterality Date  . Tonsilectomy, adenoidectomy, bilateral myringotomy and tubes    . Colonic polyps    . Cholecystectomy    . Inguinal hernia repair    . Appendectomy      reports that he has never smoked. He has never used smokeless tobacco. He reports that he does not drink alcohol or use illicit drugs. family history is not on file. Allergies  Allergen Reactions  . Lisinopril     cough   Current Outpatient Prescriptions on File Prior to Visit  Medication Sig Dispense Refill  . ALPRAZolam (XANAX) 1 MG tablet Take 1 tablet (1 mg total) by mouth at bedtime. 60 tablet 3  . aspirin EC 81 MG tablet Take 81 mg by mouth daily.    . clobetasol ointment (TEMOVATE) AB-123456789 % Apply 1 application topically 2 (two) times daily. 30 g 2  . diclofenac (VOLTAREN) 75 MG EC tablet Take 1 tablet (75 mg total) by mouth 2 (two) times daily. 60 tablet 1  . furosemide (LASIX) 20 MG tablet Take 1 tablet (20 mg total) by mouth daily. 90 tablet 3  . gabapentin (NEURONTIN) 100 MG capsule Take 1 capsule (100 mg total) by mouth 3 (three) times daily as needed (neuropathy). 90 capsule 5  .  linaclotide (LINZESS) 145 MCG CAPS capsule Take 1-2 capsules (145-290 mcg total) by mouth daily as needed. 60 capsule 5  . losartan-hydrochlorothiazide (HYZAAR) 50-12.5 MG per tablet TAKE 1 BY MOUTH DAILY 90 tablet 3  . metoprolol succinate (TOPROL-XL) 50 MG 24 hr tablet Take 1 tablet (50 mg total) by mouth daily. 90 tablet 0  . metroNIDAZOLE (METROCREAM) 0.75 % cream as needed.  1  . polyethylene glycol powder (GLYCOLAX/MIRALAX) powder Take 17 g by mouth 2 (two) times daily as needed. 500 g 1  . ranitidine (ZANTAC) 150 MG tablet TAKE 1 TABLET TWICE DAILY. 60 tablet 3  . sildenafil (REVATIO) 20 MG tablet Take 1-3 tablets (20-60 mg total) by mouth daily as needed. 60 tablet 3  . simvastatin (ZOCOR) 40 MG tablet TAKE 1/2 BY MOUTH AT BEDTIME 45 tablet 5   Current Facility-Administered Medications on File Prior to Visit  Medication Dose Route Frequency Provider Last Rate Last Dose  . methylPREDNISolone acetate (DEPO-MEDROL) injection 40 mg  40 mg Intra-articular Once Cassandria Anger, MD       Review of Systems Not done in detail     Objective:   Physical Exam VS - 144/90, HR 39 VS noted, A &O x  3, moves all 4 's well, follow commands Constitutional: Pt appears in no apparent distress HENT: Head: NCAT.  Right Ear: External ear normal.  Left Ear: External ear normal.  Eyes: . Pupils are equal, round, and reactive to light. Conjunctivae and EOM are normal Neck: Normal range of motion. Neck supple.  Cardiovascular: Normal rate and regular rhythm.   Pulmonary/Chest: Effort normal and breath sounds without rales or wheezing.  Abd:  Soft, NT, ND, + BS - brief exam while sitting Neurological: Pt is alert. Not confused , motor grossly intact Skin: Skin is warm. No rash, no LE edema; + scalp bleeding and laceration, as well as right mid arm contusion Psychiatric: Pt behavior is normal. No agitation.     Assessment & Plan:

## 2016-05-28 NOTE — Assessment & Plan Note (Signed)
With persistent bleeding, likely to need stitches, will need to go to ER

## 2016-05-28 NOTE — ED Notes (Signed)
Heart healthy tray ordered for patient.  

## 2016-05-28 NOTE — Assessment & Plan Note (Signed)
Inappropriate for situation, not on av nodal drugs, also will need further eval in ED, suspect will need admit for further

## 2016-05-28 NOTE — ED Provider Notes (Signed)
CSN: [id number]     Arrival date & time 05/28/16  1048 History   First MD Initiated Contact with Patient 05/28/16 1055     Chief Complaint  Patient presents with  . Loss of Consciousness     (Consider location/radiation/quality/duration/timing/severity/associated sxs/prior Treatment) Patient is a 82 y.o. male presenting with syncope. The history is provided by the patient.  Loss of Consciousness Associated symptoms: no chest pain, no confusion, no fever, no palpitations, no shortness of breath, no vomiting and no weakness   Patient w syncopal event, loc earlier this AM.  Was mowing yard, felt faint, next thing he remembers is waking up on ground with contusion to scalp/blood to area. Drove to doctors office, who sent here. Also felt faint yesterday, but no loc.  No viagra/nitrate use prior to syncope. No palpitations. No chest pain or discomfort. Unsure of last tetanus. Abrasion to right arm. Denies neck or back pain. No abd pain or nv. Denies other pain or injury.  Denies recent blood loss or melena. No recent change in meds.       Past Medical History  Diagnosis Date  . Hypertension   . Hyperlipidemia   . Coronary artery disease   . Cardiomyopathy   . Hemorrhoids    Past Surgical History  Procedure Laterality Date  . Tonsilectomy, adenoidectomy, bilateral myringotomy and tubes    . Colonic polyps    . Cholecystectomy    . Inguinal hernia repair    . Appendectomy     History reviewed. No pertinent family history. Social History  Substance Use Topics  . Smoking status: Never Smoker   . Smokeless tobacco: Never Used  . Alcohol Use: No    Review of Systems  Constitutional: Negative for fever and chills.  HENT: Negative for nosebleeds.   Eyes: Negative for visual disturbance.  Respiratory: Negative for cough and shortness of breath.   Cardiovascular: Positive for syncope. Negative for chest pain, palpitations and leg swelling.  Gastrointestinal: Negative for vomiting,  abdominal pain, diarrhea and blood in stool.  Genitourinary: Negative for dysuria and flank pain.  Musculoskeletal: Negative for back pain and neck pain.  Skin: Positive for wound.  Neurological: Negative for weakness and numbness.  Hematological: Does not bruise/bleed easily.  Psychiatric/Behavioral: Negative for confusion.      Allergies  Lisinopril  Home Medications   Prior to Admission medications   Medication Sig Start Date End Date Taking? Authorizing Provider  ALPRAZolam Duanne Moron) 1 MG tablet Take 1 tablet (1 mg total) by mouth at bedtime. 04/12/16   Evie Lacks Plotnikov, MD  aspirin EC 81 MG tablet Take 81 mg by mouth daily.    Historical Provider, MD  clobetasol ointment (TEMOVATE) AB-123456789 % Apply 1 application topically 2 (two) times daily. 04/12/16   Evie Lacks Plotnikov, MD  diclofenac (VOLTAREN) 75 MG EC tablet Take 1 tablet (75 mg total) by mouth 2 (two) times daily. 04/12/16   Evie Lacks Plotnikov, MD  furosemide (LASIX) 20 MG tablet Take 1 tablet (20 mg total) by mouth daily. 05/23/15   Evie Lacks Plotnikov, MD  gabapentin (NEURONTIN) 100 MG capsule Take 1 capsule (100 mg total) by mouth 3 (three) times daily as needed (neuropathy). 08/22/15   Cassandria Anger, MD  linaclotide (LINZESS) 145 MCG CAPS capsule Take 1-2 capsules (145-290 mcg total) by mouth daily as needed. 05/17/16   Cassandria Anger, MD  losartan-hydrochlorothiazide (HYZAAR) 50-12.5 MG per tablet TAKE 1 BY MOUTH DAILY 06/30/15   Minus Breeding, MD  metoprolol succinate (TOPROL-XL) 50 MG 24 hr tablet Take 1 tablet (50 mg total) by mouth daily. 05/15/16   Minus Breeding, MD  metroNIDAZOLE (METROCREAM) 0.75 % cream as needed. 02/16/16   Historical Provider, MD  polyethylene glycol powder (GLYCOLAX/MIRALAX) powder Take 17 g by mouth 2 (two) times daily as needed. 10/10/15   Evie Lacks Plotnikov, MD  ranitidine (ZANTAC) 150 MG tablet TAKE 1 TABLET TWICE DAILY. 01/08/16   Evie Lacks Plotnikov, MD  sildenafil (REVATIO) 20 MG  tablet Take 1-3 tablets (20-60 mg total) by mouth daily as needed. 02/16/16   Evie Lacks Plotnikov, MD  simvastatin (ZOCOR) 40 MG tablet TAKE 1/2 BY MOUTH AT BEDTIME 05/15/15   Minus Breeding, MD   BP 120/82 mmHg  Pulse 115  Temp(Src) 97.8 F (36.6 C) (Oral)  Resp 13  SpO2 98% Physical Exam  Constitutional: He is oriented to person, place, and time. He appears well-developed and well-nourished. No distress.  HENT:  Head: Atraumatic.  Mouth/Throat: Oropharynx is clear and moist.  4 cm laceration to scalp.   Eyes: Conjunctivae are normal. Pupils are equal, round, and reactive to light. No scleral icterus.  Neck: Normal range of motion. Neck supple. No tracheal deviation present.  No bruits.   Cardiovascular: Normal rate, regular rhythm, normal heart sounds and intact distal pulses.   No murmur heard. Pulmonary/Chest: Effort normal and breath sounds normal. No accessory muscle usage. No respiratory distress. He exhibits no tenderness.  Abdominal: Soft. Bowel sounds are normal. He exhibits no distension. There is no tenderness.  No puls mass.   Genitourinary:  No cva tenderness  Musculoskeletal: Normal range of motion.  CTLS spine, non tender, aligned, no step off. Good rom bil ext without pain or focal bony tenderness, distal pulses palp. Abrasions/skin tear to RUE.  Neurological: He is alert and oriented to person, place, and time.  Motor intact bil. stre 5/5. sens grossly intact.   Skin: Skin is warm and dry. He is not diaphoretic.  Psychiatric: He has a normal mood and affect.  Nursing note and vitals reviewed.   ED Course  .Marland KitchenLaceration Repair Date/Time: 05/28/2016 1:16 PM Performed by: Lajean Saver Authorized by: Lajean Saver Consent: Verbal consent obtained. Body area: head/neck Laceration length: 4 cm Anesthesia: local infiltration Local anesthetic: lidocaine 1% with epinephrine Anesthetic total: 5 ml Irrigation solution: saline Irrigation method: syringe Skin  closure: 4-0 Prolene Number of sutures: 7 Suture technique: interrupted. Dressing: antibiotic ointment   (including critical care time) Labs Review  Results for orders placed or performed during the hospital encounter of 05/28/16  CBC  Result Value Ref Range   WBC 7.9 4.0 - 10.5 K/uL   RBC 4.88 4.22 - 5.81 MIL/uL   Hemoglobin 15.2 13.0 - 17.0 g/dL   HCT 46.5 39.0 - 52.0 %   MCV 95.3 78.0 - 100.0 fL   MCH 31.1 26.0 - 34.0 pg   MCHC 32.7 30.0 - 36.0 g/dL   RDW 13.4 11.5 - 15.5 %   Platelets 125 (L) 150 - 400 K/uL  Basic metabolic panel  Result Value Ref Range   Sodium 136 135 - 145 mmol/L   Potassium 5.6 (H) 3.5 - 5.1 mmol/L   Chloride 106 101 - 111 mmol/L   CO2 22 22 - 32 mmol/L   Glucose, Bld 95 65 - 99 mg/dL   BUN 38 (H) 6 - 20 mg/dL   Creatinine, Ser 1.70 (H) 0.61 - 1.24 mg/dL   Calcium 8.4 (L) 8.9 - 10.3 mg/dL  GFR calc non Af Amer 33 (L) >60 mL/min   GFR calc Af Amer 38 (L) >60 mL/min   Anion gap 8 5 - 15  Urinalysis, Routine w reflex microscopic (not at [facility])  Result Value Ref Range   Color, Urine YELLOW YELLOW   APPearance CLEAR CLEAR   Specific Gravity, Urine 1.014 1.005 - 1.030   pH 5.5 5.0 - 8.0   Glucose, UA NEGATIVE NEGATIVE mg/dL   Hgb urine dipstick NEGATIVE NEGATIVE   Bilirubin Urine NEGATIVE NEGATIVE   Ketones, ur NEGATIVE NEGATIVE mg/dL   Protein, ur NEGATIVE NEGATIVE mg/dL   Nitrite NEGATIVE NEGATIVE   Leukocytes, UA NEGATIVE NEGATIVE   Ct Head Wo Contrast  05/28/2016  CLINICAL DATA:  82 year old male with a history of fall EXAM: CT HEAD WITHOUT CONTRAST TECHNIQUE: Contiguous axial images were obtained from the base of the skull through the vertex without intravenous contrast. COMPARISON:  None. FINDINGS: Unremarkable appearance of the calvarium without acute fracture or aggressive lesion. Soft tissue swelling with disruption of the soft tissues at the frontal vertex, new from the comparison CT. In the interval, the prior soft tissue swelling in the  left supraorbital scalp has resolved. No radiopaque foreign body. Unremarkable appearance of the bilateral orbits. Mastoid air cells are clear. No significant paranasal sinus disease No acute hemorrhage. No midline shift or mass effect. Gray-white differentiation maintained. Configuration the ventricles unchanged. Senescent calcifications of the basal ganglia. Intracranial atherosclerosis. IMPRESSION: No CT evidence of acute intracranial abnormality. Soft tissue swelling in the vertex of the scalp, frontal region, without associated fracture. Signed, Dulcy Fanny. Earleen Newport, DO [facility] Electronically Signed   By: Corrie Mckusick D.O.   On: 05/28/2016 11:56       I have personally reviewed and evaluated these images and lab results as part of my medical decision-making.   EKG Interpretation   Date/Time:  Tuesday May 28 2016 12:26:15 EDT Ventricular Rate:  107 PR Interval:    QRS Duration: 136 QT Interval:  401 QTC Calculation: 536 R Axis:   -83 Text Interpretation:  Sinus tachycardia RBBB and LAFB Confirmed by Ashok Cordia   MD, Lennette Bihari ([id number]) on 05/28/2016 12:38:29 PM      MDM   Iv ns. Continuous pulse ox and monitor.  Labs.  Reviewed nursing notes and prior charts for additional history.   Wound sutured by [facility] ED medical student - I was present during the procedure/suturing.   For syncope w head injury, patient admitted.   Hospitalists consulted for admission.      Lajean Saver, MD 05/28/16 1318

## 2016-05-28 NOTE — Patient Instructions (Signed)
Please go to ER now (we will call EMS)

## 2016-05-28 NOTE — Assessment & Plan Note (Signed)
etilogy unclear, except related to dizziness, will need urgent evaluation in ED, pt refuses EMS, will drive himself across street to [facility] ER

## 2016-05-28 NOTE — H&P (Signed)
History and Physical    [name] [name] [id number] DOB: 1919/11/26 DOA: 05/28/2016   PCP: [name] Kehr, MD   Patient coming from/Resides with: Private residence/lives alone  Chief Complaint: Syncope and collapse with scalp laceration  HPI: [name] [name] is a 82 y.o. male with medical history significant for hypertension, prior bradycardia, known CAD medically managed, BPH and erectile dysfunction, anxiety and depression, CKD 3, and osteoarthritis who was sent to the ER from his primary care physician's office after experiencing a syncopal episode with apparent loss of consciousness. The patient reported that he fell after becoming dizzy while mowing today. He attempted to at least get the lawnmower that towards the home and made it as far as the sidewalk. The next thing the patient members he was waking up on the sidewalk with extensive bleeding. He went to his PCP in an attempt to avoid possibly needing to come to the ER but due to the syncopal episode and extensive scalp bleeding he was sent to the ER for further evaluation and treatment. Patient reports prior to the syncopal episode he was not experiencing chest pain, shortness of breath or palpitations. He has not been sick recently. He did take his normal medications including his diuretics prior to working outside today.  ED Course:  Vital signs: PO temp 97.8-BP 105/90-pulse 119-respirations 22-room air saturations 97% CT head without contrast: No acute intercranial hemorrhage, no midline shift or mass effect, soft tissue swelling in the vertex of the scalp frontal region without associated fracture Laboratory data: Sodium 136, potassium 5.6, BUN 38, creatinine 1.7, anion gap 8, WBC 7900 differential not obtained, hemoglobin 15.2, platelets 125,000, glucose 95, urinalysis unremarkable Medications and treatments: Normal saline at [facility], lidocaine intradermal for laceration repair, Tdap intramuscular 1 dose  Review of Systems:  In addition to  the HPI above,  No Fever-chills, myalgias or other constitutional symptoms No Headache, changes with Vision or hearing, new weakness, tingling, numbness in any extremity No problems swallowing food or Liquids, indigestion/reflux No Chest pain, Cough or Shortness of Breath, palpitations, orthopnea or DOE No Abdominal pain, N/V; no melena or hematochezia, no dark tarry stools, Bowel movements are regular, No dysuria, hematuria or flank pain No new skin rashes, lesions, masses or bruises, No new joints pains-aches No recent weight gain or loss No polyuria, polydypsia or polyphagia,   Past Medical History  Diagnosis Date  . Hypertension   . Hyperlipidemia   . Coronary artery disease   . Cardiomyopathy   . Hemorrhoids     Past Surgical History  Procedure Laterality Date  . Tonsilectomy, adenoidectomy, bilateral myringotomy and tubes    . Colonic polyps    . Cholecystectomy    . Inguinal hernia repair    . Appendectomy      Social History   Social History  . Marital Status: Widowed    Spouse Name: N/A  . Number of Children: N/A  . Years of Education: N/A   Occupational History  . Not on file.   Social History Main Topics  . Smoking status: Never Smoker   . Smokeless tobacco: Never Used  . Alcohol Use: No  . Drug Use: No  . Sexual Activity: No   Other Topics Concern  . 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 Lisinopril     cough  Family history reviewed and not pertinent to current admission symptomatology   Prior to Admission medications   Medication Sig Start Date End Date Taking? Authorizing Provider  ALPRAZolam [name] [name]) 1 MG tablet Take 1 tablet (1 mg total) by mouth at bedtime. 04/12/16   [name] Lacks Plotnikov, MD  aspirin EC 81 MG tablet Take 81 mg by mouth daily.    Historical Provider, MD    clobetasol ointment (TEMOVATE) [id number] % Apply 1 application topically 2 (two) times daily. 04/12/16   [name] Lacks Plotnikov, MD  diclofenac (VOLTAREN) 75 MG EC tablet Take 1 tablet (75 mg total) by mouth 2 (two) times daily. 04/12/16   [name] Lacks Plotnikov, MD  furosemide (LASIX) 20 MG tablet Take 1 tablet (20 mg total) by mouth daily. 05/23/15   [name] Lacks Plotnikov, MD  gabapentin (NEURONTIN) 100 MG capsule Take 1 capsule (100 mg total) by mouth 3 (three) times daily as needed (neuropathy). 08/22/15   [name] Anger, MD  linaclotide (LINZESS) 145 MCG CAPS capsule Take 1-2 capsules (145-290 mcg total) by mouth daily as needed. 05/17/16   [name] Anger, MD  losartan-hydrochlorothiazide (HYZAAR) 50-12.5 MG per tablet TAKE 1 BY MOUTH DAILY 06/30/15   [name] Breeding, MD  metoprolol succinate (TOPROL-XL) 50 MG 24 hr tablet Take 1 tablet (50 mg total) by mouth daily. 05/15/16   [name] Breeding, MD  metroNIDAZOLE (METROCREAM) 0.75 % cream as needed. 02/16/16   Historical Provider, MD  polyethylene glycol powder (GLYCOLAX/MIRALAX) powder Take 17 g by mouth 2 (two) times daily as needed. 10/10/15   [name] Lacks Plotnikov, MD  ranitidine (ZANTAC) 150 MG tablet TAKE 1 TABLET TWICE DAILY. 01/08/16   [name] Lacks Plotnikov, MD  sildenafil (REVATIO) 20 MG tablet Take 1-3 tablets (20-60 mg total) by mouth daily as needed. 02/16/16   [name] Anger, MD  simvastatin (ZOCOR) 40 MG tablet TAKE 1/2 BY MOUTH AT BEDTIME 05/15/15   [name] Breeding, MD    Physical Exam: Filed Vitals:   05/28/16 1115 05/28/16 1234 05/28/16 1236 05/28/16 1238  BP: 120/82 134/85 126/94 137/90  Pulse: 115 104 107   Temp:      TempSrc:      Resp: 13     SpO2: 98%         Constitutional: NAD, calm, comfortable Eyes: PERRL, lids and conjunctivae normal ENMT: Mucous membranes are slightly dry. Posterior pharynx clear of any exudate or lesions. Normal dentition.  Neck: normal, supple, no masses, no thyromegaly Respiratory: clear to  auscultation bilaterally, no wheezing, no crackles. Normal respiratory effort. No accessory muscle use.  Cardiovascular: Regular rate and rhythm, no murmurs / rubs / gallops. No extremity edema. 2+ pedal pulses. No carotid bruits.  Abdomen: no tenderness, no masses palpated. No hepatosplenomegaly. Bowel sounds positive.  Musculoskeletal: no clubbing / cyanosis. No joint deformity upper and lower extremities. Good ROM, no contractures. Normal muscle tone.  Skin: no rashes, lesions, ulcers or induration except for laceration on superior scalp that has been repaired by the ED P; patient has contusion with abrasion on right forearm Neurologic: CN 2-12 grossly intact. Sensation intact, DTR normal. Strength 5/5 x all 4 extremities.  Psychiatric: Normal judgment and insight. Alert and oriented x 3. Normal mood.    Labs on Admission: I have personally reviewed following labs and imaging studies  CBC:  Recent Labs Lab 05/28/16 1230  WBC 7.9  HGB 15.2  HCT 46.5  MCV 95.3  PLT 0000000*   Basic Metabolic Panel:  Recent Labs Lab 05/28/16  1230  NA 136  K 5.6*  CL 106  CO2 22  GLUCOSE 95  BUN 38*  CREATININE 1.70*  CALCIUM 8.4*   GFR: Estimated Creatinine Clearance: 27.8 mL/min (by C-G formula based on Cr of 1.7). Liver Function Tests: No results for input(s): AST, ALT, ALKPHOS, BILITOT, PROT, ALBUMIN in the last 168 hours. No results for input(s): LIPASE, AMYLASE in the last 168 hours. No results for input(s): AMMONIA in the last 168 hours. Coagulation Profile: No results for input(s): INR, PROTIME in the last 168 hours. Cardiac Enzymes: No results for input(s): CKTOTAL, CKMB, CKMBINDEX, TROPONINI in the last 168 hours. BNP (last 3 results) No results for input(s): PROBNP in the last 8760 hours. HbA1C: No results for input(s): HGBA1C in the last 72 hours. CBG: No results for input(s): GLUCAP in the last 168 hours. Lipid Profile: No results for input(s): CHOL, HDL, LDLCALC, TRIG,  CHOLHDL, LDLDIRECT in the last 72 hours. Thyroid Function Tests: No results for input(s): TSH, T4TOTAL, FREET4, T3FREE, THYROIDAB in the last 72 hours. Anemia Panel: No results for input(s): VITAMINB12, FOLATE, FERRITIN, TIBC, IRON, RETICCTPCT in the last 72 hours. Urine analysis:    Component Value Date/Time   COLORURINE YELLOW 05/28/2016 [place] 05/28/2016 1239   LABSPEC 1.014 05/28/2016 1239   PHURINE 5.5 05/28/2016 1239   GLUCOSEU NEGATIVE 05/28/2016 1239   GLUCOSEU NEGATIVE 10/05/2015 1237   HGBUR NEGATIVE 05/28/2016 1239   BILIRUBINUR NEGATIVE 05/28/2016 1239   KETONESUR NEGATIVE 05/28/2016 1239   PROTEINUR NEGATIVE 05/28/2016 1239   UROBILINOGEN 0.2 10/05/2015 1237   NITRITE NEGATIVE 05/28/2016 1239   LEUKOCYTESUR NEGATIVE 05/28/2016 1239   Sepsis Labs: @LABRCNTIP (procalcitonin:4,lacticidven:4) )No results found for this or any previous visit (from the past 240 hour(s)).   Radiological Exams on Admission: Ct Head Wo Contrast  05/28/2016  CLINICAL DATA:  82 year old male with a history of fall EXAM: CT HEAD WITHOUT CONTRAST TECHNIQUE: Contiguous axial images were obtained from the base of the skull through the vertex without intravenous contrast. COMPARISON:  None. FINDINGS: Unremarkable appearance of the calvarium without acute fracture or aggressive lesion. Soft tissue swelling with disruption of the soft tissues at the frontal vertex, new from the comparison CT. In the interval, the prior soft tissue swelling in the left supraorbital scalp has resolved. No radiopaque foreign body. Unremarkable appearance of the bilateral orbits. Mastoid air cells are clear. No significant paranasal sinus disease No acute hemorrhage. No midline shift or mass effect. Gray-white differentiation maintained. Configuration the ventricles unchanged. Senescent calcifications of the basal ganglia. Intracranial atherosclerosis. IMPRESSION: No CT evidence of acute intracranial abnormality.  Soft tissue swelling in the vertex of the scalp, frontal region, without associated fracture. Signed, Dulcy Fanny. Earleen Newport, DO [facility] Radiology Electronically Signed   By: Corrie Mckusick D.O.   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, ventricular rate 170s per minute, patient has prolonged QTC 536 ms in setting of bundle branch block  Assessment/Plan Principal Problem:   Syncope and collapse -Patient was dizzy prior to experiencing syncopal episode with subsequent collapse and at least short-term loss of consciousness; denied shortness of breath, chest pain or palpitations prior to syncopal episode -Utilize syncope admission set orders to include echocardiogram -Mobilize with assistance initially -Was not orthostatic but as precaution we'll hold diuretics at time of admission -Suspect patient became simply overheated with exertion -Patient has  known CAD on medical therapy with beta blockers, baby aspirin and statin so as precaution we'll cycle troponin  Active Problems:   Scalp laceration/Contusion of right forearm -Sustained after fall -Routine wound care -Laceration approximated in the ER by EDP    History of sinus bradycardia -Currently tachycardic despite home use of beta blockers -Follow telemetry and event patient had non-perceived arrhythmia that precipitated syncope    Acute hyperkalemia/ CKD (chronic kidney disease) stage 3, GFR 30-59 ml/min -In setting of patient with chronic kidney disease on diuretics including Aldactone -Holding diuretics as above -Follow labs    Essential hypertension -Continue preadmission Toprol    Thrombocytopenia (CHRONIC) -Platelet stable and at baseline which typically greater than 100,000    ANXIETY DEPRESSION -Continue preadmission Xanax    BPH (benign prostatic  hyperplasia) -Not on alpha blocker prior to admission    Erectile dysfunction -Has not used sildenafil in greater than 48 hours      DVT prophylaxis: Lovenox Code Status: DO NOT RESUSCITATE Family Communication: No family at bedside-patient is a widower and son is currently in blowing [place] Disposition Plan: Anticipate discharge back to preadmission home environment once medically stable Consults called: None  Admission status: Observation/telemetry    Imri Lor L. ANP-BC [facility] Pager [phone]   If 7PM-7AM, please contact night-coverage [email or web address] Password [email or web address]  05/28/2016, 2:02 PM

## 2016-05-28 NOTE — ED Notes (Signed)
Pt arrives via EMS from [place] [facility] where pt drove self to office after having a syncopal event with +LOC while using the push mower this AM. Pt take 81mg  ASA daily for heart. Pt with approx 2cm laceration to head from fall on to concrete. States had some dizziness yesterday. Currently awake, alert, oriented x4, CBG 132. Last tetanus unknown. 22g L wrist.

## 2016-05-28 NOTE — Assessment & Plan Note (Addendum)
Etiology unclear, will need at least Head CT i suspect, pt to go to ER,needs labs as well, ? IVF's

## 2016-05-29 ENCOUNTER — Observation Stay (HOSPITAL_BASED_OUTPATIENT_CLINIC_OR_DEPARTMENT_OTHER): Payer: Medicare Other

## 2016-05-29 DIAGNOSIS — I4819 Other persistent atrial fibrillation: Secondary | ICD-10-CM | POA: Diagnosis present

## 2016-05-29 DIAGNOSIS — I5022 Chronic systolic (congestive) heart failure: Secondary | ICD-10-CM | POA: Diagnosis not present

## 2016-05-29 DIAGNOSIS — R7989 Other specified abnormal findings of blood chemistry: Secondary | ICD-10-CM | POA: Diagnosis not present

## 2016-05-29 DIAGNOSIS — I484 Atypical atrial flutter: Secondary | ICD-10-CM | POA: Diagnosis not present

## 2016-05-29 DIAGNOSIS — T148 Other injury of unspecified body region: Secondary | ICD-10-CM | POA: Diagnosis not present

## 2016-05-29 DIAGNOSIS — I248 Other forms of acute ischemic heart disease: Secondary | ICD-10-CM | POA: Diagnosis not present

## 2016-05-29 DIAGNOSIS — Z23 Encounter for immunization: Secondary | ICD-10-CM | POA: Diagnosis not present

## 2016-05-29 DIAGNOSIS — I10 Essential (primary) hypertension: Secondary | ICD-10-CM | POA: Diagnosis not present

## 2016-05-29 DIAGNOSIS — T07XXXA Unspecified multiple injuries, initial encounter: Secondary | ICD-10-CM | POA: Diagnosis present

## 2016-05-29 DIAGNOSIS — N400 Enlarged prostate without lower urinary tract symptoms: Secondary | ICD-10-CM

## 2016-05-29 DIAGNOSIS — I130 Hypertensive heart and chronic kidney disease with heart failure and stage 1 through stage 4 chronic kidney disease, or unspecified chronic kidney disease: Secondary | ICD-10-CM | POA: Diagnosis not present

## 2016-05-29 DIAGNOSIS — I481 Persistent atrial fibrillation: Secondary | ICD-10-CM

## 2016-05-29 DIAGNOSIS — S0101XA Laceration without foreign body of scalp, initial encounter: Secondary | ICD-10-CM

## 2016-05-29 DIAGNOSIS — R55 Syncope and collapse: Secondary | ICD-10-CM

## 2016-05-29 DIAGNOSIS — E875 Hyperkalemia: Secondary | ICD-10-CM | POA: Diagnosis not present

## 2016-05-29 DIAGNOSIS — I420 Dilated cardiomyopathy: Secondary | ICD-10-CM | POA: Diagnosis not present

## 2016-05-29 DIAGNOSIS — I480 Paroxysmal atrial fibrillation: Secondary | ICD-10-CM | POA: Diagnosis not present

## 2016-05-29 DIAGNOSIS — D696 Thrombocytopenia, unspecified: Secondary | ICD-10-CM | POA: Diagnosis not present

## 2016-05-29 DIAGNOSIS — N183 Chronic kidney disease, stage 3 (moderate): Secondary | ICD-10-CM | POA: Diagnosis not present

## 2016-05-29 LAB — BASIC METABOLIC PANEL
ANION GAP: 6 (ref 5–15)
BUN: 38 mg/dL — ABNORMAL HIGH (ref 6–20)
CALCIUM: 7.7 mg/dL — AB (ref 8.9–10.3)
CO2: 25 mmol/L (ref 22–32)
Chloride: 105 mmol/L (ref 101–111)
Creatinine, Ser: 1.63 mg/dL — ABNORMAL HIGH (ref 0.61–1.24)
GFR calc Af Amer: 40 mL/min — ABNORMAL LOW (ref >60)
GFR, EST NON AFRICAN AMERICAN: 34 mL/min — AB (ref >60)
GLUCOSE: 103 mg/dL — AB (ref 65–99)
Potassium: 5.3 mmol/L — ABNORMAL HIGH (ref 3.5–5.1)
SODIUM: 136 mmol/L (ref 135–145)

## 2016-05-29 LAB — GLUCOSE, CAPILLARY: Glucose-Capillary: 83 mg/dL (ref 65–99)

## 2016-05-29 LAB — ECHOCARDIOGRAM COMPLETE
HEIGHTINCHES: 68 in
Weight: 2992 oz

## 2016-05-29 LAB — TROPONIN I: Troponin I: 0.06 ng/mL (ref <0.03)

## 2016-05-29 MED ORDER — DILTIAZEM HCL 100 MG IV SOLR: 5.0000 mg/h | INTRAVENOUS | Status: DC

## 2016-05-29 MED ORDER — PERFLUTREN LIPID MICROSPHERE
1.0000 mL | INTRAVENOUS | Status: AC | PRN
  Administered 2016-05-29: 3 mL via INTRAVENOUS
  Filled 2016-05-29 (×2): qty 10

## 2016-05-29 MED ORDER — APIXABAN 2.5 MG PO TABS
2.5000 mg | ORAL_TABLET | Freq: Two times a day (BID) | ORAL | Status: DC
  Administered 2016-05-29 – 2016-06-05 (×13): 2.5 mg via ORAL
  Filled 2016-05-29 (×14): qty 1

## 2016-05-29 MED ORDER — DILTIAZEM HCL 100 MG IV SOLR
5.0000 mg/h | INTRAVENOUS | Status: DC
  Administered 2016-05-29: 20 mg/h via INTRAVENOUS
  Administered 2016-05-29 – 2016-05-30 (×2): 5 mg/h via INTRAVENOUS
  Filled 2016-05-29 (×2): qty 100

## 2016-05-29 NOTE — Progress Notes (Signed)
3E28, [place]: Pt am EKG AFlutter, heart rate 120-140's. Scheduled Toprol given. MD made aware. Orders received for Cardiology consult. Will continue to monitor pt. Rosana Fret RN

## 2016-05-29 NOTE — Progress Notes (Signed)
PROGRESS NOTE    [name] [name]  [id number] DOB: [birth year] DOA: 05/28/2016 PCP: Walker Kehr, MD  Outpatient Specialists:   Brief Narrative: 82 y.o. male with medical history significant for hypertension, prior bradycardia, known CAD medically managed, BPH and erectile dysfunction, anxiety and depression, CKD 3, and osteoarthritis who was sent to the ER from his primary care physician's office after experiencing a syncopal episode with apparent loss of consciousness. The patient reported that he fell after becoming dizzy while mowing today. He attempted to at least get the lawnmower that towards the home and made it as far as the sidewalk. The next thing the patient members he was waking up on the sidewalk with extensive bleeding. He went to his PCP in an attempt to avoid possibly needing to come to the ER but due to the syncopal episode and extensive scalp bleeding he was sent to the ER for further evaluation and treatment. Patient reports prior to the syncopal episode he was not experiencing chest pain, shortness of breath or palpitations. He has not been sick recently. He did take his normal medications including his diuretics prior to working outside today.   Assessment & Plan:   Principal Problem:   Syncope and collapse Active Problems:   ANXIETY DEPRESSION   Essential hypertension   BPH (benign prostatic hyperplasia)   Erectile dysfunction   Scalp laceration   History of sinus bradycardia   Contusion of right forearm   CKD (chronic kidney disease) stage 3, GFR 30-59 ml/min   Acute hyperkalemia   Thrombocytopenia (CHRONIC)   Contusion of head   Elevated troponin  Syncope and collapse - Rapid atrial flutter noted earlier. Patient tells me that he has long history of irregular heart beats (?Tachy-Brady syndrome). Cardiology consulted. Complete syncope work up.  Active Problems:  Scalp laceration/Contusion of right forearm -Sustained after fall -Routine wound care -Laceration  approximated in the ER by EDP   History of sinus bradycardia - See above. -Currently tachycardic despite home use of beta blockers -Follow telemetry and event patient had non-perceived arrhythmia that precipitated syncope   Mild hyperkalemia/ CKD (chronic kidney disease) stage 3, GFR 30-59 ml/min - ARB on hold. On IVF. Renal panel in am. -In setting of patient with chronic kidney disease on diuretics including Aldactone -Holding diuretics as above -Follow labs   Essential hypertension -Continue preadmission Toprol   Thrombocytopenia (CHRONIC) -Platelet stable and at baseline which typically greater than 100,000   ANXIETY DEPRESSION -Continue preadmission Xanax   BPH (benign prostatic hyperplasia) -Not on alpha blocker prior to admission   Erectile dysfunction -Has not used sildenafil in greater than 48 hours     DVT prophylaxis: Lovenox Code Status: DO NOT RESUSCITATE Family Communication: No family at bedside-patient is a widower and son is currently in blowing [place] Disposition Plan: Anticipate discharge back to preadmission home environment once medically stable Consults called: None  Admission status: Observation/telemetry  Consultants:   Cardiology  Procedures:   ECHO  Antimicrobials:    None   Subjective: Nil new complaints. No SOB. No chest pain. No fever or chills. Undergoing ECHO. Rapid aflutter noted on telemonitoring.  Objective: Filed Vitals:   05/28/16 2013 05/29/16 0020 05/29/16 0518 05/29/16 1231  BP: 130/86 117/86 128/88 126/64  Pulse: 108 111 92 84  Temp: 97.7 F (36.5 C) 97.7 F (36.5 C) 97.5 F (36.4 C) 97.6 F (36.4 C)  TempSrc: Oral Oral Oral Oral  Resp: 20 20 20 20   Height:  Weight:   84.823 kg (187 lb)   SpO2: 98% 98% 98% 98%    Intake/Output Summary (Last 24 hours) at 05/29/16 1459 Last data filed at 05/29/16 1423  Gross per 24 hour  Intake   1200 ml  Output    950 ml  Net    250 ml   Filed  Weights   05/28/16 1516 05/29/16 0518  Weight: 86.138 kg (189 lb 14.4 oz) 84.823 kg (187 lb)    Examination:  General exam: Appears calm and comfortable  Respiratory system: Clear to auscultation. Respiratory effort normal. Cardiovascular system: S1 & S2.  No pedal edema. Gastrointestinal system: Abdomen is nondistended, soft and nontender.   Central nervous system: Alert and oriented. No focal neurological deficits. Extremities: Symmetric 5 x 5 power.  Data Reviewed: I have personally reviewed following labs and imaging studies  CBC:  Recent Labs Lab 05/28/16 1230  WBC 7.9  HGB 15.2  HCT 46.5  MCV 95.3  PLT 0000000*   Basic Metabolic Panel:  Recent Labs Lab 05/28/16 1230 05/29/16 0129  NA 136 136  K 5.6* 5.3*  CL 106 105  CO2 22 25  GLUCOSE 95 103*  BUN 38* 38*  CREATININE 1.70* 1.63*  CALCIUM 8.4* 7.7*   GFR: Estimated Creatinine Clearance: 29.4 mL/min (by C-G formula based on Cr of 1.63). Liver Function Tests: No results for input(s): AST, ALT, ALKPHOS, BILITOT, PROT, ALBUMIN in the last 168 hours. No results for input(s): LIPASE, AMYLASE in the last 168 hours. No results for input(s): AMMONIA in the last 168 hours. Coagulation Profile: No results for input(s): INR, PROTIME in the last 168 hours. Cardiac Enzymes:  Recent Labs Lab 05/28/16 1437 05/28/16 1945 05/29/16 0129  TROPONINI 0.07* 0.06* 0.06*   BNP (last 3 results) No results for input(s): PROBNP in the last 8760 hours. HbA1C: No results for input(s): HGBA1C in the last 72 hours. CBG:  Recent Labs Lab 05/29/16 0607  GLUCAP 83   Lipid Profile: No results for input(s): CHOL, HDL, LDLCALC, TRIG, CHOLHDL, LDLDIRECT in the last 72 hours. Thyroid Function Tests: No results for input(s): TSH, T4TOTAL, FREET4, T3FREE, THYROIDAB in the last 72 hours. Anemia Panel: No results for input(s): VITAMINB12, FOLATE, FERRITIN, TIBC, IRON, RETICCTPCT in the last 72 hours. Urine analysis:    Component  Value Date/Time   COLORURINE YELLOW 05/28/2016 [place] 05/28/2016 1239   LABSPEC 1.014 05/28/2016 1239   PHURINE 5.5 05/28/2016 1239   GLUCOSEU NEGATIVE 05/28/2016 1239   GLUCOSEU NEGATIVE 10/05/2015 1237   HGBUR NEGATIVE 05/28/2016 1239   BILIRUBINUR NEGATIVE 05/28/2016 1239   KETONESUR NEGATIVE 05/28/2016 1239   PROTEINUR NEGATIVE 05/28/2016 1239   UROBILINOGEN 0.2 10/05/2015 1237   NITRITE NEGATIVE 05/28/2016 1239   LEUKOCYTESUR NEGATIVE 05/28/2016 1239   Sepsis Labs: @LABRCNTIP (procalcitonin:4,lacticidven:4)  )No results found for this or any previous visit (from the past 240 hour(s)).   Radiology Studies: Ct Head Wo Contrast  05/28/2016  CLINICAL DATA:  82 year old male with a history of fall EXAM: CT HEAD WITHOUT CONTRAST TECHNIQUE: Contiguous axial images were obtained from the base of the skull through the vertex without intravenous contrast. COMPARISON:  None. FINDINGS: Unremarkable appearance of the calvarium without acute fracture or aggressive lesion. Soft tissue swelling with disruption of the soft tissues at the frontal vertex, new from the comparison CT. In the interval, the prior soft tissue swelling in the left supraorbital scalp has resolved. No radiopaque foreign body. Unremarkable appearance of the bilateral orbits. Mastoid air  cells are clear. No significant paranasal sinus disease No acute hemorrhage. No midline shift or mass effect. Gray-white differentiation maintained. Configuration the ventricles unchanged. Senescent calcifications of the basal ganglia. Intracranial atherosclerosis. IMPRESSION: No CT evidence of acute intracranial abnormality. Soft tissue swelling in the vertex of the scalp, frontal region, without associated fracture. Signed, Dulcy Fanny. Earleen Newport, DO [facility] Radiology Electronically Signed   By: Corrie Mckusick D.O.   On: 05/28/2016 11:56   Scheduled Meds: . ALPRAZolam  1 mg Oral QHS    . aspirin EC  81 mg Oral Daily  . enoxaparin (LOVENOX) injection  30 mg Subcutaneous Q24H  . famotidine  10 mg Oral BID  . metoprolol succinate  50 mg Oral Daily  . sodium chloride flush  3 mL Intravenous Q12H   Continuous Infusions: . sodium chloride 20 mL/hr (05/28/16 1224)   Time spent: 30 Minutes  Dana Allan, MD  [facility] Pager #: [phone] 7PM-7AM contact night coverage as above

## 2016-05-29 NOTE — Progress Notes (Addendum)
Cardiologist on call as well as hospitalist on call notified of pt having a 2.5 s pause & RN turning his cardizem drip off per orders due to this. Spoke with cardiologist on call Marye Round) about pt having pauses of 2.5 s as well as pt converting back into NSR/ST. Per MD keep the drip going. Will pass on to oncoming RN. Hoover Brunette, RN

## 2016-05-29 NOTE — Care Management Obs Status (Signed)
[facility] NOTIFICATION   Patient Details  Name: [name] [name] MRN: [id number] Date of Birth: [birth year]   Medicare Observation Status Notification Given:  Yes    Royston Bake, RN 05/29/2016, 4:05 PM

## 2016-05-29 NOTE — Progress Notes (Signed)
  Echocardiogram 2D Echocardiogram has been performed with definity.  Aggie Cosier 05/29/2016, 3:41 PM

## 2016-05-29 NOTE — H&P (Signed)
Date: 05/29/2016               Patient Name:  [name] [name] MRN: [id number]  DOB: 1920/02/06 Age / Sex: 82 y.o., male   PCP: Cassandria Anger, MD                   Attending Physician: Dr. Minus Breeding, MD                               Chief Complaint: syncope   History of Present Illness: Pt is a 82 yo man with PMH HTN, HLD, systolic CHF and chronic bradycardia presents after syncopal episode 7/11 around 9 am. While he was push mowing the lawn he began to feel dizzy and nauseous. He lost consciousness for a second but regained conscious as he hit the concrete. He immediately knew where he was and knew to go into the house, when he saw his forehead was bleeding badly he drove to his cardiologist office and was transported to the ED by EMS from there. He had taken all of his medications prior to mowing  He denies any CP or palpitations prior to the loss of conscious and denies any signs of post ictal state.  His EKG showed sinus tach with rate of 107. CT showed no intracranial abnormalities or bleed. Troponins were 0.07>0.06.   Meds: Current Facility-Administered Medications  Medication Dose Route Frequency Provider Last Rate Last Dose  . 0.9 %  sodium chloride infusion   Intravenous Continuous Lajean Saver, MD 20 mL/hr at 05/28/16 1224 20 mL/hr at 05/28/16 1224  . acetaminophen (TYLENOL) tablet 650 mg  650 mg Oral Q6H PRN Samella Parr, NP       Or  . acetaminophen (TYLENOL) suppository 650 mg  650 mg Rectal Q6H PRN Samella Parr, NP      . ALPRAZolam Duanne Moron) tablet 1 mg  1 mg Oral QHS Samella Parr, NP   1 mg at 05/28/16 2128  . apixaban (ELIQUIS) tablet 2.5 mg  2.5 mg Oral BID Pixie Casino, MD      . diltiazem (CARDIZEM) 100 mg in dextrose 5 % 100 mL (1 mg/mL) infusion  5-20 mg/hr Intravenous Titrated Pixie Casino, MD 20 mL/hr at 05/29/16 1823 20 mg/hr at 05/29/16 1823  . famotidine (PEPCID) tablet 10 mg  10 mg Oral BID Samella Parr, NP   10 mg at 05/29/16 0948   . metoprolol succinate (TOPROL-XL) 24 hr tablet 50 mg  50 mg Oral Daily Samella Parr, NP   50 mg at 05/29/16 0948  . oxyCODONE (Oxy IR/ROXICODONE) immediate release tablet 5 mg  5 mg Oral Q4H PRN Samella Parr, NP      . sodium chloride flush (NS) 0.9 % injection 3 mL  3 mL Intravenous Q12H Samella Parr, NP   3 mL at 05/28/16 2129    Allergies: Allergies as of 05/28/2016 - Review Complete 05/28/2016  Allergen Reaction Noted  . Lisinopril  03/02/2012   Past Medical History  Diagnosis Date  . Hypertension   . Hyperlipidemia   . Coronary artery disease   . Cardiomyopathy   . Hemorrhoids   . Anxiety   . Skipped heart beats   . Arthritis     "shoulders" (05/28/2016)  . Depression     hx (05/28/2016)    Family History: Mother had "a heart that skipped". No other  family cardiac history that he knows of   Social History: Pt lives at home by himself. Wife and daughter both passed away passed away in the same week  2 years ago. He denied the use of alcohol or cigarettes.   Review of Systems: A complete ROS was negative except as per HPI.   Physical Exam: Blood pressure 126/64, pulse 84, temperature 97.6 F (36.4 C), temperature source Oral, resp. rate 20, height 5\' 8"  (1.727 m), weight 187 lb (84.823 kg), SpO2 98 %. Physical Exam  Constitutional: He appears well-developed and well-nourished. No distress.  Neck: No JVD present.  Cardiovascular: An irregularly irregular rhythm present.  No murmur heard. Respiratory: Effort normal and breath sounds normal. No respiratory distress.  GI: Soft. Bowel sounds are normal. He exhibits no distension. There is no tenderness.  Extremities: no lower extremity edema, diminished distal pulses   EKG: Atrial fibrillation rate of 128  Assessment & Plan by Problem: Principal Problem:   Syncope and collapse Active Problems:   ANXIETY DEPRESSION   Essential hypertension   BPH (benign prostatic hyperplasia)   Erectile dysfunction   Scalp  laceration   History of sinus bradycardia   Contusion of right forearm   CKD (chronic kidney disease) stage 3, GFR 30-59 ml/min   Acute hyperkalemia   Thrombocytopenia (CHRONIC)   Contusion of head   Elevated troponin  Pt is a 82 yo man with PMH HTN, HLD, systolic CHF, and chronic bradycardia who presented with a first time syncopal episode and was found to be in atrial fibrillation on EKG.   New onset Afib most likely the cause of his syncopal episode. CHA2DS2 VASc= 4   Start diltiazem 5mg /hr for rate control   Start low dose eliquis 2.5 (due to his age>82 and SCr>1.5)    D/C ASA   Will monitor his rate and consider cardioversion for rhythm control   Sycope first episode, likely related to his new arrhythmia, CT showed no acute bleed   monitor for changes in neurologic function  HTN   Continue metoprolol   CKD BUN 38, SCr 1.63, GFR 34  Continue IV NS   FEN IV NS  Monitor K 5.6 on admission trended down to 5.3 today  Cardiac diet  DVT PPx on eliquis  Code status: DNR   Signed: Ledell Noss, MD 05/29/2016, 6:41 PM  Pager: [phone]

## 2016-05-30 DIAGNOSIS — R55 Syncope and collapse: Secondary | ICD-10-CM | POA: Insufficient documentation

## 2016-05-30 DIAGNOSIS — I484 Atypical atrial flutter: Secondary | ICD-10-CM | POA: Diagnosis not present

## 2016-05-30 DIAGNOSIS — I420 Dilated cardiomyopathy: Secondary | ICD-10-CM

## 2016-05-30 DIAGNOSIS — I481 Persistent atrial fibrillation: Secondary | ICD-10-CM | POA: Diagnosis not present

## 2016-05-30 LAB — RENAL FUNCTION PANEL
Albumin: 2.8 g/dL — ABNORMAL LOW (ref 3.5–5.0)
Anion gap: 7 (ref 5–15)
BUN: 39 mg/dL — ABNORMAL HIGH (ref 6–20)
CO2: 23 mmol/L (ref 22–32)
Calcium: 7.8 mg/dL — ABNORMAL LOW (ref 8.9–10.3)
Chloride: 108 mmol/L (ref 101–111)
Creatinine, Ser: 1.38 mg/dL — ABNORMAL HIGH (ref 0.61–1.24)
GFR calc Af Amer: 49 mL/min — ABNORMAL LOW (ref >60)
GFR calc non Af Amer: 42 mL/min — ABNORMAL LOW (ref >60)
Glucose, Bld: 95 mg/dL (ref 65–99)
Phosphorus: 3.5 mg/dL (ref 2.5–4.6)
Potassium: 4.5 mmol/L (ref 3.5–5.1)
Sodium: 138 mmol/L (ref 135–145)

## 2016-05-30 LAB — GLUCOSE, CAPILLARY: GLUCOSE-CAPILLARY: 90 mg/dL (ref 65–99)

## 2016-05-30 MED ORDER — FUROSEMIDE 20 MG PO TABS
20.0000 mg | ORAL_TABLET | Freq: Every day | ORAL | Status: DC
  Administered 2016-05-31 – 2016-06-02 (×3): 20 mg via ORAL
  Filled 2016-05-30 (×3): qty 1

## 2016-05-30 MED ORDER — METOPROLOL SUCCINATE ER 50 MG PO TB24
75.0000 mg | ORAL_TABLET | Freq: Every day | ORAL | Status: DC
  Administered 2016-05-30 – 2016-06-03 (×5): 75 mg via ORAL
  Filled 2016-05-30 (×6): qty 1

## 2016-05-30 NOTE — Consult Note (Deleted)
See H&P for cardiology consult note   Pixie Casino, MD, [facility] Attending Cardiologist [place]

## 2016-05-30 NOTE — Progress Notes (Signed)
Subjective: Mr. Brackens is a 82 yo man with PMH HTN, HLD, systolic CHF, presumed CAD and chronic bradycardia who presented with a first syncopal episode. He was found to be in afib/flutter on EKG and CT showed no intracranial abnormalities in the ED. He was started on Eliquis and Diltiazem drip which has controlled his rate.   Yesterday's Echo showed EF 20% with diffuse left ventricle hypokinesia. Last night he briefly returned to NSR after a 2 sec pause but then went back into Afib.    Today Mr. Carithers states he is feeling much better, he hadn't noticed how weak and queezy he had been feeling yesterday until he started feeling much better today. Last night he slept well. Denies CP, SOB, palpitations, wheeze or cough. No new neurological deficits.   Objective: Vital signs in last 24 hours: Temp:  [97.4 F (36.3 C)-97.6 F (36.4 C)] 97.4 F (36.3 C) (07/13 0304) Pulse Rate:  [59-84] 59 (07/13 0304) Resp:  [16-20] 17 (07/13 0304) BP: (102-126)/(50-64) 102/51 mmHg (07/13 0304) SpO2:  [97 %-98 %] 98 % (07/13 0304) Weight:  [189 lb 11.2 oz (86.047 kg)] 189 lb 11.2 oz (86.047 kg) (07/13 0304) Weight change: -3.2 oz (-0.091 kg)  Physical Exam: Physical Exam  Constitutional: He appears well-developed and well-nourished. No distress.  He is lying down sleeping in bed   HENT:  Scalp laceration bandaging clean dry and intact.   Eyes: Right eye exhibits no discharge.  R eye chronic cataract and inflammatory changes.   Neck: No JVD present.  Cardiovascular: Normal rate, regular rhythm, S1 normal and S2 normal.   No murmur heard. Rate is noticeably slower than yesterday   Respiratory: Effort normal and breath sounds normal. No respiratory distress. He has no wheezes. He has no rales.  Skin:  L upper arm contusion. IV left wrist  Extremities: no lower extremity edema, pulses 1+ bilateral, his feet are cold   Intake/Output from previous day: 07/12 0701 - 07/13 0700 In: 720 [P.O.:720] Out:  400 [Urine:400]  Intake/Output Summary (Last 24 hours) at 05/30/16 0621 Last data filed at 05/30/16 0254  Gross per 24 hour  Intake    720 ml  Output    400 ml  Net    320 ml     Recent Labs Lab 05/28/16 1230 05/29/16 0129 05/30/16 0427  NA 136 136 138  K 5.6* 5.3* 4.5  CL 106 105 108  CO2 22 25 23   BUN 38* 38* 39*  CREATININE 1.70* 1.63* 1.38*     Recent Labs Lab 05/28/16 1230  WBC 7.9  HGB 15.2  HCT 46.5  PLT 125*   Invalid input(s): TROP,  BNP   Recent Labs Lab 05/28/16 1437 05/28/16 1945 05/29/16 0129  TROPONINI 0.07* 0.06* 0.06*    Imaging:  TELE: Afib with HR in the 60s, PVCs Echo: LVEF of 20%, There is severe LV systolic impairement and moderate RV systolic impairement.  Mild mitral regurgitation, mild tricuspid regurgitation   Cath: none here  Stress: stress perfusion study demonstrating an EF of 46% with moderate ischemia in the inferior wall and apex recorded in epic 05/2009 CT head: No CT evidence of acute intracranial abnormality.  Assessment/Plan: Principal Problem:   Syncope and collapse Active Problems:   ANXIETY DEPRESSION   Essential hypertension   BPH (benign prostatic hyperplasia)   Erectile dysfunction   Scalp laceration   History of sinus bradycardia   Contusion of right forearm   CKD (chronic kidney disease) stage 3,  GFR 30-59 ml/min   Acute hyperkalemia   Thrombocytopenia (CHRONIC)   Contusion of head   Elevated troponin   Abrasion, multiple sites   Atypical atrial flutter (HCC)   Persistent atrial fibrillation ([place])   New onset Afib CHA2DS2 VASc is 4. HAS-BLED is 3. His rate converted to NSR after a 2s pause but then returned to Afib. Current Telemetry shows Afib with HR in the 60s   Continue Eliquis 2.5 mg BID, Diltiazem 5 ml/hr   His rate has been controlled. Will wean him off of diltiazem drip and switch to p.o. Metoprolol 75mg . In light of his  cardiomyopathy found on echo yesterday the metoprolol will serve to help  with any heart failure.   Will continue to monitor his rhythm, if he does not return to NSR we will consider TEE followed by Cardoversion for rhythm control. He may not be a good candidate for long term anticoagulation given his history of mechanical falls (twice in the last year) related to significantly poor vision in his right eye. He would need to receive 5 doses of Eliquis before cardioversion, today will be doses 2&3 of 5.   Syncope Scalp laceration, CT showed no acute bleed. Most likely related to his newly discovered Afib.   Monitor for changes in neurologic status   Cardiomyopathy Echo yesterday showed diffuse hypokinesis and LVEF of 20-25%. Previous stress test found an EF of 46% with moderate ischemia in the inferior wall and apex recorded in epic office note 05/2009.   Most likely tachycardia mediated. I/O +1,000 ml. Clinically appears euvolemic.   Continue Metoprolol 75mg    Order BNP, he has no prior baseline BNP but this will give more information on status of his heart failure     HTN currently BP 102/51  Continue metoprolol   CKD BUN 39, SCr 1.38, GFR 42. GFR is improving, up from 34 yesterday.  Continue IV NS   Hold home lasix   FEN Continue IV NS   Hyperkalemia has trended down from admission K 5.6 > 4.5  Cardiac diet  DVT PPx on eliquis Code status: DNR    Ledell Noss, MD 05/30/2016, 6:21 AM Pager: [phone]

## 2016-05-30 NOTE — Progress Notes (Signed)
PROGRESS NOTE    [name] [name]  [id number] DOB: [birth year] DOA: 05/28/2016 PCP: Walker Kehr, MD  Outpatient Specialists:   Brief Narrative: 82 y.o. male with medical history significant for hypertension, prior bradycardia, known CAD medically managed, BPH and erectile dysfunction, anxiety and depression, CKD 3, and osteoarthritis who was sent to the ER from his primary care physician's office after experiencing a syncopal episode with apparent loss of consciousness. Patient reports prior to the syncopal episode he was not experiencing chest pain, shortness of breath or palpitations. Seen by cardiology noted to be in rate controlled Afib. Started on Eliquis last night and being transitioned from IV diltiazem to long acting beta blocker.  This Am he has no complaints feels perfectly fine. No chestpain, palpitations or dizziness.  Assessment & Plan:   Principal Problem:   Syncope and collapse Active Problems:   ANXIETY DEPRESSION   Essential hypertension   BPH (benign prostatic hyperplasia)   Erectile dysfunction   Scalp laceration   History of sinus bradycardia   Contusion of right forearm   CKD (chronic kidney disease) stage 3, GFR 30-59 ml/min   Acute hyperkalemia   Thrombocytopenia (CHRONIC)   Contusion of head   Elevated troponin   Abrasion, multiple sites   Atypical atrial flutter (HCC)   Persistent atrial fibrillation (HCC)   Faintness   Congestive dilated cardiomyopathy (HCC)  Syncope and collapse - Rapid atrial flutter noted earlier. Patient tells me that he has long history of irregular heart beats (?Tachy-Brady syndrome). Cardiology consulted. Complete syncope work up. - IV dilt weaning - Toprol XL per cards - eliquis - plan outpt cardioversion once well anticoagulated  Active Problems:  Scalp laceration/Contusion of right forearm -Sustained after fall -Routine wound care -Laceration approximated in the ER by EDP   History of sinus bradycardia - See  above. -Currently tachycardic despite home use of beta blockers -Follow telemetry and event patient had non-perceived arrhythmia that precipitated syncope   Mild hyperkalemia/ CKD (chronic kidney disease) stage 3, GFR 30-59 ml/min - ARB on hold. On IVF. Renal panel in am. -In setting of patient with chronic kidney disease on diuretics including Aldactone -Holding diuretics as above -Follow labs   Essential hypertension -Continue preadmission Toprol   Thrombocytopenia (CHRONIC) -Platelet stable and at baseline which typically greater than 100,000   ANXIETY DEPRESSION -Continue preadmission Xanax   BPH (benign prostatic hyperplasia) -Not on alpha blocker prior to admission   Erectile dysfunction -Has not used sildenafil in greater than 48 hours   DVT prophylaxis: Lovenox Code Status: DO NOT RESUSCITATE Family Communication: No family at bedside-patient is a widower and son lives out of town. Disposition Plan: Anticipate discharge back to preadmission home environment once medically stable, possibly in AM. Consults called: None  Admission status: Observation/telemetry  Consultants:   Cardiology  Procedures:   ECHO  Antimicrobials:    None    Objective: Filed Vitals:   05/30/16 0304 05/30/16 1000 05/30/16 1107 05/30/16 1211  BP: 102/51 111/46  102/72  Pulse: 59  75 64  Temp: 97.4 F (36.3 C)   97.2 F (36.2 C)  TempSrc: Oral   Oral  Resp: 17   18  Height:      Weight: 86.047 kg (189 lb 11.2 oz)     SpO2: 98%   97%    Intake/Output Summary (Last 24 hours) at 05/30/16 1259 Last data filed at 05/30/16 0900  Gross per 24 hour  Intake 662.92 ml  Output    700 ml  Net -37.08 ml   Filed Weights   05/28/16 1516 05/29/16 0518 05/30/16 0304  Weight: 86.138 kg (189 lb 14.4 oz) 84.823 kg (187 lb) 86.047 kg (189 lb 11.2 oz)    Examination:  General exam: Appears calm and comfortable  Respiratory system: Clear to auscultation. Respiratory effort  normal. Cardiovascular system: S1 & S2.  No pedal edema. Irregular rate. Gastrointestinal system: Abdomen is nondistended, soft and nontender.   Central nervous system: Alert and oriented. No focal neurological deficits. Extremities: Symmetric 5 x 5 power.  Data Reviewed: I have personally reviewed following labs and imaging studies  CBC:  Recent Labs Lab 05/28/16 1230  WBC 7.9  HGB 15.2  HCT 46.5  MCV 95.3  PLT 0000000*   Basic Metabolic Panel:  Recent Labs Lab 05/28/16 1230 05/29/16 0129 05/30/16 0427  NA 136 136 138  K 5.6* 5.3* 4.5  CL 106 105 108  CO2 22 25 23   GLUCOSE 95 103* 95  BUN 38* 38* 39*  CREATININE 1.70* 1.63* 1.38*  CALCIUM 8.4* 7.7* 7.8*  PHOS  --   --  3.5   GFR: Estimated Creatinine Clearance: 34.9 mL/min (by C-G formula based on Cr of 1.38). Liver Function Tests:  Recent Labs Lab 05/30/16 0427  ALBUMIN 2.8*   No results for input(s): LIPASE, AMYLASE in the last 168 hours. No results for input(s): AMMONIA in the last 168 hours. Coagulation Profile: No results for input(s): INR, PROTIME in the last 168 hours. Cardiac Enzymes:  Recent Labs Lab 05/28/16 1437 05/28/16 1945 05/29/16 0129  TROPONINI 0.07* 0.06* 0.06*   BNP (last 3 results) No results for input(s): PROBNP in the last 8760 hours. HbA1C: No results for input(s): HGBA1C in the last 72 hours. CBG:  Recent Labs Lab 05/29/16 0607 05/30/16 0501  GLUCAP 83 90   Lipid Profile: No results for input(s): CHOL, HDL, LDLCALC, TRIG, CHOLHDL, LDLDIRECT in the last 72 hours. Thyroid Function Tests: No results for input(s): TSH, T4TOTAL, FREET4, T3FREE, THYROIDAB in the last 72 hours. Anemia Panel: No results for input(s): VITAMINB12, FOLATE, FERRITIN, TIBC, IRON, RETICCTPCT in the last 72 hours. Urine analysis:    Component Value Date/Time   COLORURINE YELLOW 05/28/2016 [place] 05/28/2016 1239   LABSPEC 1.014 05/28/2016 1239   PHURINE 5.5 05/28/2016 1239    GLUCOSEU NEGATIVE 05/28/2016 1239   GLUCOSEU NEGATIVE 10/05/2015 1237   HGBUR NEGATIVE 05/28/2016 1239   BILIRUBINUR NEGATIVE 05/28/2016 1239   KETONESUR NEGATIVE 05/28/2016 1239   PROTEINUR NEGATIVE 05/28/2016 1239   UROBILINOGEN 0.2 10/05/2015 1237   NITRITE NEGATIVE 05/28/2016 1239   LEUKOCYTESUR NEGATIVE 05/28/2016 1239   Sepsis Labs: @LABRCNTIP (procalcitonin:4,lacticidven:4)  )No results found for this or any previous visit (from the past 240 hour(s)).   Radiology Studies: No results found. Scheduled Meds: . ALPRAZolam  1 mg Oral QHS  . apixaban  2.5 mg Oral BID  . famotidine  10 mg Oral BID  . [START ON 05/31/2016] furosemide  20 mg Oral Daily  . metoprolol succinate  75 mg Oral Daily  . sodium chloride flush  3 mL Intravenous Q12H   Continuous Infusions: . sodium chloride 20 mL/hr (05/28/16 1224)   Time spent: 30 Minutes  Dana Allan, MD  [facility] Pager #: [phone] 7PM-7AM contact night coverage as above

## 2016-05-31 DIAGNOSIS — I480 Paroxysmal atrial fibrillation: Secondary | ICD-10-CM | POA: Diagnosis not present

## 2016-05-31 DIAGNOSIS — R55 Syncope and collapse: Secondary | ICD-10-CM | POA: Diagnosis not present

## 2016-05-31 DIAGNOSIS — T148 Other injury of unspecified body region: Secondary | ICD-10-CM | POA: Diagnosis not present

## 2016-05-31 DIAGNOSIS — I484 Atypical atrial flutter: Secondary | ICD-10-CM | POA: Diagnosis not present

## 2016-05-31 DIAGNOSIS — E785 Hyperlipidemia, unspecified: Secondary | ICD-10-CM | POA: Diagnosis present

## 2016-05-31 DIAGNOSIS — I248 Other forms of acute ischemic heart disease: Secondary | ICD-10-CM | POA: Diagnosis not present

## 2016-05-31 DIAGNOSIS — Z66 Do not resuscitate: Secondary | ICD-10-CM | POA: Diagnosis present

## 2016-05-31 DIAGNOSIS — Z23 Encounter for immunization: Secondary | ICD-10-CM | POA: Diagnosis not present

## 2016-05-31 DIAGNOSIS — I340 Nonrheumatic mitral (valve) insufficiency: Secondary | ICD-10-CM | POA: Diagnosis not present

## 2016-05-31 DIAGNOSIS — I130 Hypertensive heart and chronic kidney disease with heart failure and stage 1 through stage 4 chronic kidney disease, or unspecified chronic kidney disease: Secondary | ICD-10-CM | POA: Diagnosis not present

## 2016-05-31 DIAGNOSIS — S0093XA Contusion of unspecified part of head, initial encounter: Secondary | ICD-10-CM | POA: Diagnosis present

## 2016-05-31 DIAGNOSIS — I420 Dilated cardiomyopathy: Secondary | ICD-10-CM | POA: Diagnosis not present

## 2016-05-31 DIAGNOSIS — I5022 Chronic systolic (congestive) heart failure: Secondary | ICD-10-CM | POA: Diagnosis not present

## 2016-05-31 DIAGNOSIS — S0101XA Laceration without foreign body of scalp, initial encounter: Secondary | ICD-10-CM | POA: Diagnosis present

## 2016-05-31 DIAGNOSIS — N400 Enlarged prostate without lower urinary tract symptoms: Secondary | ICD-10-CM | POA: Diagnosis present

## 2016-05-31 DIAGNOSIS — F418 Other specified anxiety disorders: Secondary | ICD-10-CM | POA: Diagnosis present

## 2016-05-31 DIAGNOSIS — R000 Tachycardia, unspecified: Secondary | ICD-10-CM | POA: Diagnosis present

## 2016-05-31 DIAGNOSIS — N183 Chronic kidney disease, stage 3 (moderate): Secondary | ICD-10-CM | POA: Diagnosis not present

## 2016-05-31 DIAGNOSIS — I4891 Unspecified atrial fibrillation: Secondary | ICD-10-CM | POA: Diagnosis not present

## 2016-05-31 DIAGNOSIS — I495 Sick sinus syndrome: Secondary | ICD-10-CM | POA: Diagnosis present

## 2016-05-31 DIAGNOSIS — S5011XA Contusion of right forearm, initial encounter: Secondary | ICD-10-CM | POA: Diagnosis present

## 2016-05-31 DIAGNOSIS — N529 Male erectile dysfunction, unspecified: Secondary | ICD-10-CM | POA: Diagnosis present

## 2016-05-31 DIAGNOSIS — D696 Thrombocytopenia, unspecified: Secondary | ICD-10-CM | POA: Diagnosis not present

## 2016-05-31 DIAGNOSIS — I2510 Atherosclerotic heart disease of native coronary artery without angina pectoris: Secondary | ICD-10-CM | POA: Diagnosis present

## 2016-05-31 DIAGNOSIS — I481 Persistent atrial fibrillation: Secondary | ICD-10-CM | POA: Diagnosis not present

## 2016-05-31 DIAGNOSIS — I4510 Unspecified right bundle-branch block: Secondary | ICD-10-CM | POA: Diagnosis present

## 2016-05-31 DIAGNOSIS — Z86010 Personal history of colonic polyps: Secondary | ICD-10-CM | POA: Diagnosis not present

## 2016-05-31 DIAGNOSIS — W19XXXA Unspecified fall, initial encounter: Secondary | ICD-10-CM | POA: Diagnosis present

## 2016-05-31 DIAGNOSIS — E875 Hyperkalemia: Secondary | ICD-10-CM | POA: Diagnosis not present

## 2016-05-31 LAB — GLUCOSE, CAPILLARY: Glucose-Capillary: 97 mg/dL (ref 65–99)

## 2016-05-31 LAB — BASIC METABOLIC PANEL
ANION GAP: 8 (ref 5–15)
BUN: 32 mg/dL — ABNORMAL HIGH (ref 6–20)
CO2: 24 mmol/L (ref 22–32)
Calcium: 8.1 mg/dL — ABNORMAL LOW (ref 8.9–10.3)
Chloride: 105 mmol/L (ref 101–111)
Creatinine, Ser: 1.23 mg/dL (ref 0.61–1.24)
GFR calc Af Amer: 56 mL/min — ABNORMAL LOW (ref >60)
GFR, EST NON AFRICAN AMERICAN: 48 mL/min — AB (ref >60)
Glucose, Bld: 97 mg/dL (ref 65–99)
POTASSIUM: 4.3 mmol/L (ref 3.5–5.1)
SODIUM: 137 mmol/L (ref 135–145)

## 2016-05-31 LAB — BRAIN NATRIURETIC PEPTIDE: B Natriuretic Peptide: 629.5 pg/mL — ABNORMAL HIGH (ref 0.0–100.0)

## 2016-05-31 MED ORDER — AMIODARONE HCL IN DEXTROSE 360-4.14 MG/200ML-% IV SOLN
30.0000 mg/h | INTRAVENOUS | Status: DC
  Administered 2016-05-31 – 2016-06-04 (×8): 30 mg/h via INTRAVENOUS
  Filled 2016-05-31 (×8): qty 200

## 2016-05-31 MED ORDER — AMIODARONE HCL IN DEXTROSE 360-4.14 MG/200ML-% IV SOLN
60.0000 mg/h | INTRAVENOUS | Status: AC
  Administered 2016-05-31 (×2): 60 mg/h via INTRAVENOUS
  Filled 2016-05-31 (×2): qty 200

## 2016-05-31 MED ORDER — AMIODARONE LOAD VIA INFUSION
150.0000 mg | Freq: Once | INTRAVENOUS | Status: AC
  Administered 2016-05-31: 150 mg via INTRAVENOUS
  Filled 2016-05-31: qty 83.34

## 2016-05-31 NOTE — Progress Notes (Addendum)
TEE/DCCV scheduled for Tues 06/04/16 at 9am with Dr. Debara Pickett. (no availability on Monday) Resident working with Dr. Debara Pickett has done orders.  Dayna Dunn PA-C

## 2016-05-31 NOTE — Progress Notes (Signed)
Patient heart rate 120-130's sustaining atrial fibrillation.Patient asymptomatic denies chest pain,palpitationsor shortness of breath.Blood pressure 101/66. Paged cardiologist on call.Spoke with Dr.Fudim no new orders received plan to continue to monitor patient overnight and adjust medications in morning.

## 2016-05-31 NOTE — Progress Notes (Signed)
PROGRESS NOTE    [name] [name]  [id number] DOB: May 06, 1920 DOA: 05/28/2016 PCP: Walker Kehr, MD  Outpatient Specialists:   Brief Narrative: 82 y.o. male with medical history significant for hypertension, prior bradycardia, known CAD medically managed, BPH and erectile dysfunction, anxiety and depression, CKD 3, and osteoarthritis who was sent to the ER from his primary care physician's office after experiencing a syncopal episode with apparent loss of consciousness. Patient reports prior to the syncopal episode he was not experiencing chest pain, shortness of breath or palpitations. Seen by cardiology noted to be in rate controlled Afib. Started on Eliquis and beta blocker by cardiology, but rates are uncontrolled this AM.  This Am he has no complaints feels perfectly fine. No chest pain, palpitations or dizziness.  Assessment & Plan:   Principal Problem:   Syncope and collapse Active Problems:   ANXIETY DEPRESSION   Essential hypertension   BPH (benign prostatic hyperplasia)   Erectile dysfunction   Scalp laceration   History of sinus bradycardia   Contusion of right forearm   CKD (chronic kidney disease) stage 3, GFR 30-59 ml/min   Acute hyperkalemia   Thrombocytopenia (CHRONIC)   Contusion of head   Elevated troponin   Abrasion, multiple sites   Atypical atrial flutter (HCC)   Persistent atrial fibrillation (HCC)   Faintness   Congestive dilated cardiomyopathy (HCC)  Syncope and collapse - Rapid atrial flutter noted earlier. Patient tells me that he has long history of irregular heart beats (?Tachy-Brady syndrome). Cardiology consulted. Complete syncope work up. - IV dilt weaned and started on Toprol XL per cards - rate uncontrolled, discussed with cards this AM and starting amiodarone - cont eliquis - plan inpt cardioversion once well anticoagulated, likely Monday 7/16  Active Problems:  Scalp laceration/Contusion of right forearm -Sustained after fall -Routine  wound care -Laceration approximated in the ER by EDP   History of sinus bradycardia - See above. -Currently tachycardic despite home use of beta blockers -Follow telemetry and event patient had non-perceived arrhythmia that precipitated syncope   Mild hyperkalemia/ CKD (chronic kidney disease) stage 3, GFR 30-59 ml/min - ARB on hold. On IVF. Renal panel in am. -In setting of patient with chronic kidney disease on diuretics including Aldactone -Holding diuretics as above -Follow labs   Essential hypertension -Continue preadmission Toprol   Thrombocytopenia (CHRONIC) -Platelet stable and at baseline which typically greater than 100,000   ANXIETY DEPRESSION -Continue preadmission Xanax   BPH (benign prostatic hyperplasia) -Not on alpha blocker prior to admission   Erectile dysfunction -Has not used sildenafil in greater than 48 hours   DVT prophylaxis: Lovenox Code Status: DO NOT RESUSCITATE Family Communication: No family at bedside-patient is a widower and son lives out of town. Disposition Plan: Anticipate discharge back to preadmission home environment once medically stable, possibly in AM. Consults called: None  Admission status: Observation/telemetry  Consultants:   Cardiology  Procedures:   ECHO  Antimicrobials:    None    Objective: Filed Vitals:   05/31/16 0520 05/31/16 0946 05/31/16 0948 05/31/16 1202  BP: 107/63 92/67 105/67 114/88  Pulse: 83     Temp: 97.9 F (36.6 C)   97.5 F (36.4 C)  TempSrc: Oral   Oral  Resp: 18   18  Height:      Weight: 85.684 kg (188 lb 14.4 oz)     SpO2: 96%   98%    Intake/Output Summary (Last 24 hours) at 05/31/16 1215 Last data filed at 05/31/16 1145  Gross per 24 hour  Intake    680 ml  Output   1135 ml  Net   -455 ml   Filed Weights   05/29/16 0518 05/30/16 0304 05/31/16 0520  Weight: 84.823 kg (187 lb) 86.047 kg (189 lb 11.2 oz) 85.684 kg (188 lb 14.4 oz)    Examination:  General exam:  Appears calm and comfortable  Respiratory system: Clear to auscultation. Respiratory effort normal. Cardiovascular system: S1 & S2.  No pedal edema. Irregular rate. Gastrointestinal system: Abdomen is nondistended, soft and nontender.   Central nervous system: Alert and oriented. No focal neurological deficits. Extremities: Symmetric 5 x 5 power.  Data Reviewed: I have personally reviewed following labs and imaging studies  CBC:  Recent Labs Lab 05/28/16 1230  WBC 7.9  HGB 15.2  HCT 46.5  MCV 95.3  PLT 0000000*   Basic Metabolic Panel:  Recent Labs Lab 05/28/16 1230 05/29/16 0129 05/30/16 0427 05/31/16 0255  NA 136 136 138 137  K 5.6* 5.3* 4.5 4.3  CL 106 105 108 105  CO2 22 25 23 24   GLUCOSE 95 103* 95 97  BUN 38* 38* 39* 32*  CREATININE 1.70* 1.63* 1.38* 1.23  CALCIUM 8.4* 7.7* 7.8* 8.1*  PHOS  --   --  3.5  --    GFR: Estimated Creatinine Clearance: 39.1 mL/min (by C-G formula based on Cr of 1.23). Liver Function Tests:  Recent Labs Lab 05/30/16 0427  ALBUMIN 2.8*   No results for input(s): LIPASE, AMYLASE in the last 168 hours. No results for input(s): AMMONIA in the last 168 hours. Coagulation Profile: No results for input(s): INR, PROTIME in the last 168 hours. Cardiac Enzymes:  Recent Labs Lab 05/28/16 1437 05/28/16 1945 05/29/16 0129  TROPONINI 0.07* 0.06* 0.06*   BNP (last 3 results) No results for input(s): PROBNP in the last 8760 hours. HbA1C: No results for input(s): HGBA1C in the last 72 hours. CBG:  Recent Labs Lab 05/29/16 0607 05/30/16 0501 05/31/16 0637  GLUCAP 83 90 97   Lipid Profile: No results for input(s): CHOL, HDL, LDLCALC, TRIG, CHOLHDL, LDLDIRECT in the last 72 hours. Thyroid Function Tests: No results for input(s): TSH, T4TOTAL, FREET4, T3FREE, THYROIDAB in the last 72 hours. Anemia Panel: No results for input(s): VITAMINB12, FOLATE, FERRITIN, TIBC, IRON, RETICCTPCT in the last 72 hours. Urine analysis:      Component Value Date/Time   COLORURINE YELLOW 05/28/2016 [place] 05/28/2016 1239   LABSPEC 1.014 05/28/2016 1239   PHURINE 5.5 05/28/2016 1239   GLUCOSEU NEGATIVE 05/28/2016 1239   GLUCOSEU NEGATIVE 10/05/2015 1237   HGBUR NEGATIVE 05/28/2016 1239   BILIRUBINUR NEGATIVE 05/28/2016 1239   KETONESUR NEGATIVE 05/28/2016 1239   PROTEINUR NEGATIVE 05/28/2016 1239   UROBILINOGEN 0.2 10/05/2015 1237   NITRITE NEGATIVE 05/28/2016 1239   LEUKOCYTESUR NEGATIVE 05/28/2016 1239   Sepsis Labs: @LABRCNTIP (procalcitonin:4,lacticidven:4)  )No results found for this or any previous visit (from the past 240 hour(s)).   Radiology Studies: No results found. Scheduled Meds: . ALPRAZolam  1 mg Oral QHS  . apixaban  2.5 mg Oral BID  . famotidine  10 mg Oral BID  . furosemide  20 mg Oral Daily  . metoprolol succinate  75 mg Oral Daily  . sodium chloride flush  3 mL Intravenous Q12H   Continuous Infusions: . sodium chloride 20 mL/hr (05/28/16 1224)  . amiodarone 60 mg/hr (05/31/16 1145)   Followed by  . amiodarone     Time spent: 32 Minutes  Coron Rossano Hollice Gong, MD  [facility] 7PM-7AM contact night coverage as above

## 2016-05-31 NOTE — Progress Notes (Signed)
Subjective: [name] [name] is a 82 yo man with PMH HTN, HLD, systolic CHF, presumed CAD and chronic bradycardia who presented with a first syncopal episode. He was found to be in afib/flutter on EKG and CT showed no intracranial abnormalities in the ED. He was started on Eliquis and Diltiazem drip which has controlled his rate. Echo showed EF 20% with diffuse left ventricle hypokinesia. Diltiazem drip was switched to Toprol XL 75mg  for treatment of Afib and CHF. At times he has converted to NSR and now he appears to be in SVT.   Today [name] [name] states he is feeling very well. Denies chest pain, sob, weakness, light- headedness, or palpitations. He noticed a slight headache yesterday but it lasted only for a few minutes and was not associated with change in vision or nausea.   Objective: Vital signs in last 24 hours: Temp:  [97.2 F (36.2 C)-97.9 F (36.6 C)] 97.9 F (36.6 C) (07/14 0520) Pulse Rate:  [64-123] 83 (07/14 0520) Resp:  [18] 18 (07/14 0520) BP: (101-114)/(46-85) 107/63 mmHg (07/14 0520) SpO2:  [95 %-97 %] 96 % (07/14 0520) Weight:  [188 lb 14.4 oz (85.684 kg)] 188 lb 14.4 oz (85.684 kg) (07/14 0520) Weight change: -12.8 oz (-0.363 kg)  Physical Exam: Physical Exam  Constitutional: He is oriented to person, place, and time. He appears well-developed and well-nourished. No distress.  Eyes:  Rt eye cataract and chronic inflammatory changes from previous infection  Cardiovascular: Regular rhythm, S1 normal and S2 normal.  Tachycardia present.   No murmur heard. Respiratory: Effort normal and breath sounds normal. No respiratory distress. He has no wheezes. He has no rales.  GI: Soft. Bowel sounds are normal. He exhibits no distension. There is no tenderness.  Neurological: He is alert and oriented to person, place, and time.  Extremities: IV L wrist, R arm contusion, occipital head contusion with dressing clean, dry and intact.   Intake/Output from previous day: 07/13 0701 -  07/14 0700 In: 622.9 [P.O.:560; I.V.:62.9] Out: 1275 [Urine:1275]  Intake/Output Summary (Last 24 hours) at 05/31/16 0605 Last data filed at 05/31/16 0521  Gross per 24 hour  Intake 622.92 ml  Output   1275 ml  Net -652.08 ml    Net I/Os this admission: +437.9    Recent Labs Lab 05/29/16 0129 05/30/16 0427 05/31/16 0255  NA 136 138 137  K 5.3* 4.5 4.3  CL 105 108 105  CO2 25 23 24   BUN 38* 39* 32*  CREATININE 1.63* 1.38* 1.23     Recent Labs Lab 05/28/16 1230  WBC 7.9  HGB 15.2  HCT 46.5  PLT 125*   Invalid input(s): TROP,  BNP  Recent Labs Lab 05/28/16 1437 05/28/16 1945 05/29/16 0129  TROPONINI 0.07* 0.06* 0.06*  BNP 629.5  Imaging:  Tele: Afib with rate 150s Echo: LVEF of 20%, There is severe LV systolic impairement and moderate RV systolic impairement.  Mild mitral regurgitation, mild tricuspid regurgitation (05/29/2016)  Cath: none here  Stress: stress perfusion study demonstrating an EF of 46% with moderate ischemia in the inferior wall and apex recorded in epic 05/2009 CT head: No CT evidence of acute intracranial abnormality.  Assessment/Plan: Principal Problem:   Syncope and collapse Active Problems:   ANXIETY DEPRESSION   Essential hypertension   BPH (benign prostatic hyperplasia)   Erectile dysfunction   Scalp laceration   History of sinus bradycardia   Contusion of right forearm   CKD (chronic kidney disease) stage 3, GFR 30-59 ml/min  Acute hyperkalemia   Thrombocytopenia (CHRONIC)   Contusion of head   Elevated troponin   Abrasion, multiple sites   Atypical atrial flutter (HCC)   Persistent atrial fibrillation (HCC)   Faintness   Congestive dilated cardiomyopathy ([place])  New onset Afib He has converted to NSR momentarily at times but it is not sustained. Currently telemetry shows Afib with HR 150s  Continue Eliquis 2.5 mg BID   His rate had been controlled in the 80s, yesterday he was weaned off the diltiazem drip and  switched Toprol XL 75mg  and today his rates increased once again. Will begin Amiodarone at this time for further control of his tachycardia.   TEE/Cardioversion Tuesday. He is not a good candidate for long term anticoagulation given his recent history of mechanical falls. He would need to receive 5 doses of Eliquis before cardioversion, today will be doses 4&5  Cardiomyopathy BNP 629.5 (05/31/16) This is the first BNP that we have on record. Echo showed diffuse hypokinesia and LVEF 20-25%. (05/29/16) Most likely Tachycardia mediated. Clinically he has remained euvolemic.   Continue Metoprolol 75mg    Restarted maintenance lasix yesterday   Consider starting low dose ACE-I/ARB his kidney functions have improved from baseline   CKD Renal function has continued to improve today BUN 32, SCr 1.23, GFR 48. At baseline his BUN 50, SCr 1.70 and on this admission (7/11) BUN 38, SCr 1.7, GFR 33.  Continue IV NS   HTN currently BP 107/63, HR 83 Continue metoprolol   Syncope Scalp laceration, CT showed no acute bleed. Most likely related to his newly discovered Afib.  Monitor for changes in neurologic status   FEN Continue IV NS  Hyperkalemia improved from admission K 5.6 > 4.5  Cardiac diet  DVT PPx on eliquis Code status DNR     Ledell Noss, MD 05/31/2016, 6:05 AM Pager: [phone]

## 2016-05-31 NOTE — Consult Note (Signed)
[facility] Nurse wound consult note Reason for Consult: head and arm wound after fall Wound type: head laceration and skin tear right upper arm Measurement: right arm: 7cm x 3.5cm x 0.1cm; head laceration: 4cm closed semicircular wound, sutured closed.  Wound bed: right arm is clean, pink, moist. Head laceration is clean Drainage (amount, consistency, odor) bloody drainage, patient on anticoagulation Periwound: intact, however quite a bit of ecchymosis on the right arm Dressing procedure/placement/frequency: Foam dressing to the right arm wound, change every 3 days Xeroform to the head wound, cover with foam.  If bleeding continues, change to calcium alginate for hemostatic properties.   Discussed POC with patient and bedside nurse.  Re consult if needed, will not follow at this time. Thanks  Esmae Donathan [employer], RN,CNS, [place] [phone])

## 2016-06-01 DIAGNOSIS — I5022 Chronic systolic (congestive) heart failure: Secondary | ICD-10-CM

## 2016-06-01 DIAGNOSIS — E875 Hyperkalemia: Secondary | ICD-10-CM

## 2016-06-01 DIAGNOSIS — I248 Other forms of acute ischemic heart disease: Secondary | ICD-10-CM

## 2016-06-01 LAB — GLUCOSE, CAPILLARY: GLUCOSE-CAPILLARY: 106 mg/dL — AB (ref 65–99)

## 2016-06-01 NOTE — Progress Notes (Signed)
Progress Note    [name] [name]  [id number] DOB: [birth year]  DOA: 05/28/2016 PCP: Walker Kehr, MD    Brief Narrative:   [name] [name] is an 82 y.o. male with PMH HTN, HLD, systolic CHF, presumed CAD and chronic bradycardia who Was admitted 05/28/16 with syncope. He was found to be in afib/flutter on EKG and CT showed no intracranial abnormalities in the ED. He was started on Eliquis and Diltiazem drip which has controlled his rate. Echo showed EF 20% with diffuse left ventricle hypokinesia. Diltiazem drip was switched to Toprol XL 75mg  for treatment of Afib and CHF. At times he has converted to NSR and now he appears to be in SVT. TEE/DCCV scheduled for Tues 06/04/16 at 9am with Dr. Debara Pickett.   Assessment/Plan:   Principal Problem:   Syncope and collapse In the setting of rapid atrial flutter/Persistent atrial fibrillation [facility]) Being followed by cardiology. Initially placed on IV diltiazem which was weaned and patient is now on Toprol-XL. Heart rate better controlled, but still suboptimal. Continue amiodarone. For TEE/cardioversion 06/04/16. Not a candidate for long-term anticoagulation secondary to high fall risk. Currently on Eliquis in anticipation of cardioversion.  Active Problems:   ANXIETY DEPRESSION Continue Xanax.    Essential hypertension BP soft.  Continue lasix and metoprolol.  Hyzaar on hold.    Scalp laceration/contusion right forearm/abrasions to multiple sites secondary to fall Continue local wound care.    CKD (chronic kidney disease) stage 3, GFR 30-59 ml/min Baseline creatinine appears to be around 1.6-1.7. Creatinine now better than usual baseline values. Blood pressure soft but could consider starting ACE-I/ARB.    Acute hyperkalemia Resolved.    Thrombocytopenia (CHRONIC) Mild, chronic.    Demand ischemia/Elevated troponin Troponin trend flat. Occurred in the setting of rapid A. fib/flutter.    Congestive dilated cardiomyopathy (HCC)/chronic systolic  CHF Echo showed diffuse hypokinesia and LVEF 20-25%. (05/29/16) Most likely Tachycardia mediated. Clinically he has remained euvolemic. Continue Metoprolol 75mg , lasix. Consider starting low dose ACE-I/ARB his kidney function improved from baseline.    Family Communication/Anticipated D/C date and plan/Code Status   DVT prophylaxis: Lovenox ordered. Code Status: Full Code.  Family Communication: Neighbor present at the bedside. No family present. Disposition Plan: Home after cardioversion, 3 more days likely.   Medical Consultants:    Cardiology   Procedures:   Laceration repair 05/28/16  2-D echo 05/29/16: Severe LV systolic impairment and moderate RV systolic impairment. EF 20-25 percent with diffuse hypokinesis.  Anti-Infectives:   None.  Subjective:   [name] [name] feels well overall. He denies shortness of breath, chest pain, or significant palpitations. Anxious for discharge.  Objective:    Filed Vitals:   05/31/16 1202 05/31/16 1747 06/01/16 0443 06/01/16 1034  BP: 114/88 107/64 112/66 122/80  Pulse:   105 68  Temp: 97.5 F (36.4 C) 98.3 F (36.8 C) 97.8 F (36.6 C)   TempSrc: Oral Oral Oral   Resp: 18 18 19    Height:      Weight:   85.594 kg (188 lb 11.2 oz)   SpO2: 98% 97% 98%     Intake/Output Summary (Last 24 hours) at 06/01/16 1038 Last data filed at 06/01/16 1038  Gross per 24 hour  Intake 10891.84 ml  Output   1270 ml  Net 9621.84 ml   Filed Weights   05/30/16 0304 05/31/16 0520 06/01/16 0443  Weight: 86.047 kg (189 lb 11.2 oz) 85.684 kg (188 lb 14.4 oz) 85.594 kg (188 lb 11.2 oz)  Exam: General exam: Appears calm and comfortable.  Respiratory system: Clear to auscultation. Respiratory effort normal. Cardiovascular system: S1 & S2 heard, RRR. No JVD,  rubs, gallops or clicks. No murmurs. Gastrointestinal system: Abdomen is nondistended, soft and nontender. No organomegaly or masses felt. Normal bowel sounds heard. Central nervous  system: Alert and oriented. No focal neurological deficits. Extremities: No clubbing, edema, or cyanosis. Skin: Large ecchymosis right upper and lower arm. Psychiatry: Judgement and insight appear normal. Mood & affect appropriate.   Data Reviewed:   I have personally reviewed following labs and imaging studies:  Labs: Basic Metabolic Panel:  Recent Labs Lab 05/28/16 1230 05/29/16 0129 05/30/16 0427 05/31/16 0255  NA 136 136 138 137  K 5.6* 5.3* 4.5 4.3  CL 106 105 108 105  CO2 22 25 23 24   GLUCOSE 95 103* 95 97  BUN 38* 38* 39* 32*  CREATININE 1.70* 1.63* 1.38* 1.23  CALCIUM 8.4* 7.7* 7.8* 8.1*  PHOS  --   --  3.5  --    GFR Estimated Creatinine Clearance: 39.1 mL/min (by C-G formula based on Cr of 1.23). Liver Function Tests:  Recent Labs Lab 05/30/16 0427  ALBUMIN 2.8*   CBC:  Recent Labs Lab 05/28/16 1230  WBC 7.9  HGB 15.2  HCT 46.5  MCV 95.3  PLT 125*   Cardiac Enzymes:  Recent Labs Lab 05/28/16 1437 05/28/16 1945 05/29/16 0129  TROPONINI 0.07* 0.06* 0.06*   CBG:  Recent Labs Lab 05/29/16 0607 05/30/16 0501 05/31/16 0637 06/01/16 0545  GLUCAP 83 90 97 106*   Microbiology No results found for this or any previous visit (from the past 240 hour(s)).  Radiology: No results found.  Medications:   . ALPRAZolam  1 mg Oral QHS  . apixaban  2.5 mg Oral BID  . famotidine  10 mg Oral BID  . furosemide  20 mg Oral Daily  . metoprolol succinate  75 mg Oral Daily  . sodium chloride flush  3 mL Intravenous Q12H   Continuous Infusions: . sodium chloride 20 mL/hr (05/28/16 1224)  . amiodarone 30 mg/hr (05/31/16 2244)    Time spent: 35 minutes with > 50% of time discussing current diagnostic test results, clinical impression and plan of care.   LOS: 1 day   RAMA,CHRISTINA  [facility] Pager [phone]. If unable to reach me by pager, please call my cell phone at [phone].  *Please refer to [email or web address], password [email or web address] to get updated  schedule on who will round on this patient, as hospitalists switch teams weekly. If 7PM-7AM, please contact night-coverage at [email or web address], password [email or web address] for any overnight needs.  06/01/2016, 10:38 AM

## 2016-06-01 NOTE — Progress Notes (Addendum)
Patient Name: [name] [name] Date of Encounter: 06/01/2016  Principal Problem:   Syncope and collapse Active Problems:   ANXIETY DEPRESSION   Essential hypertension   BPH (benign prostatic hyperplasia)   Erectile dysfunction   Scalp laceration   History of sinus bradycardia   Contusion of right forearm   CKD (chronic kidney disease) stage 3, GFR 30-59 ml/min   Acute hyperkalemia   Thrombocytopenia (CHRONIC)   Contusion of head   Elevated troponin   Abrasion, multiple sites   Atypical atrial flutter (HCC)   Persistent atrial fibrillation (HCC)   Faintness   Congestive dilated cardiomyopathy ([place])   Length of Stay: 1  SUBJECTIVE  [name] [name] is a 82 yo man with PMH HTN, HLD, systolic CHF, presumed CAD and chronic bradycardia who presented with a first syncopal episode. He was found to be in afib/flutter on EKG and CT showed no intracranial abnormalities in the ED. He was started on Eliquis and Diltiazem drip which has controlled his rate. Echo showed EF 20% with diffuse left ventricle hypokinesia. Diltiazem drip was switched to Toprol XL 75mg  for treatment of Afib and CHF. At times he has converted to NSR and now he appears to be in SVT.   Today [name] [name] states he is feeling very well. Denies chest pain, sob, weakness, light- headedness, or palpitations. No falls.  CURRENT MEDS . ALPRAZolam  1 mg Oral QHS  . apixaban  2.5 mg Oral BID  . famotidine  10 mg Oral BID  . furosemide  20 mg Oral Daily  . metoprolol succinate  75 mg Oral Daily  . sodium chloride flush  3 mL Intravenous Q12H   . sodium chloride 20 mL/hr (05/28/16 1224)  . amiodarone 30 mg/hr (06/01/16 1112)   OBJECTIVE  Filed Vitals:   05/31/16 1747 06/01/16 0443 06/01/16 1034 06/01/16 1150  BP: 107/64 112/66 122/80 112/82  Pulse:  105 68 95  Temp: 98.3 F (36.8 C) 97.8 F (36.6 C)  98 F (36.7 C)  TempSrc: Oral Oral  Oral  Resp: 18 19  18   Height:      Weight:  188 lb 11.2 oz (85.594 kg)    SpO2: 97%  98%  98%    Intake/Output Summary (Last 24 hours) at 06/01/16 1350 Last data filed at 06/01/16 1038  Gross per 24 hour  Intake 10891.84 ml  Output   1110 ml  Net 9781.84 ml   Filed Weights   05/30/16 0304 05/31/16 0520 06/01/16 0443  Weight: 189 lb 11.2 oz (86.047 kg) 188 lb 14.4 oz (85.684 kg) 188 lb 11.2 oz (85.594 kg)   PHYSICAL EXAM  General: Pleasant, NAD. Neuro: Alert and oriented X 3. Moves all extremities spontaneously. Psych: Normal affect. HEENT:  Normal  Neck: Supple without bruits or JVD. Lungs:  Resp regular and unlabored, CTA. Heart: RRR no s3, s4, or murmurs. Abdomen: Soft, non-tender, non-distended, BS + x 4.  Extremities: No clubbing, cyanosis or edema. DP/PT/Radials 2+ and equal bilaterally.  Accessory Clinical Findings  Recent Labs  05/30/16 0427 05/31/16 0255  NA 138 137  K 4.5 4.3  CL 108 105  CO2 23 24  GLUCOSE 95 97  BUN 39* 32*  CREATININE 1.38* 1.23  CALCIUM 7.8* 8.1*  PHOS 3.5  --    Liver Function Tests  Recent Labs  05/30/16 0427  ALBUMIN 2.8*   Radiology/Studies  Ct Head Wo Contrast  05/28/2016  CLINICAL DATA:  82 year old male with a history of fall EXAM:  CT HEAD WITHOUT CONTRAST TECHNIQUE: Contiguous axial images were obtained from the base of the skull through the vertex without intravenous contrast. COMPARISON:  None. FINDINGS: Unremarkable appearance of the calvarium without acute fracture or aggressive lesion. Soft tissue swelling with disruption of the soft tissues at the frontal vertex, new from the comparison CT. In the interval, the prior soft tissue swelling in the left supraorbital scalp has resolved. No radiopaque foreign body. Unremarkable appearance of the bilateral orbits. Mastoid air cells are clear. No significant paranasal sinus disease No acute hemorrhage. No midline shift or mass effect. Gray-white differentiation maintained. Configuration the ventricles unchanged. Senescent calcifications of the basal ganglia.  Intracranial atherosclerosis. IMPRESSION: No CT evidence of acute intracranial abnormality. Soft tissue swelling in the vertex of the scalp, frontal region, without associated fracture. Signed, [name] [name]. [name] [name] [facility] Radiology Electronically Signed   By: [name] [name] D.O.   On: 05/28/2016 11:56   TELE: a-fib with RVR with HR 90-118    ASSESSMENT AND PLAN  Principal Problem:  Syncope and collapse Active Problems:  ANXIETY DEPRESSION  Essential hypertension  BPH (benign prostatic hyperplasia)  Erectile dysfunction  Scalp laceration  History of sinus bradycardia  Contusion of right forearm  CKD (chronic kidney disease) stage 3, GFR 30-59 ml/min  Acute hyperkalemia  Thrombocytopenia (CHRONIC)  Contusion of head  Elevated troponin  Abrasion, multiple sites  Atypical atrial flutter (HCC)  Persistent atrial fibrillation (HCC)  Faintness  Congestive dilated cardiomyopathy ([place])  New onset Afib He has converted to NSR momentarily at times but it is not sustained. Currently telemetry shows Afib with HR now better controlled in the range 90-118, I will not increase metoprolol further as BP is rather low for a 82 year old and he has a recent fall.  Continue Eliquis 2.5 mg BID  Continue Amiodarone at this time for further control of his tachycardia.  TEE/Cardioversion Tuesday. He is not a good candidate for long term anticoagulation given his recent history of mechanical falls. He would need to receive 5 doses of Eliquis before cardioversion.  Cardiomyopathy BNP 629.5 (05/31/16) This is the first BNP that we have on record. Echo showed diffuse hypokinesia and LVEF 20-25%. (05/29/16) Most likely Tachycardia mediated. Clinically he has remained euvolemic.  Continue Metoprolol 75mg   Restarted maintenance lasix yesterday  Consider starting low  dose ACE-I/ARB his kidney functions have improved from baseline   Appears euvolemic  CKD Renal function has continued to improve yesterday BUN 32, SCr 1.23, GFR 48. At baseline his BUN 50, SCr 1.70 and on this admission (7/11) BUN 38, SCr 1.7, GFR 33. Continue IV NS   HTN currently BP 107/63, HR 83 Continue metoprolol   Syncope Scalp laceration, CT showed no acute bleed. Most likely related to his newly discovered Afib.  Monitor for changes in neurologic status   FEN Continue IV NS  Hyperkalemia improved from admission K 5.6 > 4.5  Cardiac diet  DVT PPx on eliquis Code status DNR   Signed, Ena Dawley MD, [facility] 06/01/2016

## 2016-06-01 NOTE — Progress Notes (Signed)
Patient refused bed alarm. Encourage to call for assistance for safety precaution. Will continue to monitor  frequently

## 2016-06-02 DIAGNOSIS — S5011XD Contusion of right forearm, subsequent encounter: Secondary | ICD-10-CM

## 2016-06-02 DIAGNOSIS — S0093XD Contusion of unspecified part of head, subsequent encounter: Secondary | ICD-10-CM

## 2016-06-02 DIAGNOSIS — N183 Chronic kidney disease, stage 3 (moderate): Secondary | ICD-10-CM

## 2016-06-02 LAB — BASIC METABOLIC PANEL
Anion gap: 6 (ref 5–15)
BUN: 34 mg/dL — ABNORMAL HIGH (ref 6–20)
CHLORIDE: 107 mmol/L (ref 101–111)
CO2: 24 mmol/L (ref 22–32)
CREATININE: 1.56 mg/dL — AB (ref 0.61–1.24)
Calcium: 8.3 mg/dL — ABNORMAL LOW (ref 8.9–10.3)
GFR calc non Af Amer: 36 mL/min — ABNORMAL LOW (ref >60)
GFR, EST AFRICAN AMERICAN: 42 mL/min — AB (ref >60)
Glucose, Bld: 102 mg/dL — ABNORMAL HIGH (ref 65–99)
POTASSIUM: 4.1 mmol/L (ref 3.5–5.1)
Sodium: 137 mmol/L (ref 135–145)

## 2016-06-02 LAB — CBC
HEMATOCRIT: 40.1 % (ref 39.0–52.0)
HEMOGLOBIN: 13.3 g/dL (ref 13.0–17.0)
MCH: 31.7 pg (ref 26.0–34.0)
MCHC: 33.2 g/dL (ref 30.0–36.0)
MCV: 95.7 fL (ref 78.0–100.0)
PLATELETS: 115 10*3/uL — AB (ref 150–400)
RBC: 4.19 MIL/uL — AB (ref 4.22–5.81)
RDW: 13.9 % (ref 11.5–15.5)
WBC: 6 10*3/uL (ref 4.0–10.5)

## 2016-06-02 LAB — GLUCOSE, CAPILLARY: Glucose-Capillary: 110 mg/dL — ABNORMAL HIGH (ref 65–99)

## 2016-06-02 NOTE — Progress Notes (Signed)
Progress Note    [name] [name]  [id number] DOB: 1920/01/08  DOA: 05/28/2016 PCP: Walker Kehr, MD    Brief Narrative:   [name] [name] is an 82 y.o. male with PMH HTN, HLD, systolic CHF, presumed CAD and chronic bradycardia who Was admitted 05/28/16 with syncope. He was found to be in afib/flutter on EKG and CT showed no intracranial abnormalities in the ED. He was started on Eliquis and Diltiazem drip which has controlled his rate. Echo showed EF 20% with diffuse left ventricle hypokinesia. Diltiazem drip was switched to Toprol XL 75mg  for treatment of Afib and CHF. At times he has converted to NSR and now he appears to be in SVT. TEE/DCCV scheduled for Tues 06/04/16 at 9am with Dr. Debara Pickett.   Assessment/Plan:   Principal Problem:   Syncope and collapse In the setting of rapid atrial flutter/Persistent atrial fibrillation [facility]) Being followed by cardiology. Initially placed on IV diltiazem which was weaned and patient is now on Toprol-XL. Heart rate better controlled, but still suboptimal. Continue amiodarone. For TEE/cardioversion 06/04/16. Not a candidate for long-term anticoagulation secondary to high fall risk. Currently on Eliquis in anticipation of cardioversion.  Active Problems:   ANXIETY DEPRESSION Continue Xanax.    Essential hypertension BP Stable.  Continue lasix and metoprolol.  Hyzaar on hold.    Scalp laceration/contusion right forearm/abrasions to multiple sites secondary to fall Continue local wound care.    CKD (chronic kidney disease) stage 3, GFR 30-59 ml/min Baseline creatinine appears to be around 1.6-1.7. Creatinine consistent with usual baseline values. Blood pressure soft but could consider starting ACE-I/ARB.    Acute hyperkalemia Resolved.    Thrombocytopenia (CHRONIC) Mild, chronic.    Demand ischemia/Elevated troponin Troponin trend flat. Occurred in the setting of rapid A. fib/flutter.    Congestive dilated cardiomyopathy (HCC)/chronic systolic  CHF Echo showed diffuse hypokinesia and LVEF 20-25%. (05/29/16) Most likely Tachycardia mediated. Clinically he has remained euvolemic. Continue Metoprolol 75mg , lasix. Consider starting low dose ACE-I/ARB his kidney function improved from baseline.   Family Communication/Anticipated D/C date and plan/Code Status   DVT prophylaxis: Lovenox ordered. Code Status: Full Code.  Family Communication: No family present today. Disposition Plan: Home after cardioversion, 2 more days likely.   Medical Consultants:    Cardiology   Procedures:   Laceration repair 05/28/16  2-D echo 05/29/16: Severe LV systolic impairment and moderate RV systolic impairment. EF 20-25 percent with diffuse hypokinesis.  Anti-Infectives:   None.  Subjective:   [name] [name] feels well and is without complaints. He denies shortness of breath, chest pain, or palpitations. No nausea or vomiting. Appetite is good.  Objective:    Filed Vitals:   06/01/16 1034 06/01/16 1150 06/01/16 2235 06/02/16 0712  BP: 122/80 112/82 107/78 118/85  Pulse: 68 95 115 86  Temp:  98 F (36.7 C) 97.8 F (36.6 C) 97.9 F (36.6 C)  TempSrc:  Oral Oral Oral  Resp:  18 18 18   Height:      Weight:    85.231 kg (187 lb 14.4 oz)  SpO2:  98% 98% 96%    Intake/Output Summary (Last 24 hours) at 06/02/16 0856 Last data filed at 06/02/16 0636  Gross per 24 hour  Intake  10816 ml  Output   1375 ml  Net   9441 ml   Filed Weights   05/31/16 0520 06/01/16 0443 06/02/16 0712  Weight: 85.684 kg (188 lb 14.4 oz) 85.594 kg (188 lb 11.2 oz) 85.231 kg (187 lb 14.4  oz)    Exam: General exam: Appears calm and comfortable.  Respiratory system: Clear to auscultation. Respiratory effort normal. Cardiovascular system: S1 & S2 heard, RRR. No JVD,  rubs, gallops or clicks. No murmurs. Gastrointestinal system: Abdomen is nondistended, soft and nontender. No organomegaly or masses felt. Normal bowel sounds heard. Central nervous system:  Alert and oriented. No focal neurological deficits. Extremities: No clubbing, edema, or cyanosis. Skin: Large ecchymosis right upper and lower arm. Psychiatry: Judgement and insight appear normal. Mood & affect appropriate.   Data Reviewed:   I have personally reviewed following labs and imaging studies:  Labs: Basic Metabolic Panel:  Recent Labs Lab 05/28/16 1230 05/29/16 0129 05/30/16 0427 05/31/16 0255 06/02/16 0213  NA 136 136 138 137 137  K 5.6* 5.3* 4.5 4.3 4.1  CL 106 105 108 105 107  CO2 22 25 23 24 24   GLUCOSE 95 103* 95 97 102*  BUN 38* 38* 39* 32* 34*  CREATININE 1.70* 1.63* 1.38* 1.23 1.56*  CALCIUM 8.4* 7.7* 7.8* 8.1* 8.3*  PHOS  --   --  3.5  --   --    GFR Estimated Creatinine Clearance: 30.8 mL/min (by C-G formula based on Cr of 1.56). Liver Function Tests:  Recent Labs Lab 05/30/16 0427  ALBUMIN 2.8*   CBC:  Recent Labs Lab 05/28/16 1230 06/02/16 0213  WBC 7.9 6.0  HGB 15.2 13.3  HCT 46.5 40.1  MCV 95.3 95.7  PLT 125* 115*   Cardiac Enzymes:  Recent Labs Lab 05/28/16 1437 05/28/16 1945 05/29/16 0129  TROPONINI 0.07* 0.06* 0.06*   CBG:  Recent Labs Lab 05/29/16 0607 05/30/16 0501 05/31/16 0637 06/01/16 0545 06/02/16 0633  GLUCAP 83 90 97 106* 110*   Microbiology No results found for this or any previous visit (from the past 240 hour(s)).  Radiology: No results found.  Medications:   . ALPRAZolam  1 mg Oral QHS  . apixaban  2.5 mg Oral BID  . famotidine  10 mg Oral BID  . furosemide  20 mg Oral Daily  . metoprolol succinate  75 mg Oral Daily  . sodium chloride flush  3 mL Intravenous Q12H   Continuous Infusions: . sodium chloride 20 mL/hr (05/28/16 1224)  . amiodarone 30 mg/hr (06/01/16 2128)    Time spent: 25 minutes.   LOS: 2 days   [name] [name]  [facility] Pager [phone]. If unable to reach me by pager, please call my cell phone at [phone].  *Please refer to [email or web address], password [email or web address] to get  updated schedule on who will round on this patient, as hospitalists switch teams weekly. If 7PM-7AM, please contact night-coverage at [email or web address], password [email or web address] for any overnight needs.  06/02/2016, 8:56 AM

## 2016-06-02 NOTE — Progress Notes (Addendum)
Patient Name: [name] [name] Date of Encounter: 06/02/2016  Principal Problem:   Syncope and collapse Active Problems:   ANXIETY DEPRESSION   Essential hypertension   Scalp laceration   History of sinus bradycardia   Contusion of right forearm   CKD (chronic kidney disease) stage 3, GFR 30-59 ml/min   Acute hyperkalemia   Thrombocytopenia (CHRONIC)   Contusion of head   Elevated troponin   Abrasion, multiple sites   Atypical atrial flutter (HCC)   Persistent atrial fibrillation (HCC)   Faintness   Congestive dilated cardiomyopathy (HCC)   Demand ischemia (HCC)   Systolic CHF, chronic (HCC)   Length of Stay: 2  SUBJECTIVE  [name] [name] is a 82 yo man with PMH HTN, HLD, systolic CHF, presumed CAD and chronic bradycardia who presented with a first syncopal episode. He was found to be in afib/flutter on EKG and CT showed no intracranial abnormalities in the ED. He was started on Eliquis and Diltiazem drip which has controlled his rate. Echo showed EF 20% with diffuse left ventricle hypokinesia. Diltiazem drip was switched to Toprol XL 75mg  for treatment of Afib and CHF. At times he has converted to NSR and now he appears to be in SVT.   He feels well today, his washing himself and would like to go for short walk.   CURRENT MEDS . ALPRAZolam  1 mg Oral QHS  . apixaban  2.5 mg Oral BID  . famotidine  10 mg Oral BID  . furosemide  20 mg Oral Daily  . metoprolol succinate  75 mg Oral Daily  . sodium chloride flush  3 mL Intravenous Q12H   . sodium chloride 20 mL/hr (05/28/16 1224)  . amiodarone 30 mg/hr (06/02/16 0935)   OBJECTIVE  Filed Vitals:   06/01/16 1034 06/01/16 1150 06/01/16 2235 06/02/16 0712  BP: 122/80 112/82 107/78 118/85  Pulse: 68 95 115 86  Temp:  98 F (36.7 C) 97.8 F (36.6 C) 97.9 F (36.6 C)  TempSrc:  Oral Oral Oral  Resp:  18 18 18   Height:      Weight:    187 lb 14.4 oz (85.231 kg)  SpO2:  98% 98% 96%    Intake/Output Summary (Last 24 hours)  at 06/02/16 0942 Last data filed at 06/02/16 0636  Gross per 24 hour  Intake  10576 ml  Output   1275 ml  Net   9301 ml   Filed Weights   05/31/16 0520 06/01/16 0443 06/02/16 0712  Weight: 188 lb 14.4 oz (85.684 kg) 188 lb 11.2 oz (85.594 kg) 187 lb 14.4 oz (85.231 kg)   PHYSICAL EXAM  General: Pleasant, NAD. Neuro: Alert and oriented X 3. Moves all extremities spontaneously. Psych: Normal affect. HEENT:  Normal  Neck: Supple without bruits or JVD. Lungs:  Resp regular and unlabored, CTA. Heart: RRR no s3, s4, or murmurs. Abdomen: Soft, non-tender, non-distended, BS + x 4.  Extremities: No clubbing, cyanosis or edema. DP/PT/Radials 2+ and equal bilaterally.  Accessory Clinical Findings  Recent Labs  05/31/16 0255 06/02/16 0213  NA 137 137  K 4.3 4.1  CL 105 107  CO2 24 24  GLUCOSE 97 102*  BUN 32* 34*  CREATININE 1.23 1.56*  CALCIUM 8.1* 8.3*   Ct Head Wo Contrast  05/28/2016  CLINICAL DATA:  82 year old male with a history of fall EXAM: CT HEAD WITHOUT CONTRAST TECHNIQUE: Contiguous axial images were obtained from the base of the skull through the vertex without intravenous contrast.  COMPARISON:  None. FINDINGS: Unremarkable appearance of the calvarium without acute fracture or aggressive lesion. Soft tissue swelling with disruption of the soft tissues at the frontal vertex, new from the comparison CT. In the interval, the prior soft tissue swelling in the left supraorbital scalp has resolved. No radiopaque foreign body. Unremarkable appearance of the bilateral orbits. Mastoid air cells are clear. No significant paranasal sinus disease No acute hemorrhage. No midline shift or mass effect. Gray-white differentiation maintained. Configuration the ventricles unchanged. Senescent calcifications of the basal ganglia. Intracranial atherosclerosis. IMPRESSION: No CT evidence of acute intracranial abnormality. Soft tissue swelling in the vertex of the scalp, frontal region, without  associated fracture. Signed, Dulcy Fanny. Earleen Newport, DO [facility] Radiology Electronically Signed   By: Corrie Mckusick D.O.   On: 05/28/2016 11:56   TELE: a-fib with RVR with HR 90-118    ASSESSMENT AND PLAN  Principal Problem:  Syncope and collapse Active Problems:  ANXIETY DEPRESSION  Essential hypertension  BPH (benign prostatic hyperplasia)  Erectile dysfunction  Scalp laceration  History of sinus bradycardia  Contusion of right forearm  CKD (chronic kidney disease) stage 3, GFR 30-59 ml/min  Acute hyperkalemia  Thrombocytopenia (CHRONIC)  Contusion of head  Elevated troponin  Abrasion, multiple sites  Atypical atrial flutter (HCC)  Persistent atrial fibrillation (HCC)  Faintness  Congestive dilated cardiomyopathy ([place])  New onset Afib He has converted to NSR momentarily at times but it is not sustained. Currently telemetry shows Afib with HR now better controlled in the range 90-105, I will not increase metoprolol further as BP is rather low for a 82 year old and he has a recent fall.  Continue Eliquis 2.5 mg BID  Continue Amiodarone at this time for further control of his tachycardia.  TEE/Cardioversion Tuesday. He is not a good candidate for long term anticoagulation given his recent history of mechanical falls. He would need to receive 5 doses of Eliquis before cardioversion.  Cardiomyopathy BNP 629.5 (05/31/16) This is the first BNP that we have on record. Echo showed diffuse hypokinesia and LVEF 20-25%. (05/29/16) Most likely Tachycardia mediated. Clinically he has remained euvolemic.  Continue Metoprolol 75mg   Restarted maintenance lasix yesterday  Consider starting low dose ACE-I/ARB his kidney functions have improved from baseline   Appears euvolemic  CKD Renal function has continued to improve yesterday BUN 32, SCr 1.23, however  today 1.56, I would hold his lasix as he appears euvolemic.  HTN currently BP 107/63, HR 83 Continue metoprolol   Syncope Scalp laceration, CT showed no acute bleed. Most likely related to his newly discovered Afib.  Monitor for changes in neurologic status   FEN Continue IV NS  Hyperkalemia improved from admission K 5.6 > 4.5  Cardiac diet  DVT PPx on eliquis Code status DNR   Signed, Ena Dawley MD, [facility] 06/02/2016

## 2016-06-03 LAB — BASIC METABOLIC PANEL
ANION GAP: 5 (ref 5–15)
BUN: 32 mg/dL — AB (ref 6–20)
CHLORIDE: 106 mmol/L (ref 101–111)
CO2: 27 mmol/L (ref 22–32)
Calcium: 8.5 mg/dL — ABNORMAL LOW (ref 8.9–10.3)
Creatinine, Ser: 1.43 mg/dL — ABNORMAL HIGH (ref 0.61–1.24)
GFR calc Af Amer: 47 mL/min — ABNORMAL LOW (ref >60)
GFR, EST NON AFRICAN AMERICAN: 40 mL/min — AB (ref >60)
GLUCOSE: 102 mg/dL — AB (ref 65–99)
POTASSIUM: 4.1 mmol/L (ref 3.5–5.1)
SODIUM: 138 mmol/L (ref 135–145)

## 2016-06-03 LAB — GLUCOSE, CAPILLARY: Glucose-Capillary: 104 mg/dL — ABNORMAL HIGH (ref 65–99)

## 2016-06-03 MED ORDER — SODIUM CHLORIDE 0.9 % IV SOLN
INTRAVENOUS | Status: DC
  Administered 2016-06-03: 02:00:00 via INTRAVENOUS

## 2016-06-03 NOTE — Progress Notes (Signed)
Spoke with pt about CODE status pt requested to be full code stated that he did not understand what was said about DNR [facility] notified and pt now is full code. Also pt was instructed that he should call staff if he wants to get up and go to bathroom and that bed alarm will be placed to keep him safe. Arthor Captain LPN

## 2016-06-03 NOTE — Care Management Important Message (Signed)
Important Message  Patient Details  Name: [name] [name] MRN: [id number] Date of Birth: 15-Sep-1920   Medicare Important Message Given:  Yes    Loann Quill 06/03/2016, 2:59 PM

## 2016-06-03 NOTE — Progress Notes (Signed)
    SUBJECTIVE:  No chest pain or SOB.   PHYSICAL EXAM Filed Vitals:   06/02/16 0712 06/02/16 1146 06/02/16 1957 06/03/16 0515  BP: 118/85 121/82 116/76 138/91  Pulse: 86 57 90 97  Temp: 97.9 F (36.6 C) 97.7 F (36.5 C) 97.6 F (36.4 C) 97.6 F (36.4 C)  TempSrc: Oral Oral Oral Oral  Resp: 18 18 18 18   Height:      Weight: 187 lb 14.4 oz (85.231 kg)   186 lb 12.8 oz (84.732 kg)  SpO2: 96% 98% 98% 98%   General:  No distress Lungs:  Clear Heart:  Irregular Abdomen:  Positive bowel sounds, no rebound no guarding Extremities:  No edema   LABS:  Results for orders placed or performed during the hospital encounter of 05/28/16 (from the past 24 hour(s))  Glucose, capillary     Status: Abnormal   Collection Time: 06/03/16  6:01 AM  Result Value Ref Range   Glucose-Capillary 104 (H) 65 - 99 mg/dL    Intake/Output Summary (Last 24 hours) at 06/03/16 0828 Last data filed at 06/03/16 0600  Gross per 24 hour  Intake 1147.6 ml  Output   1200 ml  Net  -52.4 ml    ASSESSMENT AND PLAN:  SYNCOPE:  He has had previous falls.  This is is first syncope that I recall.  I will follow closely as an out patient for any further symptoms or events.   NEW ONSET ATRIAL FIB:  On Eliquis 2.5 bid.  Plan TEE/DCCV (We are working on the timing of this but I suspect that it will be Tuesday.)   Continue amiodarone.   CARDIOMYOPATHY:  Felt to be tachy mediated.   Creat is creeping up.  Keep I/O even. BNP was elevated.  Will avoid adding an ACE inhibitor or ARB for now.     Jeneen Rinks [facility] 06/03/2016 8:28 AM

## 2016-06-03 NOTE — Progress Notes (Signed)
Progress Note    [name] [name]  [id number] DOB: 01-29-1920  DOA: 05/28/2016 PCP: Walker Kehr, MD    Brief Narrative:   [name] [name] is an 82 y.o. male with PMH HTN, HLD, systolic CHF, presumed CAD and chronic bradycardia who Was admitted 05/28/16 with syncope. He was found to be in afib/flutter on EKG and CT showed no intracranial abnormalities in the ED. He was started on Eliquis and Diltiazem drip which has controlled his rate. Echo showed EF 20% with diffuse left ventricle hypokinesia. Diltiazem drip was switched to Toprol XL 75mg  for treatment of Afib and CHF. At times he has converted to NSR and now he appears to be in SVT. TEE/DCCV scheduled for Tues 06/04/16 at 9am with Dr. Debara Pickett.   Assessment/Plan:   Principal Problem:   Syncope and collapse In the setting of rapid atrial flutter/Persistent atrial fibrillation [facility]) Being followed by cardiology. Initially placed on IV diltiazem which was weaned and patient is now on Toprol-XL. Heart rate better controlled, but still suboptimal. Continue amiodarone. For TEE/cardioversion 06/04/16. Not a candidate for long-term anticoagulation secondary to high fall risk. Currently on Eliquis in anticipation of cardioversion.  Active Problems:   ANXIETY DEPRESSION Continue Xanax.    Essential hypertension BP Stable.  Continue lasix and metoprolol.  Hyzaar on hold.    Scalp laceration/contusion right forearm/abrasions to multiple sites secondary to fall Continue local wound care.    CKD (chronic kidney disease) stage 3, GFR 30-59 ml/min Baseline creatinine appears to be around 1.6-1.7. Creatinine consistent with usual baseline values. Blood pressure Stable but would not start ACE-I/ARB until creatinine stable, creatinine up today.    Acute hyperkalemia Resolved.    Thrombocytopenia (CHRONIC) Mild, chronic.    Demand ischemia/Elevated troponin Troponin trend flat. Occurred in the setting of rapid A. fib/flutter.    Congestive dilated  cardiomyopathy (HCC)/chronic systolic CHF Echo showed diffuse hypokinesia and LVEF 20-25%. (05/29/16) Most likely Tachycardia mediated. Clinically he has remained euvolemic. Continue Metoprolol 75mg , lasix. Consider starting low dose ACE-I/ARB his kidney function improved from baseline.   Family Communication/Anticipated D/C date and plan/Code Status   DVT prophylaxis: Lovenox ordered. Code Status: Full Code.  Family Communication: Son updated at the Bedside. Disposition Plan: Home after cardioversion, 2 more days likely.   Medical Consultants:    Cardiology   Procedures:   Laceration repair 05/28/16  2-D echo 05/29/16: Severe LV systolic impairment and moderate RV systolic impairment. EF 20-25 percent with diffuse hypokinesis.  Anti-Infectives:   None.  Subjective:   [name] [name] feels well and is without complaints. He denies shortness of breath, chest pain, or palpitations. No nausea or vomiting. Appetite is good.  Objective:    Filed Vitals:   06/02/16 0712 06/02/16 1146 06/02/16 1957 06/03/16 0515  BP: 118/85 121/82 116/76 138/91  Pulse: 86 57 90 97  Temp: 97.9 F (36.6 C) 97.7 F (36.5 C) 97.6 F (36.4 C) 97.6 F (36.4 C)  TempSrc: Oral Oral Oral Oral  Resp: 18 18 18 18   Height:      Weight: 85.231 kg (187 lb 14.4 oz)   84.732 kg (186 lb 12.8 oz)  SpO2: 96% 98% 98% 98%    Intake/Output Summary (Last 24 hours) at 06/03/16 0835 Last data filed at 06/03/16 0600  Gross per 24 hour  Intake 1147.6 ml  Output   1200 ml  Net  -52.4 ml   Filed Weights   06/01/16 0443 06/02/16 0712 06/03/16 0515  Weight: 85.594 kg (188 lb  11.2 oz) 85.231 kg (187 lb 14.4 oz) 84.732 kg (186 lb 12.8 oz)    Exam: General exam: Appears calm and comfortable.  Respiratory system: Clear to auscultation. Respiratory effort normal. Cardiovascular system: S1 & S2 heard, RRR. No JVD,  rubs, gallops or clicks. No murmurs. Gastrointestinal system: Abdomen is nondistended, soft and  nontender. No organomegaly or masses felt. Normal bowel sounds heard. Central nervous system: Alert and oriented. No focal neurological deficits. Extremities: No clubbing, edema, or cyanosis. Skin: Large ecchymosis right upper and lower arm. Psychiatry: Judgement and insight appear normal. Mood & affect appropriate.   Data Reviewed:   I have personally reviewed following labs and imaging studies:  Labs: Basic Metabolic Panel:  Recent Labs Lab 05/28/16 1230 05/29/16 0129 05/30/16 0427 05/31/16 0255 06/02/16 0213  NA 136 136 138 137 137  K 5.6* 5.3* 4.5 4.3 4.1  CL 106 105 108 105 107  CO2 22 25 23 24 24   GLUCOSE 95 103* 95 97 102*  BUN 38* 38* 39* 32* 34*  CREATININE 1.70* 1.63* 1.38* 1.23 1.56*  CALCIUM 8.4* 7.7* 7.8* 8.1* 8.3*  PHOS  --   --  3.5  --   --    GFR Estimated Creatinine Clearance: 30.7 mL/min (by C-G formula based on Cr of 1.56). Liver Function Tests:  Recent Labs Lab 05/30/16 0427  ALBUMIN 2.8*   CBC:  Recent Labs Lab 05/28/16 1230 06/02/16 0213  WBC 7.9 6.0  HGB 15.2 13.3  HCT 46.5 40.1  MCV 95.3 95.7  PLT 125* 115*   Cardiac Enzymes:  Recent Labs Lab 05/28/16 1437 05/28/16 1945 05/29/16 0129  TROPONINI 0.07* 0.06* 0.06*   CBG:  Recent Labs Lab 05/30/16 0501 05/31/16 0637 06/01/16 0545 06/02/16 0633 06/03/16 0601  GLUCAP 90 97 106* 110* 104*   Microbiology No results found for this or any previous visit (from the past 240 hour(s)).  Radiology: No results found.  Medications:   . ALPRAZolam  1 mg Oral QHS  . apixaban  2.5 mg Oral BID  . famotidine  10 mg Oral BID  . metoprolol succinate  75 mg Oral Daily  . sodium chloride flush  3 mL Intravenous Q12H   Continuous Infusions: . sodium chloride 20 mL/hr (05/28/16 1224)  . sodium chloride 20 mL/hr at 06/03/16 0155  . amiodarone 30 mg/hr (06/02/16 2153)    Time spent: 25 minutes.   LOS: 3 days   [facility] Pager [phone]. If unable to  reach me by pager, please call my cell phone at [phone].  *Please refer to [email or web address], password [email or web address] to get updated schedule on who will round on this patient, as hospitalists switch teams weekly. If 7PM-7AM, please contact night-coverage at [email or web address], password [email or web address] for any overnight needs.  06/03/2016, 8:35 AM

## 2016-06-04 ENCOUNTER — Inpatient Hospital Stay (HOSPITAL_COMMUNITY): Payer: Medicare Other | Admitting: Anesthesiology

## 2016-06-04 ENCOUNTER — Encounter (HOSPITAL_COMMUNITY): Payer: Self-pay | Admitting: *Deleted

## 2016-06-04 ENCOUNTER — Encounter (HOSPITAL_COMMUNITY)
Admission: EM | Disposition: A | Discharge status: Home / Self Care | Payer: Self-pay | Source: Home / Self Care | Attending: Internal Medicine

## 2016-06-04 ENCOUNTER — Inpatient Hospital Stay (HOSPITAL_COMMUNITY): Payer: Medicare Other

## 2016-06-04 DIAGNOSIS — I480 Paroxysmal atrial fibrillation: Principal | ICD-10-CM

## 2016-06-04 DIAGNOSIS — I340 Nonrheumatic mitral (valve) insufficiency: Secondary | ICD-10-CM

## 2016-06-04 HISTORY — PX: CARDIOVERSION: SHX1299

## 2016-06-04 HISTORY — PX: TEE WITHOUT CARDIOVERSION: SHX5443

## 2016-06-04 LAB — BASIC METABOLIC PANEL
Anion gap: 6 (ref 5–15)
BUN: 31 mg/dL — ABNORMAL HIGH (ref 6–20)
CALCIUM: 8.5 mg/dL — AB (ref 8.9–10.3)
CO2: 26 mmol/L (ref 22–32)
CREATININE: 1.5 mg/dL — AB (ref 0.61–1.24)
Chloride: 106 mmol/L (ref 101–111)
GFR calc non Af Amer: 38 mL/min — ABNORMAL LOW (ref >60)
GFR, EST AFRICAN AMERICAN: 44 mL/min — AB (ref >60)
Glucose, Bld: 105 mg/dL — ABNORMAL HIGH (ref 65–99)
Potassium: 4.3 mmol/L (ref 3.5–5.1)
SODIUM: 138 mmol/L (ref 135–145)

## 2016-06-04 SURGERY — CARDIOVERSION
Anesthesia: Monitor Anesthesia Care

## 2016-06-04 MED ORDER — METOPROLOL SUCCINATE ER 50 MG PO TB24
50.0000 mg | ORAL_TABLET | Freq: Every day | ORAL | Status: DC
  Administered 2016-06-05: 50 mg via ORAL
  Filled 2016-06-04: qty 1

## 2016-06-04 MED ORDER — LIDOCAINE VISCOUS 2 % MT SOLN
OROMUCOSAL | Status: AC
  Filled 2016-06-04: qty 15

## 2016-06-04 MED ORDER — LIDOCAINE HCL (CARDIAC) 20 MG/ML IV SOLN
INTRAVENOUS | Status: DC | PRN
  Administered 2016-06-04: 20 mg via INTRATRACHEAL

## 2016-06-04 MED ORDER — PROPOFOL 10 MG/ML IV BOLUS
INTRAVENOUS | Status: DC | PRN
  Administered 2016-06-04: 40 mg via INTRAVENOUS
  Administered 2016-06-04 (×3): 20 mg via INTRAVENOUS

## 2016-06-04 MED ORDER — LACTATED RINGERS IV SOLN
INTRAVENOUS | Status: DC | PRN
  Administered 2016-06-04: 09:00:00 via INTRAVENOUS

## 2016-06-04 MED ORDER — SODIUM CHLORIDE 0.9 % IV SOLN: INTRAVENOUS | Status: DC | PRN

## 2016-06-04 MED ORDER — LIDOCAINE VISCOUS 2 % MT SOLN
OROMUCOSAL | Status: DC | PRN
  Administered 2016-06-04: 1 via OROMUCOSAL

## 2016-06-04 MED ORDER — AMIODARONE HCL 200 MG PO TABS
400.0000 mg | ORAL_TABLET | Freq: Every day | ORAL | Status: DC
  Administered 2016-06-05: 400 mg via ORAL
  Filled 2016-06-04: qty 2

## 2016-06-04 MED ORDER — BUTAMBEN-TETRACAINE-BENZOCAINE 2-2-14 % EX AERO
INHALATION_SPRAY | CUTANEOUS | Status: DC | PRN
  Administered 2016-06-04: 2 via TOPICAL

## 2016-06-04 MED ORDER — KETAMINE HCL 100 MG/ML IJ SOLN
INTRAMUSCULAR | Status: AC
  Filled 2016-06-04: qty 1

## 2016-06-04 NOTE — Anesthesia Preprocedure Evaluation (Addendum)
Anesthesia Evaluation  Patient identified by MRN, date of birth, ID band Patient awake    Reviewed: Allergy & Precautions, H&P , NPO status , Patient's Chart, lab work & pertinent test results, reviewed documented beta blocker date and time   Airway Mallampati: II  TM Distance: >3 FB Neck ROM: full    Dental no notable dental hx.    Pulmonary    Pulmonary exam normal        Cardiovascular Exercise Tolerance: Good hypertension, Normal cardiovascular exam     Neuro/Psych negative neurological ROS     GI/Hepatic negative GI ROS, Neg liver ROS,   Endo/Other  negative endocrine ROS  Renal/GU   negative genitourinary   Musculoskeletal   Abdominal Normal abdominal exam  (+)   Peds  Hematology negative hematology ROS (+)   Anesthesia Other Findings   Reproductive/Obstetrics negative OB ROS                            Anesthesia Physical Anesthesia Plan  ASA: II  Anesthesia Plan: MAC   Post-op Pain Management:    Induction:   Airway Management Planned:   Additional Equipment:   Intra-op Plan:   Post-operative Plan:   Informed Consent: I have reviewed the patients History and Physical, chart, labs and discussed the procedure including the risks, benefits and alternatives for the proposed anesthesia with the patient or authorized representative who has indicated his/her understanding and acceptance.     Plan Discussed with: CRNA and Surgeon  Anesthesia Plan Comments:        Anesthesia Quick Evaluation

## 2016-06-04 NOTE — H&P (Signed)
     INTERVAL PROCEDURE H&P  History and Physical Interval Note:  06/04/2016 8:48 AM  [name] [name] has presented today for their planned procedure. The various methods of treatment have been discussed with the patient and family. After consideration of risks, benefits and other options for treatment, the patient has consented to the procedure.  The patients' outpatient history has been reviewed, patient examined, and no change in status from most recent office note within the past 30 days. I have reviewed the patients' chart and labs and will proceed as planned. Questions were answered to the patient's satisfaction.   Pixie Casino, MD, [facility] Attending Cardiologist [place] [facility] 06/04/2016, 8:48 AM

## 2016-06-04 NOTE — Progress Notes (Signed)
Spoke with [name] [name] Pt son son and explained that his father is refusing to sign the consent and that I spoke with the MD on call and it was suggested that he be available when the MD speaks with him. Suggest that he come to the Hospital by 0700 due to the procedure is scheduled at 0900. Arthor Captain LPN

## 2016-06-04 NOTE — Progress Notes (Signed)
Pt has scheduled TEE with Cardioversion pt has questions about the procedure and will not sign the consent pt did watch video but still has questions. Arthor Captain LPN

## 2016-06-04 NOTE — CV Procedure (Signed)
TEE/CARDIOVERSION NOTE  TRANSESOPHAGEAL ECHOCARDIOGRAM (TEE):  Indictation: Atrial Fibrillation  Consent:   Informed consent was obtained prior to the procedure. The risks, benefits and alternatives for the procedure were discussed and the patient comprehended these risks.  Risks include, but are not limited to, cough, sore throat, vomiting, nausea, somnolence, esophageal and stomach trauma or perforation, bleeding, low blood pressure, aspiration, pneumonia, infection, trauma to the teeth and death.    Time Out: Verified patient identification, verified procedure, site/side was marked, verified correct patient position, special equipment/implants available, medications/allergies/relevent history reviewed, required imaging and test results available. Performed  Procedure:  After a procedural time-out, the patient was given propofol per anesthesia for moderate sedation. The patient's heart rate, blood pressure, and oxygen saturation are monitored continuously during the procedure. The oropharynx was anesthetized 10 cc of topical 1% viscous lidocaine and 2 cetacaine sprays.  The transesophageal probe was inserted in the esophagus and stomach without difficulty and multiple views were obtained. Agitated microbubble saline contrast was administered.  Complications:    Complications: None Patient did tolerate procedure well.  Findings:  1. LEFT VENTRICLE: The left ventricular wall thickness is mildly increased.  The left ventricular cavity is dilated in size. Wall motion is noted to be severely globally hypokinetic.  LVEF is 20-25%.  2. RIGHT VENTRICLE:  The right ventricle is mildly dilated with reduced systolic function without any thrombus or masses.    3. LEFT ATRIUM:  The left atrium is dilated in size without any thrombus or masses.  There is spontaneous echo contrast ("smoke") in the left atrium consistent with a low flow state.  4. LEFT ATRIAL APPENDAGE:  The left atrial  appendage is free of any thrombus or masses. The appendage has single lobes with a chickenwing shape that turns posteriorly. Pulse doppler indicates low flow in the appendage.  5. ATRIAL SEPTUM:  The atrial septum appears intact and is free of thrombus and/or masses.  There is no evidence for interatrial shunting by color doppler and saline microbubble.  6. RIGHT ATRIUM:  The right atrium is normal in size and function without any thrombus or masses.  7. MITRAL VALVE:  The mitral valve is normal in structure and function with Mild regurgitation.  There were no vegetations or stenosis.  8. AORTIC VALVE:  The aortic valve is trileaflet, with thickened leaflets and  Mild regurgitation.  There were no vegetations or stenosis  9. TRICUSPID VALVE:  The tricuspid valve is normal in structure and function with Mild regurgitation.  There were no vegetations or stenosis  10.  PULMONIC VALVE:  The pulmonic valve is normal in structure and function with mild to moderate regurgitation.  There were no vegetations or stenosis.   11. AORTIC ARCH, ASCENDING AND DESCENDING AORTA:  There was grade 2 Ron Parker et. Al, 1992) atherosclerosis of the ascending aorta, aortic arch, or proximal descending aorta.  12. PULMONARY VEINS: Anomalous pulmonary venous return was not noted.  13. PERICARDIUM: The pericardium appeared normal and non-thickened.  There is no pericardial effusion.  CARDIOVERSION:     Second Time Out: Verified patient identification, verified procedure, site/side was marked, verified correct patient position, special equipment/implants available, medications/allergies/relevent history reviewed, required imaging and test results available.  Performed  Procedure:  1. Patient placed on cardiac monitor, pulse oximetry, supplemental oxygen as necessary.  2. Sedation administered per anesthesia 3. Pacer pads placed anterior and posterior chest. 4. Cardioverted 1 time(s).  5. Cardioverted at 150J  biphasic.  Complications:  Complications: None  Patient did tolerate procedure well.  Impression:  1. No LAA thrombus - chickenwing appendage. 2. Negative for PFO 3. Mild MR, AI, TR and mild to moderate PI 4. Aortic valve sclerosis 5. Severe global hypokinesis - LVEF 20-25% 6. Successful DCCV with a single 150J biphasic shock to sinus bradycardia  Recommendations:  1. Discontinue IV amiodarone. Start po amiodarone 400 mg daily tomorrow. 2. Hold Toprol XL today and restart tomorrow at reduced dose of 50 mg daily if HR >50. 3. Continue Eliquis 2.5 mg BID (he is due for a 10:00 am dose today) 4. Right forearm IV discontinued - there appears to be swelling, ecchymosis and edema of that arm. Elevated and warm compresses are recommended. 5. May need short term rehabilitation stay - son says no one is at home with the patient to assist him.  Time Spent Directly with the Patient:  45 minutes   Pixie Casino, MD, [facility] Attending Cardiologist [facility]  06/04/2016, 9:56 AM

## 2016-06-04 NOTE — Progress Notes (Signed)
Progress Note    [name] [name]  [id number] DOB: 08-Dec-1919  DOA: 05/28/2016 PCP: Walker Kehr, MD    Brief Narrative:   [name] [name] is an 82 y.o. male with PMH HTN, HLD, systolic CHF, presumed CAD and chronic bradycardia who Was admitted 05/28/16 with syncope. He was found to be in afib/flutter on EKG and CT showed no intracranial abnormalities in the ED. He was started on Eliquis and Diltiazem drip which has controlled his rate. Echo showed EF 20% with diffuse left ventricle hypokinesia. Diltiazem drip was switched to Toprol XL 75mg  for treatment of Afib and CHF. At times he has converted to NSR and now he appears to be in SVT. TEE/DCCV scheduled for Tues 06/04/16 at 9am with Dr. Debara Pickett.   Assessment/Plan:   Principal Problem:   Syncope and collapse In the setting of rapid atrial flutter/Persistent atrial fibrillation [facility]) Being followed by cardiology. Initially placed on IV diltiazem which was weaned and patient is now on Toprol-XL. Heart rate better controlled, but still suboptimal. Continue amiodarone. For TEE/cardioversion 06/04/16. Not a candidate for long-term anticoagulation secondary to high fall risk. Currently on Eliquis. Underwent DC cardioversion today. Likely can be discharged tomorrow if stable and can ambulate per cardiology.  Active Problems:   ANXIETY DEPRESSION Continue Xanax.    Essential hypertension BP Stable.  Continue lasix and metoprolol.  Hyzaar on hold.    Scalp laceration/contusion right forearm/abrasions to multiple sites secondary to fall Continue local wound care.    CKD (chronic kidney disease) stage 3, GFR 30-59 ml/min Baseline creatinine appears to be around 1.6-1.7. Creatinine consistent with usual baseline values. Blood pressure Stable but would not start ACE-I/ARB until creatinine stable, creatinine up today.    Acute hyperkalemia Resolved.    Thrombocytopenia (CHRONIC) Mild, chronic.    Demand ischemia/Elevated troponin Troponin trend  flat. Occurred in the setting of rapid A. fib/flutter.    Congestive dilated cardiomyopathy (HCC)/chronic systolic CHF Echo showed diffuse hypokinesia and LVEF 20-25%. (05/29/16) Most likely Tachycardia mediated. Clinically he has remained euvolemic. Continue Metoprolol 75mg , lasix. Consider starting low dose ACE-I/ARB his kidney function improved from baseline.   Family Communication/Anticipated D/C date and plan/Code Status   DVT prophylaxis: Lovenox ordered. Code Status: Full Code.  Family Communication: Son updated at the Bedside 06/03/16. Disposition Plan: Home after cardioversion, likely tomorrow.   Medical Consultants:    Cardiology   Procedures:   Laceration repair 05/28/16  2-D echo 05/29/16: Severe LV systolic impairment and moderate RV systolic impairment. EF 20-25 percent with diffuse hypokinesis.  Anti-Infectives:   None.  Subjective:   [name] [name] feels well and is without complaints. Anxious for discharge. Reiterates that he is not interested in rehabilitation at the present time, and feels that his son can do physical therapy with him.  Objective:    Filed Vitals:   06/04/16 0955 06/04/16 1000 06/04/16 1010 06/04/16 1043  BP: 103/63 107/47 108/67 119/65  Pulse: 44 45 46   Temp:      TempSrc:      Resp: 22 23 17    Height:      Weight:      SpO2: 99% 97% 100% 100%    Intake/Output Summary (Last 24 hours) at 06/04/16 1653 Last data filed at 06/04/16 1300  Gross per 24 hour  Intake    640 ml  Output   1100 ml  Net   -460 ml   Filed Weights   06/03/16 0515 06/04/16 0513 06/04/16 0831  Weight: 84.732  kg (186 lb 12.8 oz) 84.46 kg (186 lb 3.2 oz) 84.46 kg (186 lb 3.2 oz)    Exam: General exam: Appears calm and comfortable.  Respiratory system: Clear to auscultation. Respiratory effort normal. Cardiovascular system: S1 & S2 heard, RRR. No JVD,  rubs, gallops or clicks. No murmurs. Gastrointestinal system: Abdomen is nondistended, soft and  nontender. No organomegaly or masses felt. Normal bowel sounds heard. Central nervous system: Alert and oriented. No focal neurological deficits. Extremities: No clubbing, edema, or cyanosis. Skin: Large ecchymosis right upper and lower arm. Psychiatry: Judgement and insight appear normal. Mood & affect appropriate.   Data Reviewed:   I have personally reviewed following labs and imaging studies:  Labs: Basic Metabolic Panel:  Recent Labs Lab 05/30/16 0427 05/31/16 0255 06/02/16 0213 06/03/16 1012 06/04/16 0520  NA 138 137 137 138 138  K 4.5 4.3 4.1 4.1 4.3  CL 108 105 107 106 106  CO2 23 24 24 27 26   GLUCOSE 95 97 102* 102* 105*  BUN 39* 32* 34* 32* 31*  CREATININE 1.38* 1.23 1.56* 1.43* 1.50*  CALCIUM 7.8* 8.1* 8.3* 8.5* 8.5*  PHOS 3.5  --   --   --   --    GFR Estimated Creatinine Clearance: 31.9 mL/min (by C-G formula based on Cr of 1.5). Liver Function Tests:  Recent Labs Lab 05/30/16 0427  ALBUMIN 2.8*   CBC:  Recent Labs Lab 06/02/16 0213  WBC 6.0  HGB 13.3  HCT 40.1  MCV 95.7  PLT 115*   Cardiac Enzymes:  Recent Labs Lab 05/28/16 1945 05/29/16 0129  TROPONINI 0.06* 0.06*   CBG:  Recent Labs Lab 05/30/16 0501 05/31/16 0637 06/01/16 0545 06/02/16 0633 06/03/16 0601  GLUCAP 90 97 106* 110* 104*   Microbiology No results found for this or any previous visit (from the past 240 hour(s)).  Radiology: No results found.  Medications:   . ALPRAZolam  1 mg Oral QHS  . [START ON 06/05/2016] amiodarone  400 mg Oral Daily  . apixaban  2.5 mg Oral BID  . famotidine  10 mg Oral BID  . [START ON 06/05/2016] metoprolol succinate  50 mg Oral Daily  . sodium chloride flush  3 mL Intravenous Q12H   Continuous Infusions: . sodium chloride 20 mL/hr (05/28/16 1224)    Time spent: 25 minutes.   LOS: 4 days   [facility] Pager [phone]. If unable to reach me by pager, please call my cell phone at [phone].  *Please  refer to [email or web address], password [email or web address] to get updated schedule on who will round on this patient, as hospitalists switch teams weekly. If 7PM-7AM, please contact night-coverage at [email or web address], password [email or web address] for any overnight needs.  06/04/2016, 4:53 PM

## 2016-06-04 NOTE — Addendum Note (Signed)
Addendum  created 06/04/16 0956 by Neldon Newport, CRNA   Modules edited: Anesthesia Medication Administration

## 2016-06-04 NOTE — Progress Notes (Signed)
    SUBJECTIVE:  No chest pain or SOB.  Cardioverted today.     PHYSICAL EXAM Filed Vitals:   06/04/16 0955 06/04/16 1000 06/04/16 1010 06/04/16 1043  BP: 103/63 107/47 108/67 119/65  Pulse: 44 45 46   Temp:      TempSrc:      Resp: 22 23 17    Height:      Weight:      SpO2: 99% 97% 100% 100%   General:  No distress Lungs:  Clear Heart:  Regular Abdomen:  Positive bowel sounds, no rebound no guarding Extremities:  No edema   LABS:  Results for orders placed or performed during the hospital encounter of 05/28/16 (from the past 24 hour(s))  Basic metabolic panel     Status: Abnormal   Collection Time: 06/04/16  5:20 AM  Result Value Ref Range   Sodium 138 135 - 145 mmol/L   Potassium 4.3 3.5 - 5.1 mmol/L   Chloride 106 101 - 111 mmol/L   CO2 26 22 - 32 mmol/L   Glucose, Bld 105 (H) 65 - 99 mg/dL   BUN 31 (H) 6 - 20 mg/dL   Creatinine, Ser 1.50 (H) 0.61 - 1.24 mg/dL   Calcium 8.5 (L) 8.9 - 10.3 mg/dL   GFR calc non Af Amer 38 (L) >60 mL/min   GFR calc Af Amer 44 (L) >60 mL/min   Anion gap 6 5 - 15    Intake/Output Summary (Last 24 hours) at 06/04/16 1103 Last data filed at 06/04/16 0930  Gross per 24 hour  Intake    520 ml  Output   1400 ml  Net   -880 ml    ASSESSMENT AND PLAN:  SYNCOPE:  He has had previous falls.  This is is first syncope that I recall.  I will follow closely as an out patient for any further symptoms or events.   NEW ONSET ATRIAL FIB:  On Eliquis 2.5 bid.  Status post cardioversion.  Amio stopped.    CARDIOMYOPATHY:  Felt to be tachy mediated.   Creat is creeping stable.  Keep I/O even. BNP was elevated.  Will avoid adding an ACE inhibitor or ARB for now.    DISPOSITION: I had a long talk with him about this.  He absolutely does not want to go to rehab or anywhere other than home.  I does need to demonstrate that he can ambulate before he goes home.  I will ask nursing to ambulate him so that we see how he does in anticipation of discharge  in the AM.    Minus Breeding 06/04/2016 11:03 AM

## 2016-06-04 NOTE — Anesthesia Postprocedure Evaluation (Signed)
Anesthesia Post Note  Patient: Elim Piel  Procedure(s) Performed: Procedure(s) (LRB): CARDIOVERSION (N/A) TRANSESOPHAGEAL ECHOCARDIOGRAM (TEE) (N/A)  Patient location during evaluation: PACU Anesthesia Type: MAC Level of consciousness: sedated Pain management: pain level controlled Vital Signs Assessment: post-procedure vital signs reviewed and stable Respiratory status: spontaneous breathing Cardiovascular status: stable Postop Assessment: no signs of nausea or vomiting Anesthetic complications: no     Last Vitals:  Filed Vitals:   06/04/16 0513 06/04/16 0831  BP: 125/91 143/94  Pulse: 112 119  Temp: 36.7 C 36.6 C  Resp: 18 22    Last Pain:  Filed Vitals:   06/04/16 0833  PainSc: 0-No pain   Pain Goal:                 Elease Swarm JR,JOHN [facility] [employer]

## 2016-06-04 NOTE — Progress Notes (Signed)
*  PRELIMINARY RESULTS* Echocardiogram Echocardiogram Transesophageal has been performed.  [name] [name] 06/04/2016, 10:02 AM

## 2016-06-04 NOTE — Transfer of Care (Signed)
Immediate Anesthesia Transfer of Care Note  Patient: [name] [name]  Procedure(s) Performed: Procedure(s): CARDIOVERSION (N/A) TRANSESOPHAGEAL ECHOCARDIOGRAM (TEE) (N/A)  Patient Location: Endoscopy Unit  Anesthesia Type:MAC  Level of Consciousness: awake, alert  and oriented  Airway & Oxygen Therapy: Patient Spontanous Breathing and Patient connected to nasal cannula oxygen  Post-op Assessment: Report given to RN, Post -op Vital signs reviewed and stable and Patient moving all extremities X 4  Post vital signs: Reviewed and stable  Last Vitals:  Filed Vitals:   06/04/16 0513 06/04/16 0831  BP: 125/91 143/94  Pulse: 112 119  Temp: 36.7 C 36.6 C  Resp: 18 22    Last Pain:  Filed Vitals:   06/04/16 0833  PainSc: 0-No pain         Complications: No apparent anesthesia complications

## 2016-06-04 NOTE — Progress Notes (Signed)
Spoke with the Cardiologist and informed that pt is refusing to sign the consent for TEE with Cardioversion stating that He did not understand the procedure and he has question. I informed that saw the video and still has questions and will not sign. It was suggested to call the son and ask him to be present when MD speaks with him. Arthor Captain LPN

## 2016-06-05 ENCOUNTER — Encounter (HOSPITAL_COMMUNITY): Payer: Self-pay | Admitting: Internal Medicine

## 2016-06-05 LAB — GLUCOSE, CAPILLARY: Glucose-Capillary: 99 mg/dL (ref 65–99)

## 2016-06-05 MED ORDER — APIXABAN 2.5 MG PO TABS: 2.5000 mg | ORAL_TABLET | Freq: Two times a day (BID) | ORAL | Status: DC

## 2016-06-05 MED ORDER — AMIODARONE HCL 400 MG PO TABS: 400.0000 mg | ORAL_TABLET | Freq: Every day | ORAL | Status: DC

## 2016-06-05 NOTE — Care Management Note (Addendum)
Case Management Note  Patient Details  Name: [name] [name] MRN: [id number] Date of Birth: 1920/04/05  Subjective/Objective:   Fall, syncope                 Action/Plan: Discharge Planning: NCM spoke to pt and states he lives at home with a friend, [employer]. She will be living with him in his condo. Pt has RW and rollator at home. States he still mows his lawn. States he is going to let son, [name] [name] # [phone] mow lawn. Pt verbalized understanding to be more careful at home to avoid falls and over exertion. He can afford his medications at home. No NCM needs post dc.  PCP [name] Anger MD    Expected Discharge Date:  06/05/2016             Expected Discharge Plan:  Home/Self Care  In-House Referral:  NA  Discharge planning Services  CM Consult  Post Acute Care Choice:  NA Choice offered to:  NA  DME Arranged:  N/A DME Agency:  NA  HH Arranged:  NA HH Agency:  NA  Status of Service:  Completed, signed off  If discussed at [place] of Stay Meetings, dates discussed:    Additional Comments:  Erenest Rasher, RN 06/05/2016, 11:51 AM

## 2016-06-05 NOTE — Care Management Important Message (Signed)
Important Message  Patient Details  Name: [name] [name] MRN: [id number] Date of Birth: 1920-04-19   Medicare Important Message Given:  Yes    Loann Quill 06/05/2016, 10:05 AM

## 2016-06-05 NOTE — Discharge Summary (Signed)
[name] [name], is a 82 y.o. male  DOB 1920/04/02  MRN [id number].  Admission date:  05/28/2016  Admitting Physician  Waldemar Dickens, MD  Discharge Date:  06/05/2016   Primary MD  Walker Kehr, MD  Recommendations for primary care physician for things to follow:  - Please check CBC, BMP during next visit. - Instructed to follow with PCP for scalp suture removal in 5-7 days - Patient on Amioaron, to be tapered by his primary cardiologist, to follow with him in 3-4 days   Admission Diagnosis  Pain in joint, shoulder region [M25.519] Contusion of head, initial encounter [S00.93XA] Laceration of scalp, initial encounter [S01.01XA] Abrasion, multiple sites [T14.8] Syncope, unspecified syncope type [R55]   Discharge Diagnosis  Pain in joint, shoulder region [M25.519] Contusion of head, initial encounter [S00.93XA] Laceration of scalp, initial encounter [S01.01XA] Abrasion, multiple sites [T14.8] Syncope, unspecified syncope type [R55]    Principal Problem:   Syncope and collapse Active Problems:   ANXIETY DEPRESSION   Essential hypertension   Scalp laceration   History of sinus bradycardia   Contusion of right forearm   CKD (chronic kidney disease) stage 3, GFR 30-59 ml/min   Acute hyperkalemia   Thrombocytopenia (CHRONIC)   Contusion of head   Elevated troponin   Abrasion, multiple sites   Atypical atrial flutter (HCC)   Persistent atrial fibrillation (HCC)   Faintness   Congestive dilated cardiomyopathy (HCC)   Demand ischemia (HCC)   Systolic CHF, chronic (HCC)   PAF (paroxysmal atrial fibrillation) ([place])      Past Medical History  Diagnosis Date  . Hypertension   . Hyperlipidemia   . Coronary artery disease   . Cardiomyopathy   . Hemorrhoids   . Anxiety   . Skipped heart beats   . Arthritis     "shoulders" (05/28/2016)  . Depression     hx (05/28/2016)    Past Surgical  History  Procedure Laterality Date  . Colonoscopy w/ biopsies and polypectomy    . Cholecystectomy open    . Appendectomy    . Tonsillectomy and adenoidectomy    . Myringotomy with tube placement Bilateral   . Inguinal hernia repair Bilateral   . Cataract extraction Left   . Cardioversion N/A 06/04/2016    Procedure: CARDIOVERSION;  Surgeon: Pixie Casino, MD;  Location: [facility] ENDOSCOPY;  Service: Cardiovascular;  Laterality: N/A;  . Tee without cardioversion N/A 06/04/2016    Procedure: TRANSESOPHAGEAL ECHOCARDIOGRAM (TEE);  Surgeon: Pixie Casino, MD;  Location: [facility] ENDOSCOPY;  Service: Cardiovascular;  Laterality: N/A;       History of present illness and  Hospital Course:     Kindly see H&P for history of present illness and admission details, please review complete Labs, Consult reports and Test reports for all details in brief  HPI  from the history and physical done on the day of admission 05/28/2016  HPI: [name] [name] is a 82 y.o. male with medical history significant for hypertension, prior bradycardia, known CAD medically managed, BPH and  erectile dysfunction, anxiety and depression, CKD 3, and osteoarthritis who was sent to the ER from his primary care physician's office after experiencing a syncopal episode with apparent loss of consciousness. The patient reported that he fell after becoming dizzy while mowing today. He attempted to at least get the lawnmower that towards the home and made it as far as the sidewalk. The next thing the patient members he was waking up on the sidewalk with extensive bleeding. He went to his PCP in an attempt to avoid possibly needing to come to the ER but due to the syncopal episode and extensive scalp bleeding he was sent to the ER for further evaluation and treatment. Patient reports prior to the syncopal episode he was not experiencing chest pain, shortness of breath or palpitations. He has not been sick recently. He did take his normal medications  including his diuretics prior to working outside today.  ED Course:  Vital signs: PO temp 97.8-BP 105/90-pulse 119-respirations 22-room air saturations 97% CT head without contrast: No acute intercranial hemorrhage, no midline shift or mass effect, soft tissue swelling in the vertex of the scalp frontal region without associated fracture Laboratory data: Sodium 136, potassium 5.6, BUN 38, creatinine 1.7, anion gap 8, WBC 7900 differential not obtained, hemoglobin 15.2, platelets 125,000, glucose 95, urinalysis unremarkable Medications and treatments: Normal saline at [facility], lidocaine intradermal for laceration repair, Tdap intramuscular 1 dose   Hospital Course   [name] [name] is an 82 y.o. male with PMH HTN, HLD, systolic CHF, presumed CAD and chronic bradycardia who Was admitted 05/28/16 with syncope. He was found to be in afib/flutter on EKG and CT showed no intracranial abnormalities in the ED. He was started on Eliquis and Diltiazem drip which has controlled his rate. Echo showed EF 20% with diffuse left ventricle hypokinesia. Diltiazem drip was switched to Toprol XL 75mg  for treatment of Afib and CHF. At times he has converted to NSR and now he appears to be in SVT. S/p TEE/DCCV on  06/04/16    Syncope and collapse In the setting of rapid atrial flutter/Persistent atrial fibrillation [facility]) Being followed by cardiology. Initially placed on IV diltiazem which was weaned and patient is now on Toprol-XL. Heart rate better controlled, but still suboptimal. Continue amiodarone.  Currently on Eliquis. Underwent DC cardioversion 06/04/2016.   Active Problems:  ANXIETY DEPRESSION Continue Xanax.   Essential hypertension BP Stable. Continue lasix and metoprolol. Hyzaar stopped discharge   Scalp laceration/contusion right forearm/abrasions to multiple sites secondary to fall Continue local wound care.   CKD (chronic kidney disease) stage 3, GFR 30-59 ml/min Baseline creatinine appears to be  around 1.6-1.7. Creatinine consistent with usual baseline values. Blood pressure Stable but would not start ACE-I/ARB until creatinine stable,    Acute hyperkalemia Resolved.   Thrombocytopenia (CHRONIC) Mild, chronic.   Demand ischemia/Elevated troponin Troponin trend flat. Occurred in the setting of rapid A. fib/flutter.   Congestive dilated cardiomyopathy (HCC)/chronic systolic CHF Echo showed diffuse hypokinesia and LVEF 20-25%. (05/29/16) Most likely Tachycardia mediated. Clinically he has remained euvolemic. Continue Metoprolol . Consider starting low dose ACE-I/ARB as outpatient, continue to hold Hyzaar per cardiology recommendation on discharge.  Discharge Condition: stable   Follow UP  Follow-up Information    Follow up with Walker Kehr, MD. Schedule an appointment as soon as possible for a visit in 5 days.   Specialty:  Internal Medicine   Why:  for sutures Removal   Contact information:   [place]  [phone]         Discharge Instructions  and  Discharge Medications    Discharge Instructions    Diet - low sodium heart healthy    Complete by:  As directed      Discharge instructions    Complete by:  As directed   Follow with Primary MD Walker Kehr, MD in 7 days   Get CBC, CMP,checked  by Primary MD next visit.    Activity: As tolerated with Full fall precautions use walker/cane & assistance as needed   Disposition Home    Diet: Heart Healthy  , with feeding assistance and aspiration precautions.  For Heart failure patients - Check your Weight same time everyday, if you gain over 2 pounds, or you develop in leg swelling, experience more shortness of breath or chest pain, call your Primary MD immediately. Follow Cardiac Low Salt Diet and 1.5 lit/day fluid restriction.   On your next visit with your primary care physician please Get Medicines reviewed and adjusted.   Please request your Prim.MD to go over all  Hospital Tests and Procedure/Radiological results at the follow up, please get all Hospital records sent to your Prim MD by signing hospital release before you go home.   If you experience worsening of your admission symptoms, develop shortness of breath, life threatening emergency, suicidal or homicidal thoughts you must seek medical attention immediately by calling 911 or calling your MD immediately  if symptoms less severe.  You Must read complete instructions/literature along with all the possible adverse reactions/side effects for all the Medicines you take and that have been prescribed to you. Take any new Medicines after you have completely understood and accpet all the possible adverse reactions/side effects.   Do not drive, operating heavy machinery, perform activities at heights, swimming or participation in water activities or provide baby sitting services if your were admitted for syncope or siezures until you have seen by Primary MD or a Neurologist and advised to do so again.  Do not drive when taking Pain medications.    Do not take more than prescribed Pain, Sleep and Anxiety Medications  Special Instructions: If you have smoked or chewed Tobacco  in the last 2 yrs please stop smoking, stop any regular Alcohol  and or any Recreational drug use.  Wear Seat belts while driving.   Please note  You were cared for by a hospitalist during your hospital stay. If you have any questions about your discharge medications or the care you received while you were in the hospital after you are discharged, you can call the unit and asked to speak with the hospitalist on call if the hospitalist that took care of you is not available. Once you are discharged, your primary care physician will handle any further medical issues. Please note that NO REFILLS for any discharge medications will be authorized once you are discharged, as it is imperative that you return to your primary care physician (or  establish a relationship with a primary care physician if you do not have one) for your aftercare needs so that they can reassess your need for medications and monitor your lab values.     Increase activity slowly    Complete by:  As directed             Medication List    STOP taking these medications        aspirin EC 81 MG tablet     losartan-hydrochlorothiazide 50-12.5 MG tablet  Commonly known as:  HYZAAR      TAKE these medications        ALPRAZolam 1 MG tablet  Commonly known as:  XANAX  Take 1 tablet (1 mg total) by mouth at bedtime.     amiodarone 400 MG tablet  Commonly known as:  PACERONE  Take 1 tablet (400 mg total) by mouth daily.     apixaban 2.5 MG Tabs tablet  Commonly known as:  ELIQUIS  Take 1 tablet (2.5 mg total) by mouth 2 (two) times daily.     furosemide 20 MG tablet  Commonly known as:  LASIX  Take 1 tablet (20 mg total) by mouth daily.     metoprolol succinate 50 MG 24 hr tablet  Commonly known as:  TOPROL-XL  Take 1 tablet (50 mg total) by mouth daily.     naphazoline-pheniramine 0.025-0.3 % ophthalmic solution  Commonly known as:  NAPHCON-A  Place 1 drop into both eyes daily as needed for irritation.     psyllium 58.6 % packet  Commonly known as:  METAMUCIL  Take 1 packet by mouth daily.     ranitidine 150 MG tablet  Commonly known as:  ZANTAC  TAKE 1 TABLET TWICE DAILY.     sildenafil 20 MG tablet  Commonly known as:  REVATIO  Take 1-3 tablets (20-60 mg total) by mouth daily as needed.     simvastatin 40 MG tablet  Commonly known as:  ZOCOR  Take 20 mg by mouth at bedtime.          Diet and Activity recommendation: See Discharge Instructions above   Consults obtained -  cardiology   Major procedures and Radiology Reports - PLEASE review detailed and final reports for all details, in brief - TEE and cardioversion on 7/18   Ct Head Wo Contrast  05/28/2016  CLINICAL DATA:  82 year old male with a history of fall  EXAM: CT HEAD WITHOUT CONTRAST TECHNIQUE: Contiguous axial images were obtained from the base of the skull through the vertex without intravenous contrast. COMPARISON:  None. FINDINGS: Unremarkable appearance of the calvarium without acute fracture or aggressive lesion. Soft tissue swelling with disruption of the soft tissues at the frontal vertex, new from the comparison CT. In the interval, the prior soft tissue swelling in the left supraorbital scalp has resolved. No radiopaque foreign body. Unremarkable appearance of the bilateral orbits. Mastoid air cells are clear. No significant paranasal sinus disease No acute hemorrhage. No midline shift or mass effect. Gray-white differentiation maintained. Configuration the ventricles unchanged. Senescent calcifications of the basal ganglia. Intracranial atherosclerosis. IMPRESSION: No CT evidence of acute intracranial abnormality. Soft tissue swelling in the vertex of the scalp, frontal region, without associated fracture. Signed, Dulcy Fanny. Earleen Newport, DO [facility] Radiology Electronically Signed   By: Corrie Mckusick D.O.   On: 05/28/2016 11:56    Micro Results     No results found for this or any previous visit (from the past 240 hour(s)).     Today   Subjective:   [name] [name] today has no headache,no chest abdominal pain,no new weakness tingling or numbness, feels much better wants to go home today.  Objective:   Blood pressure 111/47, pulse 62, temperature 97.7 F (36.5 C), temperature source Oral, resp. rate 18, height 5\' 8"  (1.727 m), weight 85.186 kg (187 lb 12.8 oz), SpO2 62 %.   Intake/Output Summary (Last 24 hours) at 06/05/16 1328 Last data filed at 06/05/16 0900  Gross  per 24 hour  Intake    480 ml  Output    525 ml  Net    -45 ml    Exam General exam: Appears calm and comfortable.  Respiratory system: Clear to auscultation. Respiratory effort normal. Cardiovascular system: S1 & S2  heard, RRR. No JVD, rubs, gallops or clicks. No murmurs. Gastrointestinal system: Abdomen is nondistended, soft and nontender. No organomegaly or masses felt. Normal bowel sounds heard. Central nervous system: Alert and oriented. No focal neurological deficits. Extremities: No clubbing, edema, or cyanosis. Skin: Large ecchymosis right upper and lower arm. Psychiatry: Judgement and insight appear normal. Mood & affect appropriate.  Data Review   CBC w Diff: Lab Results  Component Value Date   WBC 6.0 06/02/2016   HGB 13.3 06/02/2016   HCT 40.1 06/02/2016   PLT 115* 06/02/2016   LYMPHOPCT 21.5 12/14/2015   MONOPCT 10.7 12/14/2015   EOSPCT 2.7 12/14/2015   BASOPCT 0.4 12/14/2015    CMP: Lab Results  Component Value Date   NA 138 06/04/2016   K 4.3 06/04/2016   CL 106 06/04/2016   CO2 26 06/04/2016   BUN 31* 06/04/2016   CREATININE 1.50* 06/04/2016   PROT 7.0 12/14/2015   ALBUMIN 2.8* 05/30/2016   BILITOT 0.8 12/14/2015   ALKPHOS 113 12/14/2015   AST 17 12/14/2015   ALT 10 12/14/2015  .   Total Time in preparing paper work, data evaluation and todays exam - 35 minutes  [name] [name] M.D on 06/05/2016 at 1:28 PM  [facility]   Office  [phone]

## 2016-06-05 NOTE — Progress Notes (Signed)
    SUBJECTIVE:  No chest pain or SOB.   Wants to go home.    PHYSICAL EXAM Filed Vitals:   06/04/16 1043 06/04/16 2156 06/05/16 0555 06/05/16 0558  BP: 119/65 139/64 112/57   Pulse:  61 54   Temp:  98 F (36.7 C) 97.6 F (36.4 C)   TempSrc:  Oral Oral   Resp:  18 18   Height:      Weight:    187 lb 12.8 oz (85.186 kg)  SpO2: 100% 98% 95%    General:  No distress Lungs:  Clear Heart:  Regular Abdomen:  Positive bowel sounds, no rebound no guarding Extremities:  No edema   LABS:  Results for orders placed or performed during the hospital encounter of 05/28/16 (from the past 24 hour(s))  Glucose, capillary     Status: None   Collection Time: 06/05/16  5:52 AM  Result Value Ref Range   Glucose-Capillary 99 65 - 99 mg/dL   Comment 1 Notify RN    Comment 2 Document in Chart     Intake/Output Summary (Last 24 hours) at 06/05/16 0836 Last data filed at 06/05/16 0600  Gross per 24 hour  Intake    640 ml  Output    650 ml  Net    -10 ml    ASSESSMENT AND PLAN:  SYNCOPE:  He has had previous falls.  This is is first syncope that I recall.  I will follow closely as an out patient for any further symptoms or events.   NEW ONSET ATRIAL FIB:  On Eliquis 2.5 bid.  Status post cardioversion.  Amio stopped.    CARDIOMYOPATHY:  Felt to be tachy mediated.    Will avoid adding an ACE inhibitor or ARB for now.    DISPOSITION:   OK to go home.  He ambulated yesterday with nursing.   He is to keep follow up with me on the 24th.      Minus Breeding 06/05/2016 8:36 AM

## 2016-06-05 NOTE — Evaluation (Signed)
Physical Therapy Evaluation Patient Details Name: [name] [name] MRN: [id number] DOB: 09/25/1920 Today's Date: 06/05/2016   History of Present Illness  Pt adm after syncopal episode resulting in fall. Pt found to have rapid atrial flutter/Persistent atrial fibrillation. PMH - chf, ckd, htn  Clinical Impression  Pt doing well with mobility and no further PT needed.  Ready for dc from PT standpoint. Pt typically doesn't use a walker but currently feels more secure with one. Has one at home.      Follow Up Recommendations No PT follow up    Equipment Recommendations  None recommended by PT    Recommendations for Other Services       Precautions / Restrictions Precautions Precautions: Fall      Mobility  Bed Mobility Overal bed mobility: Modified Independent                Transfers Overall transfer level: Modified independent Equipment used: Rolling walker (2 wheeled)                Ambulation/Gait Ambulation/Gait assistance: Modified independent (Device/Increase time) Ambulation Distance (Feet): 350 Feet Assistive device: Rolling walker (2 wheeled) Gait Pattern/deviations: Step-through pattern;Decreased stride length     General Gait Details: Steady gait with walker  Stairs            Wheelchair Mobility    Modified Rankin (Stroke Patients Only)       Balance Overall balance assessment: History of Falls                                           Pertinent Vitals/Pain Pain Assessment: No/denies pain    Home Living Family/patient expects to be discharged to:: Private residence Living Arrangements: Alone             Home Equipment: Walker - 2 wheels Additional Comments: Pt has lots of equipment that his wife used before her death.    Prior Function Level of Independence: Independent         Comments: Still drives     Hand Dominance        Extremity/Trunk Assessment   Upper Extremity Assessment: Overall  WFL for tasks assessed           Lower Extremity Assessment: Overall WFL for tasks assessed         Communication   Communication: No difficulties  Cognition Arousal/Alertness: Awake/alert Behavior During Therapy: WFL for tasks assessed/performed Overall Cognitive Status: Within Functional Limits for tasks assessed                      General Comments      Exercises        Assessment/Plan    PT Assessment Patent does not need any further PT services  PT Diagnosis Difficulty walking   PT Problem List    PT Treatment Interventions     PT Goals (Current goals can be found in the Care Plan section) Acute Rehab PT Goals PT Goal Formulation: All assessment and education complete, DC therapy    Frequency     Barriers to discharge        Co-evaluation               End of Session   Activity Tolerance: Patient tolerated treatment well Patient left: in bed;with call bell/phone within reach  Time: [id number] PT Time Calculation (min) (ACUTE ONLY): 12 min   Charges:   PT Evaluation $PT Eval Low Complexity: 1 Procedure     PT G Codes:        Candas Deemer 06/14/16, 11:49 AM Suanne Marker PT [phone]

## 2016-06-07 ENCOUNTER — Ambulatory Visit (INDEPENDENT_AMBULATORY_CARE_PROVIDER_SITE_OTHER): Payer: Medicare Other | Admitting: Family

## 2016-06-07 VITALS — BP 122/72 | HR 53 | Temp 97.8°F | Wt 192.4 lb

## 2016-06-07 DIAGNOSIS — S40021D Contusion of right upper arm, subsequent encounter: Secondary | ICD-10-CM

## 2016-06-07 DIAGNOSIS — W19XXXD Unspecified fall, subsequent encounter: Secondary | ICD-10-CM

## 2016-06-07 DIAGNOSIS — S0191XD Laceration without foreign body of unspecified part of head, subsequent encounter: Secondary | ICD-10-CM

## 2016-06-07 DIAGNOSIS — S0191XA Laceration without foreign body of unspecified part of head, initial encounter: Secondary | ICD-10-CM | POA: Insufficient documentation

## 2016-06-07 DIAGNOSIS — S40021A Contusion of right upper arm, initial encounter: Secondary | ICD-10-CM | POA: Insufficient documentation

## 2016-06-07 DIAGNOSIS — R55 Syncope and collapse: Secondary | ICD-10-CM | POA: Diagnosis not present

## 2016-06-07 NOTE — Assessment & Plan Note (Signed)
Traumatic hematoma of the right arm appears to be healing well as discoloration is more distal consistent with being in a gravity dependent position and no significant distal findings. Recommend continued conservative treatment with elevation and hand squeezing exercises. Continue with ice for inflammation and discomfort. Tylenol as needed for pain. This will most likely heal slow secondary to use of blood thinners. Continue to monitor and follow up if symptoms do not improve.

## 2016-06-07 NOTE — Progress Notes (Signed)
Subjective:    Patient ID: [name] [name], male    DOB: December 27, 1919, 82 y.o.   MRN: [id number]  Chief Complaint  Patient presents with  . Arm Injury  . Hospitalization Follow-up    HPI:  [name] [name] is a 82 y.o. male who  has a past medical history of Hypertension; Hyperlipidemia; Coronary artery disease; Cardiomyopathy; Hemorrhoids; Anxiety; Skipped heart beats; Arthritis; and Depression. and presents today For an office visit.  Recently evaluated in the office and sent to the emergency room following a syncopal episode with apparent loss of consciousness. Patient reported he felt dizzy while mowing the yard and the next thing he remembers was waking up on the sidewalk with extensive bleeding. In the emergency room there was no evidence of chest pain, shortness of breath, or heart palpitations. There is no recent sickness. Physical exam found a 4 cm laceration to the scalp which was repaired with 7 sutures. EKG showed sinus tachycardia with right bundle branch block. CT of the head negative for acute intracranial abnormalities as he is on Eliquis. He was diagnosed with syncope and collapse in the setting of rapid atrial fibrillation for which he was cardioverted on 06/04/2016. He was discharged with improved condition instructed to follow-up with PCP for scalp sutural removal and with cardiology to be tapered down on amiodarone.  All hospital records, imaging, and labs were reviewed in detail.  Since leaving the hospital he reports that he has been doing well. Notes significant swelling and discoloration of his right arm where he landed during the fall. Denies any headaches, confusion, or dizziness. The laceration on his head has been kept clean, dry, and intact. Denies fevers. He is returning to his baseline activity. Does note some increased pressure in his hand when attempting to grab things. Denies any chest pain, shortness of breath, or heart palpitations. Has returned to taking his medications as  prescribed without adverse side effects.  Allergies  Allergen Reactions  . Lisinopril     cough     Current Outpatient Prescriptions on File Prior to Visit  Medication Sig Dispense Refill  . ALPRAZolam (XANAX) 1 MG tablet Take 1 tablet (1 mg total) by mouth at bedtime. 60 tablet 3  . amiodarone (PACERONE) 400 MG tablet Take 1 tablet (400 mg total) by mouth daily. 30 tablet 0  . apixaban (ELIQUIS) 2.5 MG TABS tablet Take 1 tablet (2.5 mg total) by mouth 2 (two) times daily. 60 tablet 1  . furosemide (LASIX) 20 MG tablet Take 1 tablet (20 mg total) by mouth daily. 90 tablet 3  . metoprolol succinate (TOPROL-XL) 50 MG 24 hr tablet Take 1 tablet (50 mg total) by mouth daily. 90 tablet 0  . naphazoline-pheniramine (NAPHCON-A) 0.025-0.3 % ophthalmic solution Place 1 drop into both eyes daily as needed for irritation.    . psyllium (METAMUCIL) 58.6 % packet Take 1 packet by mouth daily. Reported on 06/07/2016    . ranitidine (ZANTAC) 150 MG tablet TAKE 1 TABLET TWICE DAILY. 60 tablet 3  . sildenafil (REVATIO) 20 MG tablet Take 1-3 tablets (20-60 mg total) by mouth daily as needed. (Patient taking differently: Take 20-60 mg by mouth daily as needed. For erectile dysfunction) 60 tablet 3  . simvastatin (ZOCOR) 40 MG tablet Take 20 mg by mouth at bedtime.     No current facility-administered medications on file prior to visit.     Past Surgical History  Procedure Laterality Date  . Colonoscopy w/ biopsies and polypectomy    .  Cholecystectomy open    . Appendectomy    . Tonsillectomy and adenoidectomy    . Myringotomy with tube placement Bilateral   . Inguinal hernia repair Bilateral   . Cataract extraction Left   . Cardioversion N/A 06/04/2016    Procedure: CARDIOVERSION;  Surgeon: Pixie Casino, MD;  Location: [facility] ENDOSCOPY;  Service: Cardiovascular;  Laterality: N/A;  . Tee without cardioversion N/A 06/04/2016    Procedure: TRANSESOPHAGEAL ECHOCARDIOGRAM (TEE);  Surgeon: Pixie Casino,  MD;  Location: [facility] ENDOSCOPY;  Service: Cardiovascular;  Laterality: N/A;    Past Medical History  Diagnosis Date  . Hypertension   . Hyperlipidemia   . Coronary artery disease   . Cardiomyopathy   . Hemorrhoids   . Anxiety   . Skipped heart beats   . Arthritis     "shoulders" (05/28/2016)  . Depression     hx (05/28/2016)    Review of Systems  Constitutional: Negative for fever and chills.  Respiratory: Negative for chest tightness and shortness of breath.   Cardiovascular: Negative for chest pain, palpitations and leg swelling.  Musculoskeletal:       Positive for right arm edema.   Neurological: Negative for weakness and numbness.  Hematological: Bruises/bleeds easily.      Objective:    BP 122/72 mmHg  Pulse 53  Temp(Src) 97.8 F (36.6 C) (Oral)  Wt 192 lb 6 oz (87.261 kg)  SpO2 97% Nursing note and vital signs reviewed.  Physical Exam  Constitutional: He is oriented to person, place, and time. He appears well-developed and well-nourished. No distress.  HENT:  4 cm laceration appears healing well with no evidence of infection. There are 7 sutures in place. A scab is noted over the wound site with small areas of minor oozing present.   Cardiovascular: Normal rate, regular rhythm, normal heart sounds and intact distal pulses.   Pulmonary/Chest: Effort normal and breath sounds normal.  Musculoskeletal:  Right arm - Obvious deformity of the right upper arm which is consistent with previously noted hematoma. It is firm with distinct borders. No creptius. There is also significant discoloration with multiple colors include purple, yellow and green. ROM is within normal limits and pulses are intact and appropriate.   Neurological: He is alert and oriented to person, place, and time.  Skin: Skin is warm and dry.  Psychiatric: He has a normal mood and affect. His behavior is normal. Judgment and thought content normal.       Assessment & Plan:   Problem List Items  Addressed This Visit      Cardiovascular and Mediastinum   Syncope and collapse    No further occurrence of syncope or collapse. No heart palpitations or chest pain. Follow up with cardiology following cardioversion. Continue to monitor.       Relevant Orders   CBC   Basic Metabolic Panel (BMET)     Other   Fall   Relevant Orders   CBC   Basic Metabolic Panel (BMET)   Traumatic hematoma of right upper arm - Primary    Traumatic hematoma of the right arm appears to be healing well as discoloration is more distal consistent with being in a gravity dependent position and no significant distal findings. Recommend continued conservative treatment with elevation and hand squeezing exercises. Continue with ice for inflammation and discomfort. Tylenol as needed for pain. This will most likely heal slow secondary to use of blood thinners. Continue to monitor and follow up if symptoms do not improve.  Relevant Orders   CBC   Laceration of head    Laceration of head appears to be healing well with no evidence of infection. 7 sutures were removed with Steri-strips placed and covered with a bandage. Basic wound care instructions provided including to leave Steri-strips in place until they fall off. Return precautions provided. Follow up if symptoms do not to continue improvement.           I am having Mr. Novacek maintain his furosemide, ranitidine, sildenafil, ALPRAZolam, metoprolol succinate, psyllium, naphazoline-pheniramine, simvastatin, apixaban, and amiodarone.    Follow-up: Return if symptoms worsen or fail to improve.  Mauricio Po, FNP

## 2016-06-07 NOTE — Assessment & Plan Note (Signed)
Laceration of head appears to be healing well with no evidence of infection. 7 sutures were removed with Steri-strips placed and covered with a bandage. Basic wound care instructions provided including to leave Steri-strips in place until they fall off. Return precautions provided. Follow up if symptoms do not to continue improvement.

## 2016-06-07 NOTE — Progress Notes (Signed)
Pre visit review using our clinic review tool, if applicable. No additional management support is needed unless otherwise documented below in the visit note. 

## 2016-06-07 NOTE — Assessment & Plan Note (Signed)
No further occurrence of syncope or collapse. No heart palpitations or chest pain. Follow up with cardiology following cardioversion. Continue to monitor.

## 2016-06-07 NOTE — Patient Instructions (Signed)
Thank you for choosing [facility].  Summary/Instructions:  Please change the dressing on your head as needed. Keep it clean and dry until the steri-strips fall off. Monitor for sign of bleeding.  Elevate your right arm and hand. Work on squeezing your hand throughout the day.  Tylenol as needed for discomfort.   Keep your arm clean with soap and water. No peroxide or alcohol.   Please stop by the lab on the lower level of the building for your blood work. Your results will be released to [place] (or called to you) after review, usually within 72 hours after test completion. If any changes need to be made, you will be notified at that same time.  1. The lab is open from 7:30am to 5:30 pm Monday-Friday  2. No appointment is necessary  3. Fasting (if needed) is 6-8 hours after food and drink; black coffee and water  are okay   If your symptoms worsen or fail to improve, please contact our office for further instruction, or in case of emergency go directly to the emergency room at the closest medical facility.

## 2016-06-09 NOTE — Progress Notes (Signed)
HPI  The patient presents for follow up after a recent hospitalization for syncope with head trauma.  He was found to have a new cardiomyopathy with an EF of  20% and newly diagnosed atrial fib.  He underwent TEE/DCCV and discharged on anticoagulation and amiodarone.  Since getting home he's been okay. He was constipated and actually gave himself an enema yesterday and noticed some bleeding in the toilet after that. He is otherwise tolerating his medicines he thinks. He denies any shortness of breath. He's not had any PND although he sleeps with his head somewhat inclined. He denies any palpitations and has had no further syncope. He said no chest pressure, neck or arm discomfort. He's had some continued mild lower extremity swelling.   Allergies  Allergen Reactions  . Lisinopril Cough    Prior to Admission medications   Medication Sig Start Date End Date Taking? Authorizing Provider  ALPRAZolam Duanne Moron) 1 MG tablet Take 1 tablet (1 mg total) by mouth at bedtime. 04/12/16  Yes Evie Lacks Plotnikov, MD  amiodarone (PACERONE) 400 MG tablet Take 1 tablet (400 mg total) by mouth daily. 06/05/16  Yes Albertine Patricia, MD  apixaban (ELIQUIS) 2.5 MG TABS tablet Take 1 tablet (2.5 mg total) by mouth 2 (two) times daily. 06/05/16  Yes Silver Huguenin Elgergawy, MD  furosemide (LASIX) 20 MG tablet Take 1 tablet (20 mg total) by mouth daily. 05/23/15  Yes Evie Lacks Plotnikov, MD  metoprolol succinate (TOPROL-XL) 50 MG 24 hr tablet Take 1 tablet (50 mg total) by mouth daily. 05/15/16  Yes Minus Breeding, MD  naphazoline-pheniramine (NAPHCON-A) 0.025-0.3 % ophthalmic solution Place 1 drop into both eyes daily as needed for irritation.   Yes Historical Provider, MD  psyllium (METAMUCIL) 58.6 % packet Take 1 packet by mouth daily. Reported on 06/07/2016   Yes Historical Provider, MD  ranitidine (ZANTAC) 150 MG tablet TAKE 1 TABLET TWICE DAILY. 01/08/16  Yes Evie Lacks Plotnikov, MD  sildenafil (REVATIO) 20 MG tablet Take  1-3 tablets (20-60 mg total) by mouth daily as needed. Patient taking differently: Take 20-60 mg by mouth daily as needed. For erectile dysfunction 02/16/16  Yes Cassandria Anger, MD  simvastatin (ZOCOR) 40 MG tablet Take 20 mg by mouth at bedtime.   Yes Historical Provider, MD     Past Medical History:  Diagnosis Date  . Anxiety   . Arthritis    "shoulders" (05/28/2016)  . Cardiomyopathy   . Coronary artery disease   . Depression    hx (05/28/2016)  . Hemorrhoids   . Hyperlipidemia   . Hypertension   . Skipped heart beats     Past Surgical History:  Procedure Laterality Date  . APPENDECTOMY    . CARDIOVERSION N/A 06/04/2016   Procedure: CARDIOVERSION;  Surgeon: Pixie Casino, MD;  Location: [facility] ENDOSCOPY;  Service: Cardiovascular;  Laterality: N/A;  . CATARACT EXTRACTION Left   . CHOLECYSTECTOMY OPEN    . COLONOSCOPY W/ BIOPSIES AND POLYPECTOMY    . INGUINAL HERNIA REPAIR Bilateral   . MYRINGOTOMY WITH TUBE PLACEMENT Bilateral   . TEE WITHOUT CARDIOVERSION N/A 06/04/2016   Procedure: TRANSESOPHAGEAL ECHOCARDIOGRAM (TEE);  Surgeon: Pixie Casino, MD;  Location: [facility] ENDOSCOPY;  Service: Cardiovascular;  Laterality: N/A;  . TONSILLECTOMY AND ADENOIDECTOMY      ROS:  As stated in the HPI and negative for all other systems.  PHYSICAL EXAM BP 118/62 (BP Location: Left Arm, Patient Position: Sitting, Cuff Size: Normal)   Pulse Marland Kitchen)  53   Ht 5\' 8"  (1.727 m)   Wt 194 lb (88 kg)   BMI 29.50 kg/m  GENERAL:  Well appearing HEENT:  Right lens opacification, fundi not visualized, oral mucosa unremarkable NECK:  No jugular venous distention, waveform within normal limits, carotid upstroke brisk and symmetric, no bruits, no thyromegaly LUNGS:  Clear to auscultation bilaterally HEART:  PMI not displaced or sustained,S1 and S2 within normal limits, no S3, no S4, no clicks, no rubs, no murmurs ABD:  Flat, positive bowel sounds normal in frequency in pitch, no bruits, no rebound, no  guarding, no midline pulsatile mass, no hepatomegaly, no splenomegaly EXT:  2 plus pulses throughout, mild edema, no cyanosis no clubbing SKIN:  No rashes no nodules, multiple bruises, wound on the right upper arm.   EKG:  Sinus bradycardia, rate 53, right bundle branch block, left anterior fascicular block  06/10/2016   ASSESSMENT AND PLAN  ATRIAL FIB:   Mr. Jerion Danger has a CHA2DS2 - VASc score of 5 with a risk of stroke of 6.7%.  The patient  tolerates this rhythm and rate control and anticoagulation. We will continue with the meds as listed.  I'm going to reduce his amiodarone 200 mg daily  DILATED CARDIOMYOPATHY:    Today and actually can reduce his Toprol because of bradycardia.  In the future when I see him back in the next step will probably be to add a low-dose of an ARB keeping in mind that he does have chronic renal insufficiency.  SYNCOPE:  He has had no further episodes. He had stitches removed from the scalp.  CKD:   I will check a BMET today.   CAD:  The patient has no new sypmtoms.  No further cardiovascular testing is indicated.  We will continue with aggressive risk reduction and meds as listed.  HTN:   This is being managed in the context of treating his CHF

## 2016-06-10 ENCOUNTER — Encounter: Payer: Self-pay | Admitting: Cardiology

## 2016-06-10 ENCOUNTER — Ambulatory Visit (INDEPENDENT_AMBULATORY_CARE_PROVIDER_SITE_OTHER): Payer: Medicare Other | Admitting: Cardiology

## 2016-06-10 VITALS — BP 118/62 | HR 53 | Ht 68.0 in | Wt 194.0 lb

## 2016-06-10 DIAGNOSIS — I420 Dilated cardiomyopathy: Secondary | ICD-10-CM

## 2016-06-10 DIAGNOSIS — R001 Bradycardia, unspecified: Secondary | ICD-10-CM | POA: Diagnosis not present

## 2016-06-10 DIAGNOSIS — Z79899 Other long term (current) drug therapy: Secondary | ICD-10-CM | POA: Diagnosis not present

## 2016-06-10 DIAGNOSIS — I481 Persistent atrial fibrillation: Secondary | ICD-10-CM | POA: Diagnosis not present

## 2016-06-10 DIAGNOSIS — I4819 Other persistent atrial fibrillation: Secondary | ICD-10-CM

## 2016-06-10 LAB — CBC
HCT: 39.5 % (ref 38.5–50.0)
Hemoglobin: 13 g/dL — ABNORMAL LOW (ref 13.2–17.1)
MCH: 31.5 pg (ref 27.0–33.0)
MCHC: 32.9 g/dL (ref 32.0–36.0)
MCV: 95.6 fL (ref 80.0–100.0)
MPV: 10.7 fL (ref 7.5–12.5)
PLATELETS: 170 10*3/uL (ref 140–400)
RBC: 4.13 MIL/uL — ABNORMAL LOW (ref 4.20–5.80)
RDW: 14.1 % (ref 11.0–15.0)
WBC: 6.3 10*3/uL (ref 3.8–10.8)

## 2016-06-10 LAB — BASIC METABOLIC PANEL
BUN: 50 mg/dL — ABNORMAL HIGH (ref 7–25)
CALCIUM: 8.3 mg/dL — AB (ref 8.6–10.3)
CO2: 25 mmol/L (ref 20–31)
CREATININE: 2.31 mg/dL — AB (ref 0.70–1.11)
Chloride: 103 mmol/L (ref 98–110)
Glucose, Bld: 90 mg/dL (ref 65–99)
POTASSIUM: 5 mmol/L (ref 3.5–5.3)
SODIUM: 138 mmol/L (ref 135–146)

## 2016-06-10 MED ORDER — ALPRAZOLAM 1 MG PO TABS: 1.0000 mg | ORAL_TABLET | Freq: Every day | ORAL | 0 refills | Status: DC

## 2016-06-10 MED ORDER — AMIODARONE HCL 200 MG PO TABS: 200.0000 mg | ORAL_TABLET | Freq: Every day | ORAL | 11 refills | Status: DC

## 2016-06-10 MED ORDER — METOPROLOL SUCCINATE ER 25 MG PO TB24: 25.0000 mg | ORAL_TABLET | Freq: Every day | ORAL | 11 refills | Status: DC

## 2016-06-10 NOTE — Patient Instructions (Signed)
Medication Instructions:  DECREASE Toprol XL 25 mg daily and Amiodarone 200 mg daily  Labwork: BMP and CBC  Testing/Procedures: NONE  Follow-Up: Your physician recommends that you schedule a follow-up appointment in: 2 Weeks with Kerin Ransom or Suanne Marker Barrett   Any Other Special Instructions Will Be Listed Below (If Applicable).   If you need a refill on your cardiac medications before your next appointment, please call your pharmacy.

## 2016-06-11 ENCOUNTER — Ambulatory Visit: Payer: Medicare Other | Admitting: Family

## 2016-06-11 ENCOUNTER — Telehealth: Payer: Self-pay | Admitting: *Deleted

## 2016-06-11 ENCOUNTER — Telehealth: Payer: Self-pay | Admitting: Internal Medicine

## 2016-06-11 DIAGNOSIS — N289 Disorder of kidney and ureter, unspecified: Secondary | ICD-10-CM

## 2016-06-11 NOTE — Telephone Encounter (Signed)
Spoke with pt, he voiced understanding to stop furosemide. He reports he does not drink enough fluids. He will come to the lab Thursday for repeat.

## 2016-06-11 NOTE — Telephone Encounter (Signed)
-----   Message from Minus Breeding, MD sent at 06/11/2016  2:18 PM EDT ----- Hold Lasix.  Repeat BMET on Thursday.  Call Mr. Mckendry with the results and send results to Walker Kehr, MD

## 2016-06-11 NOTE — Telephone Encounter (Signed)
I have scheduled [name] [name] to come in for a follow up on 8/1 with Dr. Camila Li.  He seen [name] [name] last week to have stiches taken out.  He also had a hematoma on his right arm and swelling.  He is still having swelling.  [name] [name] told him to put ice on the arm.  This has helped to relieve some of the swelling.  Is there anything he can do for the rest of the swelling he has in his arm?  Patent also states that he realized he had not been taking simvastatin but he has started back up taking this medication. Please follow back up with patient in regards.

## 2016-06-12 NOTE — Telephone Encounter (Signed)
Use gentle heat over hematoma Keep ROV Thx

## 2016-06-12 NOTE — Telephone Encounter (Signed)
Left detailed mess informing pt of below.  

## 2016-06-13 LAB — BASIC METABOLIC PANEL
BUN: 36 mg/dL — AB (ref 7–25)
CALCIUM: 8.4 mg/dL — AB (ref 8.6–10.3)
CO2: 27 mmol/L (ref 20–31)
CREATININE: 1.93 mg/dL — AB (ref 0.70–1.11)
Chloride: 108 mmol/L (ref 98–110)
GLUCOSE: 105 mg/dL — AB (ref 65–99)
Potassium: 4.7 mmol/L (ref 3.5–5.3)
Sodium: 143 mmol/L (ref 135–146)

## 2016-06-17 ENCOUNTER — Telehealth: Payer: Self-pay | Admitting: *Deleted

## 2016-06-17 DIAGNOSIS — Z79899 Other long term (current) drug therapy: Secondary | ICD-10-CM

## 2016-06-17 NOTE — Telephone Encounter (Signed)
Spoke with pt about his blood work BMP was ordered, and pt is aware that he need to get it re-drawn in 1 week.

## 2016-06-17 NOTE — Telephone Encounter (Signed)
-----   Message from Minus Breeding, MD sent at 06/13/2016  9:34 AM EDT ----- Labs improved.  Repeat in one week.  Call Mr. Rencher with the results and send results to Walker Kehr, MD

## 2016-06-18 ENCOUNTER — Ambulatory Visit (INDEPENDENT_AMBULATORY_CARE_PROVIDER_SITE_OTHER): Payer: Medicare Other | Admitting: Internal Medicine

## 2016-06-18 ENCOUNTER — Encounter: Payer: Self-pay | Admitting: Internal Medicine

## 2016-06-18 DIAGNOSIS — I10 Essential (primary) hypertension: Secondary | ICD-10-CM | POA: Diagnosis not present

## 2016-06-18 DIAGNOSIS — F341 Dysthymic disorder: Secondary | ICD-10-CM

## 2016-06-18 DIAGNOSIS — I484 Atypical atrial flutter: Secondary | ICD-10-CM | POA: Diagnosis not present

## 2016-06-18 DIAGNOSIS — I420 Dilated cardiomyopathy: Secondary | ICD-10-CM | POA: Diagnosis not present

## 2016-06-18 DIAGNOSIS — K5901 Slow transit constipation: Secondary | ICD-10-CM

## 2016-06-18 DIAGNOSIS — N183 Chronic kidney disease, stage 3 unspecified: Secondary | ICD-10-CM

## 2016-06-18 DIAGNOSIS — E785 Hyperlipidemia, unspecified: Secondary | ICD-10-CM

## 2016-06-18 DIAGNOSIS — I2510 Atherosclerotic heart disease of native coronary artery without angina pectoris: Secondary | ICD-10-CM | POA: Diagnosis not present

## 2016-06-18 MED ORDER — LINACLOTIDE 145 MCG PO CAPS: 145.0000 ug | ORAL_CAPSULE | Freq: Every day | ORAL | 11 refills | Status: DC | PRN

## 2016-06-18 NOTE — Assessment & Plan Note (Signed)
Xanax prn  Potential benefits of a long term benzodiazepines  use as well as potential risks  and complications were explained to the patient and were aknowledged. 

## 2016-06-18 NOTE — Progress Notes (Signed)
Subjective:  Patient ID: [name] [name], male    DOB: Nov 29, 1919  Age: 82 y.o. MRN: [id number]  CC: No chief complaint on file.   HPI [name] [name] presents for post-hosp f/u. F/u CAD, HTN. He was found to have a new cardiomyopathy with an EF of  20% and newly diagnosed atrial fib.  He underwent TEE/DCCV and discharged on anticoagulation and amiodarone.     Outpatient Medications Prior to Visit  Medication Sig Dispense Refill  . ALPRAZolam (XANAX) 1 MG tablet Take 1 tablet (1 mg total) by mouth at bedtime. 30 tablet 0  . amiodarone (PACERONE) 200 MG tablet Take 1 tablet (200 mg total) by mouth daily. 30 tablet 11  . apixaban (ELIQUIS) 2.5 MG TABS tablet Take 1 tablet (2.5 mg total) by mouth 2 (two) times daily. 60 tablet 1  . furosemide (LASIX) 20 MG tablet Take 1 tablet (20 mg total) by mouth daily. 90 tablet 3  . metoprolol succinate (TOPROL-XL) 25 MG 24 hr tablet Take 1 tablet (25 mg total) by mouth daily. 30 tablet 11  . naphazoline-pheniramine (NAPHCON-A) 0.025-0.3 % ophthalmic solution Place 1 drop into both eyes daily as needed for irritation.    . psyllium (METAMUCIL) 58.6 % packet Take 1 packet by mouth daily. Reported on 06/07/2016    . ranitidine (ZANTAC) 150 MG tablet TAKE 1 TABLET TWICE DAILY. 60 tablet 3  . sildenafil (REVATIO) 20 MG tablet Take 1-3 tablets (20-60 mg total) by mouth daily as needed. (Patient taking differently: Take 20-60 mg by mouth daily as needed. For erectile dysfunction) 60 tablet 3  . simvastatin (ZOCOR) 40 MG tablet Take 20 mg by mouth at bedtime.     No facility-administered medications prior to visit.     ROS Review of Systems  Constitutional: Positive for fatigue. Negative for appetite change and unexpected weight change.  HENT: Negative for congestion, nosebleeds, sneezing, sore throat and trouble swallowing.   Eyes: Negative for itching and visual disturbance.  Respiratory: Positive for shortness of breath. Negative for cough.   Cardiovascular:  Negative for chest pain, palpitations and leg swelling.  Gastrointestinal: Negative for abdominal distention, blood in stool, diarrhea and nausea.  Genitourinary: Negative for frequency and hematuria.  Musculoskeletal: Positive for arthralgias, back pain and gait problem. Negative for joint swelling and neck pain.  Skin: Negative for rash.  Neurological: Positive for weakness. Negative for dizziness, tremors and speech difficulty.  Psychiatric/Behavioral: Negative for agitation, dysphoric mood and sleep disturbance. The patient is not nervous/anxious.     Objective:  BP 130/62   Pulse (!) 53   Wt 190 lb (86.2 kg)   SpO2 97%   BMI 28.89 kg/m   BP Readings from Last 3 Encounters:  06/18/16 130/62  06/10/16 118/62  06/07/16 122/72    Wt Readings from Last 3 Encounters:  06/18/16 190 lb (86.2 kg)  06/10/16 194 lb (88 kg)  06/07/16 192 lb 6 oz (87.3 kg)    Physical Exam  Constitutional: He is oriented to person, place, and time. He appears well-developed. No distress.  NAD  HENT:  Mouth/Throat: Oropharynx is clear and moist.  Eyes: Conjunctivae are normal. Pupils are equal, round, and reactive to light.  Neck: Normal range of motion. No JVD present. No thyromegaly present.  Cardiovascular: Normal rate and intact distal pulses.  Exam reveals no gallop and no friction rub.   No murmur heard. Pulmonary/Chest: Effort normal and breath sounds normal. No respiratory distress. He has no wheezes. He has no rales.  He exhibits no tenderness.  Abdominal: Soft. Bowel sounds are normal. He exhibits no distension and no mass. There is no tenderness. There is no rebound and no guarding.  Musculoskeletal: Normal range of motion. He exhibits tenderness. He exhibits no edema.  Lymphadenopathy:    He has no cervical adenopathy.  Neurological: He is alert and oriented to person, place, and time. He has normal reflexes. No cranial nerve deficit. He exhibits normal muscle tone. He displays a negative  Romberg sign. Coordination abnormal. Gait normal.  Skin: Skin is warm and dry. No rash noted.  Psychiatric: He has a normal mood and affect. His behavior is normal. Judgment and thought content normal.  a couple wounds were dressed irreg beats  Lab Results  Component Value Date   WBC 6.3 06/10/2016   HGB 13.0 (L) 06/10/2016   HCT 39.5 06/10/2016   PLT 170 06/10/2016   GLUCOSE 105 (H) 06/11/2016   CHOL 110 05/25/2013   TRIG 83.0 05/25/2013   HDL 39.00 (L) 05/25/2013   LDLCALC 54 05/25/2013   ALT 10 12/14/2015   AST 17 12/14/2015   NA 143 06/11/2016   K 4.7 06/11/2016   CL 108 06/11/2016   CREATININE 1.93 (H) 06/11/2016   BUN 36 (H) 06/11/2016   CO2 27 06/11/2016   TSH 2.42 05/23/2015   PSA 0.24 03/08/2014    Ct Head Wo Contrast  Result Date: 05/28/2016 CLINICAL DATA:  82 year old male with a history of fall EXAM: CT HEAD WITHOUT CONTRAST TECHNIQUE: Contiguous axial images were obtained from the base of the skull through the vertex without intravenous contrast. COMPARISON:  None. FINDINGS: Unremarkable appearance of the calvarium without acute fracture or aggressive lesion. Soft tissue swelling with disruption of the soft tissues at the frontal vertex, new from the comparison CT. In the interval, the prior soft tissue swelling in the left supraorbital scalp has resolved. No radiopaque foreign body. Unremarkable appearance of the bilateral orbits. Mastoid air cells are clear. No significant paranasal sinus disease No acute hemorrhage. No midline shift or mass effect. Gray-white differentiation maintained. Configuration the ventricles unchanged. Senescent calcifications of the basal ganglia. Intracranial atherosclerosis. IMPRESSION: No CT evidence of acute intracranial abnormality. Soft tissue swelling in the vertex of the scalp, frontal region, without associated fracture. Signed, Dulcy Fanny. Earleen Newport, DO [facility] Electronically  Signed   By: Corrie Mckusick D.O.   On: 05/28/2016 11:56    Assessment & Plan:   There are no diagnoses linked to this encounter. I am having Mr. Furno maintain his furosemide, ranitidine, sildenafil, psyllium, naphazoline-pheniramine, simvastatin, apixaban, ALPRAZolam, metoprolol succinate, and amiodarone.  No orders of the defined types were placed in this encounter.    Follow-up: No Follow-up on file.  Walker Kehr, MD

## 2016-06-18 NOTE — Assessment & Plan Note (Signed)
On Toprol, Furosemide 

## 2016-06-18 NOTE — Progress Notes (Signed)
Pre visit review using our clinic review tool, if applicable. No additional management support is needed unless otherwise documented below in the visit note. 

## 2016-06-18 NOTE — Assessment & Plan Note (Signed)
Linzess prn 

## 2016-06-18 NOTE — Assessment & Plan Note (Signed)
On Toprol, Simvastatin

## 2016-06-18 NOTE — Assessment & Plan Note (Signed)
new cardiomyopathy with an EF of  20% and newly diagnosed atrial fib.  S/p TEE/DCCV  on anticoagulation and amiodarone

## 2016-06-18 NOTE — Assessment & Plan Note (Signed)
Labs in 4 wks Off ARB

## 2016-06-18 NOTE — Assessment & Plan Note (Signed)
On Simvastatin 

## 2016-06-25 DIAGNOSIS — Z79899 Other long term (current) drug therapy: Secondary | ICD-10-CM | POA: Diagnosis not present

## 2016-06-26 LAB — BASIC METABOLIC PANEL
BUN: 30 mg/dL — AB (ref 7–25)
CALCIUM: 8.7 mg/dL (ref 8.6–10.3)
CO2: 24 mmol/L (ref 20–31)
Chloride: 106 mmol/L (ref 98–110)
Creat: 1.56 mg/dL — ABNORMAL HIGH (ref 0.70–1.11)
GLUCOSE: 91 mg/dL (ref 65–99)
POTASSIUM: 5.1 mmol/L (ref 3.5–5.3)
SODIUM: 141 mmol/L (ref 135–146)

## 2016-06-28 ENCOUNTER — Other Ambulatory Visit: Payer: Self-pay | Admitting: *Deleted

## 2016-06-28 ENCOUNTER — Telehealth: Payer: Self-pay | Admitting: Cardiology

## 2016-06-28 NOTE — Telephone Encounter (Signed)
Error

## 2016-07-08 ENCOUNTER — Other Ambulatory Visit: Payer: Self-pay | Admitting: Internal Medicine

## 2016-07-15 ENCOUNTER — Encounter (HOSPITAL_COMMUNITY): Payer: Self-pay

## 2016-07-17 ENCOUNTER — Ambulatory Visit (INDEPENDENT_AMBULATORY_CARE_PROVIDER_SITE_OTHER): Payer: Medicare Other | Admitting: Internal Medicine

## 2016-07-17 ENCOUNTER — Encounter: Payer: Self-pay | Admitting: Internal Medicine

## 2016-07-17 VITALS — BP 120/76 | HR 49 | Temp 97.5°F | Wt 185.0 lb

## 2016-07-17 DIAGNOSIS — Z23 Encounter for immunization: Secondary | ICD-10-CM | POA: Diagnosis not present

## 2016-07-17 DIAGNOSIS — F341 Dysthymic disorder: Secondary | ICD-10-CM

## 2016-07-17 DIAGNOSIS — I5022 Chronic systolic (congestive) heart failure: Secondary | ICD-10-CM

## 2016-07-17 DIAGNOSIS — I10 Essential (primary) hypertension: Secondary | ICD-10-CM

## 2016-07-17 DIAGNOSIS — I480 Paroxysmal atrial fibrillation: Secondary | ICD-10-CM | POA: Diagnosis not present

## 2016-07-17 DIAGNOSIS — M25569 Pain in unspecified knee: Secondary | ICD-10-CM | POA: Insufficient documentation

## 2016-07-17 DIAGNOSIS — I420 Dilated cardiomyopathy: Secondary | ICD-10-CM

## 2016-07-17 DIAGNOSIS — N183 Chronic kidney disease, stage 3 unspecified: Secondary | ICD-10-CM

## 2016-07-17 DIAGNOSIS — M25562 Pain in left knee: Secondary | ICD-10-CM

## 2016-07-17 DIAGNOSIS — M25561 Pain in right knee: Secondary | ICD-10-CM

## 2016-07-17 NOTE — Assessment & Plan Note (Signed)
on anticoagulation and amiodarone

## 2016-07-17 NOTE — Assessment & Plan Note (Signed)
L knee is tender over B anserina Ice  Declined injection

## 2016-07-17 NOTE — Progress Notes (Signed)
Subjective:  Patient ID: [name] [name], male    DOB: 1920-08-13  Age: 82 y.o. MRN: [id number]  CC: No chief complaint on file.   HPI [name] [name] presents for CAD, cardiomyopathy, A fib f/u C/o L knee pain - worse  Outpatient Medications Prior to Visit  Medication Sig Dispense Refill  . ALPRAZolam (XANAX) 1 MG tablet Take 1 tablet (1 mg total) by mouth at bedtime. 30 tablet 0  . amiodarone (PACERONE) 200 MG tablet Take 1 tablet (200 mg total) by mouth daily. 30 tablet 11  . apixaban (ELIQUIS) 2.5 MG TABS tablet Take 1 tablet (2.5 mg total) by mouth 2 (two) times daily. 60 tablet 1  . furosemide (LASIX) 20 MG tablet Take 1 tablet (20 mg total) by mouth daily. 90 tablet 3  . linaclotide (LINZESS) 145 MCG CAPS capsule Take 1 capsule (145 mcg total) by mouth daily as needed. 30 capsule 11  . naphazoline-pheniramine (NAPHCON-A) 0.025-0.3 % ophthalmic solution Place 1 drop into both eyes daily as needed for irritation.    . psyllium (METAMUCIL) 58.6 % packet Take 1 packet by mouth daily. Reported on 06/07/2016    . ranitidine (ZANTAC) 150 MG tablet TAKE 1 TABLET TWICE DAILY. 60 tablet 2  . sildenafil (REVATIO) 20 MG tablet Take 1-3 tablets (20-60 mg total) by mouth daily as needed. (Patient taking differently: Take 20-60 mg by mouth daily as needed. For erectile dysfunction) 60 tablet 3  . simvastatin (ZOCOR) 40 MG tablet Take 20 mg by mouth at bedtime.    . metoprolol succinate (TOPROL-XL) 25 MG 24 hr tablet Take 1 tablet (25 mg total) by mouth daily. 30 tablet 11   No facility-administered medications prior to visit.     ROS Review of Systems  Constitutional: Positive for fatigue. Negative for appetite change and unexpected weight change.  HENT: Negative for congestion, nosebleeds, sneezing, sore throat and trouble swallowing.   Eyes: Negative for itching and visual disturbance.  Respiratory: Negative for cough.   Cardiovascular: Negative for chest pain, palpitations and leg swelling.    Gastrointestinal: Negative for abdominal distention, blood in stool, diarrhea and nausea.  Genitourinary: Negative for frequency and hematuria.  Musculoskeletal: Positive for gait problem. Negative for back pain, joint swelling and neck pain.  Skin: Negative for rash.  Neurological: Negative for dizziness, tremors, speech difficulty and weakness.  Psychiatric/Behavioral: Positive for sleep disturbance. Negative for agitation and dysphoric mood. The patient is nervous/anxious.     Objective:  BP 120/76   Pulse (!) 49   Temp 97.5 F (36.4 C) (Oral)   Wt 185 lb (83.9 kg)   SpO2 97%   BMI 28.13 kg/m   BP Readings from Last 3 Encounters:  07/17/16 120/76  06/18/16 130/62  06/10/16 118/62    Wt Readings from Last 3 Encounters:  07/17/16 185 lb (83.9 kg)  06/18/16 190 lb (86.2 kg)  06/10/16 194 lb (88 kg)    Physical Exam  Constitutional: He is oriented to person, place, and time. He appears well-developed. No distress.  NAD  HENT:  Mouth/Throat: Oropharynx is clear and moist.  Eyes: Conjunctivae are normal. Pupils are equal, round, and reactive to light.  Neck: Normal range of motion. No JVD present. No thyromegaly present.  Cardiovascular: Normal rate, regular rhythm, normal heart sounds and intact distal pulses.  Exam reveals no gallop and no friction rub.   No murmur heard. Pulmonary/Chest: Effort normal and breath sounds normal. No respiratory distress. He has no wheezes. He has no rales.  He exhibits no tenderness.  Abdominal: Soft. Bowel sounds are normal. He exhibits no distension and no mass. There is no tenderness. There is no rebound and no guarding.  Musculoskeletal: Normal range of motion. He exhibits no edema or tenderness.  Lymphadenopathy:    He has no cervical adenopathy.  Neurological: He is alert and oriented to person, place, and time. He has normal reflexes. No cranial nerve deficit. He exhibits normal muscle tone. He displays a negative Romberg sign.  Coordination abnormal. Gait normal.  Skin: Skin is warm and dry. No rash noted.  Psychiatric: He has a normal mood and affect. His behavior is normal. Judgment and thought content normal.  Limp L knee is tender over B anserina  Lab Results  Component Value Date   WBC 6.3 06/10/2016   HGB 13.0 (L) 06/10/2016   HCT 39.5 06/10/2016   PLT 170 06/10/2016   GLUCOSE 91 06/25/2016   CHOL 110 05/25/2013   TRIG 83.0 05/25/2013   HDL 39.00 (L) 05/25/2013   LDLCALC 54 05/25/2013   ALT 10 12/14/2015   AST 17 12/14/2015   NA 141 06/25/2016   K 5.1 06/25/2016   CL 106 06/25/2016   CREATININE 1.56 (H) 06/25/2016   BUN 30 (H) 06/25/2016   CO2 24 06/25/2016   TSH 2.42 05/23/2015   PSA 0.24 03/08/2014    Ct Head Wo Contrast  Result Date: 05/28/2016 CLINICAL DATA:  82 year old male with a history of fall EXAM: CT HEAD WITHOUT CONTRAST TECHNIQUE: Contiguous axial images were obtained from the base of the skull through the vertex without intravenous contrast. COMPARISON:  None. FINDINGS: Unremarkable appearance of the calvarium without acute fracture or aggressive lesion. Soft tissue swelling with disruption of the soft tissues at the frontal vertex, new from the comparison CT. In the interval, the prior soft tissue swelling in the left supraorbital scalp has resolved. No radiopaque foreign body. Unremarkable appearance of the bilateral orbits. Mastoid air cells are clear. No significant paranasal sinus disease No acute hemorrhage. No midline shift or mass effect. Gray-white differentiation maintained. Configuration the ventricles unchanged. Senescent calcifications of the basal ganglia. Intracranial atherosclerosis. IMPRESSION: No CT evidence of acute intracranial abnormality. Soft tissue swelling in the vertex of the scalp, frontal region, without associated fracture. Signed, Dulcy Fanny. Earleen Newport, DO [facility] Electronically Signed   By: Corrie Mckusick D.O.   On: 05/28/2016 11:56    Assessment & Plan:   There are no diagnoses linked to this encounter. I am having [name] [name] maintain his furosemide, sildenafil, psyllium, naphazoline-pheniramine, simvastatin, apixaban, ALPRAZolam, amiodarone, linaclotide, ranitidine, and metoprolol succinate.  Meds ordered this encounter  Medications  . metoprolol succinate (TOPROL-XL) 50 MG 24 hr tablet    Sig: Take 0.5 tablets by mouth daily.    Refill:  0     Follow-up: No Follow-up on file.  Walker Kehr, MD

## 2016-07-17 NOTE — Addendum Note (Signed)
Addended by: Cresenciano Lick on: 07/17/2016 09:52 AM   Modules accepted: Orders

## 2016-07-17 NOTE — Progress Notes (Signed)
Pre visit review using our clinic review tool, if applicable. No additional management support is needed unless otherwise documented below in the visit note. 

## 2016-07-17 NOTE — Assessment & Plan Note (Signed)
Monitoring labs 

## 2016-07-17 NOTE — Assessment & Plan Note (Signed)
Toprol, Furosemide 

## 2016-07-17 NOTE — Assessment & Plan Note (Signed)
Xanax prn  Potential benefits of a long term benzodiazepines  use as well as potential risks  and complications were explained to the patient and were aknowledged. 

## 2016-07-18 ENCOUNTER — Telehealth: Payer: Self-pay | Admitting: Emergency Medicine

## 2016-07-18 NOTE — Telephone Encounter (Signed)
Pt called and wanted to know if you have any samples of linaclotide (LINZESS) 145 MCG CAPS capsule. If so can he come by and pick some up. Please advise thanks.

## 2016-07-19 MED ORDER — LINACLOTIDE 145 MCG PO CAPS: 145.0000 ug | ORAL_CAPSULE | Freq: Every day | ORAL | 0 refills | Status: DC | PRN

## 2016-07-19 NOTE — Telephone Encounter (Signed)
Linzess samples are upfront for p/u. Left detailed mess informing pt.

## 2016-07-23 ENCOUNTER — Telehealth: Payer: Self-pay

## 2016-07-23 NOTE — Telephone Encounter (Signed)
PA initiated via CoverMyMeds key [id number]

## 2016-07-23 NOTE — Telephone Encounter (Signed)
PA APPROVED through 07/23/2017

## 2016-07-24 ENCOUNTER — Other Ambulatory Visit: Payer: Self-pay | Admitting: Internal Medicine

## 2016-07-29 ENCOUNTER — Other Ambulatory Visit: Payer: Self-pay | Admitting: Internal Medicine

## 2016-07-31 DIAGNOSIS — L578 Other skin changes due to chronic exposure to nonionizing radiation: Secondary | ICD-10-CM | POA: Diagnosis not present

## 2016-07-31 DIAGNOSIS — C44311 Basal cell carcinoma of skin of nose: Secondary | ICD-10-CM | POA: Diagnosis not present

## 2016-08-08 ENCOUNTER — Other Ambulatory Visit: Payer: Self-pay | Admitting: Internal Medicine

## 2016-08-20 ENCOUNTER — Ambulatory Visit (INDEPENDENT_AMBULATORY_CARE_PROVIDER_SITE_OTHER): Payer: Medicare Other | Admitting: Internal Medicine

## 2016-08-20 ENCOUNTER — Other Ambulatory Visit (INDEPENDENT_AMBULATORY_CARE_PROVIDER_SITE_OTHER): Payer: Medicare Other

## 2016-08-20 ENCOUNTER — Encounter: Payer: Self-pay | Admitting: Internal Medicine

## 2016-08-20 VITALS — BP 140/80 | HR 45 | Wt 190.0 lb

## 2016-08-20 DIAGNOSIS — R350 Frequency of micturition: Secondary | ICD-10-CM

## 2016-08-20 DIAGNOSIS — I480 Paroxysmal atrial fibrillation: Secondary | ICD-10-CM

## 2016-08-20 DIAGNOSIS — I484 Atypical atrial flutter: Secondary | ICD-10-CM | POA: Diagnosis not present

## 2016-08-20 DIAGNOSIS — I5022 Chronic systolic (congestive) heart failure: Secondary | ICD-10-CM

## 2016-08-20 DIAGNOSIS — I420 Dilated cardiomyopathy: Secondary | ICD-10-CM

## 2016-08-20 DIAGNOSIS — N401 Enlarged prostate with lower urinary tract symptoms: Secondary | ICD-10-CM

## 2016-08-20 DIAGNOSIS — I10 Essential (primary) hypertension: Secondary | ICD-10-CM | POA: Diagnosis not present

## 2016-08-20 DIAGNOSIS — K5901 Slow transit constipation: Secondary | ICD-10-CM

## 2016-08-20 DIAGNOSIS — C44309 Unspecified malignant neoplasm of skin of other parts of face: Secondary | ICD-10-CM

## 2016-08-20 LAB — CBC WITH DIFFERENTIAL/PLATELET
BASOS ABS: 0 10*3/uL (ref 0.0–0.1)
Basophils Relative: 0.5 % (ref 0.0–3.0)
EOS ABS: 0.2 10*3/uL (ref 0.0–0.7)
Eosinophils Relative: 3.7 % (ref 0.0–5.0)
HCT: 42.9 % (ref 39.0–52.0)
HEMOGLOBIN: 14.6 g/dL (ref 13.0–17.0)
Lymphocytes Relative: 20 % (ref 12.0–46.0)
Lymphs Abs: 1.1 10*3/uL (ref 0.7–4.0)
MCHC: 33.9 g/dL (ref 30.0–36.0)
MCV: 93.9 fl (ref 78.0–100.0)
MONO ABS: 0.7 10*3/uL (ref 0.1–1.0)
Monocytes Relative: 12.8 % — ABNORMAL HIGH (ref 3.0–12.0)
Neutro Abs: 3.4 10*3/uL (ref 1.4–7.7)
Neutrophils Relative %: 63 % (ref 43.0–77.0)
Platelets: 132 10*3/uL — ABNORMAL LOW (ref 150.0–400.0)
RBC: 4.57 Mil/uL (ref 4.22–5.81)
RDW: 13.8 % (ref 11.5–15.5)
WBC: 5.3 10*3/uL (ref 4.0–10.5)

## 2016-08-20 LAB — BASIC METABOLIC PANEL
BUN: 28 mg/dL — AB (ref 6–23)
CO2: 26 mEq/L (ref 19–32)
CREATININE: 1.57 mg/dL — AB (ref 0.40–1.50)
Calcium: 8.3 mg/dL — ABNORMAL LOW (ref 8.4–10.5)
Chloride: 104 mEq/L (ref 96–112)
GFR: 43.89 mL/min — AB (ref >60.00)
Glucose, Bld: 103 mg/dL — ABNORMAL HIGH (ref 70–99)
Potassium: 4.5 mEq/L (ref 3.5–5.1)
Sodium: 138 mEq/L (ref 135–145)

## 2016-08-20 MED ORDER — FINASTERIDE 5 MG PO TABS: 5.0000 mg | ORAL_TABLET | Freq: Every day | ORAL | 3 refills | Status: DC

## 2016-08-20 NOTE — Assessment & Plan Note (Signed)
on anticoagulation and amiodarone, Furosemide NAS diet

## 2016-08-20 NOTE — Progress Notes (Signed)
Pre visit review using our clinic review tool, if applicable. No additional management support is needed unless otherwise documented below in the visit note. 

## 2016-08-20 NOTE — Assessment & Plan Note (Signed)
on anticoagulation and amiodarone, furosemide

## 2016-08-20 NOTE — Assessment & Plan Note (Signed)
on anticoagulation and amiodarone

## 2016-08-20 NOTE — Assessment & Plan Note (Signed)
Linzess 

## 2016-08-20 NOTE — Assessment & Plan Note (Signed)
on anticoagulation and amiodarone, Toprol

## 2016-08-20 NOTE — Assessment & Plan Note (Signed)
On Toprol, Furosemide 

## 2016-08-20 NOTE — Assessment & Plan Note (Signed)
Derm appt in 3 wks

## 2016-08-20 NOTE — Progress Notes (Signed)
Subjective:  Patient ID: [name] [name], male    DOB: Nov 01, 1920  Age: 82 y.o. MRN: [id number]  CC: No chief complaint on file.   HPI Bexley Mosey presents for weakness, A fib, CHF f/u. C/o unsteady gait. C/o urinary frequency at night -- 5/night  Outpatient Medications Prior to Visit  Medication Sig Dispense Refill  . ALPRAZolam (XANAX) 1 MG tablet Take 1 tablet (1 mg total) by mouth at bedtime. 30 tablet 0  . amiodarone (PACERONE) 200 MG tablet TAKE (2) TABLETS DAILY. 60 tablet 5  . ELIQUIS 2.5 MG TABS tablet TAKE 1 TABLET TWICE DAILY. 60 tablet 11  . furosemide (LASIX) 20 MG tablet Take 1 tablet (20 mg total) by mouth daily. 90 tablet 3  . linaclotide (LINZESS) 145 MCG CAPS capsule Take 1 capsule (145 mcg total) by mouth daily as needed. 16 capsule 0  . metoprolol succinate (TOPROL-XL) 50 MG 24 hr tablet Take 0.5 tablets by mouth daily.  0  . naphazoline-pheniramine (NAPHCON-A) 0.025-0.3 % ophthalmic solution Place 1 drop into both eyes daily as needed for irritation.    . psyllium (METAMUCIL) 58.6 % packet Take 1 packet by mouth daily. Reported on 06/07/2016    . ranitidine (ZANTAC) 150 MG tablet TAKE 1 TABLET TWICE DAILY. 60 tablet 2  . sildenafil (REVATIO) 20 MG tablet Take 1-3 tablets (20-60 mg total) by mouth daily as needed. (Patient taking differently: Take 20-60 mg by mouth daily as needed. For erectile dysfunction) 60 tablet 3  . simvastatin (ZOCOR) 40 MG tablet Take 20 mg by mouth at bedtime.     No facility-administered medications prior to visit.     ROS Review of Systems  Constitutional: Positive for fatigue. Negative for appetite change and unexpected weight change.  HENT: Negative for congestion, nosebleeds, sneezing, sore throat and trouble swallowing.   Eyes: Negative for itching and visual disturbance.  Respiratory: Negative for cough.   Cardiovascular: Negative for chest pain, palpitations and leg swelling.  Gastrointestinal: Negative for abdominal distention,  blood in stool, diarrhea and nausea.  Genitourinary: Positive for frequency. Negative for hematuria.  Musculoskeletal: Positive for arthralgias and gait problem. Negative for back pain, joint swelling and neck pain.  Skin: Negative for rash.  Neurological: Positive for weakness. Negative for dizziness, tremors and speech difficulty.  Psychiatric/Behavioral: Positive for sleep disturbance. Negative for agitation and dysphoric mood. The patient is not nervous/anxious.     Objective:  BP 140/80   Pulse (!) 45   Wt 190 lb (86.2 kg)   SpO2 94%   BMI 28.89 kg/m   BP Readings from Last 3 Encounters:  08/20/16 140/80  07/17/16 120/76  06/18/16 130/62    Wt Readings from Last 3 Encounters:  08/20/16 190 lb (86.2 kg)  07/17/16 185 lb (83.9 kg)  06/18/16 190 lb (86.2 kg)    Physical Exam  Constitutional: He is oriented to person, place, and time. He appears well-developed. No distress.  NAD  HENT:  Mouth/Throat: Oropharynx is clear and moist.  Eyes: Conjunctivae are normal. Left eye exhibits no discharge.  Neck: Normal range of motion. No JVD present. No thyromegaly present.  Cardiovascular: Normal heart sounds and intact distal pulses.  Exam reveals no gallop and no friction rub.   No murmur heard. Pulmonary/Chest: Effort normal and breath sounds normal. No respiratory distress. He has no wheezes. He has no rales. He exhibits no tenderness.  Abdominal: Soft. Bowel sounds are normal. He exhibits no distension and no mass. There is no tenderness. There  is no rebound and no guarding.  Musculoskeletal: Normal range of motion. He exhibits no edema or tenderness.  Lymphadenopathy:    He has no cervical adenopathy.  Neurological: He is alert and oriented to person, place, and time. He has normal reflexes. No cranial nerve deficit. He exhibits normal muscle tone. He displays a negative Romberg sign. Coordination and gait normal.  Skin: Skin is warm and dry. No rash noted.  Psychiatric: He  has a normal mood and affect. His behavior is normal. Judgment and thought content normal.  R eye - blind Ataxic Bradycardic Hard hearing  Lab Results  Component Value Date   WBC 6.3 06/10/2016   HGB 13.0 (L) 06/10/2016   HCT 39.5 06/10/2016   PLT 170 06/10/2016   GLUCOSE 91 06/25/2016   CHOL 110 05/25/2013   TRIG 83.0 05/25/2013   HDL 39.00 (L) 05/25/2013   LDLCALC 54 05/25/2013   ALT 10 12/14/2015   AST 17 12/14/2015   NA 141 06/25/2016   K 5.1 06/25/2016   CL 106 06/25/2016   CREATININE 1.56 (H) 06/25/2016   BUN 30 (H) 06/25/2016   CO2 24 06/25/2016   TSH 2.42 05/23/2015   PSA 0.24 03/08/2014    Ct Head Wo Contrast  Result Date: 05/28/2016 CLINICAL DATA:  82 year old male with a history of fall EXAM: CT HEAD WITHOUT CONTRAST TECHNIQUE: Contiguous axial images were obtained from the base of the skull through the vertex without intravenous contrast. COMPARISON:  None. FINDINGS: Unremarkable appearance of the calvarium without acute fracture or aggressive lesion. Soft tissue swelling with disruption of the soft tissues at the frontal vertex, new from the comparison CT. In the interval, the prior soft tissue swelling in the left supraorbital scalp has resolved. No radiopaque foreign body. Unremarkable appearance of the bilateral orbits. Mastoid air cells are clear. No significant paranasal sinus disease No acute hemorrhage. No midline shift or mass effect. Gray-white differentiation maintained. Configuration the ventricles unchanged. Senescent calcifications of the basal ganglia. Intracranial atherosclerosis. IMPRESSION: No CT evidence of acute intracranial abnormality. Soft tissue swelling in the vertex of the scalp, frontal region, without associated fracture. Signed, Dulcy Fanny. Earleen Newport, DO [facility] Electronically Signed   By: Corrie Mckusick D.O.   On: 05/28/2016 11:56    Assessment & Plan:   There are no diagnoses linked  to this encounter. 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  No orders of the defined types were placed in this encounter.    Follow-up: No Follow-up on file.  Walker Kehr, MD

## 2016-08-20 NOTE — Assessment & Plan Note (Signed)
Start Proscar. 

## 2016-08-22 ENCOUNTER — Emergency Department (HOSPITAL_COMMUNITY): Payer: Medicare Other

## 2016-08-22 ENCOUNTER — Encounter (HOSPITAL_COMMUNITY): Payer: Self-pay

## 2016-08-22 ENCOUNTER — Observation Stay (HOSPITAL_COMMUNITY)
Admission: EM | Admit: 2016-08-22 | Discharge: 2016-08-23 | Disposition: A | Discharge status: Home / Self Care | Payer: Medicare Other | Attending: Internal Medicine | Admitting: Internal Medicine

## 2016-08-22 DIAGNOSIS — F418 Other specified anxiety disorders: Secondary | ICD-10-CM | POA: Insufficient documentation

## 2016-08-22 DIAGNOSIS — Z9842 Cataract extraction status, left eye: Secondary | ICD-10-CM | POA: Insufficient documentation

## 2016-08-22 DIAGNOSIS — I5022 Chronic systolic (congestive) heart failure: Secondary | ICD-10-CM | POA: Diagnosis not present

## 2016-08-22 DIAGNOSIS — N183 Chronic kidney disease, stage 3 unspecified: Secondary | ICD-10-CM | POA: Diagnosis present

## 2016-08-22 DIAGNOSIS — Z9049 Acquired absence of other specified parts of digestive tract: Secondary | ICD-10-CM | POA: Insufficient documentation

## 2016-08-22 DIAGNOSIS — M19011 Primary osteoarthritis, right shoulder: Secondary | ICD-10-CM | POA: Diagnosis not present

## 2016-08-22 DIAGNOSIS — W19XXXA Unspecified fall, initial encounter: Secondary | ICD-10-CM

## 2016-08-22 DIAGNOSIS — Z7901 Long term (current) use of anticoagulants: Secondary | ICD-10-CM | POA: Insufficient documentation

## 2016-08-22 DIAGNOSIS — I480 Paroxysmal atrial fibrillation: Secondary | ICD-10-CM | POA: Insufficient documentation

## 2016-08-22 DIAGNOSIS — I440 Atrioventricular block, first degree: Secondary | ICD-10-CM | POA: Diagnosis not present

## 2016-08-22 DIAGNOSIS — I498 Other specified cardiac arrhythmias: Secondary | ICD-10-CM | POA: Diagnosis not present

## 2016-08-22 DIAGNOSIS — D696 Thrombocytopenia, unspecified: Secondary | ICD-10-CM | POA: Diagnosis not present

## 2016-08-22 DIAGNOSIS — I4510 Unspecified right bundle-branch block: Secondary | ICD-10-CM | POA: Insufficient documentation

## 2016-08-22 DIAGNOSIS — W010XXA Fall on same level from slipping, tripping and stumbling without subsequent striking against object, initial encounter: Secondary | ICD-10-CM | POA: Diagnosis not present

## 2016-08-22 DIAGNOSIS — R262 Difficulty in walking, not elsewhere classified: Secondary | ICD-10-CM

## 2016-08-22 DIAGNOSIS — Z79899 Other long term (current) drug therapy: Secondary | ICD-10-CM | POA: Insufficient documentation

## 2016-08-22 DIAGNOSIS — I130 Hypertensive heart and chronic kidney disease with heart failure and stage 1 through stage 4 chronic kidney disease, or unspecified chronic kidney disease: Secondary | ICD-10-CM | POA: Diagnosis not present

## 2016-08-22 DIAGNOSIS — S199XXA Unspecified injury of neck, initial encounter: Secondary | ICD-10-CM | POA: Diagnosis not present

## 2016-08-22 DIAGNOSIS — N400 Enlarged prostate without lower urinary tract symptoms: Secondary | ICD-10-CM | POA: Insufficient documentation

## 2016-08-22 DIAGNOSIS — I4819 Other persistent atrial fibrillation: Secondary | ICD-10-CM | POA: Diagnosis present

## 2016-08-22 DIAGNOSIS — Z888 Allergy status to other drugs, medicaments and biological substances status: Secondary | ICD-10-CM | POA: Diagnosis not present

## 2016-08-22 DIAGNOSIS — E785 Hyperlipidemia, unspecified: Secondary | ICD-10-CM | POA: Insufficient documentation

## 2016-08-22 DIAGNOSIS — Y92009 Unspecified place in unspecified non-institutional (private) residence as the place of occurrence of the external cause: Secondary | ICD-10-CM

## 2016-08-22 DIAGNOSIS — M19012 Primary osteoarthritis, left shoulder: Secondary | ICD-10-CM | POA: Insufficient documentation

## 2016-08-22 DIAGNOSIS — I2510 Atherosclerotic heart disease of native coronary artery without angina pectoris: Secondary | ICD-10-CM | POA: Diagnosis not present

## 2016-08-22 DIAGNOSIS — Z9889 Other specified postprocedural states: Secondary | ICD-10-CM | POA: Insufficient documentation

## 2016-08-22 DIAGNOSIS — I739 Peripheral vascular disease, unspecified: Secondary | ICD-10-CM | POA: Diagnosis not present

## 2016-08-22 DIAGNOSIS — S0101XA Laceration without foreign body of scalp, initial encounter: Secondary | ICD-10-CM | POA: Diagnosis not present

## 2016-08-22 DIAGNOSIS — R008 Other abnormalities of heart beat: Secondary | ICD-10-CM | POA: Diagnosis not present

## 2016-08-22 DIAGNOSIS — S0101XD Laceration without foreign body of scalp, subsequent encounter: Secondary | ICD-10-CM | POA: Diagnosis not present

## 2016-08-22 DIAGNOSIS — R001 Bradycardia, unspecified: Secondary | ICD-10-CM | POA: Diagnosis present

## 2016-08-22 DIAGNOSIS — I484 Atypical atrial flutter: Secondary | ICD-10-CM | POA: Insufficient documentation

## 2016-08-22 DIAGNOSIS — I420 Dilated cardiomyopathy: Secondary | ICD-10-CM | POA: Diagnosis not present

## 2016-08-22 DIAGNOSIS — M47812 Spondylosis without myelopathy or radiculopathy, cervical region: Secondary | ICD-10-CM | POA: Diagnosis not present

## 2016-08-22 DIAGNOSIS — Y92099 Unspecified place in other non-institutional residence as the place of occurrence of the external cause: Secondary | ICD-10-CM

## 2016-08-22 DIAGNOSIS — I481 Persistent atrial fibrillation: Secondary | ICD-10-CM | POA: Insufficient documentation

## 2016-08-22 DIAGNOSIS — I10 Essential (primary) hypertension: Secondary | ICD-10-CM | POA: Diagnosis present

## 2016-08-22 LAB — CBC WITH DIFFERENTIAL/PLATELET
BASOS PCT: 0 %
Basophils Absolute: 0 10*3/uL (ref 0.0–0.1)
EOS ABS: 0.1 10*3/uL (ref 0.0–0.7)
EOS PCT: 2 %
HCT: 43.4 % (ref 39.0–52.0)
HEMOGLOBIN: 14.3 g/dL (ref 13.0–17.0)
Lymphocytes Relative: 13 %
Lymphs Abs: 0.8 10*3/uL (ref 0.7–4.0)
MCH: 31.4 pg (ref 26.0–34.0)
MCHC: 32.9 g/dL (ref 30.0–36.0)
MCV: 95.2 fL (ref 78.0–100.0)
MONOS PCT: 11 %
Monocytes Absolute: 0.7 10*3/uL (ref 0.1–1.0)
Neutro Abs: 4.7 10*3/uL (ref 1.7–7.7)
Neutrophils Relative %: 74 %
PLATELETS: 139 10*3/uL — AB (ref 150–400)
RBC: 4.56 MIL/uL (ref 4.22–5.81)
RDW: 13.8 % (ref 11.5–15.5)
WBC: 6.4 10*3/uL (ref 4.0–10.5)

## 2016-08-22 LAB — BASIC METABOLIC PANEL
Anion gap: 7 (ref 5–15)
BUN: 32 mg/dL — ABNORMAL HIGH (ref 6–20)
CALCIUM: 8.7 mg/dL — AB (ref 8.9–10.3)
CO2: 26 mmol/L (ref 22–32)
CREATININE: 1.47 mg/dL — AB (ref 0.61–1.24)
Chloride: 105 mmol/L (ref 101–111)
GFR, EST AFRICAN AMERICAN: 45 mL/min — AB (ref >60)
GFR, EST NON AFRICAN AMERICAN: 39 mL/min — AB (ref >60)
GLUCOSE: 109 mg/dL — AB (ref 65–99)
Potassium: 4.7 mmol/L (ref 3.5–5.1)
Sodium: 138 mmol/L (ref 135–145)

## 2016-08-22 LAB — I-STAT TROPONIN, ED: TROPONIN I, POC: 0.02 ng/mL (ref 0.00–0.08)

## 2016-08-22 LAB — MAGNESIUM: Magnesium: 2.1 mg/dL (ref 1.7–2.4)

## 2016-08-22 MED ORDER — LIDOCAINE-EPINEPHRINE (PF) 2 %-1:200000 IJ SOLN
20.0000 mL | Freq: Once | INTRAMUSCULAR | Status: AC
  Administered 2016-08-22: 20 mL
  Filled 2016-08-22: qty 20

## 2016-08-22 MED ORDER — ACETAMINOPHEN 650 MG RE SUPP: 650.0000 mg | Freq: Four times a day (QID) | RECTAL | Status: DC | PRN

## 2016-08-22 MED ORDER — ADULT MULTIVITAMIN W/MINERALS CH
1.0000 | ORAL_TABLET | Freq: Every day | ORAL | Status: DC
  Administered 2016-08-23: 1 via ORAL
  Filled 2016-08-22: qty 1

## 2016-08-22 MED ORDER — PSYLLIUM 95 % PO PACK
1.0000 | PACK | Freq: Every day | ORAL | Status: DC
  Administered 2016-08-23: 1 via ORAL
  Filled 2016-08-22 (×2): qty 1

## 2016-08-22 MED ORDER — PROSIGHT PO TABS
1.0000 | ORAL_TABLET | Freq: Every day | ORAL | Status: DC
  Administered 2016-08-23: 1 via ORAL
  Filled 2016-08-22: qty 1

## 2016-08-22 MED ORDER — ONDANSETRON HCL 4 MG/2ML IJ SOLN: 4.0000 mg | Freq: Four times a day (QID) | INTRAMUSCULAR | Status: DC | PRN

## 2016-08-22 MED ORDER — ALPRAZOLAM 1 MG PO TABS
1.0000 mg | ORAL_TABLET | Freq: Every day | ORAL | Status: DC
  Administered 2016-08-22: 1 mg via ORAL
  Filled 2016-08-22: qty 1

## 2016-08-22 MED ORDER — FUROSEMIDE 20 MG PO TABS
20.0000 mg | ORAL_TABLET | Freq: Every day | ORAL | Status: DC
  Administered 2016-08-23: 20 mg via ORAL
  Filled 2016-08-22: qty 1

## 2016-08-22 MED ORDER — APIXABAN 2.5 MG PO TABS
2.5000 mg | ORAL_TABLET | Freq: Two times a day (BID) | ORAL | Status: DC
  Administered 2016-08-22 – 2016-08-23 (×2): 2.5 mg via ORAL
  Filled 2016-08-22 (×2): qty 1

## 2016-08-22 MED ORDER — ONDANSETRON HCL 4 MG PO TABS: 4.0000 mg | ORAL_TABLET | Freq: Four times a day (QID) | ORAL | Status: DC | PRN

## 2016-08-22 MED ORDER — LINACLOTIDE 145 MCG PO CAPS
145.0000 ug | ORAL_CAPSULE | Freq: Every day | ORAL | Status: DC
  Administered 2016-08-23: 145 ug via ORAL
  Filled 2016-08-22: qty 1

## 2016-08-22 MED ORDER — FINASTERIDE 5 MG PO TABS
5.0000 mg | ORAL_TABLET | Freq: Every day | ORAL | Status: DC
  Administered 2016-08-23: 5 mg via ORAL
  Filled 2016-08-22: qty 1

## 2016-08-22 MED ORDER — SIMVASTATIN 20 MG PO TABS
20.0000 mg | ORAL_TABLET | Freq: Every day | ORAL | Status: DC
  Administered 2016-08-22: 20 mg via ORAL
  Filled 2016-08-22: qty 1

## 2016-08-22 MED ORDER — ACETAMINOPHEN 325 MG PO TABS: 650.0000 mg | ORAL_TABLET | Freq: Four times a day (QID) | ORAL | Status: DC | PRN

## 2016-08-22 NOTE — H&P (Signed)
TRH H&P   Patient Demographics:    [name] [name], is a 82 y.o. male  MRN: [id number]   DOB - 03-15-1920  Admit Date - 08/22/2016  Outpatient Primary MD for the patient is Walker Kehr, MD  Referring MD: Dr. Laverta Baltimore  Outpatient Specialists: Dr. Rodolph Bong  Patient coming from: Home  Chief Complaint  Patient presents with  . Fall  . Head Injury      HPI:    [name] [name]  is a 82 y.o. male, With history of hypertension, known CAD, CKD stage III, osteoarthritis, BPH was brought to the ED after tripping on his yard and fell on his back. He denied loss of consciousness, headache or dizziness. He Hit the back of his scalp and started to bleed. In the ED he was found to be bradycardic in the 30s with EKG showing a regular rhythm and atrial bigeminy. Patient denied any chest and, palpitations, shortness of breath, nausea, vomiting, abdominal pain, bowel or urinary symptoms, tingling or numbness of extremities, weakness of his arms or legs. Denied any change in his medications recently. Patient lives with a friend and at baseline is independent of his ADLs. 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 She denies unsteady gait. He uses a cane occasionally. Denied any fall since his hospitalization about 10 weeks back. Cardiology was consulted who reviewed the EKG and recommended observation on telemetry, holding his amiodarone and beta blocker and will consult officially in the morning.  Patient was hospitalized from 7/11-7/19/2017 with syncope  secondary to rapid A. fib flutter/persistent A. fib requiring rate control and amiodarone. He underwent TEE with DC cardioversion and was discharged home on anticoagulation and amiodarone. He was also found to have new dilated cardiomyopathy with EF of 20%.  Course in the ED Vitals stable except for bradycardic in the  30s. Labs showed normal WBC and hemoglobin, chronic mild thrombocytopenia. Chemistry showed sodium 138, K of 4.7, BUN of 32, creatinine of 1.47, glucose 109. Posterior scalp laceration was stapled in the ED and bleeding stopped. CT of the head and cervical spine was unremarkable for intracranial bleed or subdural hematoma. Showed focal occipital scalp laceration.  Hospitalist consulted for observation on telemetry.      Review of systems:    In addition to the HPI above,  No Fever-chills, No Headache, No changes with Vision or hearing, No problems swallowing food or Liquids, No Chest pain, Cough or Shortness of Breath, No Abdominal pain, No Nausea or Vommitting, Bowel movements are regular, No Blood in stool or Urine, No dysuria, No new skin rashes or bruises, No new joints pains-aches,  No new weakness, tingling, numbness in any extremity, No recent weight gain or loss, No polyuria, polydypsia or polyphagia, No significant Mental Stressors.  A full 10 point Review of Systems was done, except as stated above,  all other Review of Systems were negative.   With Past History of the following :    Past Medical History:  Diagnosis Date  . Anxiety   . Arthritis    "shoulders" (05/28/2016)  . Cardiomyopathy   . Coronary artery disease   . Depression    hx (05/28/2016)  . Hemorrhoids   . Hyperlipidemia   . Hypertension   . Skipped heart beats       Past Surgical History:  Procedure Laterality Date  . APPENDECTOMY    . CARDIOVERSION N/A 06/04/2016   Procedure: CARDIOVERSION;  Surgeon: Pixie Casino, MD;  Location: [facility] ENDOSCOPY;  Service: Cardiovascular;  Laterality: N/A;  . CATARACT EXTRACTION Left   . CHOLECYSTECTOMY OPEN    . COLONOSCOPY W/ BIOPSIES AND POLYPECTOMY    . INGUINAL HERNIA REPAIR Bilateral   . MYRINGOTOMY WITH TUBE PLACEMENT Bilateral   . TEE WITHOUT CARDIOVERSION N/A 06/04/2016   Procedure: TRANSESOPHAGEAL ECHOCARDIOGRAM (TEE);  Surgeon: Pixie Casino,  MD;  Location: [facility] ENDOSCOPY;  Service: Cardiovascular;  Laterality: N/A;  . TONSILLECTOMY AND ADENOIDECTOMY        Social History:     Social History  Substance Use Topics  . Smoking status: Never Smoker  . Smokeless tobacco: Never Used  . Alcohol use No     Lives - With a friend  Mobility - ambulated the     Family History :   No pertinent family history of heart disease, stroke   Home Medications:   Prior to Admission medications   Medication Sig Start Date End Date Taking? Authorizing Provider  ALPRAZolam Duanne Moron) 1 MG tablet Take 1 tablet (1 mg total) by mouth at bedtime. 06/10/16  Yes Minus Breeding, MD  amiodarone (PACERONE) 200 MG tablet TAKE (2) TABLETS DAILY. 07/30/16  Yes Evie Lacks Plotnikov, MD  ELIQUIS 2.5 MG TABS tablet TAKE 1 TABLET TWICE DAILY. 08/09/16  Yes Evie Lacks Plotnikov, MD  finasteride (PROSCAR) 5 MG tablet Take 1 tablet (5 mg total) by mouth daily. 08/20/16 08/20/17 Yes Evie Lacks Plotnikov, MD  furosemide (LASIX) 20 MG tablet Take 1 tablet (20 mg total) by mouth daily. 05/23/15  Yes Cassandria Anger, MD  linaclotide (LINZESS) 145 MCG CAPS capsule Take 1 capsule (145 mcg total) by mouth daily as needed. 07/19/16  Yes Evie Lacks Plotnikov, MD  metoprolol succinate (TOPROL-XL) 50 MG 24 hr tablet Take 25 mg by mouth daily.  05/15/16  Yes Historical Provider, MD  Multiple Vitamin (MULTIVITAMIN WITH MINERALS) TABS tablet Take 1 tablet by mouth daily.   Yes Historical Provider, MD  Multiple Vitamins-Minerals (ICAPS PO) Take 1 capsule by mouth daily.   Yes Historical Provider, MD  psyllium (METAMUCIL) 58.6 % packet Take 1 packet by mouth daily. Reported on 06/07/2016   Yes Historical Provider, MD  simvastatin (ZOCOR) 40 MG tablet Take 20 mg by mouth at bedtime.   Yes Historical Provider, MD  ranitidine (ZANTAC) 150 MG tablet TAKE 1 TABLET TWICE DAILY. Patient not taking: Reported on 08/22/2016 07/08/16   Cassandria Anger, MD     Allergies:     Allergies    Allergen Reactions  . Lisinopril Cough     Physical Exam:   Vitals  Blood pressure 157/86, pulse (!) 42, temperature 97.6 F (36.4 C), temperature source Oral, resp. rate 11, weight 86.2 kg (190 lb), SpO2 99 %.   Gen.: Elderly pleasant male not in distress HEENT: Posterior occipital scalp laceration with staples, no active bleeding, no pallor, moist mucosa,  supple neck Chest: Clear to auscultation bilaterally, no added sounds  CVS: S1 and S2 irregular, bradycardic, no murmurs rub or gallop GI: Soft, nondistended, nontender, bowel sounds present Musculoskeletal: Warm, trace edema, chronic limited mobility of right arm due to arthritis CNS: Alert and oriented   Data Review:    CBC  Recent Labs Lab 08/20/16 0840 08/22/16 1430  WBC 5.3 6.4  HGB 14.6 14.3  HCT 42.9 43.4  PLT 132.0* 139*  MCV 93.9 95.2  MCH  --  31.4  MCHC 33.9 32.9  RDW 13.8 13.8  LYMPHSABS 1.1 0.8  MONOABS 0.7 0.7  EOSABS 0.2 0.1  BASOSABS 0.0 0.0   ------------------------------------------------------------------------------------------------------------------  Chemistries   Recent Labs Lab 08/20/16 0840 08/22/16 1430  NA 138 138  K 4.5 4.7  CL 104 105  CO2 26 26  GLUCOSE 103* 109*  BUN 28* 32*  CREATININE 1.57* 1.47*  CALCIUM 8.3* 8.7*  MG  --  2.1   ------------------------------------------------------------------------------------------------------------------ estimated creatinine clearance is 32.8 mL/min (by C-G formula based on SCr of 1.47 mg/dL (H)). ------------------------------------------------------------------------------------------------------------------ No results for input(s): TSH, T4TOTAL, T3FREE, THYROIDAB in the last 72 hours.  Invalid input(s): FREET3  Coagulation profile No results for input(s): INR, PROTIME in the last 168 hours. ------------------------------------------------------------------------------------------------------------------- No results for  input(s): DDIMER in the last 72 hours. -------------------------------------------------------------------------------------------------------------------  Cardiac Enzymes No results for input(s): CKMB, TROPONINI, MYOGLOBIN in the last 168 hours.  Invalid input(s): CK ------------------------------------------------------------------------------------------------------------------    Component Value Date/Time   BNP 629.5 (H) 05/31/2016 0255     ---------------------------------------------------------------------------------------------------------------  Urinalysis    Component Value Date/Time   COLORURINE YELLOW 05/28/2016 1239   APPEARANCEUR CLEAR 05/28/2016 1239   LABSPEC 1.014 05/28/2016 1239   PHURINE 5.5 05/28/2016 1239   GLUCOSEU NEGATIVE 05/28/2016 1239   GLUCOSEU NEGATIVE 10/05/2015 1237   HGBUR NEGATIVE 05/28/2016 1239   BILIRUBINUR NEGATIVE 05/28/2016 1239   KETONESUR NEGATIVE 05/28/2016 1239   PROTEINUR NEGATIVE 05/28/2016 1239   UROBILINOGEN 0.2 10/05/2015 1237   NITRITE NEGATIVE 05/28/2016 1239   LEUKOCYTESUR NEGATIVE 05/28/2016 1239    ----------------------------------------------------------------------------------------------------------------   Imaging Results:    Ct Head Wo Contrast  Result Date: 08/22/2016 CLINICAL DATA:  Posterior head laceration after a fall this afternoon. Hit head on a brick. Patient is on blood thinners. EXAM: CT HEAD WITHOUT CONTRAST CT CERVICAL SPINE WITHOUT CONTRAST TECHNIQUE: Multidetector CT imaging of the head and cervical spine was performed following the standard protocol without intravenous contrast. Multiplanar CT image reconstructions of the cervical spine were also generated. COMPARISON:  05/28/2016 FINDINGS: CT HEAD FINDINGS Brain: There is moderate central and cortical atrophy. Periventricular white matter changes are consistent with small vessel disease. There is no intra or extra-axial fluid collection or mass lesion.  The basilar cisterns and ventricles have a normal appearance. There is no CT evidence for acute infarction or hemorrhage. Vascular: No hyperdense vessel or unexpected calcification. Skull: Normal. Negative for fracture or focal lesion. Sinuses/Orbits: No acute finding. Other: Focal scalp laceration identified in the occipital region, not associated with fracture. CT CERVICAL SPINE FINDINGS Alignment: There is loss of cervical lordosis. This may be secondary to splinting, soft tissue injury, or positioning. Skull base and vertebrae: No acute fracture. No primary bone lesion or focal pathologic process. Soft tissues and spinal canal: No prevertebral fluid or swelling. No visible canal hematoma. Disc levels: Degenerative changes are identified primarily at C4-5 and C6- C7. 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 Upper chest: Unremarkable.  Other: Common carotid calcifications noted. IMPRESSION: 1.  No evidence for acute intracranial abnormality. 2. Atrophy and small vessel disease. 3. Focal laceration of the scalp in the occipital region. 4. Degenerative changes in the cervical spine. 5.  No evidence for acute cervical spine abnormality. Electronically Signed   By: Nolon Nations M.D.   On: 08/22/2016 15:30   Ct Cervical Spine Wo Contrast  Result Date: 08/22/2016 CLINICAL DATA:  Posterior head laceration after a fall this afternoon. Hit head on a brick. Patient is on blood thinners. EXAM: CT HEAD WITHOUT CONTRAST CT CERVICAL SPINE WITHOUT CONTRAST TECHNIQUE: Multidetector CT imaging of the head and cervical spine was performed following the standard protocol without intravenous contrast. Multiplanar CT image reconstructions of the cervical spine were also generated. COMPARISON:  05/28/2016 FINDINGS: CT HEAD FINDINGS Brain: There is moderate central and cortical atrophy. Periventricular white matter changes are consistent with small vessel disease. There is no intra or  extra-axial fluid collection or mass lesion. The basilar cisterns and ventricles have a normal appearance. There is no CT evidence for acute infarction or hemorrhage. Vascular: No hyperdense vessel or unexpected calcification. Skull: Normal. Negative for fracture or focal lesion. Sinuses/Orbits: No acute finding. Other: Focal scalp laceration identified in the occipital region, not associated with fracture. CT CERVICAL SPINE FINDINGS Alignment: There is loss of cervical lordosis. This may be secondary to splinting, soft tissue injury, or positioning. Skull base and vertebrae: No acute fracture. No primary bone lesion or focal pathologic process. Soft tissues and spinal canal: No prevertebral fluid or swelling. No visible canal hematoma. Disc levels: Degenerative changes are identified primarily at C4-5 and C6- C7. 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 Upper chest: Unremarkable. Other: Common carotid calcifications noted. IMPRESSION: 1.  No evidence for acute intracranial abnormality. 2. Atrophy and small vessel disease. 3. Focal laceration of the scalp in the occipital region. 4. Degenerative changes in the cervical spine. 5.  No evidence for acute cervical spine abnormality. Electronically Signed   By: Nolon Nations M.D.   On: 08/22/2016 15:30    My personal review of EKG: irregular rhythm with atrial bigeminy, old LAFB, Qtc 509   Assessment & Plan:    Principal Problem:   Atrial bigeminy Possibly contributed by his beta blocker and MA drawn. Will to be held. Observe on telemetry. Ordered bedside pacer. Normal potassium and magnesium. Cardiology consulted by ED physician. Will evaluate in the morning.  Active Problems:   Essential hypertension Stable. Hold off beta blocker. Resume his Lasix.  Dilated cardiomyopathy Seen during last hospitalization with EF of 20%. Currently euvolemic. Continue Lasix. Holding beta blocker. Not on ACEi due to cough. Not  on ARB due to CKD. Monitor daily weight and I/O    BPH (benign prostatic hyperplasia) Continue Proscar.    Fall at home with scalp laceration Reportedly has had few falls in the past which appear mechanical. Will obtain physical therapy.    CKD (chronic kidney disease) stage 3, GFR 30-59 ml/min Function at baseline. Continue to monitor    Thrombocytopenia (Chronic) Stable.    Persistent atrial fibrillation (HCC) Status post DC cardioversion. Hold beta blocker and amiodarone. Continue anti-aggregation.        DVT Prophylaxis : On systemic and aggregation  AM Labs Ordered, also please review Full Orders  Family Communication: Discussed plan with patient. Updated son on the phone.  Code Status full code  Likely DC to  home  Condition fair  Consults called: Cardiology   Admission status: Observation  Time spent in minutes : 60 Minutes   Louellen Molder M.D on 08/22/2016 at 4:54 PM  Between 7am to 7pm - Pager - [phone]. After 7pm go to [email or web address] - password [facility]  [facility] - Office  [phone]

## 2016-08-22 NOTE — ED Provider Notes (Signed)
Emergency Department Provider Note   I have reviewed the triage vital signs and the nursing notes.   HISTORY  Chief Complaint Fall and Head Injury   HPI [name] [name] is a 82 y.o. male with PMH of CAD, HLD, A-fib on Eliquis, HTN, and cardiomyopathy who presents to the emergency department for evaluation of mechanical fall on blood thinner. The patient takes Eliquis for his irregular heartbeat. He has been compliant with his medication. Patient states that he was outside working in the yard when his feet got "tangled up" and he fell backward striking his head on a brick. No loss of consciousness. No vomiting since the incident. He was found by a neighbor who assisted him and called EMS. Patient denies any preceding chest pain, palpitations, lightheadedness. No recent illness. No medication changes.   Past Medical History:  Diagnosis Date  . Anxiety   . Arthritis    "shoulders" (05/28/2016)  . Cardiomyopathy   . Coronary artery disease   . Depression    hx (05/28/2016)  . Hemorrhoids   . Hyperlipidemia   . Hypertension   . Skipped heart beats     Patient Active Problem List   Diagnosis Date Noted  . Atrial bigeminy 08/22/2016  . Bradycardia 08/22/2016  . Knee pain, bilateral 07/17/2016  . Traumatic hematoma of right upper arm 06/07/2016  . Laceration of head 06/07/2016  . PAF (paroxysmal atrial fibrillation) ([place])   . Demand ischemia ([place]) 06/01/2016  . Systolic CHF, chronic ([place]) 06/01/2016  . Faintness   . Congestive dilated cardiomyopathy ([place])   . Abrasion, multiple sites   . Atypical atrial flutter ([place])   . Persistent atrial fibrillation ([place])   . Fall 05/28/2016  . Scalp laceration 05/28/2016  . Contusion of arm, right 05/28/2016  . Syncope and collapse 05/28/2016  . History of sinus bradycardia 05/28/2016  . Contusion of right forearm 05/28/2016  . CKD (chronic kidney disease) stage 3, GFR 30-59 ml/min 05/28/2016  . Acute hyperkalemia 05/28/2016  .  Thrombocytopenia (CHRONIC) 05/28/2016  . Contusion of head   . Elevated troponin   . Constipation 10/10/2015  . Angular stomatitis 11/21/2014  . Erectile dysfunction 11/21/2014  . Rash and nonspecific skin eruption 07/01/2014  . DOE (dyspnea on exertion) 03/08/2014  . Laceration of right hand 12/21/2013  . Traumatic hematoma of forehead 12/21/2013  . Fall at home 12/21/2013  . Eczema 11/02/2013  . Paresthesia 03/29/2013  . Pain in joint, shoulder region 12/28/2012  . Neoplasm of uncertain behavior of skin 11/28/2011  . Actinic keratoses 11/28/2011  . Osteoarthritis 11/28/2011  . Well adult exam 11/28/2011  . Hemorrhoids, internal, with bleeding s/p banding 11/14/2011  . HYPERKERATOSIS 12/26/2009  . Essential hypertension 06/02/2009  . Malignant neoplasm of skin of parts of face 04/12/2008  . INSOMNIA 04/12/2008  . Dyslipidemia 09/29/2007  . ANXIETY DEPRESSION 09/29/2007  . Coronary atherosclerosis 09/29/2007  . BPH (benign prostatic hyperplasia) 09/29/2007    Past Surgical History:  Procedure Laterality Date  . APPENDECTOMY    . CARDIOVERSION N/A 06/04/2016   Procedure: CARDIOVERSION;  Surgeon: Pixie Casino, MD;  Location: [facility] ENDOSCOPY;  Service: Cardiovascular;  Laterality: N/A;  . CATARACT EXTRACTION Left   . CHOLECYSTECTOMY OPEN    . COLONOSCOPY W/ BIOPSIES AND POLYPECTOMY    . INGUINAL HERNIA REPAIR Bilateral   . MYRINGOTOMY WITH TUBE PLACEMENT Bilateral   . TEE WITHOUT CARDIOVERSION N/A 06/04/2016   Procedure: TRANSESOPHAGEAL ECHOCARDIOGRAM (TEE);  Surgeon: Pixie Casino, MD;  Location[facility]  ENDOSCOPY;  Service: Cardiovascular;  Laterality: N/A;  . TONSILLECTOMY AND ADENOIDECTOMY      Current Outpatient Rx  . Order #: [id number] Class: Print  . Order #: [id number] Class: Normal  . Order #: [id number] Class: Normal  . Order #: [id number] Class: Normal  . Order #: [id number] Class: Normal  . Order #: [id number] Class: Sample  . Order #: [id number] Class: Historical Med  .  Order #: [id number] Class: Historical Med  . Order #: [id number] Class: Historical Med  . Order #: [id number] Class: Historical Med  . Order #: [id number] Class: Historical Med  . Order #: [id number] Class: Normal    Allergies Lisinopril  History reviewed. No pertinent family history.  Social History Social History  Substance Use Topics  . Smoking status: Never Smoker  . Smokeless tobacco: Never Used  . Alcohol use No    Review of Systems  Constitutional: No fever/chills Eyes: No visual changes. ENT: No sore throat. Cardiovascular: Denies chest pain. Respiratory: Denies shortness of breath. Gastrointestinal: No abdominal pain.  No nausea, no vomiting.  No diarrhea.  No constipation. Genitourinary: Negative for dysuria. Musculoskeletal: Negative for back pain. Skin: Negative for rash. Neurological: Negative for focal weakness or numbness. Positive mild HA and scalp laceration.   10-point ROS otherwise negative.  ____________________________________________   PHYSICAL EXAM:  VITAL SIGNS: ED Triage Vitals  Enc Vitals Group     BP 08/22/16 1409 125/62     Pulse Rate 08/22/16 1409 (!) 31     Resp 08/22/16 1409 16     Temp 08/22/16 1409 97.6 F (36.4 C)     Temp Source 08/22/16 1409 Oral     SpO2 08/22/16 1409 96 %     Weight 08/22/16 1411 190 lb (86.2 kg)     Pain Score 08/22/16 1420 0   Constitutional: Alert and oriented. Well appearing and in no acute distress. Eyes: Conjunctivae are normal. PERRL.  Head: Atraumatic. Nose: No congestion/rhinnorhea. Mouth/Throat: Mucous membranes are moist.  Oropharynx non-erythematous. Neck: No stridor.  Cardiovascular: Irregular rhythm. Good peripheral circulation. Grossly normal heart sounds.   Respiratory: Normal respiratory effort.  No retractions. Lungs CTAB. Gastrointestinal: Soft and nontender. No distention.  Musculoskeletal: No lower extremity tenderness nor edema. No gross deformities of extremities. Neurologic:  Normal  speech and language. No gross focal neurologic deficits are appreciated.  Skin:  Skin is warm, dry and intact. 1.5 cm posterior scalp laceration.  Psychiatric: Mood and affect are normal. Speech and behavior are normal.  ____________________________________________   LABS (all labs ordered are listed, but only abnormal results are displayed)  Labs Reviewed  BASIC METABOLIC PANEL - Abnormal; Notable for the following:       Result Value   Glucose, Bld 109 (*)    BUN 32 (*)    Creatinine, Ser 1.47 (*)    Calcium 8.7 (*)    GFR calc non Af Amer 39 (*)    GFR calc Af Amer 45 (*)    All other components within normal limits  CBC WITH DIFFERENTIAL/PLATELET - Abnormal; Notable for the following:    Platelets 139 (*)    All other components within normal limits  MAGNESIUM  I-STAT TROPOININ, ED   ____________________________________________  EKG   EKG Interpretation  Date/Time:  Thursday August 22 2016 14:20:54 EDT Ventricular Rate:  65 PR Interval:    QRS Duration: 145 QT Interval:  489 QTC Calculation: 509 R Axis:   -77 Text Interpretation:  Sinus rhythm RBBB and LAFB No STEMI. Sinus arrhythmia noted.  Confirmed by LONG MD, JOSHUA [phone])  on 08/22/2016 2:26:47 PM       ____________________________________________  RADIOLOGY  Ct Head Wo Contrast  Result Date: 08/22/2016 CLINICAL DATA:  Posterior head laceration after a fall this afternoon. Hit head on a brick. Patient is on blood thinners. EXAM: CT HEAD WITHOUT CONTRAST CT CERVICAL SPINE WITHOUT CONTRAST TECHNIQUE: Multidetector CT imaging of the head and cervical spine was performed following the standard protocol without intravenous contrast. Multiplanar CT image reconstructions of the cervical spine were also generated. COMPARISON:  05/28/2016 FINDINGS: CT HEAD FINDINGS Brain: There is moderate central and cortical atrophy. Periventricular white matter changes are consistent with small vessel disease. There is no intra or  extra-axial fluid collection or mass lesion. The basilar cisterns and ventricles have a normal appearance. There is no CT evidence for acute infarction or hemorrhage. Vascular: No hyperdense vessel or unexpected calcification. Skull: Normal. Negative for fracture or focal lesion. Sinuses/Orbits: No acute finding. Other: Focal scalp laceration identified in the occipital region, not associated with fracture. CT CERVICAL SPINE FINDINGS Alignment: There is loss of cervical lordosis. This may be secondary to splinting, soft tissue injury, or positioning. Skull base and vertebrae: No acute fracture. No primary bone lesion or focal pathologic process. Soft tissues and spinal canal: No prevertebral fluid or swelling. No visible canal hematoma. Disc levels: Degenerative changes are identified primarily at C4-5 and C6- C7. 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 Upper chest: Unremarkable. Other: Common carotid calcifications noted. IMPRESSION: 1.  No evidence for acute intracranial abnormality. 2. Atrophy and small vessel disease. 3. Focal laceration of the scalp in the occipital region. 4. Degenerative changes in the cervical spine. 5.  No evidence for acute cervical spine abnormality. Electronically Signed   By: Nolon Nations M.D.   On: 08/22/2016 15:30   Ct Cervical Spine Wo Contrast  Result Date: 08/22/2016 CLINICAL DATA:  Posterior head laceration after a fall this afternoon. Hit head on a brick. Patient is on blood thinners. EXAM: CT HEAD WITHOUT CONTRAST CT CERVICAL SPINE WITHOUT CONTRAST TECHNIQUE: Multidetector CT imaging of the head and cervical spine was performed following the standard protocol without intravenous contrast. Multiplanar CT image reconstructions of the cervical spine were also generated. COMPARISON:  05/28/2016 FINDINGS: CT HEAD FINDINGS Brain: There is moderate central and cortical atrophy. Periventricular white matter changes are consistent with  small vessel disease. There is no intra or extra-axial fluid collection or mass lesion. The basilar cisterns and ventricles have a normal appearance. There is no CT evidence for acute infarction or hemorrhage. Vascular: No hyperdense vessel or unexpected calcification. Skull: Normal. Negative for fracture or focal lesion. Sinuses/Orbits: No acute finding. Other: Focal scalp laceration identified in the occipital region, not associated with fracture. CT CERVICAL SPINE FINDINGS Alignment: There is loss of cervical lordosis. This may be secondary to splinting, soft tissue injury, or positioning. Skull base and vertebrae: No acute fracture. No primary bone lesion or focal pathologic process. Soft tissues and spinal canal: No prevertebral fluid or swelling. No visible canal hematoma. Disc levels: Degenerative changes are identified primarily at C4-5 and C6- C7. 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 Upper chest: Unremarkable. Other: Common carotid calcifications noted. IMPRESSION: 1.  No evidence for acute intracranial abnormality. 2. Atrophy and small vessel disease. 3. Focal laceration of the scalp in the occipital region. 4. Degenerative changes in the cervical spine. 5.  No evidence for acute cervical spine abnormality. Electronically Signed  By: Nolon Nations M.D.   On: 08/22/2016 15:30    ____________________________________________   PROCEDURES  Procedure(s) performed:   Procedures  Laceration repair:  Area anesthetized using lidocaine w/ epi. Wound irrigated copiously with sterile saline. Wound then cleaned with Betadine and draped in sterile fashion. Wound closed using staples (#2). Good wound approximation and hemostasis achieved.   ____________________________________________   INITIAL IMPRESSION / ASSESSMENT AND PLAN / ED COURSE  Pertinent labs & imaging results that were available during my care of the patient were reviewed by me and  considered in my medical decision making (see chart for details).  Patient resents to the emergency department for evaluation of likely mechanical fall at home on Eliquis. He has a small laceration to posterior scalp. Patient is awake and alert. He does have an irregular apparently sinus rhythm on initial EKG with bradycardia. No clear symptoms associated with this patient did have a fall at home. Plan for baseline labs including electrolytes and magnesium. Some intermittent flutter on tele.   04:12 PM Spoke with Dr. Arlyce Dice with Cardiology who has seen the EKG and believes this to be atrial bigeminy. Plan to hold home amiodiornone and toporol and monitor on telemetry. Cardiology to consult in the AM. Will page hospitalist for admission.   Discussed patient's case with hospitalist, Dr. Clementeen Graham.  Recommend admission to obs, telemetry bed.  I will place holding orders per their request. Patient and family (if present) updated with plan. Care transferred to hospitalist service.  I reviewed all nursing notes, vitals, pertinent old records, EKGs, labs, imaging (as available).  ____________________________________________  FINAL CLINICAL IMPRESSION(S) / ED DIAGNOSES  Final diagnoses:  Bradycardia     MEDICATIONS GIVEN DURING THIS VISIT:  Medications  lidocaine-EPINEPHrine (XYLOCAINE W/EPI) 2 %-1:200000 (PF) injection 20 mL (20 mLs Infiltration Given by Other 08/22/16 1530)     NEW OUTPATIENT MEDICATIONS STARTED DURING THIS VISIT:  None   Note:  This document was prepared using Dragon voice recognition software and may include unintentional dictation errors.  Nanda Quinton, MD Emergency Medicine   Margette Fast, MD 08/22/16 [phone]

## 2016-08-22 NOTE — ED Notes (Signed)
MD at bedside. 

## 2016-08-22 NOTE — ED Notes (Signed)
Pt can go to floor at 17:12 

## 2016-08-22 NOTE — ED Notes (Signed)
Patient transported to CT 

## 2016-08-22 NOTE — ED Triage Notes (Signed)
Pt c/o posterior head laceration after a fall this afternoon.  Denies pain.  Sts he hit is head on a brick.  Pt reports that his feet got "tangled up" causing the fall.  Pt is on blood thinners.  Bleeding is controlled.  Pt is at neuro baseline, per visitor.

## 2016-08-22 NOTE — ED Notes (Signed)
Patient's neighbor:  Alvy Beal [phone]

## 2016-08-23 ENCOUNTER — Encounter (HOSPITAL_COMMUNITY): Payer: Self-pay | Admitting: Cardiology

## 2016-08-23 DIAGNOSIS — I480 Paroxysmal atrial fibrillation: Secondary | ICD-10-CM

## 2016-08-23 DIAGNOSIS — I10 Essential (primary) hypertension: Secondary | ICD-10-CM | POA: Diagnosis not present

## 2016-08-23 DIAGNOSIS — R001 Bradycardia, unspecified: Secondary | ICD-10-CM | POA: Diagnosis not present

## 2016-08-23 DIAGNOSIS — I5022 Chronic systolic (congestive) heart failure: Secondary | ICD-10-CM

## 2016-08-23 DIAGNOSIS — S0101XD Laceration without foreign body of scalp, subsequent encounter: Secondary | ICD-10-CM | POA: Diagnosis not present

## 2016-08-23 DIAGNOSIS — I498 Other specified cardiac arrhythmias: Secondary | ICD-10-CM

## 2016-08-23 LAB — CBC
HCT: 42.5 % (ref 39.0–52.0)
HEMOGLOBIN: 13.8 g/dL (ref 13.0–17.0)
MCH: 31.2 pg (ref 26.0–34.0)
MCHC: 32.5 g/dL (ref 30.0–36.0)
MCV: 96.2 fL (ref 78.0–100.0)
PLATELETS: 122 10*3/uL — AB (ref 150–400)
RBC: 4.42 MIL/uL (ref 4.22–5.81)
RDW: 13.7 % (ref 11.5–15.5)
WBC: 6.3 10*3/uL (ref 4.0–10.5)

## 2016-08-23 NOTE — Discharge Summary (Signed)
Physician Discharge Summary  [name] [id number] DOB: Dec 11, 1919 DOA: 08/22/2016  PCP: Walker Kehr, MD  Admit date: 08/22/2016 Discharge date: 08/23/2016  Admitted From: home Disposition:  home  Recommendations for Outpatient Follow-up:  Follow up with Dr Percival Spanish on 09/02/2016  Home Health: Home health PT recommended but patient refused Equipment/Devices: Encouraged to use cane for ambulation.  Discharge Condition: Stable CODE STATUS: Full code Diet recommendation: Heart Healthy    Discharge Diagnoses:  Principal Problem:   Atrial bigeminy   Active Problems:   Essential hypertension   BPH (benign prostatic hyperplasia)   Fall at home   Scalp laceration   CKD (chronic kidney disease) stage 3, GFR 30-59 ml/min   Thrombocytopenia (CHRONIC)   Systolic CHF, chronic (HCC)   PAF (paroxysmal atrial fibrillation) (HCC)   Bradycardia  Brief narrative/history of present illness  [name] [name]  is a 82 y.o. male, With history of hypertension, known CAD, CKD stage III, osteoarthritis, BPH was brought to the ED after tripping on his yard and fell on his back. He denied loss of consciousness, headache or dizziness. He Hit the back of his scalp and started to bleed. In the ED he was found to be bradycardic in the 30s with EKG showing a regular rhythm and atrial bigeminy. Patient denied any chest and, palpitations, shortness of breath, nausea, vomiting, abdominal pain, bowel or urinary symptoms, tingling or numbness of extremities, weakness of his arms or legs. Denied any change in his medications recently. Patient lives with a friend and at baseline is independent of his ADLs. 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 She denies unsteady gait. He uses a cane occasionally. Denied any fall since his hospitalization about 10 weeks back. Cardiology was consulted who reviewed the EKG and recommended observation on telemetry, holding his amiodarone and  beta blocker and will consult officially in the morning.  Patient was hospitalized from 7/11-7/19/2017 with syncope  secondary to rapid A. fib flutter/persistent A. fib requiring rate control and amiodarone. He underwent TEE with DC cardioversion and was discharged home on anticoagulation and amiodarone. He was also found to have new dilated cardiomyopathy with EF of 20%.  Course in the ED Vitals stable except for bradycardic in the 30s. Labs showed normal WBC and hemoglobin, chronic mild thrombocytopenia. Chemistry showed sodium 138, K of 4.7, BUN of 32, creatinine of 1.47, glucose 109. Posterior scalp laceration was stapled in the ED and bleeding stopped. CT of the head and cervical spine was unremarkable for intracranial bleed or subdural hematoma. Showed focal occipital scalp laceration.  Hospitalist consulted for observation on telemetry.  Hospital course   Principal Problem:   Atrial bigeminy Possibly contributed by his beta blocker and amiodarone which were both held.  Normal potassium and magnesium. Observed on telemetry. Evaluated by cardiology. Atrial bigeminy with effective heart rate transiently dropping down to 30. Shows ectopy. No pauses on telemetry. Recommend to stop amiodarone and hold beta blocker for now. Patient has follow-up with his cardiologist in 10 days and will decide on restarting beta blocker if heart rate stable. No indication for pacemaker at this time per cardiology.  Stable for discharge home with outpatient follow-up.  Active Problems:     mechanical Fall at home with scalp laceration  Reportedly has had few falls in the past which appear mechanical. Seen by physical therapy and recommended home health with PT but patient refused. I have encouraged him to use his cane for ambulation.  Essential hypertension Stable. Continue Lasix. Off beta blocker.  Dilated cardiomyopathy Seen during last hospitalization with EF of 20%. Currently euvolemic.  Continue Lasix. Holding beta blocker. Not on ACEi due to cough. Not on ARB due to CKD.     BPH (benign prostatic hyperplasia) Continue Proscar.      CKD (chronic kidney disease) stage 3, GFR 30-59 ml/min Stable at baseline    Thrombocytopenia (Chronic) Stable.    Persistent atrial fibrillation (HCC) Status post DC cardioversion. Hold beta blocker and amiodarone. Continue anticoagulation.    Disposition: Home  Family Communication:  son updated on the phone on admission  Discharge Instructions     Medication List    STOP taking these medications   amiodarone 200 MG tablet Commonly known as:  PACERONE   metoprolol succinate 50 MG 24 hr tablet Commonly known as:  TOPROL-XL   ranitidine 150 MG tablet Commonly known as:  ZANTAC     TAKE these medications   ALPRAZolam 1 MG tablet Commonly known as:  XANAX Take 1 tablet (1 mg total) by mouth at bedtime.   ELIQUIS 2.5 MG Tabs tablet Generic drug:  apixaban TAKE 1 TABLET TWICE DAILY.   finasteride 5 MG tablet Commonly known as:  PROSCAR Take 1 tablet (5 mg total) by mouth daily.   furosemide 20 MG tablet Commonly known as:  LASIX Take 1 tablet (20 mg total) by mouth daily.   ICAPS PO Take 1 capsule by mouth daily.   linaclotide 145 MCG Caps capsule Commonly known as:  LINZESS Take 1 capsule (145 mcg total) by mouth daily as needed.   multivitamin with minerals Tabs tablet Take 1 tablet by mouth daily.   psyllium 58.6 % packet Commonly known as:  METAMUCIL Take 1 packet by mouth daily. Reported on 06/07/2016   simvastatin 40 MG tablet Commonly known as:  ZOCOR Take 20 mg by mouth at bedtime.       Allergies  Allergen Reactions  . Lisinopril Cough      Procedures/Studies: Ct Head Wo Contrast  Result Date: 08/22/2016 CLINICAL DATA:  Posterior head laceration after a fall this afternoon. Hit head on a brick. Patient is on blood thinners. EXAM: CT HEAD WITHOUT CONTRAST CT CERVICAL SPINE  WITHOUT CONTRAST TECHNIQUE: Multidetector CT imaging of the head and cervical spine was performed following the standard protocol without intravenous contrast. Multiplanar CT image reconstructions of the cervical spine were also generated. COMPARISON:  05/28/2016 FINDINGS: CT HEAD FINDINGS Brain: There is moderate central and cortical atrophy. Periventricular white matter changes are consistent with small vessel disease. There is no intra or extra-axial fluid collection or mass lesion. The basilar cisterns and ventricles have a normal appearance. There is no CT evidence for acute infarction or hemorrhage. Vascular: No hyperdense vessel or unexpected calcification. Skull: Normal. Negative for fracture or focal lesion. Sinuses/Orbits: No acute finding. Other: Focal scalp laceration identified in the occipital region, not associated with fracture. CT CERVICAL SPINE FINDINGS Alignment: There is loss of cervical lordosis. This may be secondary to splinting, soft tissue injury, or positioning. Skull base and vertebrae: No acute fracture. No primary bone lesion or focal pathologic process. Soft tissues and spinal canal: No prevertebral fluid or swelling. No visible canal hematoma. Disc levels: Degenerative changes are identified primarily at C4-5 and C6- C7. 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 Upper chest: Unremarkable. Other: Common carotid calcifications noted. IMPRESSION: 1.  No evidence for acute intracranial abnormality. 2. Atrophy and  small vessel disease. 3. Focal laceration of the scalp in the occipital region. 4. Degenerative changes in the cervical spine. 5.  No evidence for acute cervical spine abnormality. Electronically Signed   By: Nolon Nations M.D.   On: 08/22/2016 15:30   Ct Cervical Spine Wo Contrast  Result Date: 08/22/2016 CLINICAL DATA:  Posterior head laceration after a fall this afternoon. Hit head on a brick. Patient is on blood thinners. EXAM: CT  HEAD WITHOUT CONTRAST CT CERVICAL SPINE WITHOUT CONTRAST TECHNIQUE: Multidetector CT imaging of the head and cervical spine was performed following the standard protocol without intravenous contrast. Multiplanar CT image reconstructions of the cervical spine were also generated. COMPARISON:  05/28/2016 FINDINGS: CT HEAD FINDINGS Brain: There is moderate central and cortical atrophy. Periventricular white matter changes are consistent with small vessel disease. There is no intra or extra-axial fluid collection or mass lesion. The basilar cisterns and ventricles have a normal appearance. There is no CT evidence for acute infarction or hemorrhage. Vascular: No hyperdense vessel or unexpected calcification. Skull: Normal. Negative for fracture or focal lesion. Sinuses/Orbits: No acute finding. Other: Focal scalp laceration identified in the occipital region, not associated with fracture. CT CERVICAL SPINE FINDINGS Alignment: There is loss of cervical lordosis. This may be secondary to splinting, soft tissue injury, or positioning. Skull base and vertebrae: No acute fracture. No primary bone lesion or focal pathologic process. Soft tissues and spinal canal: No prevertebral fluid or swelling. No visible canal hematoma. Disc levels: Degenerative changes are identified primarily at C4-5 and C6- C7. 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 Upper chest: Unremarkable. Other: Common carotid calcifications noted. IMPRESSION: 1.  No evidence for acute intracranial abnormality. 2. Atrophy and small vessel disease. 3. Focal laceration of the scalp in the occipital region. 4. Degenerative changes in the cervical spine. 5.  No evidence for acute cervical spine abnormality. Electronically Signed   By: Nolon Nations M.D.   On: 08/22/2016 15:30       Subjective: Not in distress, in sinus bradycardia, HR in 50s  Discharge Exam: Vitals:   08/22/16 2107 08/23/16 0414  BP: 136/62 (!)  151/70  Pulse: (!) 55 (!) 50  Resp: 18 18  Temp: 97.7 F (36.5 C) 97.6 F (36.4 C)   Vitals:   08/22/16 1744 08/22/16 2107 08/23/16 0414 08/23/16 1136  BP: (!) 176/79 136/62 (!) 151/70   Pulse: 87 (!) 55 (!) 50   Resp: 20 18 18    Temp: 97.6 F (36.4 C) 97.7 F (36.5 C) 97.6 F (36.4 C)   TempSrc: Oral Oral Oral   SpO2: 98% 98% 96%   Weight:      Height:    5\' 8"  (1.727 m)    Gen.: Elderly pleasant male not in distress HEENT: Posterior occipital scalp laceration with staples, no active bleeding,  moist mucosa, supple neck Chest: Clear to auscultation bilaterally, no added sounds  CVS: S1 and S2 irregular, bradycardic, no murmurs rub or gallop GI: Soft, nondistended, nontender, bowel sounds present Musculoskeletal: Warm, trace edema,  CNS: Alert and oriented    The results of significant diagnostics from this hospitalization (including imaging, microbiology, ancillary and laboratory) are listed below for reference.     Microbiology: No results found for this or any previous visit (from the past 240 hour(s)).   Labs: BNP (last 3 results)  Recent Labs  05/31/16 0255  BNP [id number]*   Basic Metabolic Panel:  Recent Labs Lab 08/20/16  0840 08/22/16 1430  NA 138 138  K 4.5 4.7  CL 104 105  CO2 26 26  GLUCOSE 103* 109*  BUN 28* 32*  CREATININE 1.57* 1.47*  CALCIUM 8.3* 8.7*  MG  --  2.1   Liver Function Tests: No results for input(s): AST, ALT, ALKPHOS, BILITOT, PROT, ALBUMIN in the last 168 hours. No results for input(s): LIPASE, AMYLASE in the last 168 hours. No results for input(s): AMMONIA in the last 168 hours. CBC:  Recent Labs Lab 08/20/16 0840 08/22/16 1430 08/23/16 0416  WBC 5.3 6.4 6.3  NEUTROABS 3.4 4.7  --   HGB 14.6 14.3 13.8  HCT 42.9 43.4 42.5  MCV 93.9 95.2 96.2  PLT 132.0* 139* 122*   Cardiac Enzymes: No results for input(s): CKTOTAL, CKMB, CKMBINDEX, TROPONINI in the last 168 hours. BNP: Invalid input(s): POCBNP CBG: No results  for input(s): GLUCAP in the last 168 hours. D-Dimer No results for input(s): DDIMER in the last 72 hours. Hgb A1c No results for input(s): HGBA1C in the last 72 hours. Lipid Profile No results for input(s): CHOL, HDL, LDLCALC, TRIG, CHOLHDL, LDLDIRECT in the last 72 hours. Thyroid function studies No results for input(s): TSH, T4TOTAL, T3FREE, THYROIDAB in the last 72 hours.  Invalid input(s): FREET3 Anemia work up No results for input(s): VITAMINB12, FOLATE, FERRITIN, TIBC, IRON, RETICCTPCT in the last 72 hours. Urinalysis    Component Value Date/Time   COLORURINE YELLOW 05/28/2016 1239   APPEARANCEUR CLEAR 05/28/2016 1239   LABSPEC 1.014 05/28/2016 1239   PHURINE 5.5 05/28/2016 1239   GLUCOSEU NEGATIVE 05/28/2016 [place] 10/05/2015 1237   HGBUR NEGATIVE 05/28/2016 1239   BILIRUBINUR NEGATIVE 05/28/2016 1239   KETONESUR NEGATIVE 05/28/2016 1239   PROTEINUR NEGATIVE 05/28/2016 1239   UROBILINOGEN 0.2 10/05/2015 1237   NITRITE NEGATIVE 05/28/2016 1239   LEUKOCYTESUR NEGATIVE 05/28/2016 1239   Sepsis Labs Invalid input(s): PROCALCITONIN,  WBC,  LACTICIDVEN Microbiology No results found for this or any previous visit (from the past 240 hour(s)).   Time coordinating discharge: < 30 minutes  SIGNED:   Louellen Molder, MD  [facility] 08/23/2016, 12:45 PM Pager   If 7PM-7AM, please contact night-coverage [email or web address] Password [email or web address]

## 2016-08-23 NOTE — Evaluation (Signed)
Physical Therapy Evaluation-1x Patient Details Name: [name] [name] MRN: [id number] DOB: 03-08-1920 Today's Date: 08/23/2016   History of Present Illness  82 yo male admitted after mechanical fall at home. He sustained a posterior head laceration. 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   Clinical Impression  On eval, pt was Min guard assist for mobility. He walked ~250 feet without a device. He was slightly unsteady at times but there was no overt LOB requiring external assistance to correct. Pt reports he does have balance issues. Instructed pt to use his cane for ambulation. Recommend HHPT follow up for general strength and balance training if he is agreeable.     Follow Up Recommendations Home health PT;Supervision - Intermittent (if pt is agreeable)    Equipment Recommendations  None recommended by PT    Recommendations for Other Services       Precautions / Restrictions Precautions Precautions: Fall Restrictions Weight Bearing Restrictions: No      Mobility  Bed Mobility Overal bed mobility: Modified Independent             General bed mobility comments: increased time  Transfers Overall transfer level: Needs assistance Equipment used: None Transfers: Sit to/from Stand Sit to Stand: Supervision         General transfer comment: for safety  Ambulation/Gait Ambulation/Gait assistance: Min guard Ambulation Distance (Feet): 250 Feet Assistive device: None Gait Pattern/deviations: Step-through pattern;Decreased stride length     General Gait Details: close guard for safety.   Stairs Stairs: Yes Stairs assistance: Min guard Stair Management: Step to pattern;Forwards;One rail Left Number of Stairs: 3 General stair comments: close guard for safety  Wheelchair Mobility    Modified Rankin (Stroke Patients Only)       Balance Overall balance assessment: History of Falls                                            Pertinent Vitals/Pain Pain Assessment: No/denies pain    Home Living Family/patient expects to be discharged to:: Private residence Living Arrangements: Alone Available Help at Discharge: Family;Friend(s);Available PRN/intermittently Type of Home: House Home Access: Stairs to enter Entrance Stairs-Rails: Psychiatric nurse of Steps: 2-3 Home Layout: One level Home Equipment: Cane - single point Additional Comments: RW, rollator (deceased wife's DME)    Prior Function Level of Independence: Independent with assistive device(s)         Comments: uses cane sometimes     Hand Dominance        Extremity/Trunk Assessment   Upper Extremity Assessment: Generalized weakness;LUE deficits/detail       LUE Deficits / Details: decreased shoulder flexion   Lower Extremity Assessment: Generalized weakness      Cervical / Trunk Assessment: Normal  Communication   Communication: No difficulties  Cognition Arousal/Alertness: Awake/alert Behavior During Therapy: WFL for tasks assessed/performed Overall Cognitive Status: Within Functional Limits for tasks assessed                      General Comments      Exercises     Assessment/Plan    PT Assessment All further PT needs can be met in the next venue of care (HHPT if pt is agreeable)  PT Problem List Decreased range of motion;Decreased mobility;Decreased balance          PT Treatment Interventions  PT Goals (Current goals can be found in the Care Plan section)  Acute Rehab PT Goals Patient Stated Goal: home soon Time For Goal Achievement: 09/06/16 Potential to Achieve Goals: Good    Frequency     Barriers to discharge        Co-evaluation               End of Session Equipment Utilized During Treatment: Gait belt Activity Tolerance: Patient tolerated treatment well Patient left: in bed;with call bell/phone within reach;with bed alarm set      Functional  Assessment Tool Used: clinical judgement Functional Limitation: Mobility: Walking and moving around Mobility: Walking and Moving Around Current Status ([id number]): At least 1 percent but less than 20 percent impaired, limited or restricted Mobility: Walking and Moving Around Goal Status [phone]): At least 1 percent but less than 20 percent impaired, limited or restricted Mobility: Walking and Moving Around Discharge Status [phone]): At least 1 percent but less than 20 percent impaired, limited or restricted    Time: 1333-1356 PT Time Calculation (min) (ACUTE ONLY): 23 min   Charges:   PT Evaluation $PT Eval Low Complexity: 1 Procedure     PT G Codes:   PT G-Codes **NOT FOR INPATIENT CLASS** Functional Assessment Tool Used: clinical judgement Functional Limitation: Mobility: Walking and moving around Mobility: Walking and Moving Around Current Status (I0165): At least 1 percent but less than 20 percent impaired, limited or restricted Mobility: Walking and Moving Around Goal Status [phone]): At least 1 percent but less than 20 percent impaired, limited or restricted Mobility: Walking and Moving Around Discharge Status [phone]): At least 1 percent but less than 20 percent impaired, limited or restricted    Weston Anna, MPT Pager: [phone]

## 2016-08-23 NOTE — Progress Notes (Signed)
Spoke with pt concerning HHPT. Pt states that his son is a retire PT, he has not need for HHPT at this present time.

## 2016-08-23 NOTE — Consult Note (Addendum)
CONSULT    Reason for Consult:    Referring Physician: Dr Clementeen Graham   PCP:  Walker Kehr, MD  Primary Cardiologist:Dr. Nachmen Mansel is an 82 y.o. male.    Chief Complaint: admitted 08/22/16 with fall and head injury   HPI:  Asked to see 63 yom with hx HTN, CKD-3, BPH and CAD and cardiomyopathy in July with presentation for syncope and head trauma.  EF was 20% and new a fib.  He underwent Tee/DCCV that was successful and placed on Eliquis and amiodarone.   CHA2DS2 - VASc score of 5 with a risk of stroke of 6.7%. His amiodarone is at 200 mg daily.  On last OV he was bradycardic and his BB was reduced.    Now presents with fall, mechanical. He stated he got his feet tangled and fell hitting his head on a brick.  He denied any loss of consciousness.  In ER HR was in the 30s.  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  He did have a scalp laceration.   Amio held and BB.   Overnight SB with PACs freq.  Troponin 0.02.  Cr. 1.57 to 1.47.  CT of head without abnormality.  BP is elevated this AM.    Currently pt denies any chest pain or SOB.  No dizziness but he has been more fatigued since July.  He was only taking amiodarone 200 mg once daily.       Past Medical History:  Diagnosis Date  . Anxiety   . Arthritis    "shoulders" (05/28/2016)  . Cardiomyopathy   . Coronary artery disease   . Depression    hx (05/28/2016)  . Hemorrhoids   . Hyperlipidemia   . Hypertension   . Skipped heart beats     Past Surgical History:  Procedure Laterality Date  . APPENDECTOMY    . CARDIOVERSION N/A 06/04/2016   Procedure: CARDIOVERSION;  Surgeon: Pixie Casino, MD;  Location: [facility] ENDOSCOPY;  Service: Cardiovascular;  Laterality: N/A;  . CATARACT EXTRACTION Left   . CHOLECYSTECTOMY OPEN    . COLONOSCOPY W/ BIOPSIES AND POLYPECTOMY    . INGUINAL HERNIA REPAIR Bilateral   . MYRINGOTOMY WITH TUBE PLACEMENT Bilateral   . TEE WITHOUT CARDIOVERSION N/A 06/04/2016   Procedure: TRANSESOPHAGEAL ECHOCARDIOGRAM (TEE);  Surgeon: Pixie Casino, MD;  Location: [facility] ENDOSCOPY;  Service: Cardiovascular;  Laterality: N/A;  . TONSILLECTOMY AND ADENOIDECTOMY      Family History  Problem Relation Age of Onset  . Heart disease Brother    Social History:  reports that he has never smoked. He has never used smokeless tobacco. He reports that he does not drink alcohol or use drugs.  Allergies:  Allergies  Allergen Reactions  . Lisinopril Cough    OUTPATIENT MEDICATIONS: No current facility-administered medications on file prior to encounter.    Current Outpatient Prescriptions on File Prior to Encounter  Medication Sig Dispense Refill  . ALPRAZolam (XANAX) 1 MG tablet Take 1 tablet (1 mg total) by mouth at bedtime. 30 tablet 0  . amiodarone (PACERONE) 200 MG tablet TAKE (2) TABLETS DAILY. 60 tablet 5  . ELIQUIS 2.5 MG TABS tablet TAKE 1 TABLET TWICE DAILY. 60 tablet 11  . finasteride (PROSCAR) 5 MG tablet Take 1 tablet (5 mg total) by mouth daily. 90 tablet 3  . furosemide (LASIX) 20 MG tablet Take 1 tablet (20 mg total) by mouth daily. 90 tablet 3  . linaclotide (  LINZESS) 145 MCG CAPS capsule Take 1 capsule (145 mcg total) by mouth daily as needed. 16 capsule 0  . metoprolol succinate (TOPROL-XL) 50 MG 24 hr tablet Take 25 mg by mouth daily.   0  . psyllium (METAMUCIL) 58.6 % packet Take 1 packet by mouth daily. Reported on 06/07/2016    . simvastatin (ZOCOR) 40 MG tablet Take 20 mg by mouth at bedtime.    . ranitidine (ZANTAC) 150 MG tablet TAKE 1 TABLET TWICE DAILY. (Patient not taking: Reported on 08/22/2016) 60 tablet 2  WAS only taking amiodarone once daily  CURRENT MEDICATIONS:  Scheduled Meds: . ALPRAZolam  1 mg Oral QHS  . apixaban  2.5 mg Oral BID  . finasteride  5 mg Oral Daily  . furosemide  20 mg Oral Daily  . linaclotide  145 mcg Oral QAC breakfast  . multivitamin  1 tablet Oral Daily  . multivitamin with minerals  1 tablet Oral Daily  .  psyllium  1 packet Oral Daily  . simvastatin  20 mg Oral QHS   Continuous Infusions:  PRN Meds:.acetaminophen **OR** acetaminophen, ondansetron **OR** ondansetron (ZOFRAN) IV   Results for orders placed or performed during the hospital encounter of 08/22/16 (from the past 48 hour(s))  Basic metabolic panel     Status: Abnormal   Collection Time: 08/22/16  2:30 PM  Result Value Ref Range   Sodium 138 135 - 145 mmol/L   Potassium 4.7 3.5 - 5.1 mmol/L   Chloride 105 101 - 111 mmol/L   CO2 26 22 - 32 mmol/L   Glucose, Bld 109 (H) 65 - 99 mg/dL   BUN 32 (H) 6 - 20 mg/dL   Creatinine, Ser 1.47 (H) 0.61 - 1.24 mg/dL   Calcium 8.7 (L) 8.9 - 10.3 mg/dL   GFR calc non Af Amer 39 (L) >60 mL/min   GFR calc Af Amer 45 (L) >60 mL/min    Comment: (NOTE) The eGFR has been calculated using the CKD EPI equation. This calculation has not been validated in all clinical situations. eGFR's persistently <60 mL/min signify possible Chronic Kidney Disease.    Anion gap 7 5 - 15  Magnesium     Status: None   Collection Time: 08/22/16  2:30 PM  Result Value Ref Range   Magnesium 2.1 1.7 - 2.4 mg/dL  CBC with Differential     Status: Abnormal   Collection Time: 08/22/16  2:30 PM  Result Value Ref Range   WBC 6.4 4.0 - 10.5 K/uL   RBC 4.56 4.22 - 5.81 MIL/uL   Hemoglobin 14.3 13.0 - 17.0 g/dL   HCT 43.4 39.0 - 52.0 %   MCV 95.2 78.0 - 100.0 fL   MCH 31.4 26.0 - 34.0 pg   MCHC 32.9 30.0 - 36.0 g/dL   RDW 13.8 11.5 - 15.5 %   Platelets 139 (L) 150 - 400 K/uL   Neutrophils Relative % 74 %   Neutro Abs 4.7 1.7 - 7.7 K/uL   Lymphocytes Relative 13 %   Lymphs Abs 0.8 0.7 - 4.0 K/uL   Monocytes Relative 11 %   Monocytes Absolute 0.7 0.1 - 1.0 K/uL   Eosinophils Relative 2 %   Eosinophils Absolute 0.1 0.0 - 0.7 K/uL   Basophils Relative 0 %   Basophils Absolute 0.0 0.0 - 0.1 K/uL  I-stat troponin, ED     Status: None   Collection Time: 08/22/16  2:38 PM  Result Value Ref Range   Troponin i, poc  0.02 0.00 - 0.08 ng/mL   Comment 3            Comment: Due to the release kinetics of cTnI, a negative result within the first hours of the onset of symptoms does not rule out myocardial infarction with certainty. If myocardial infarction is still suspected, repeat the test at appropriate intervals.   CBC     Status: Abnormal   Collection Time: 08/23/16  4:16 AM  Result Value Ref Range   WBC 6.3 4.0 - 10.5 K/uL   RBC 4.42 4.22 - 5.81 MIL/uL   Hemoglobin 13.8 13.0 - 17.0 g/dL   HCT 42.5 39.0 - 52.0 %   MCV 96.2 78.0 - 100.0 fL   MCH 31.2 26.0 - 34.0 pg   MCHC 32.5 30.0 - 36.0 g/dL   RDW 13.7 11.5 - 15.5 %   Platelets 122 (L) 150 - 400 K/uL   Ct Head Wo Contrast  Result Date: 08/22/2016 CLINICAL DATA:  Posterior head laceration after a fall this afternoon. Hit head on a brick. Patient is on blood thinners. EXAM: CT HEAD WITHOUT CONTRAST CT CERVICAL SPINE WITHOUT CONTRAST TECHNIQUE: Multidetector CT imaging of the head and cervical spine was performed following the standard protocol without intravenous contrast. Multiplanar CT image reconstructions of the cervical spine were also generated. COMPARISON:  05/28/2016 FINDINGS: CT HEAD FINDINGS Brain: There is moderate central and cortical atrophy. Periventricular white matter changes are consistent with small vessel disease. There is no intra or extra-axial fluid collection or mass lesion. The basilar cisterns and ventricles have a normal appearance. There is no CT evidence for acute infarction or hemorrhage. Vascular: No hyperdense vessel or unexpected calcification. Skull: Normal. Negative for fracture or focal lesion. Sinuses/Orbits: No acute finding. Other: Focal scalp laceration identified in the occipital region, not associated with fracture. CT CERVICAL SPINE FINDINGS Alignment: There is loss of cervical lordosis. This may be secondary to splinting, soft tissue injury, or positioning. Skull base and vertebrae: No acute fracture. No primary  bone lesion or focal pathologic process. Soft tissues and spinal canal: No prevertebral fluid or swelling. No visible canal hematoma. Disc levels: Degenerative changes are identified primarily at C4-5 and C6- C7. 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 Upper chest: Unremarkable. Other: Common carotid calcifications noted. IMPRESSION: 1.  No evidence for acute intracranial abnormality. 2. Atrophy and small vessel disease. 3. Focal laceration of the scalp in the occipital region. 4. Degenerative changes in the cervical spine. 5.  No evidence for acute cervical spine abnormality. Electronically Signed   By: Nolon Nations M.D.   On: 08/22/2016 15:30   Ct Cervical Spine Wo Contrast  Result Date: 08/22/2016 CLINICAL DATA:  Posterior head laceration after a fall this afternoon. Hit head on a brick. Patient is on blood thinners. EXAM: CT HEAD WITHOUT CONTRAST CT CERVICAL SPINE WITHOUT CONTRAST TECHNIQUE: Multidetector CT imaging of the head and cervical spine was performed following the standard protocol without intravenous contrast. Multiplanar CT image reconstructions of the cervical spine were also generated. COMPARISON:  05/28/2016 FINDINGS: CT HEAD FINDINGS Brain: There is moderate central and cortical atrophy. Periventricular white matter changes are consistent with small vessel disease. There is no intra or extra-axial fluid collection or mass lesion. The basilar cisterns and ventricles have a normal appearance. There is no CT evidence for acute infarction or hemorrhage. Vascular: No hyperdense vessel or unexpected calcification. Skull: Normal. Negative for fracture or focal lesion. Sinuses/Orbits: No acute finding.  Other: Focal scalp laceration identified in the occipital region, not associated with fracture. CT CERVICAL SPINE FINDINGS Alignment: There is loss of cervical lordosis. This may be secondary to splinting, soft tissue injury, or positioning. Skull base and  vertebrae: No acute fracture. No primary bone lesion or focal pathologic process. Soft tissues and spinal canal: No prevertebral fluid or swelling. No visible canal hematoma. Disc levels: Degenerative changes are identified primarily at C4-5 and C6- C7. 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 Upper chest: Unremarkable. Other: Common carotid calcifications noted. IMPRESSION: 1.  No evidence for acute intracranial abnormality. 2. Atrophy and small vessel disease. 3. Focal laceration of the scalp in the occipital region. 4. Degenerative changes in the cervical spine. 5.  No evidence for acute cervical spine abnormality. Electronically Signed   By: Nolon Nations M.D.   On: 08/22/2016 15:30    ROS: General:no colds or fevers, no weight changes Skin:no rashes or ulcers HEENT:no blurred vision, no congestion, now scalp laceration CV:see HPI PUL:see HPI GI:no diarrhea constipation or melena, no indigestion GU:no hematuria, no dysuria MS:no joint pain, no claudication Neuro:no syncope, no lightheadedness Endo:no diabetes, no thyroid disease   Blood pressure (!) 151/70, pulse (!) 50, temperature 97.6 F (36.4 C), temperature source Oral, resp. rate 18, weight 190 lb (86.2 kg), SpO2 96 %.  Wt Readings from Last 3 Encounters:  08/22/16 190 lb (86.2 kg)  08/20/16 190 lb (86.2 kg)  07/17/16 185 lb (83.9 kg)    PE: General:Pleasant affect, NAD Skin:Warm and dry, brisk capillary refill HEENT:normocephalic, sclera clear, mucus membranes moist, blood post scalp, mildly hard of hearing Neck:supple, no JVD, no bruits  Heart:S1S2 irregular without murmur, gallup, rub or click Lungs:clear without rales, rhonchi, or wheezes TAV:WPVX, non tender, + BS, do not palpate liver spleen or masses Ext:no lower ext edema, 1+ pedal pulses, 2+ radial pulses Neuro:alert and oriented X 3, MAE, follows commands, + facial symmetry   Assessment/Plan Principal Problem:   Atrial  bigeminy Active Problems:   Essential hypertension   BPH (benign prostatic hyperplasia)   Fall at home   Scalp laceration   CKD (chronic kidney disease) stage 3, GFR 30-59 ml/min   Thrombocytopenia (CHRONIC)   Systolic CHF, chronic (HCC)   PAF (paroxysmal atrial fibrillation) (HCC)   Bradycardia  1. PAF has been in SB since TEE/DCCV in July his amiodarone was to have been decreased in July but pt seems to have been taking 400 mg of amiodarone instead of 200 mg.  With stopping both BB and amiodarone HR is climbing.   He does have RBBB, 1st degree AV Block, and LAFB.    -tachy brady possible need for PPM to treat PAF and CM. -MD to see.  2. Cardiomyopathy with EF 20%.  If tolerates may need to resume BB in future.  Has allergy to ACE and ARB has been held due to CKD.  Currently stable..  3. Chronic systolic HF euvolemic    Cecilie Kicks  Nurse Practitioner Certified [place] Pager [phone] or after 5pm or weekends call [phone] 08/23/2016, 9:04 AM    Personally seen and examined. Agree with above.  82 year old with PAF, AMIO, RBBB, LAFB, atrial bigeminy with effective HR transiently 30.   No adverse pauses on tele.  RRR with ectopy noted. No JVD, lungs clear.    - Stopped amiodarone. No pacer indication at this time.   - Consider restarting Bb if HR  stable. EF 20%  - No signs of HF.  - Clearly mechanical fall in the garden  - Consider DC today with close follow up with APP in Dr. Rosezella [place] [facility] to consider restarting a dose of metoprolol.    Candee Furbish, MD

## 2016-08-27 ENCOUNTER — Telehealth: Payer: Self-pay | Admitting: Cardiology

## 2016-08-27 ENCOUNTER — Inpatient Hospital Stay: Payer: Medicare Other | Admitting: Internal Medicine

## 2016-08-27 NOTE — Telephone Encounter (Signed)
New Message  Pt voiced two staples in back of head from fall and discharge papers to have reg doc remove staples.   Pt voiced wanting to know if our office can remove staples and not regular doctor.  Please f/u with pt

## 2016-08-27 NOTE — Telephone Encounter (Signed)
Line busy when dialed.  Note that we do have suture and staple removal kits on-hand (in 3rd supply closet near [employer] [address] [employer]).

## 2016-08-28 NOTE — Telephone Encounter (Signed)
Pt will have to go to PCP Pt notified

## 2016-08-28 NOTE — Telephone Encounter (Signed)
Pt.notified

## 2016-08-30 ENCOUNTER — Other Ambulatory Visit: Payer: Self-pay

## 2016-08-30 MED ORDER — SIMVASTATIN 40 MG PO TABS: 20.0000 mg | ORAL_TABLET | Freq: Every day | ORAL | 6 refills | Status: DC

## 2016-09-02 ENCOUNTER — Ambulatory Visit (INDEPENDENT_AMBULATORY_CARE_PROVIDER_SITE_OTHER): Payer: Medicare Other | Admitting: Physician Assistant

## 2016-09-02 ENCOUNTER — Encounter: Payer: Self-pay | Admitting: Physician Assistant

## 2016-09-02 VITALS — BP 130/80 | Wt 189.2 lb

## 2016-09-02 DIAGNOSIS — R001 Bradycardia, unspecified: Secondary | ICD-10-CM | POA: Diagnosis not present

## 2016-09-02 DIAGNOSIS — N183 Chronic kidney disease, stage 3 unspecified: Secondary | ICD-10-CM

## 2016-09-02 DIAGNOSIS — I2510 Atherosclerotic heart disease of native coronary artery without angina pectoris: Secondary | ICD-10-CM

## 2016-09-02 DIAGNOSIS — I480 Paroxysmal atrial fibrillation: Secondary | ICD-10-CM | POA: Diagnosis not present

## 2016-09-02 DIAGNOSIS — I10 Essential (primary) hypertension: Secondary | ICD-10-CM | POA: Diagnosis not present

## 2016-09-02 DIAGNOSIS — I5022 Chronic systolic (congestive) heart failure: Secondary | ICD-10-CM

## 2016-09-02 MED ORDER — METOPROLOL SUCCINATE ER 25 MG PO TB24: 25.0000 mg | ORAL_TABLET | Freq: Every day | ORAL | 2 refills | Status: DC

## 2016-09-02 MED ORDER — SIMVASTATIN 20 MG PO TABS: 20.0000 mg | ORAL_TABLET | Freq: Every day | ORAL | 2 refills | Status: DC

## 2016-09-02 NOTE — Patient Instructions (Signed)
Medication Instructions:   RESTART METOPROLOL ER 25MG    Testing/Procedures: NONE    Follow-Up: 1 MONTH WITH HAO MENG,PA   If you need a refill on your cardiac medications before your next appointment, please call your pharmacy.

## 2016-09-02 NOTE — Progress Notes (Signed)
Cardiology Office Note    Date:  09/02/2016   ID:  [name] [name], DOB 1920/06/26, MRN ID:[id number]  PCP:  Walker Kehr, MD  Cardiologist:  Dr. Percival Spanish  Chief Complaint  Patient presents with  . Follow-up    seen for Dr. Percival Spanish, recent ED visit with fall and noted to have HR in 30s, amiodarone and BB stopped    History of Present Illness:  [name] [name] is a 82 y.o. male with PMH of HTN, CKD stage III, BPH, CAD, chronic bradycardia, PAF on eliquis and chronic systolic HF with baseline EF 20%. 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 Unfortunately he lost both of his wife and their daughter within a period of a week years ago. He lives by himself with his girlfriend sometimes coming over. He previously presented to the hospital on 06/01/2016 with a syncopal episode. He was found to be in atrial fibrillation/atrial flutter on EKG. CT of the head showed no intracranial abnormality. He was started on eliquis and a diltiazem drip. Echocardiogram obtained on 05/29/2016 showed EF 20-25%, diffuse hypokinesis, mild AI, moderately reduced RVEF. Diltiazem drip was initially started for rate control progress, later switched to Toprol-XL 75 mg given his LV dysfunction. He underwent TEE DCCV on 06/04/2016 with successful conversion to sinus bradycardia after a single 150 J biphasic shock. He was started on PO amiodarone. 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 Since discharge, he has went back to the emergency room again on 08/23/2016 after presenting with a mechanical fall. He states he caught his feet tangled and fell hitting his head on a brick. He has a resultant scalp laceration. Heart rate in the emergency room was in the 30s, sinus bradycardia with PACs, RBBB, first degree AV block and LAFB. It turned out that although patient was supposed to be taking 200 mg daily of amiodarone, and in fact he was taking 400 mg daily instead. His amiodarone was stopped, beta blocker was also stopped  with recommendation of restarting beta blocker if heart rate is stable.  Patient presents today for cardiology follow-up, he denies any significant dizziness, chest discomfort or shortness of breath. He has not had any further falls since recent ED visit. I will restart Toprol-XL at 25 mg daily. I would not restart amiodarone given the concern of further bradycardia. He says he is still having trouble receiving medication to his mail-in pharmacy, while he is waiting for his Zocor to arrive, I have agreed to give him 20 mg daily Zocor with 30 day supply to his local pharmacy. Otherwise he is doing well. I plan to see him back in one month to reassess the heart rate.   Past Medical History:  Diagnosis Date  . Anxiety   . Arthritis    "shoulders" (05/28/2016)  . Cardiomyopathy   . Coronary artery disease   . Depression    hx (05/28/2016)  . Hemorrhoids   . Hyperlipidemia   . Hypertension   . Skipped heart beats     Past Surgical History:  Procedure Laterality Date  . APPENDECTOMY    . CARDIOVERSION N/A 06/04/2016   Procedure: CARDIOVERSION;  Surgeon: Pixie Casino, MD;  Location: [facility] ENDOSCOPY;  Service: Cardiovascular;  Laterality: N/A;  . CATARACT EXTRACTION Left   . CHOLECYSTECTOMY OPEN    . COLONOSCOPY W/ BIOPSIES AND POLYPECTOMY    . INGUINAL HERNIA REPAIR Bilateral   . MYRINGOTOMY WITH TUBE PLACEMENT Bilateral   . TEE WITHOUT CARDIOVERSION N/A  06/04/2016   Procedure: TRANSESOPHAGEAL ECHOCARDIOGRAM (TEE);  Surgeon: Pixie Casino, MD;  Location: [facility] ENDOSCOPY;  Service: Cardiovascular;  Laterality: N/A;  . TONSILLECTOMY AND ADENOIDECTOMY      Current Medications: Outpatient Medications Prior to Visit  Medication Sig Dispense Refill  . ALPRAZolam (XANAX) 1 MG tablet Take 1 tablet (1 mg total) by mouth at bedtime. 30 tablet 0  . ELIQUIS 2.5 MG TABS tablet TAKE 1 TABLET TWICE DAILY. 60 tablet 11  . finasteride (PROSCAR) 5 MG tablet Take 1 tablet (5 mg total) by mouth daily. 90  tablet 3  . furosemide (LASIX) 20 MG tablet Take 1 tablet (20 mg total) by mouth daily. 90 tablet 3  . linaclotide (LINZESS) 145 MCG CAPS capsule Take 1 capsule (145 mcg total) by mouth daily as needed. 16 capsule 0  . Multiple Vitamin (MULTIVITAMIN WITH MINERALS) TABS tablet Take 1 tablet by mouth daily.    . Multiple Vitamins-Minerals (ICAPS PO) Take 1 capsule by mouth daily.    . psyllium (METAMUCIL) 58.6 % packet Take 1 packet by mouth daily. Reported on 06/07/2016    . simvastatin (ZOCOR) 40 MG tablet Take 0.5 tablets (20 mg total) by mouth at bedtime. 30 tablet 6   No facility-administered medications prior to visit.      Allergies:   Lisinopril   Social History   Social History  . Marital status: Widowed    Spouse name: N/A  . Number of children: N/A  . Years of education: N/A   Social History Main Topics  . Smoking status: Never Smoker  . Smokeless tobacco: Never Used  . Alcohol use No  . Drug use: No  . Sexual activity: Yes   Other Topics Concern  . None   Social History Narrative  . None     Family History:  The patient's family history includes Heart disease in his brother.   ROS:   Please see the history of present illness.    ROS All other systems reviewed and are negative.   PHYSICAL EXAM:   VS:  BP 130/80   Wt 189 lb 3.2 oz (85.8 kg)   SpO2 97%   BMI 28.77 kg/m    GEN: Well nourished, well developed, in no acute distress  HEENT: normal  Neck: no JVD, carotid bruits, or masses Cardiac: RRR; no murmurs, rubs, or gallops,no edema  Respiratory:  clear to auscultation bilaterally, normal work of breathing GI: soft, nontender, nondistended, + BS MS: no deformity or atrophy  Skin: warm and dry, no rash Neuro:  Alert and Oriented x 3, Strength and sensation are intact Psych: euthymic mood, full affect  Wt Readings from Last 3 Encounters:  09/02/16 189 lb 3.2 oz (85.8 kg)  08/22/16 190 lb (86.2 kg)  08/20/16 190 lb (86.2 kg)      Studies/Labs  Reviewed:   EKG:  EKG is ordered today.  The ekg ordered today demonstrates Sinus rhythm with bifascicular block, heart rate 63.  Recent Labs: 12/14/2015: ALT 10 05/31/2016: B Natriuretic Peptide 629.5 08/22/2016: BUN 32; Creatinine, Ser 1.47; Magnesium 2.1; Potassium 4.7; Sodium 138 08/23/2016: Hemoglobin 13.8; Platelets 122   Lipid Panel    Component Value Date/Time   CHOL 110 05/25/2013 1020   TRIG 83.0 05/25/2013 1020   HDL 39.00 (L) 05/25/2013 1020   CHOLHDL 3 05/25/2013 1020   VLDL 16.6 05/25/2013 1020   LDLCALC 54 05/25/2013 1020    Additional studies/ records that were reviewed today include:   Echo 05/29/2016 LV  EF: 20% -   25%  ------------------------------------------------------------------- Indications:      Syncope 780.2.  ------------------------------------------------------------------- History:   PMH:   Coronary artery disease.  Cardiomyopathy of unknown etiology.  Risk factors:  Hypertension.  ------------------------------------------------------------------- Study Conclusions  - Left ventricle: The cavity size was normal. There was mild   concentric hypertrophy. Systolic function was severely reduced.   The estimated ejection fraction was in the range of 20% to 25%.   Diffuse hypokinesis. - Aortic valve: There was mild regurgitation. - Aortic root: The aortic root was normal in size. - Mitral valve: Structurally normal valve. There was mild   regurgitation. - Left atrium: The atrium was moderately dilated. - Right ventricle: Systolic function was moderately reduced. - Right atrium: The atrium was normal in size. - Tricuspid valve: There was mild regurgitation. - Pulmonic valve: Structurally normal valve. There was no   regurgitation. - Pulmonary arteries: Systolic pressure was within the normal   range. - Inferior vena cava: The vessel was normal in size. - Pericardium, extracardiac: There was no pericardial effusion.  Impressions:  -  There is severe LV systolic impairement and moderate RV systolic   impairement.   The EF evaluation might be underestimated by atrial fibrillation   with RVR during the acquisition.   TEE DCCV 06/04/2016  Impression:  1. No LAA thrombus - chickenwing appendage. 2. Negative for PFO 3. Mild MR, AI, TR and mild to moderate PI 4. Aortic valve sclerosis 5. Severe global hypokinesis - LVEF 20-25% 6. Successful DCCV with a single 150J biphasic shock to sinus bradycardia  Recommendations:  1. Discontinue IV amiodarone. Start po amiodarone 400 mg daily tomorrow. 2. Hold Toprol XL today and restart tomorrow at reduced dose of 50 mg daily if HR >50. 3. Continue Eliquis 2.5 mg BID (he is due for a 10:00 am dose today) 4. Right forearm IV discontinued - there appears to be swelling, ecchymosis and edema of that arm. Elevated and warm compresses are recommended. 5. May need short term rehabilitation stay - son says no one is at home with the patient to assist him.   CT head wo contrast 08/22/2016 IMPRESSION: 1.  No evidence for acute intracranial abnormality. 2. Atrophy and small vessel disease. 3. Focal laceration of the scalp in the occipital region. 4. Degenerative changes in the cervical spine. 5.  No evidence for acute cervical spine abnormality.   ASSESSMENT:    1. Bradycardia   2. PAF (paroxysmal atrial fibrillation) ([place])   3. Essential hypertension   4. CKD (chronic kidney disease), stage III   5. Coronary artery disease involving native coronary artery of native heart without angina pectoris   6. Chronic systolic heart failure (HCC)      PLAN:  In order of problems listed above:  1. Bradycardia, his heart rate has certainly improved since amiodarone and Toprol-XL has been stopped. Given history of paroxysmal atrial fibrillation and recent DC cardioversion, I will start restart Toprol-XL at very low dose 25 mg daily. Would not try to uptitrate beta blocker anytime  soon.  2. Paroxysmal atrial fibrillation  - Admitted in July 2017, underwent TEE DCCV on 06/04/2016 with successful conversion to sinus bradycardia after a single 150 J biphasic shock  - This patients CHA2DS2-VASc Score and unadjusted Ischemic Stroke Rate (% per year) is equal to 4.8 % stroke rate/year from a score of 4  Above score calculated as 1 point each if present [CHF, HTN, DM, Vascular=MI/PAD/Aortic Plaque, Age if 6-74, or Male]  Above score calculated as 2 points each if present [Age > 75, or Stroke/TIA/TE]  - Used to be on 75 mg Toprol-XL daily and amiodarone 200 mg daily. Found to have severe bradycardia during recent hospital visit, turned out patient was actually taking amiodarone 400 mg daily on top of Toprol-XL. Both medications were stopped and his heart rate improved.  3. Presumed CAD?: I cannot locate any record of previous cardiac catheterization. According to previous cardiology note, it appears he had a abnormal stress test years ago that was managed medically.  4. Chronic systolic heart failure: No sign of volume overload, given significant LV dysfunction noted on echocardiogram 05/29/2016, we'll restart Toprol-XL at a lower dose 25 mg daily.  5. Hypertension: Blood pressure well-controlled.  6. CKD III: Renal function stable on recent lab, creatinine 1.47 on 08/22/2016.   Medication Adjustments/Labs and Tests Ordered: Current medicines are reviewed at length with the patient today.  Concerns regarding medicines are outlined above.  Medication changes, Labs and Tests ordered today are listed in the Patient Instructions below. Patient Instructions  Medication Instructions:   RESTART METOPROLOL ER 25MG    Testing/Procedures: NONE    Follow-Up: 1 MONTH WITH [place]   If you need a refill on your cardiac medications before your next appointment, please call your pharmacy.      Hilbert Corrigan, [place]  09/02/2016 6:24 PM    [address], [place], [place]  [address] Phone: [phone]; Fax: [phone]

## 2016-09-03 ENCOUNTER — Encounter: Payer: Self-pay | Admitting: Family

## 2016-09-03 ENCOUNTER — Ambulatory Visit (INDEPENDENT_AMBULATORY_CARE_PROVIDER_SITE_OTHER): Payer: Medicare Other | Admitting: Family

## 2016-09-03 VITALS — BP 144/84 | HR 80 | Temp 97.4°F | Resp 14 | Ht 68.0 in | Wt 188.0 lb

## 2016-09-03 DIAGNOSIS — S0101XD Laceration without foreign body of scalp, subsequent encounter: Secondary | ICD-10-CM

## 2016-09-03 DIAGNOSIS — I498 Other specified cardiac arrhythmias: Secondary | ICD-10-CM

## 2016-09-03 NOTE — Assessment & Plan Note (Signed)
Atrial bigeminy noted upon auscultation with normal heart rate and no evidence of dizziness or heart palpitations. He has restarted metoprolol per cardiology instructions. Continue current dosage of medications with changes per cardiology.

## 2016-09-03 NOTE — Assessment & Plan Note (Signed)
Scalp laceration with good approximation. Staples removed without complication and tolerated well by the patient. Continue to monitor for signs of infection and keep clean using soap and water.Return precautions provided.

## 2016-09-03 NOTE — Progress Notes (Signed)
Subjective:    Patient ID: [name] [name], male    DOB: 1920-03-21, 82 y.o.   MRN: [id number]  No chief complaint on file.   HPI:  [name] [name] is a 82 y.o. male who  has a past medical history of Anxiety; Arthritis; Cardiomyopathy; Coronary artery disease; Depression; Hemorrhoids; Hyperlipidemia; Hypertension; and Skipped heart beats. and presents today   Recently evaluated in the emergency department and admitted to the hospital for evaluation of mechanical fall on a blood thinner. He was noted to have an irregular heartbeat popping maintained on Eliquis. Describes he was outside working in the yard when he got tangled up and fell backwards striking his head on the back break. There is no loss of consciousness. He was found by a neighbor who assisted him and called EMS. CT of the head was negative for intracranial abnormalities or bleeding with atrophy and small vessel disease present. A focal laceration was noted on the scalp in the occipital region. CT scan of the cervical spine was negative for acute cervical spine abnormality or fracture. EKG at the time showed atrial bigeminy. His amiodarone was stopped and beta blocker was held until followed up by cardiology as outpatient. The wound was repaired using staples. It was recommended for home health physical therapy, however patient declined. Advised to use cane for ambulation. All ED records, imaging, and I work was reviewed in detail.  Since leaving the hospital he has had no further symptoms of dizziness or bradycardia. He does have some knee pain. He did refuse home health physical therapy with encouragement for ambulatory assistance with a cane. Able to complete his activities of living back at baseline. He has a son that is a physical therapist and will be helping with his recovery. The wound has remained clean and dry with no evidence of infection.  Allergies  Allergen Reactions  . Lisinopril Cough      Outpatient Medications Prior to  Visit  Medication Sig Dispense Refill  . ALPRAZolam (XANAX) 1 MG tablet Take 1 tablet (1 mg total) by mouth at bedtime. 30 tablet 0  . ELIQUIS 2.5 MG TABS tablet TAKE 1 TABLET TWICE DAILY. 60 tablet 11  . finasteride (PROSCAR) 5 MG tablet Take 1 tablet (5 mg total) by mouth daily. 90 tablet 3  . furosemide (LASIX) 20 MG tablet Take 1 tablet (20 mg total) by mouth daily. 90 tablet 3  . linaclotide (LINZESS) 145 MCG CAPS capsule Take 1 capsule (145 mcg total) by mouth daily as needed. 16 capsule 0  . metoprolol succinate (TOPROL XL) 25 MG 24 hr tablet Take 1 tablet (25 mg total) by mouth daily. 30 tablet 2  . Multiple Vitamin (MULTIVITAMIN WITH MINERALS) TABS tablet Take 1 tablet by mouth daily.    . Multiple Vitamins-Minerals (ICAPS PO) Take 1 capsule by mouth daily.    . psyllium (METAMUCIL) 58.6 % packet Take 1 packet by mouth daily. Reported on 06/07/2016    . simvastatin (ZOCOR) 20 MG tablet Take 1 tablet (20 mg total) by mouth at bedtime. 30 tablet 2   No facility-administered medications prior to visit.       Past Surgical History:  Procedure Laterality Date  . APPENDECTOMY    . CARDIOVERSION N/A 06/04/2016   Procedure: CARDIOVERSION;  Surgeon: Pixie Casino, MD;  [facility] ENDOSCOPY;  Service: Cardiovascular;  Laterality: N/A;  . CATARACT EXTRACTION Left   . CHOLECYSTECTOMY OPEN    . COLONOSCOPY W/ BIOPSIES AND POLYPECTOMY    .  INGUINAL HERNIA REPAIR Bilateral   . MYRINGOTOMY WITH TUBE PLACEMENT Bilateral   . TEE WITHOUT CARDIOVERSION N/A 06/04/2016   Procedure: TRANSESOPHAGEAL ECHOCARDIOGRAM (TEE);  Surgeon: Pixie Casino, MD;  Location: [facility] ENDOSCOPY;  Service: Cardiovascular;  Laterality: N/A;  . TONSILLECTOMY AND ADENOIDECTOMY        Past Medical History:  Diagnosis Date  . Anxiety   . Arthritis    "shoulders" (05/28/2016)  . Cardiomyopathy   . Coronary artery disease   . Depression    hx (05/28/2016)  . Hemorrhoids   . Hyperlipidemia   . Hypertension   .  Skipped heart beats       Review of Systems  Constitutional: Negative for chills and fever.  Respiratory: Negative for chest tightness and shortness of breath.   Cardiovascular: Negative for chest pain, palpitations and leg swelling.  Musculoskeletal: Positive for arthralgias.  Neurological: Negative for dizziness, weakness, numbness and headaches.      Objective:    BP (!) 144/84 (BP Location: Left Arm, Patient Position: Sitting, Cuff Size: Normal)   Pulse 80   Temp 97.4 F (36.3 C) (Oral)   Resp 14   Ht 5\' 8"  (1.727 m)   Wt 188 lb (85.3 kg)   SpO2 93%   BMI 28.59 kg/m  Nursing note and vital signs reviewed.  Physical Exam  Constitutional: He is oriented to person, place, and time. He appears well-developed and well-nourished. No distress.  Cardiovascular: Normal rate, regular rhythm, normal heart sounds and intact distal pulses.   Pulmonary/Chest: Effort normal and breath sounds normal.  Neurological: He is alert and oriented to person, place, and time.  Skin: Skin is warm and dry.  Occipital laceration appears well approximated with 2 staples and no evidence of infection.  Psychiatric: He has a normal mood and affect. His behavior is normal. Judgment and thought content normal.       Assessment & Plan:   Problem List Items Addressed This Visit      Cardiovascular and Mediastinum   Atrial bigeminy - Primary    Atrial bigeminy noted upon auscultation with normal heart rate and no evidence of dizziness or heart palpitations. He has restarted metoprolol per cardiology instructions. Continue current dosage of medications with changes per cardiology.        Other   Scalp laceration    Scalp laceration with good approximation. Staples removed without complication and tolerated well by the patient. Continue to monitor for signs of infection and keep clean using soap and water.Return precautions provided.        Other Visit Diagnoses   None.      I am having Mr.  Mole maintain his furosemide, psyllium, ALPRAZolam, linaclotide, ELIQUIS, finasteride, multivitamin with minerals, Multiple Vitamins-Minerals (ICAPS PO), simvastatin, and metoprolol succinate.   Follow-up: Return in about 3 months (around 12/04/2016), or if symptoms worsen or fail to improve.  Mauricio Po, FNP

## 2016-09-03 NOTE — Patient Instructions (Addendum)
Thank you for choosing [facility].  SUMMARY AND INSTRUCTIONS:  Please continue to take medications as prescribed.   Continue to work with your son for rehabilitation and moving around.   Follow up with Dr. Allyson Sabal for your head.   Medication:  Your prescription(s) have been submitted to your pharmacy or been printed and provided for you. Please take as directed and contact our office if you believe you are having problem(s) with the medication(s) or have any questions.  Follow up:  If your symptoms worsen or fail to improve, please contact our office for further instruction, or in case of emergency go directly to the emergency room at the closest medical facility.

## 2016-09-16 DIAGNOSIS — Z85828 Personal history of other malignant neoplasm of skin: Secondary | ICD-10-CM | POA: Diagnosis not present

## 2016-09-16 DIAGNOSIS — L570 Actinic keratosis: Secondary | ICD-10-CM | POA: Diagnosis not present

## 2016-09-17 ENCOUNTER — Other Ambulatory Visit: Payer: Self-pay | Admitting: *Deleted

## 2016-09-17 MED ORDER — FUROSEMIDE 20 MG PO TABS: 20.0000 mg | ORAL_TABLET | Freq: Every day | ORAL | 3 refills | Status: DC

## 2016-10-04 ENCOUNTER — Encounter: Payer: Self-pay | Admitting: Physician Assistant

## 2016-10-04 ENCOUNTER — Ambulatory Visit (INDEPENDENT_AMBULATORY_CARE_PROVIDER_SITE_OTHER): Payer: Medicare Other | Admitting: Physician Assistant

## 2016-10-04 VITALS — BP 122/62 | HR 60 | Ht 67.0 in | Wt 192.0 lb

## 2016-10-04 DIAGNOSIS — I10 Essential (primary) hypertension: Secondary | ICD-10-CM | POA: Diagnosis not present

## 2016-10-04 DIAGNOSIS — N183 Chronic kidney disease, stage 3 unspecified: Secondary | ICD-10-CM

## 2016-10-04 DIAGNOSIS — R001 Bradycardia, unspecified: Secondary | ICD-10-CM

## 2016-10-04 DIAGNOSIS — I480 Paroxysmal atrial fibrillation: Secondary | ICD-10-CM

## 2016-10-04 DIAGNOSIS — I5022 Chronic systolic (congestive) heart failure: Secondary | ICD-10-CM

## 2016-10-04 DIAGNOSIS — I25810 Atherosclerosis of coronary artery bypass graft(s) without angina pectoris: Secondary | ICD-10-CM

## 2016-10-04 NOTE — Progress Notes (Signed)
Cardiology Office Note    Date:  10/04/2016   ID:  [name] [name], DOB 1920-10-21, MRN [id number]  PCP:  Walker Kehr, MD  Cardiologist:  Dr. Percival Spanish  Chief Complaint  Patient presents with  . Follow-up    patient reports hemorroids, wants to cut down on eliquis.    History of Present Illness:  [name] [name] is a 82 y.o. male with PMH of HTN, CKD stage III, BPH, CAD, chronic bradycardia, PAF on eliquis and chronic systolic HF with baseline EF 20%. 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 Unfortunately he lost both of his wife and their daughter within a period of a week several years ago. His son who is a retired Community education officer still lives in [place]. He lives by himself with his girlfriend sometimes coming over. He previously presented to the hospital on 06/01/2016 with a syncopal episode. He was found to be in atrial fibrillation/atrial flutter on EKG. CT of the head showed no intracranial abnormality. He was started on eliquis and a diltiazem drip. Echocardiogram obtained on 05/29/2016 showed EF 20-25%, diffuse hypokinesis, mild AI, moderately reduced RVEF. Diltiazem drip was initially started for rate control progress, later switched to Toprol-XL 75 mg given his LV dysfunction. He underwent TEE DCCV on 06/04/2016 with successful conversion to sinus bradycardia after a single 150 J biphasic shock. He was started on PO amiodarone. 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 Since discharge, he has went back to the emergency room again on 08/23/2016 after presenting with a mechanical fall. He states he caught his feet tangled and fell hitting his head on a brick. He has a resultant scalp laceration. Heart rate in the emergency room was in the 30s, sinus bradycardia with PACs, RBBB, first degree AV block and LAFB. It turned out that although patient was supposed to be taking 200 mg daily of amiodarone, and in fact he was taking 400 mg daily instead. His amiodarone was  stopped, beta blocker was also stopped with recommendation of restarting beta blocker if heart rate is stable.  I last saw the patient on 09/02/2016, at that time I restarted him on 25 mg of Toprol-XL. He presents today for follow-up, he has no significant dizziness, chest discomfort or shortness of breath. He continues to ambulate as much as he can. He says in the last several months, he did have 2 episodes of bleeding, he forgot he had the first episode, however he thinks his second episode of bleeding is related to his hemorrhoid. This occurred several weeks ago, he did observe a lot of blood in his underware, however this was stopped and has not had any further issues since. He also complained of a lot of vivid dreams, most of which are bad ones, this has been occurring in the last half a year also. I did instructed him to discuss with his PCP, I did not see any potential medications that can cause his dream.    Past Medical History:  Diagnosis Date  . Anxiety   . Arthritis    "shoulders" (05/28/2016)  . Cardiomyopathy   . Coronary artery disease   . Depression    hx (05/28/2016)  . Hemorrhoids   . Hyperlipidemia   . Hypertension   . Skipped heart beats     Past Surgical History:  Procedure Laterality Date  . APPENDECTOMY    . CARDIOVERSION N/A 06/04/2016   Procedure: CARDIOVERSION;  Surgeon: Pixie Casino, MD;  Location[facility] ENDOSCOPY;  Service: Cardiovascular;  Laterality: N/A;  . CATARACT EXTRACTION Left   . CHOLECYSTECTOMY OPEN    . COLONOSCOPY W/ BIOPSIES AND POLYPECTOMY    . INGUINAL HERNIA REPAIR Bilateral   . MYRINGOTOMY WITH TUBE PLACEMENT Bilateral   . TEE WITHOUT CARDIOVERSION N/A 06/04/2016   Procedure: TRANSESOPHAGEAL ECHOCARDIOGRAM (TEE);  Surgeon: Pixie Casino, MD;  Location: [facility] ENDOSCOPY;  Service: Cardiovascular;  Laterality: N/A;  . TONSILLECTOMY AND ADENOIDECTOMY      Current Medications: Outpatient Medications Prior to Visit  Medication Sig Dispense Refill   . ALPRAZolam (XANAX) 1 MG tablet Take 1 tablet (1 mg total) by mouth at bedtime. 30 tablet 0  . ELIQUIS 2.5 MG TABS tablet TAKE 1 TABLET TWICE DAILY. 60 tablet 11  . finasteride (PROSCAR) 5 MG tablet Take 1 tablet (5 mg total) by mouth daily. 90 tablet 3  . furosemide (LASIX) 20 MG tablet Take 1 tablet (20 mg total) by mouth daily. 90 tablet 3  . linaclotide (LINZESS) 145 MCG CAPS capsule Take 1 capsule (145 mcg total) by mouth daily as needed. 16 capsule 0  . metoprolol succinate (TOPROL XL) 25 MG 24 hr tablet Take 1 tablet (25 mg total) by mouth daily. 30 tablet 2  . Multiple Vitamin (MULTIVITAMIN WITH MINERALS) TABS tablet Take 1 tablet by mouth daily.    . Multiple Vitamins-Minerals (ICAPS PO) Take 1 capsule by mouth daily.    . psyllium (METAMUCIL) 58.6 % packet Take 1 packet by mouth daily. Reported on 06/07/2016    . simvastatin (ZOCOR) 20 MG tablet Take 1 tablet (20 mg total) by mouth at bedtime. 30 tablet 2   No facility-administered medications prior to visit.      Allergies:   Lisinopril   Social History   Social History  . Marital status: Widowed    Spouse name: N/A  . Number of children: N/A  . Years of education: N/A   Social History Main Topics  . Smoking status: Never Smoker  . Smokeless tobacco: Never Used  . Alcohol use No  . Drug use: No  . Sexual activity: Yes   Other Topics Concern  . None   Social History Narrative  . None     Family History:  The patient's family history includes Heart disease in his brother.   ROS:   Please see the history of present illness.    ROS All other systems reviewed and are negative.   PHYSICAL EXAM:   VS:  BP 122/62   Pulse 60   Ht 5\' 7"  (1.702 m)   Wt 192 lb (87.1 kg)   BMI 30.07 kg/m    GEN: Well nourished, well developed, in no acute distress  HEENT: normal  Neck: no JVD, carotid bruits, or masses Cardiac: RRR; no murmurs, rubs, or gallops,no edema  Respiratory:  clear to auscultation bilaterally, normal  work of breathing GI: soft, nontender, nondistended, + BS MS: no deformity or atrophy  Skin: warm and dry, no rash Neuro:  Alert and Oriented x 3, Strength and sensation are intact Psych: euthymic mood, full affect  Wt Readings from Last 3 Encounters:  10/04/16 192 lb (87.1 kg)  09/03/16 188 lb (85.3 kg)  09/02/16 189 lb 3.2 oz (85.8 kg)      Studies/Labs Reviewed:   EKG:  EKG is ordered today.  The ekg ordered today demonstrates Normal sinus rhythm with heart rate 60s occasional PAC  Recent Labs: 12/14/2015: ALT 10 05/31/2016: B Natriuretic Peptide 629.5 08/22/2016: BUN 32; Creatinine,  Ser 1.47; Magnesium 2.1; Potassium 4.7; Sodium 138 08/23/2016: Hemoglobin 13.8; Platelets 122   Lipid Panel    Component Value Date/Time   CHOL 110 05/25/2013 1020   TRIG 83.0 05/25/2013 1020   HDL 39.00 (L) 05/25/2013 1020   CHOLHDL 3 05/25/2013 1020   VLDL 16.6 05/25/2013 1020   LDLCALC 54 05/25/2013 1020    Additional studies/ records that were reviewed today include:   Echo 05/29/2016 LV EF: 20% - 25%  ------------------------------------------------------------------- Indications: Syncope 780.2.  ------------------------------------------------------------------- History: PMH: Coronary artery disease. Cardiomyopathy of unknown etiology. Risk factors: Hypertension.  ------------------------------------------------------------------- Study Conclusions  - Left ventricle: The cavity size was normal. There was mild concentric hypertrophy. Systolic function was severely reduced. The estimated ejection fraction was in the range of 20% to 25%. Diffuse hypokinesis. - Aortic valve: There was mild regurgitation. - Aortic root: The aortic root was normal in size. - Mitral valve: Structurally normal valve. There was mild regurgitation. - Left atrium: The atrium was moderately dilated. - Right ventricle: Systolic function was moderately reduced. - Right atrium:  The atrium was normal in size. - Tricuspid valve: There was mild regurgitation. - Pulmonic valve: Structurally normal valve. There was no regurgitation. - Pulmonary arteries: Systolic pressure was within the normal range. - Inferior vena cava: The vessel was normal in size. - Pericardium, extracardiac: There was no pericardial effusion.  Impressions:  - There is severe LV systolic impairement and moderate RV systolic impairement. The EF evaluation might be underestimated by atrial fibrillation with RVR during the acquisition.   TEE DCCV 06/04/2016 Impression:  1. No LAA thrombus - chickenwing appendage. 2. Negative for PFO 3. Mild MR, AI, TR and mild to moderate PI 4. Aortic valve sclerosis 5. Severe global hypokinesis - LVEF 20-25% 6. Successful DCCV with a single 150J biphasic shock to sinus bradycardia  Recommendations:  1. Discontinue IV amiodarone. Start po amiodarone 400 mg daily tomorrow. 2. Hold Toprol XL today and restart tomorrow at reduced dose of 50 mg daily if HR >50. 3. Continue Eliquis 2.5 mg BID (he is due for a 10:00 am dose today) 4. Right forearm IV discontinued - there appears to be swelling, ecchymosis and edema of that arm. Elevated and warm compresses are recommended. 5. May need short term rehabilitation stay - son says no one is at home with the patient to assist him.   CT head wo contrast 08/22/2016 IMPRESSION: 1. No evidence for acute intracranial abnormality. 2. Atrophy and small vessel disease. 3. Focal laceration of the scalp in the occipital region. 4. Degenerative changes in the cervical spine. 5. No evidence for acute cervical spine abnormality.    ASSESSMENT:    1. Paroxysmal atrial fibrillation (HCC)   2. Bradycardia   3. Essential hypertension   4. CKD (chronic kidney disease), stage III   5. Chronic systolic heart failure ([place])   6. Coronary artery disease involving coronary bypass graft of native heart  without angina pectoris      PLAN:  In order of problems listed above:  1. Bradycardia. Heart rate has significantly improved, previously on 75 mg Toprol-XL daily and amiodarone 400 mg daily. Both were stopped upon ED visit when his heart rate was in the 30s. I have restart the Toprol-XL at 25 mg daily, he was able to tolerate without any issue. Will continue on this current medication.  2. Paroxysmal atrial fibrillation             - Admitted in July 2017, underwent TEE DCCV  on 06/04/2016 with successful conversion to sinus bradycardia after a single 150 J biphasic shock             - This patients CHA2DS2-VASc Score and unadjusted Ischemic Stroke Rate (% per year) is equal to 4.8 % stroke rate/year from a score of 4  Above score calculated as 1 point each if present [CHF, HTN, DM, Vascular=MI/PAD/Aortic Plaque, Age if 65-74, or Male] Above score calculated as 2 points each if present [Age > 75, or Stroke/TIA/TE]             - he is currently maintaining normal sinus rhythm. Given his age, we will need to reassess whether or not he should continue on eliquis. I think it is not unreasonable to stop his eliquis and the change to low dose aspirin given his comorbidities as long as the patient understand the potential increased risk of stroke off eliquis. However for defer this decision to the primary cardiologist.  3. Presumed CAD?: I cannot locate any record of previous cardiac catheterization. According to previous cardiology note, it appears he had a abnormal stress test years ago that was managed medically.  4. Chronic systolic heart failure: No sign of volume overload, given significant LV dysfunction noted on echocardiogram 05/29/2016, Toprol-XL has been restarted. We'll consider adding losartan or Entresto on follow-up. Consider order repeat echocardiogram on the next office visit.  5. Hypertension: Blood pressure well-controlled.  6. CKD III: Renal function stable on recent  lab    Medication Adjustments/Labs and Tests Ordered: Current medicines are reviewed at length with the patient today.  Concerns regarding medicines are outlined above.  Medication changes, Labs and Tests ordered today are listed in the Patient Instructions below. Patient Instructions  Your physician recommends that you continue on your current medications as directed. Please refer to the Current Medication list given to you today.   Your physician recommends that you schedule a follow-up appointment in:  [place], [place], [place]  10/04/2016 12:48 PM    [address], [place], [place]  [address] Phone: [phone]; Fax: [phone]

## 2016-10-04 NOTE — Patient Instructions (Signed)
Your physician recommends that you continue on your current medications as directed. Please refer to the Current Medication list given to you today.  Your physician recommends that you schedule a follow-up appointment in:  3 MONTHS WITH DR HOCHREIN 

## 2016-10-07 DIAGNOSIS — C44319 Basal cell carcinoma of skin of other parts of face: Secondary | ICD-10-CM | POA: Diagnosis not present

## 2016-10-07 DIAGNOSIS — D485 Neoplasm of uncertain behavior of skin: Secondary | ICD-10-CM | POA: Diagnosis not present

## 2016-10-07 DIAGNOSIS — L905 Scar conditions and fibrosis of skin: Secondary | ICD-10-CM | POA: Diagnosis not present

## 2016-10-22 ENCOUNTER — Ambulatory Visit (INDEPENDENT_AMBULATORY_CARE_PROVIDER_SITE_OTHER): Payer: Medicare Other | Admitting: Internal Medicine

## 2016-10-22 ENCOUNTER — Encounter: Payer: Self-pay | Admitting: Internal Medicine

## 2016-10-22 DIAGNOSIS — I5022 Chronic systolic (congestive) heart failure: Secondary | ICD-10-CM

## 2016-10-22 DIAGNOSIS — L570 Actinic keratosis: Secondary | ICD-10-CM

## 2016-10-22 DIAGNOSIS — I484 Atypical atrial flutter: Secondary | ICD-10-CM

## 2016-10-22 DIAGNOSIS — I420 Dilated cardiomyopathy: Secondary | ICD-10-CM | POA: Diagnosis not present

## 2016-10-22 DIAGNOSIS — I10 Essential (primary) hypertension: Secondary | ICD-10-CM | POA: Diagnosis not present

## 2016-10-22 MED ORDER — TAMSULOSIN HCL 0.4 MG PO CAPS: 0.4000 mg | ORAL_CAPSULE | Freq: Every day | ORAL | 3 refills | Status: DC

## 2016-10-22 NOTE — Assessment & Plan Note (Signed)
Furosemide.

## 2016-10-22 NOTE — Patient Instructions (Signed)
   Postprocedure instructions :     Keep the wounds clean. You can wash them with liquid soap and water. Pat dry with gauze or a Kleenex tissue  Before applying antibiotic ointment and a Band-Aid.   You need to report immediately  if  any signs of infection develop.    

## 2016-10-22 NOTE — Progress Notes (Signed)
Subjective:  Patient ID: [name] [name], male    DOB: September 11, 1920  Age: 82 y.o. MRN: [id number]  CC: No chief complaint on file.   HPI Taz Waggle presents for CAD, BPH, dyslipidemia f/u  Outpatient Medications Prior to Visit  Medication Sig Dispense Refill  . ALPRAZolam (XANAX) 1 MG tablet Take 1 tablet (1 mg total) by mouth at bedtime. 30 tablet 0  . ELIQUIS 2.5 MG TABS tablet TAKE 1 TABLET TWICE DAILY. 60 tablet 11  . finasteride (PROSCAR) 5 MG tablet Take 1 tablet (5 mg total) by mouth daily. 90 tablet 3  . furosemide (LASIX) 20 MG tablet Take 1 tablet (20 mg total) by mouth daily. 90 tablet 3  . linaclotide (LINZESS) 145 MCG CAPS capsule Take 1 capsule (145 mcg total) by mouth daily as needed. 16 capsule 0  . metoprolol succinate (TOPROL XL) 25 MG 24 hr tablet Take 1 tablet (25 mg total) by mouth daily. 30 tablet 2  . Multiple Vitamin (MULTIVITAMIN WITH MINERALS) TABS tablet Take 1 tablet by mouth daily.    . Multiple Vitamins-Minerals (ICAPS PO) Take 1 capsule by mouth daily.    . psyllium (METAMUCIL) 58.6 % packet Take 1 packet by mouth daily. Reported on 06/07/2016    . ranitidine (ZANTAC) 150 MG tablet Take 1 tablet by mouth 2 (two) times daily.  1  . simvastatin (ZOCOR) 20 MG tablet Take 1 tablet (20 mg total) by mouth at bedtime. 30 tablet 2   No facility-administered medications prior to visit.     ROS Review of Systems  Constitutional: Positive for fatigue. Negative for appetite change and unexpected weight change.  HENT: Negative for congestion, nosebleeds, sneezing, sore throat and trouble swallowing.   Eyes: Negative for itching and visual disturbance.  Respiratory: Negative for cough.   Cardiovascular: Negative for chest pain, palpitations and leg swelling.  Gastrointestinal: Negative for abdominal distention, blood in stool, diarrhea and nausea.  Genitourinary: Negative for frequency and hematuria.  Musculoskeletal: Positive for arthralgias and gait problem. Negative  for back pain, joint swelling and neck pain.  Skin: Negative for rash.  Neurological: Negative for dizziness, tremors, speech difficulty and weakness.  Psychiatric/Behavioral: Negative for agitation, dysphoric mood and sleep disturbance. The patient is not nervous/anxious.     Objective:  BP (!) 152/60   Pulse (!) 58   Temp 97.6 F (36.4 C)   Wt 195 lb (88.5 kg)   SpO2 99%   BMI 30.54 kg/m   BP Readings from Last 3 Encounters:  10/22/16 (!) 152/60  10/04/16 122/62  09/03/16 (!) 144/84    Wt Readings from Last 3 Encounters:  10/22/16 195 lb (88.5 kg)  10/04/16 192 lb (87.1 kg)  09/03/16 188 lb (85.3 kg)    Physical Exam  Constitutional: He is oriented to person, place, and time. He appears well-developed. No distress.  NAD  HENT:  Mouth/Throat: Oropharynx is clear and moist.  Eyes: Conjunctivae are normal. Pupils are equal, round, and reactive to light.  Neck: Normal range of motion. No JVD present. No thyromegaly present.  Cardiovascular: Normal rate and regular rhythm.  Exam reveals no gallop and no friction rub.   No murmur heard. Pulmonary/Chest: Effort normal and breath sounds normal. No respiratory distress. He has no wheezes. He has no rales. He exhibits no tenderness.  Abdominal: Soft. Bowel sounds are normal. He exhibits no distension and no mass. There is no tenderness. There is no rebound and no guarding.  Musculoskeletal: Normal range of motion. He  exhibits tenderness. He exhibits no edema.  Lymphadenopathy:    He has no cervical adenopathy.  Neurological: He is alert and oriented to person, place, and time. He has normal reflexes. No cranial nerve deficit. He exhibits normal muscle tone. He displays a negative Romberg sign. Coordination and gait normal.  Skin: Skin is warm and dry. No rash noted.  Psychiatric: He has a normal mood and affect. His behavior is normal. Judgment and thought content normal.  R shoulder w/restricted ROM irreg irreg AK scalp  x4  Lab Results  Component Value Date   WBC 6.3 08/23/2016   HGB 13.8 08/23/2016   HCT 42.5 08/23/2016   PLT 122 (L) 08/23/2016   GLUCOSE 109 (H) 08/22/2016   CHOL 110 05/25/2013   TRIG 83.0 05/25/2013   HDL 39.00 (L) 05/25/2013   LDLCALC 54 05/25/2013   ALT 10 12/14/2015   AST 17 12/14/2015   NA 138 08/22/2016   K 4.7 08/22/2016   CL 105 08/22/2016   CREATININE 1.47 (H) 08/22/2016   BUN 32 (H) 08/22/2016   CO2 26 08/22/2016   TSH 2.42 05/23/2015   PSA 0.24 03/08/2014    Ct Head Wo Contrast  Result Date: 08/22/2016 CLINICAL DATA:  Posterior head laceration after a fall this afternoon. Hit head on a brick. Patient is on blood thinners. EXAM: CT HEAD WITHOUT CONTRAST CT CERVICAL SPINE WITHOUT CONTRAST TECHNIQUE: Multidetector CT imaging of the head and cervical spine was performed following the standard protocol without intravenous contrast. Multiplanar CT image reconstructions of the cervical spine were also generated. COMPARISON:  05/28/2016 FINDINGS: CT HEAD FINDINGS Brain: There is moderate central and cortical atrophy. Periventricular white matter changes are consistent with small vessel disease. There is no intra or extra-axial fluid collection or mass lesion. The basilar cisterns and ventricles have a normal appearance. There is no CT evidence for acute infarction or hemorrhage. Vascular: No hyperdense vessel or unexpected calcification. Skull: Normal. Negative for fracture or focal lesion. Sinuses/Orbits: No acute finding. Other: Focal scalp laceration identified in the occipital region, not associated with fracture. CT CERVICAL SPINE FINDINGS Alignment: There is loss of cervical lordosis. This may be secondary to splinting, soft tissue injury, or positioning. Skull base and vertebrae: No acute fracture. No primary bone lesion or focal pathologic process. Soft tissues and spinal canal: No prevertebral fluid or swelling. No visible canal hematoma. Disc levels: Degenerative changes are  identified primarily at C4-5 and C6- C7. 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 Upper chest: Unremarkable. Other: Common carotid calcifications noted. IMPRESSION: 1.  No evidence for acute intracranial abnormality. 2. Atrophy and small vessel disease. 3. Focal laceration of the scalp in the occipital region. 4. Degenerative changes in the cervical spine. 5.  No evidence for acute cervical spine abnormality. Electronically Signed   By: Nolon Nations M.D.   On: 08/22/2016 15:30   Ct Cervical Spine Wo Contrast  Result Date: 08/22/2016 CLINICAL DATA:  Posterior head laceration after a fall this afternoon. Hit head on a brick. Patient is on blood thinners. EXAM: CT HEAD WITHOUT CONTRAST CT CERVICAL SPINE WITHOUT CONTRAST TECHNIQUE: Multidetector CT imaging of the head and cervical spine was performed following the standard protocol without intravenous contrast. Multiplanar CT image reconstructions of the cervical spine were also generated. COMPARISON:  05/28/2016 FINDINGS: CT HEAD FINDINGS Brain: There is moderate central and cortical atrophy. Periventricular white matter changes are consistent with small vessel disease. There is no intra or extra-axial  fluid collection or mass lesion. The basilar cisterns and ventricles have a normal appearance. There is no CT evidence for acute infarction or hemorrhage. Vascular: No hyperdense vessel or unexpected calcification. Skull: Normal. Negative for fracture or focal lesion. Sinuses/Orbits: No acute finding. Other: Focal scalp laceration identified in the occipital region, not associated with fracture. CT CERVICAL SPINE FINDINGS Alignment: There is loss of cervical lordosis. This may be secondary to splinting, soft tissue injury, or positioning. Skull base and vertebrae: No acute fracture. No primary bone lesion or focal pathologic process. Soft tissues and spinal canal: No prevertebral fluid or swelling. No visible canal  hematoma. Disc levels: Degenerative changes are identified primarily at C4-5 and C6- C7. 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 Upper chest: Unremarkable. Other: Common carotid calcifications noted. IMPRESSION: 1.  No evidence for acute intracranial abnormality. 2. Atrophy and small vessel disease. 3. Focal laceration of the scalp in the occipital region. 4. Degenerative changes in the cervical spine. 5.  No evidence for acute cervical spine abnormality. Electronically Signed   By: Nolon Nations M.D.   On: 08/22/2016 15:30    Procedure Note :     Procedure : Cryosurgery   Indication:   Actinic keratosis(es)   Risks including unsuccessful procedure , bleeding, infection, bruising, scar, a need for a repeat  procedure and others were explained to the patient in detail as well as the benefits. Informed consent was obtained verbally.    4 lesion(s)  on scalp   was/were treated with liquid nitrogen on a Q-tip in a usual fasion . Band-Aid was applied and antibiotic ointment was given for a later use.   Tolerated well. Complications none.   Postprocedure instructions :     Keep the wounds clean. You can wash them with liquid soap and water. Pat dry with gauze or a Kleenex tissue  Before applying antibiotic ointment and a Band-Aid.   You need to report immediately  if  any signs of infection develop.    Assessment & Plan:   There are no diagnoses linked to this encounter. I am having Mr. Andres maintain his psyllium, ALPRAZolam, linaclotide, ELIQUIS, finasteride, multivitamin with minerals, Multiple Vitamins-Minerals (ICAPS PO), simvastatin, metoprolol succinate, furosemide, and ranitidine.  No orders of the defined types were placed in this encounter.    Follow-up: No Follow-up on file.  Walker Kehr, MD

## 2016-10-22 NOTE — Assessment & Plan Note (Signed)
Scalp See procedure

## 2016-10-22 NOTE — Assessment & Plan Note (Signed)
Eliquis 

## 2016-10-22 NOTE — Assessment & Plan Note (Signed)
Amlodipine, Furosemide

## 2016-10-22 NOTE — Assessment & Plan Note (Signed)
Toprol, Furosemide 

## 2016-10-22 NOTE — Progress Notes (Signed)
Pre visit review using our clinic review tool, if applicable. No additional management support is needed unless otherwise documented below in the visit note. 

## 2016-11-04 ENCOUNTER — Other Ambulatory Visit: Payer: Self-pay | Admitting: Cardiology

## 2016-11-06 ENCOUNTER — Other Ambulatory Visit: Payer: Self-pay | Admitting: Cardiology

## 2016-11-08 NOTE — Telephone Encounter (Signed)
Alprazolam 1 mg called into pt [place] #30 3 refills

## 2016-11-08 NOTE — Telephone Encounter (Signed)
-----   Message from Minus Breeding, MD sent at 11/07/2016  9:08 PM EST ----- Patient is requesting a prescription for Xanax.  I am happy to fill this.  Please send it in.  The prescription is coming to my inbox but I cannot fill it on Epic.

## 2016-11-21 ENCOUNTER — Other Ambulatory Visit: Payer: Self-pay | Admitting: Internal Medicine

## 2016-11-25 ENCOUNTER — Other Ambulatory Visit: Payer: Self-pay | Admitting: Internal Medicine

## 2016-11-28 NOTE — Telephone Encounter (Signed)
Called refill into [place] spoke w/Lorie gave md approval.../lmb

## 2016-11-29 DIAGNOSIS — C44311 Basal cell carcinoma of skin of nose: Secondary | ICD-10-CM | POA: Diagnosis not present

## 2016-12-11 ENCOUNTER — Encounter (HOSPITAL_COMMUNITY): Payer: Self-pay | Admitting: *Deleted

## 2016-12-11 ENCOUNTER — Emergency Department (HOSPITAL_COMMUNITY)
Admission: EM | Admit: 2016-12-11 | Discharge: 2016-12-11 | Disposition: A | Discharge status: Home / Self Care | Payer: Medicare Other | Attending: Emergency Medicine | Admitting: Emergency Medicine

## 2016-12-11 ENCOUNTER — Emergency Department (HOSPITAL_COMMUNITY): Payer: Medicare Other

## 2016-12-11 DIAGNOSIS — I130 Hypertensive heart and chronic kidney disease with heart failure and stage 1 through stage 4 chronic kidney disease, or unspecified chronic kidney disease: Secondary | ICD-10-CM | POA: Insufficient documentation

## 2016-12-11 DIAGNOSIS — Z85828 Personal history of other malignant neoplasm of skin: Secondary | ICD-10-CM | POA: Insufficient documentation

## 2016-12-11 DIAGNOSIS — I5022 Chronic systolic (congestive) heart failure: Secondary | ICD-10-CM | POA: Insufficient documentation

## 2016-12-11 DIAGNOSIS — I2510 Atherosclerotic heart disease of native coronary artery without angina pectoris: Secondary | ICD-10-CM | POA: Insufficient documentation

## 2016-12-11 DIAGNOSIS — M25562 Pain in left knee: Secondary | ICD-10-CM | POA: Diagnosis not present

## 2016-12-11 DIAGNOSIS — Z4802 Encounter for removal of sutures: Secondary | ICD-10-CM | POA: Diagnosis not present

## 2016-12-11 DIAGNOSIS — Z7901 Long term (current) use of anticoagulants: Secondary | ICD-10-CM | POA: Diagnosis not present

## 2016-12-11 DIAGNOSIS — N183 Chronic kidney disease, stage 3 (moderate): Secondary | ICD-10-CM | POA: Diagnosis not present

## 2016-12-11 DIAGNOSIS — M25461 Effusion, right knee: Secondary | ICD-10-CM | POA: Diagnosis not present

## 2016-12-11 DIAGNOSIS — M25462 Effusion, left knee: Secondary | ICD-10-CM | POA: Diagnosis not present

## 2016-12-11 LAB — CBC WITH DIFFERENTIAL/PLATELET
Basophils Absolute: 0 10*3/uL (ref 0.0–0.1)
Basophils Relative: 0 %
Eosinophils Absolute: 0 10*3/uL (ref 0.0–0.7)
Eosinophils Relative: 0 %
HCT: 41.7 % (ref 39.0–52.0)
Hemoglobin: 14 g/dL (ref 13.0–17.0)
Lymphocytes Relative: 11 %
Lymphs Abs: 1 10*3/uL (ref 0.7–4.0)
MCH: 30.9 pg (ref 26.0–34.0)
MCHC: 33.6 g/dL (ref 30.0–36.0)
MCV: 92.1 fL (ref 78.0–100.0)
Monocytes Absolute: 0.8 10*3/uL (ref 0.1–1.0)
Monocytes Relative: 9 %
Neutro Abs: 7 10*3/uL (ref 1.7–7.7)
Neutrophils Relative %: 80 %
Platelets: 130 10*3/uL — ABNORMAL LOW (ref 150–400)
RBC: 4.53 MIL/uL (ref 4.22–5.81)
RDW: 13.6 % (ref 11.5–15.5)
WBC: 8.8 10*3/uL (ref 4.0–10.5)

## 2016-12-11 LAB — URINALYSIS, ROUTINE W REFLEX MICROSCOPIC
Bilirubin Urine: NEGATIVE
Glucose, UA: NEGATIVE mg/dL
Hgb urine dipstick: NEGATIVE
Ketones, ur: NEGATIVE mg/dL
Leukocytes, UA: NEGATIVE
Nitrite: NEGATIVE
Protein, ur: NEGATIVE mg/dL
Specific Gravity, Urine: 1.016 (ref 1.005–1.030)
pH: 7 (ref 5.0–8.0)

## 2016-12-11 LAB — BASIC METABOLIC PANEL
Anion gap: 7 (ref 5–15)
BUN: 26 mg/dL — ABNORMAL HIGH (ref 6–20)
CO2: 29 mmol/L (ref 22–32)
Calcium: 8.3 mg/dL — ABNORMAL LOW (ref 8.9–10.3)
Chloride: 102 mmol/L (ref 101–111)
Creatinine, Ser: 1.23 mg/dL (ref 0.61–1.24)
GFR calc Af Amer: 56 mL/min — ABNORMAL LOW (ref >60)
GFR calc non Af Amer: 48 mL/min — ABNORMAL LOW (ref >60)
Glucose, Bld: 128 mg/dL — ABNORMAL HIGH (ref 65–99)
Potassium: 3.9 mmol/L (ref 3.5–5.1)
Sodium: 138 mmol/L (ref 135–145)

## 2016-12-11 MED ORDER — TRAMADOL HCL 50 MG PO TABS: 50.0000 mg | ORAL_TABLET | Freq: Four times a day (QID) | ORAL | 0 refills | Status: DC | PRN

## 2016-12-11 MED ORDER — TRAMADOL HCL 50 MG PO TABS
50.0000 mg | ORAL_TABLET | Freq: Once | ORAL | Status: AC
  Administered 2016-12-11: 50 mg via ORAL
  Filled 2016-12-11: qty 1

## 2016-12-11 MED ORDER — BUPIVACAINE HCL 0.25 % IJ SOLN
10.0000 mL | Freq: Once | INTRAMUSCULAR | Status: AC
  Administered 2016-12-11: 10 mL
  Filled 2016-12-11: qty 10

## 2016-12-11 MED ORDER — IBUPROFEN 200 MG PO TABS
400.0000 mg | ORAL_TABLET | Freq: Once | ORAL | Status: AC
  Administered 2016-12-11: 400 mg via ORAL
  Filled 2016-12-11: qty 2

## 2016-12-11 NOTE — ED Notes (Signed)
Pt ambulated in hall with 2 person assist, was reluctant to put pressure on left leg but stated he was doing better walking than he was this morning.

## 2016-12-11 NOTE — ED Notes (Signed)
Bed: WA01 Expected date:  Expected time:  Means of arrival:  Comments: EMS- 90s M, knee pain/unable to ambulate

## 2016-12-11 NOTE — ED Provider Notes (Signed)
[facility] DEPT Provider Note   CSN: [id number] Arrival date & time: 12/11/16  1034 By signing my name below, I, Georgette Shell, attest that this documentation has been prepared under the direction and in the presence of Virgel Manifold, MD. Electronically Signed: Georgette Shell, ED Scribe. 12/11/16. 11:08 AM.  History   Chief Complaint Chief Complaint  Patient presents with  . Knee Pain    HPI The history is provided by the patient. No language interpreter was used.   HPI Comments: [name] [name] is a 83 y.o. male with h/o CAD, HTN, CKD, CHF, A-fib, and bradycardia, who presents to the Emergency Department complaining of acute on chronic, left knee pain worsening one day ago. He states he was walking yesterday when his knee "gave out" on him. Pt denies any recent injury or trauma. Pain is exacerbated with bearing weight. He has applied ice to the area and taken 2 Tylenols with moderate relief. Denies h/o gout. Pt is on Eliquis. Pt denies fever, chills, or any other associated symptoms.  Past Medical History:  Diagnosis Date  . Anxiety   . Arthritis    "shoulders" (05/28/2016)  . Cardiomyopathy   . Coronary artery disease   . Depression    hx (05/28/2016)  . Hemorrhoids   . Hyperlipidemia   . Hypertension   . Skipped heart beats     Patient Active Problem List   Diagnosis Date Noted  . Atrial bigeminy 08/22/2016  . Bradycardia 08/22/2016  . Knee pain, bilateral 07/17/2016  . Traumatic hematoma of right upper arm 06/07/2016  . Laceration of head 06/07/2016  . PAF (paroxysmal atrial fibrillation) ([place])   . Demand ischemia ([place]) 06/01/2016  . Systolic CHF, chronic ([place]) 06/01/2016  . Faintness   . Congestive dilated cardiomyopathy ([place])   . Abrasion, multiple sites   . Atypical atrial flutter ([place])   . Fall 05/28/2016  . Scalp laceration 05/28/2016  . Contusion of arm, right 05/28/2016  . Syncope and collapse 05/28/2016  . History of sinus bradycardia 05/28/2016  . Contusion of  right forearm 05/28/2016  . CKD (chronic kidney disease) stage 3, GFR 30-59 ml/min 05/28/2016  . Acute hyperkalemia 05/28/2016  . Thrombocytopenia (CHRONIC) 05/28/2016  . Contusion of head   . Elevated troponin   . Constipation 10/10/2015  . Angular stomatitis 11/21/2014  . Erectile dysfunction 11/21/2014  . Rash and nonspecific skin eruption 07/01/2014  . DOE (dyspnea on exertion) 03/08/2014  . Laceration of right hand 12/21/2013  . Traumatic hematoma of forehead 12/21/2013  . Fall at home 12/21/2013  . Eczema 11/02/2013  . Paresthesia 03/29/2013  . Pain in joint, shoulder region 12/28/2012  . Neoplasm of uncertain behavior of skin 11/28/2011  . Actinic keratoses 11/28/2011  . Osteoarthritis 11/28/2011  . Well adult exam 11/28/2011  . Hemorrhoids, internal, with bleeding s/p banding 11/14/2011  . HYPERKERATOSIS 12/26/2009  . Essential hypertension 06/02/2009  . Malignant neoplasm of skin of parts of face 04/12/2008  . INSOMNIA 04/12/2008  . Dyslipidemia 09/29/2007  . ANXIETY DEPRESSION 09/29/2007  . Coronary atherosclerosis 09/29/2007  . BPH (benign prostatic hyperplasia) 09/29/2007    Past Surgical History:  Procedure Laterality Date  . APPENDECTOMY    . CARDIOVERSION N/A 06/04/2016   Procedure: CARDIOVERSION;  Surgeon: Pixie Casino, MD;  Location: [facility] ENDOSCOPY;  Service: Cardiovascular;  Laterality: N/A;  . CATARACT EXTRACTION Left   . CHOLECYSTECTOMY OPEN    . COLONOSCOPY W/ BIOPSIES AND POLYPECTOMY    . INGUINAL HERNIA REPAIR Bilateral   .  MYRINGOTOMY WITH TUBE PLACEMENT Bilateral   . TEE WITHOUT CARDIOVERSION N/A 06/04/2016   Procedure: TRANSESOPHAGEAL ECHOCARDIOGRAM (TEE);  Surgeon: Pixie Casino, MD;  Location: [facility] ENDOSCOPY;  Service: Cardiovascular;  Laterality: N/A;  . TONSILLECTOMY AND ADENOIDECTOMY         Home Medications    Prior to Admission medications   Medication Sig Start Date End Date Taking? Authorizing Provider  ALPRAZolam Duanne Moron) 1 MG  tablet TAKE ONE TABLET AT BEDTIME. 11/08/16   Minus Breeding, MD  ELIQUIS 2.5 MG TABS tablet TAKE 1 TABLET TWICE DAILY. 08/09/16   Aleksei Plotnikov V, MD  finasteride (PROSCAR) 5 MG tablet Take 1 tablet (5 mg total) by mouth daily. 08/20/16 08/20/17  Aleksei Plotnikov V, MD  furosemide (LASIX) 20 MG tablet Take 1 tablet (20 mg total) by mouth daily. 09/17/16   Aleksei Plotnikov V, MD  linaclotide (LINZESS) 145 MCG CAPS capsule Take 1 capsule (145 mcg total) by mouth daily as needed. 07/19/16   Aleksei Plotnikov V, MD  metoprolol succinate (TOPROL XL) 25 MG 24 hr tablet Take 1 tablet (25 mg total) by mouth daily. 09/02/16   Almyra Deforest, PA  Multiple Vitamin (MULTIVITAMIN WITH MINERALS) TABS tablet Take 1 tablet by mouth daily.    Historical Provider, MD  Multiple Vitamins-Minerals (ICAPS PO) Take 1 capsule by mouth daily.    Historical Provider, MD  promethazine-codeine (PHENERGAN WITH CODEINE) 6.25-10 MG/5ML syrup TAKE 1 TEASPOONFUL (5ML) EVERY 4 HOURS AS NEEDED FOR COUGH. 11/27/16   Aleksei Plotnikov V, MD  promethazine-codeine (PHENERGAN WITH CODEINE) 6.25-10 MG/5ML syrup TAKE 1 TEASPOONFUL (5ML) EVERY 4 HOURS AS NEEDED FOR COUGH. 11/27/16   Aleksei Plotnikov V, MD  psyllium (METAMUCIL) 58.6 % packet Take 1 packet by mouth daily. Reported on 06/07/2016    Historical Provider, MD  ranitidine (ZANTAC) 150 MG tablet Take 1 tablet by mouth 2 (two) times daily. 08/08/16   Historical Provider, MD  simvastatin (ZOCOR) 20 MG tablet Take 1 tablet (20 mg total) by mouth at bedtime. 09/02/16 12/01/16  Almyra Deforest, PA  tamsulosin (FLOMAX) 0.4 MG CAPS capsule Take 1 capsule (0.4 mg total) by mouth daily. 10/22/16   Cassandria Anger, MD    Family History Family History  Problem Relation Age of Onset  . Heart disease Brother     Social History Social History  Substance Use Topics  . Smoking status: Never Smoker  . Smokeless tobacco: Never Used  . Alcohol use No     Allergies   Lisinopril   Review of  Systems Review of Systems  Constitutional: Negative for chills and fever.  Musculoskeletal: Positive for arthralgias and joint swelling.  All other systems reviewed and are negative.  Physical Exam Updated Vital Signs BP 137/76 (BP Location: Left Arm)   Pulse 61   Temp 97.5 F (36.4 C) (Oral)   Resp 18   Ht 5\' 7"  (1.702 m)   Wt 193 lb (87.5 kg)   SpO2 97%   BMI 30.23 kg/m   Physical Exam  Constitutional: He is oriented to person, place, and time. Vital signs are normal. He appears well-developed and well-nourished.  Non-toxic appearance. He does not appear ill. No distress.  HENT:  Head: Normocephalic. Head is with abrasion.  Nose: Nose normal.  Mouth/Throat: Oropharynx is clear and moist. No oropharyngeal exudate.  Healing wound on the face just right of the nose.  Eyes: Conjunctivae and EOM are normal. Pupils are equal, round, and reactive to light. No scleral icterus.  Neck: Normal  range of motion. Neck supple. No tracheal deviation, no edema, no erythema and normal range of motion present. No thyroid mass and no thyromegaly present.  Cardiovascular: Normal rate, regular rhythm, S1 normal, S2 normal, normal heart sounds, intact distal pulses and normal pulses.  Exam reveals no gallop and no friction rub.   No murmur heard. Pulmonary/Chest: Effort normal and breath sounds normal. No respiratory distress. He has no wheezes. He has no rhonchi. He has no rales.  Abdominal: Soft. Normal appearance and bowel sounds are normal. He exhibits no distension, no ascites and no mass. There is no hepatosplenomegaly. There is no tenderness. There is no rebound, no guarding and no CVA tenderness.  Musculoskeletal: Normal range of motion. He exhibits tenderness. He exhibits no edema.  Right knee effusion, mild increase in warmth, no erythema. Can actively range, although limited secondary to pain. Neurovascularly intact.  Lymphadenopathy:    He has no cervical adenopathy.  Neurological: He is  alert and oriented to person, place, and time. He has normal strength. No cranial nerve deficit or sensory deficit.  Skin: Skin is warm, dry and intact. No petechiae and no rash noted. He is not diaphoretic. No erythema. No pallor.  Nursing note and vitals reviewed.    ED Treatments / Results  DIAGNOSTIC STUDIES: Oxygen Saturation is 97% on RA, adequate by my interpretation.   COORDINATION OF CARE: 11:07 AM Discussed treatment plan with pt at bedside which includes x-ray and pt agreed to plan.  Labs (all labs ordered are listed, but only abnormal results are displayed) Labs Reviewed  CBC WITH DIFFERENTIAL/PLATELET - Abnormal; Notable for the following:       Result Value   Platelets 130 (*)    All other components within normal limits  BASIC METABOLIC PANEL - Abnormal; Notable for the following:    Glucose, Bld 128 (*)    BUN 26 (*)    Calcium 8.3 (*)    GFR calc non Af Amer 48 (*)    GFR calc Af Amer 56 (*)    All other components within normal limits  BODY FLUID CULTURE  GRAM STAIN  URINALYSIS, ROUTINE W REFLEX MICROSCOPIC  GLUCOSE, SYNOVIAL FLUID  PROTEIN, SYNOVIAL FLUID  SYNOVIAL CELL COUNT + DIFF, W/ CRYSTALS    EKG  EKG Interpretation None       Radiology Dg Knee Complete 4 Views Left  Result Date: 12/11/2016 CLINICAL DATA:  Worsening left knee pain. EXAM: LEFT KNEE - COMPLETE 4+ VIEW COMPARISON:  None. FINDINGS: Small joint effusion. Joint space narrowing and spurring in the medial and patellofemoral compartments. No acute bony abnormality. Specifically, no fracture, subluxation, or dislocation. Soft tissues are intact. IMPRESSION: Mild degenerative changes with small joint effusion. No acute bony abnormality. Electronically Signed   By: Rolm Baptise M.D.   On: 12/11/2016 11:54    Procedures .Joint Aspiration/Arthrocentesis Date/Time: 12/11/2016 1:23 PM Performed by: Virgel Manifold Authorized by: Virgel Manifold   Consent:    Consent obtained:  Verbal    Consent given by:  Patient Location:    Location:  Knee   Knee:  L knee Anesthesia (see MAR for exact dosages):    Anesthesia method:  Local infiltration   Local anesthetic:  Bupivacaine 0.25% w/o epi Procedure details:    Preparation: Patient was prepped and draped in usual sterile fashion     Needle gauge:  18 G   Ultrasound guidance: no     Approach:  Medial   Aspirate amount:  1 cc  Aspirate characteristics:  Yellow   Steroid injected: no     Specimen collected: no   Post-procedure details:    Dressing:  Sterile dressing   Patient tolerance of procedure:  Tolerated well, no immediate complications .Suture Removal Date/Time: 12/11/2016 1:25 PM Performed by: Virgel Manifold Authorized by: Virgel Manifold   Consent:    Consent obtained:  Verbal   Consent given by:  Patient Location:    Location:  [place] location:  [place] location:  R cheek Procedure details:    Wound appearance:  No signs of infection   Number of sutures removed:  2 Post-procedure details:    Post-removal:  No dressing applied   Patient tolerance of procedure:  Tolerated well, no immediate complications   Medications Ordered in ED Medications - No data to display   Initial Impression / Assessment and Plan / ED Course  I have reviewed the triage vital signs and the nursing notes.  Pertinent labs & imaging results that were available during my care of the patient were reviewed by me and considered in my medical decision making (see chart for details).     83 year old male with atraumatic left knee pain. I did an arthrocentesis but only got a scant amount of fluid. He did feel better and was able to bear weight after injection of marcaine. PRN pain meds. I think judicious use of low dose NSAIDs is reasonable for a few days despite his advanced age and mild CKD. I doubt infectious etiology. Exam not consistent with DVT. Pt also requesting suture removal at site of skin excision on  face. He reports he was scheduled for suture removal tomorrow anyways. This wound appears to be healing well. I identified two running sutures and a single interrupted which were removed.   Final Clinical Impressions(s) / ED Diagnoses   Final diagnoses:  Acute pain of left knee  Suture Removal  New Prescriptions New Prescriptions   No medications on file   I personally preformed the services scribed in my presence. The recorded information has been reviewed is accurate. Virgel Manifold, MD.     Virgel Manifold, MD 12/11/16 [phone]

## 2016-12-11 NOTE — ED Notes (Signed)
Pt on Eliquis and EMS accidentally scraped his right arm, but it is bandaged up.

## 2016-12-11 NOTE — ED Triage Notes (Signed)
Pt coming from home with chronic Left knee pain that is worse today. Denies injury to knee.  Per EMS pt smells like UTI and pt sts he feels more confused today.

## 2016-12-20 ENCOUNTER — Other Ambulatory Visit: Payer: Self-pay | Admitting: Internal Medicine

## 2016-12-25 NOTE — Telephone Encounter (Signed)
Called refill into pharmacy spoke w/pharmacist brooke gave MD approval.../lmb

## 2017-01-02 NOTE — Progress Notes (Signed)
HPI  The patient presents for follow up after a recent hospitalization for syncope with head trauma.  He was found to have a new cardiomyopathy with an EF of  20% and newly diagnosed atrial fib.  He underwent TEE/DCCV and discharged on anticoagulation and amiodarone. However, he had bradycardia and the beta blocker and amiodarone were stopped.  At the last visit I restarted a low dose of beta blocker.  From a heart standpoint he thinks he's done well. He's not had any palpitations, presyncope or syncope. He denies any chest pressure, neck or arm discomfort. He's had some left leg swelling. I do see some emergency room records and I reviewed these from January 24. He had some knee pain and he went to did have a tap of his knee but there was not much fluid. He's continued to have left calf swelling. He is on anticoagulation but his dose might be low for his renal function. Initially 2.5 of a left was was chosen because his creatinine was greater than 1.5 down now. He's had no acute shortness of breath, PND or orthopnea. He sleeps chronically in a recliner.  Allergies  Allergen Reactions  . Lisinopril Cough    Prior to Admission medications   Medication Sig Start Date End Date Taking? Authorizing Provider  ALPRAZolam Duanne Moron) 1 MG tablet TAKE ONE TABLET AT BEDTIME. 11/08/16  Yes Minus Breeding, MD  ELIQUIS 2.5 MG TABS tablet TAKE 1 TABLET TWICE DAILY. 08/09/16  Yes Aleksei Plotnikov V, MD  finasteride (PROSCAR) 5 MG tablet Take 1 tablet (5 mg total) by mouth daily. 08/20/16 08/20/17 Yes Aleksei Plotnikov V, MD  furosemide (LASIX) 20 MG tablet Take 1 tablet (20 mg total) by mouth daily. 09/17/16  Yes Aleksei Plotnikov V, MD  linaclotide (LINZESS) 145 MCG CAPS capsule Take 1 capsule (145 mcg total) by mouth daily as needed. 07/19/16  Yes Aleksei Plotnikov V, MD  metoprolol succinate (TOPROL XL) 25 MG 24 hr tablet Take 1 tablet (25 mg total) by mouth daily. 09/02/16  Yes Almyra Deforest, PA  Multiple Vitamin  (MULTIVITAMIN WITH MINERALS) TABS tablet Take 1 tablet by mouth daily.   Yes Historical Provider, MD  Multiple Vitamins-Minerals (ICAPS PO) Take 1 capsule by mouth daily.   Yes Historical Provider, MD  promethazine-codeine (PHENERGAN WITH CODEINE) 6.25-10 MG/5ML syrup TAKE 1 TEASPOONFUL (5ML) EVERY 4 HOURS AS NEEDED FOR COUGH. 11/27/16  Yes Aleksei Plotnikov V, MD  psyllium (METAMUCIL) 58.6 % packet Take 1 packet by mouth at bedtime. Reported on 06/07/2016   Yes Historical Provider, MD  ranitidine (ZANTAC) 150 MG tablet Take 1 tablet by mouth 2 (two) times daily as needed for heartburn.  08/08/16  Yes Historical Provider, MD  simvastatin (ZOCOR) 20 MG tablet Take 1 tablet (20 mg total) by mouth at bedtime. Patient not taking: Reported on 12/11/2016 09/02/16 12/11/16  Almyra Deforest, PA     Past Medical History:  Diagnosis Date  . Anxiety   . Arthritis    "shoulders" (05/28/2016)  . Cardiomyopathy   . Coronary artery disease   . Depression    hx (05/28/2016)  . Hemorrhoids   . Hyperlipidemia   . Hypertension   . Skipped heart beats     Past Surgical History:  Procedure Laterality Date  . APPENDECTOMY    . CARDIOVERSION N/A 06/04/2016   Procedure: CARDIOVERSION;  Surgeon: Pixie Casino, MD;  Location: [facility] ENDOSCOPY;  Service: Cardiovascular;  Laterality: N/A;  . CATARACT EXTRACTION Left   . CHOLECYSTECTOMY  OPEN    . COLONOSCOPY W/ BIOPSIES AND POLYPECTOMY    . INGUINAL HERNIA REPAIR Bilateral   . MYRINGOTOMY WITH TUBE PLACEMENT Bilateral   . skin cancer removal Right    side of nose by Rt eye  . TEE WITHOUT CARDIOVERSION N/A 06/04/2016   Procedure: TRANSESOPHAGEAL ECHOCARDIOGRAM (TEE);  Surgeon: Pixie Casino, MD;  Location: [facility] ENDOSCOPY;  Service: Cardiovascular;  Laterality: N/A;  . TONSILLECTOMY AND ADENOIDECTOMY      ROS:  As stated in the HPI and negative for all other systems.  PHYSICAL EXAM BP 108/63   Pulse 60   Ht 5\' 7"  (1.702 m)   Wt 196 lb (88.9 kg)   BMI 30.70 kg/m    GENERAL:  Well appearing HEENT:  Right lens opacification, fundi not visualized, oral mucosa unremarkable NECK:  No jugular venous distention, waveform within normal limits, carotid upstroke brisk and symmetric, no bruits, no thyromegaly LUNGS:  Clear to auscultation bilaterally HEART:  PMI not displaced or sustained,S1 and S2 within normal limits, no S3, no S4, no clicks, no rubs, no murmurs ABD:  Flat, positive bowel sounds normal in frequency in pitch, no bruits, no rebound, no guarding, no midline pulsatile mass, no hepatomegaly, no splenomegaly EXT:  2 plus pulses throughout, right greater than left lower extremity edema, no cyanosis no clubbing SKIN:  No rashes no nodules, multiple bruit, right nares wound healing  EKG:  Sinus bradycardia, rate 59  right bundle branch block, left anterior fascicular block  01/05/2017   ASSESSMENT AND PLAN  ATRIAL FIB:  He seems to be maintaining sinus rhythm.  Mr. Dayron Dietert has a CHA2DS2 - VASc score of 5 with a risk of stroke of 6.7%.  He will remain on anticoagulation. Because of his frail state and he is fluctuating creatinine on any keep him on the lower dose of Elavil was.  DILATED CARDIOMYOPATHY:    He seems to be euvolemic. I'll not make any changes to his therapy.  SYNCOPE:  He has had no further episodes.  CKD:   His creatinine in January was 1.27. No change in therapy is indicated.The patient has no new sypmtoms.  No further cardiovascular testing is indicated.  We will continue with aggressive risk reduction and meds as listed.  CAD:  The patient has no new sypmtoms.  No further cardiovascular testing is indicated.  We will continue with aggressive risk reduction and meds as listed.  HTN:   This is being managed in the context of treating his CHF  LEG SWELLING:  I ordered a lower extremity venous Doppler today.  This demonstrated a Baker's cyst but no clot.

## 2017-01-03 ENCOUNTER — Ambulatory Visit: Payer: Medicare Other | Admitting: Cardiology

## 2017-01-03 ENCOUNTER — Encounter: Payer: Self-pay | Admitting: Cardiology

## 2017-01-03 ENCOUNTER — Ambulatory Visit (HOSPITAL_COMMUNITY)
Admission: RE | Admit: 2017-01-03 | Discharge: 2017-01-03 | Disposition: A | Discharge status: Home / Self Care | Payer: Medicare Other | Source: Ambulatory Visit | Attending: Cardiology | Admitting: Cardiology

## 2017-01-03 ENCOUNTER — Ambulatory Visit (INDEPENDENT_AMBULATORY_CARE_PROVIDER_SITE_OTHER): Payer: Medicare Other | Admitting: Cardiology

## 2017-01-03 ENCOUNTER — Encounter (HOSPITAL_COMMUNITY): Payer: Self-pay

## 2017-01-03 VITALS — BP 108/63 | HR 60 | Ht 67.0 in | Wt 196.0 lb

## 2017-01-03 DIAGNOSIS — I480 Paroxysmal atrial fibrillation: Secondary | ICD-10-CM | POA: Diagnosis not present

## 2017-01-03 DIAGNOSIS — I8290 Acute embolism and thrombosis of unspecified vein: Secondary | ICD-10-CM | POA: Insufficient documentation

## 2017-01-03 DIAGNOSIS — M7989 Other specified soft tissue disorders: Secondary | ICD-10-CM

## 2017-01-03 DIAGNOSIS — M7122 Synovial cyst of popliteal space [Baker], left knee: Secondary | ICD-10-CM

## 2017-01-03 DIAGNOSIS — R937 Abnormal findings on diagnostic imaging of other parts of musculoskeletal system: Secondary | ICD-10-CM | POA: Diagnosis not present

## 2017-01-03 DIAGNOSIS — I5022 Chronic systolic (congestive) heart failure: Secondary | ICD-10-CM

## 2017-01-03 NOTE — Progress Notes (Signed)
Left lower extremity venous duplex on 01-02-17 is negative for DVT. There is a Baker's cyst behind the left knee. Preliminary results given to Dr. Percival Spanish via [facility].

## 2017-01-03 NOTE — Patient Instructions (Addendum)
Medication Instructions:  Continue current medications  Labwork: None Ordered  Testing/Procedures: Your physician has requested that you have a lower extremity venous duplex. This test is an ultrasound of the veins in the legs or arms. It looks at venous blood flow that carries blood from the heart to the legs or arms. Allow one hour for a Lower Venous exam. Allow thirty minutes for an Upper Venous exam. There are no restrictions or special instructions.   Follow-Up: Your physician recommends that you schedule a follow-up appointment in: 6 Months   Any Other Special Instructions Will Be Listed Below (If Applicable).   If you need a refill on your cardiac medications before your next appointment, please call your pharmacy.

## 2017-01-05 ENCOUNTER — Encounter: Payer: Self-pay | Admitting: Cardiology

## 2017-01-13 ENCOUNTER — Ambulatory Visit (INDEPENDENT_AMBULATORY_CARE_PROVIDER_SITE_OTHER): Payer: Medicare Other | Admitting: Internal Medicine

## 2017-01-13 ENCOUNTER — Encounter: Payer: Self-pay | Admitting: Internal Medicine

## 2017-01-13 ENCOUNTER — Other Ambulatory Visit (INDEPENDENT_AMBULATORY_CARE_PROVIDER_SITE_OTHER): Payer: Medicare Other

## 2017-01-13 VITALS — BP 126/72 | HR 70 | Temp 97.9°F | Resp 16 | Ht 67.0 in | Wt 195.8 lb

## 2017-01-13 DIAGNOSIS — M25562 Pain in left knee: Secondary | ICD-10-CM

## 2017-01-13 DIAGNOSIS — I420 Dilated cardiomyopathy: Secondary | ICD-10-CM | POA: Diagnosis not present

## 2017-01-13 DIAGNOSIS — I480 Paroxysmal atrial fibrillation: Secondary | ICD-10-CM

## 2017-01-13 DIAGNOSIS — I10 Essential (primary) hypertension: Secondary | ICD-10-CM

## 2017-01-13 DIAGNOSIS — I484 Atypical atrial flutter: Secondary | ICD-10-CM | POA: Diagnosis not present

## 2017-01-13 LAB — BASIC METABOLIC PANEL
BUN: 18 mg/dL (ref 6–23)
CHLORIDE: 105 meq/L (ref 96–112)
CO2: 25 mEq/L (ref 19–32)
Calcium: 8.9 mg/dL (ref 8.4–10.5)
Creatinine, Ser: 1.22 mg/dL (ref 0.40–1.50)
GFR: 58.66 mL/min — AB (ref >60.00)
Glucose, Bld: 88 mg/dL (ref 70–99)
POTASSIUM: 4.4 meq/L (ref 3.5–5.1)
SODIUM: 139 meq/L (ref 135–145)

## 2017-01-13 LAB — URIC ACID: URIC ACID, SERUM: 6.1 mg/dL (ref 4.0–7.8)

## 2017-01-13 LAB — SEDIMENTATION RATE: SED RATE: 29 mm/h — AB (ref 0–20)

## 2017-01-13 MED ORDER — COLCHICINE 0.6 MG PO TABS: 0.6000 mg | ORAL_TABLET | Freq: Two times a day (BID) | ORAL | 2 refills | Status: DC | PRN

## 2017-01-13 NOTE — Assessment & Plan Note (Signed)
On Eliquis

## 2017-01-13 NOTE — Assessment & Plan Note (Signed)
Re-start Furosemide

## 2017-01-13 NOTE — Assessment & Plan Note (Signed)
Worse ?gout Colchicine prn Labs

## 2017-01-13 NOTE — Progress Notes (Signed)
Subjective:  Patient ID: [name] [name], male    DOB: 10-18-1920  Age: 83 y.o. MRN: [id number]  CC: Follow-up (HTN, Congestive dilated cardiomyopathy); Leg Swelling (bilateral legs ); and Bleeding/Bruising (left thigh)   HPI [name] [name] presents for constipation, CHF f/u C/o L knee pain and swelling - went to go to [facility] ER by EMS Not taking Lasix  Outpatient Medications Prior to Visit  Medication Sig Dispense Refill  . ALPRAZolam (XANAX) 1 MG tablet TAKE ONE TABLET AT BEDTIME. 30 tablet 0  . ELIQUIS 2.5 MG TABS tablet TAKE 1 TABLET TWICE DAILY. 60 tablet 11  . finasteride (PROSCAR) 5 MG tablet Take 1 tablet (5 mg total) by mouth daily. 90 tablet 3  . linaclotide (LINZESS) 145 MCG CAPS capsule Take 1 capsule (145 mcg total) by mouth daily as needed. 16 capsule 0  . metoprolol succinate (TOPROL XL) 25 MG 24 hr tablet Take 1 tablet (25 mg total) by mouth daily. 30 tablet 2  . Multiple Vitamin (MULTIVITAMIN WITH MINERALS) TABS tablet Take 1 tablet by mouth daily.    . Multiple Vitamins-Minerals (ICAPS PO) Take 1 capsule by mouth daily.    . promethazine-codeine (PHENERGAN WITH CODEINE) 6.25-10 MG/5ML syrup TAKE 1 TEASPOONFUL (5ML) EVERY 4 HOURS AS NEEDED FOR COUGH. 300 mL 0  . psyllium (METAMUCIL) 58.6 % packet Take 1 packet by mouth at bedtime. Reported on 06/07/2016    . ranitidine (ZANTAC) 150 MG tablet Take 1 tablet by mouth 2 (two) times daily as needed for heartburn.   1  . furosemide (LASIX) 20 MG tablet Take 1 tablet (20 mg total) by mouth daily. (Patient not taking: Reported on 01/13/2017) 90 tablet 3  . simvastatin (ZOCOR) 20 MG tablet Take 1 tablet (20 mg total) by mouth at bedtime. (Patient not taking: Reported on 12/11/2016) 30 tablet 2   No facility-administered medications prior to visit.     ROS Review of Systems  Constitutional: Negative for appetite change, fatigue and unexpected weight change.  HENT: Negative for congestion, nosebleeds, sneezing, sore throat and trouble  swallowing.   Eyes: Negative for itching and visual disturbance.  Respiratory: Negative for cough and shortness of breath.   Cardiovascular: Positive for leg swelling. Negative for chest pain and palpitations.  Gastrointestinal: Negative for abdominal distention, blood in stool, diarrhea and nausea.  Genitourinary: Negative for frequency and hematuria.  Musculoskeletal: Positive for arthralgias, back pain and gait problem. Negative for joint swelling and neck pain.  Skin: Negative for rash.  Neurological: Negative for dizziness, tremors, speech difficulty and weakness.  Psychiatric/Behavioral: Negative for agitation, dysphoric mood and sleep disturbance. The patient is not nervous/anxious.     Objective:  BP 126/72   Pulse 70   Temp 97.9 F (36.6 C) (Oral)   Resp 16   Ht 5\' 7"  (1.702 m)   Wt 195 lb 12 oz (88.8 kg)   SpO2 97%   BMI 30.66 kg/m   BP Readings from Last 3 Encounters:  01/13/17 126/72  01/03/17 108/63  12/11/16 162/83    Wt Readings from Last 3 Encounters:  01/13/17 195 lb 12 oz (88.8 kg)  01/03/17 196 lb (88.9 kg)  12/11/16 193 lb (87.5 kg)    Physical Exam  Constitutional: He is oriented to person, place, and time. He appears well-developed. No distress.  NAD  HENT:  Mouth/Throat: Oropharynx is clear and moist.  Eyes: Conjunctivae are normal. Pupils are equal, round, and reactive to light.  Neck: Normal range of motion. No JVD present.  No thyromegaly present.  Cardiovascular: Normal heart sounds and intact distal pulses.  Exam reveals no gallop and no friction rub.   No murmur heard. Pulmonary/Chest: Effort normal and breath sounds normal. No respiratory distress. He has no wheezes. He has no rales. He exhibits no tenderness.  Abdominal: Soft. Bowel sounds are normal. He exhibits no distension and no mass. There is no tenderness. There is no rebound and no guarding.  Musculoskeletal: Normal range of motion. He exhibits edema and tenderness.    Lymphadenopathy:    He has no cervical adenopathy.  Neurological: He is alert and oriented to person, place, and time. He displays normal reflexes. No cranial nerve deficit. He exhibits normal muscle tone. He displays a negative Romberg sign. Coordination abnormal. Gait normal.  Skin: Skin is warm and dry. No rash noted.  Psychiatric: He has a normal mood and affect. His behavior is normal. Judgment and thought content normal.  irreg irreg L knee painful L inner thigh bruise Walker  Lab Results  Component Value Date   WBC 8.8 12/11/2016   HGB 14.0 12/11/2016   HCT 41.7 12/11/2016   PLT 130 (L) 12/11/2016   GLUCOSE 128 (H) 12/11/2016   CHOL 110 05/25/2013   TRIG 83.0 05/25/2013   HDL 39.00 (L) 05/25/2013   LDLCALC 54 05/25/2013   ALT 10 12/14/2015   AST 17 12/14/2015   NA 138 12/11/2016   K 3.9 12/11/2016   CL 102 12/11/2016   CREATININE 1.23 12/11/2016   BUN 26 (H) 12/11/2016   CO2 29 12/11/2016   TSH 2.42 05/23/2015   PSA 0.24 03/08/2014    No results found.  Assessment & Plan:   There are no diagnoses linked to this encounter. I have discontinued Mr. Mcnichol simvastatin. I am also having him maintain his psyllium, linaclotide, ELIQUIS, finasteride, multivitamin with minerals, Multiple Vitamins-Minerals (ICAPS PO), metoprolol succinate, furosemide, ranitidine, ALPRAZolam, and promethazine-codeine.  No orders of the defined types were placed in this encounter.    Follow-up: No Follow-up on file.  Walker Kehr, MD

## 2017-01-13 NOTE — Progress Notes (Signed)
Pre-visit discussion using our clinic review tool. No additional management support is needed unless otherwise documented below in the visit note.  

## 2017-01-13 NOTE — Assessment & Plan Note (Signed)
Toprol 

## 2017-01-13 NOTE — Assessment & Plan Note (Signed)
Toprol Re-start Furosemide

## 2017-01-21 ENCOUNTER — Ambulatory Visit: Payer: Medicare Other | Admitting: Internal Medicine

## 2017-02-03 ENCOUNTER — Other Ambulatory Visit: Payer: Self-pay | Admitting: Internal Medicine

## 2017-02-05 NOTE — Telephone Encounter (Signed)
called refill into [facility] spoke w/Beverly gave md approval.../lmb

## 2017-02-25 ENCOUNTER — Encounter: Payer: Self-pay | Admitting: Internal Medicine

## 2017-02-25 ENCOUNTER — Ambulatory Visit (INDEPENDENT_AMBULATORY_CARE_PROVIDER_SITE_OTHER): Payer: Medicare Other | Admitting: Internal Medicine

## 2017-02-25 DIAGNOSIS — I420 Dilated cardiomyopathy: Secondary | ICD-10-CM

## 2017-02-25 DIAGNOSIS — N183 Chronic kidney disease, stage 3 unspecified: Secondary | ICD-10-CM

## 2017-02-25 DIAGNOSIS — F5101 Primary insomnia: Secondary | ICD-10-CM

## 2017-02-25 DIAGNOSIS — R001 Bradycardia, unspecified: Secondary | ICD-10-CM

## 2017-02-25 DIAGNOSIS — R55 Syncope and collapse: Secondary | ICD-10-CM | POA: Diagnosis not present

## 2017-02-25 DIAGNOSIS — I480 Paroxysmal atrial fibrillation: Secondary | ICD-10-CM

## 2017-02-25 MED ORDER — PROMETHAZINE-CODEINE 6.25-10 MG/5ML PO SYRP: ORAL_SOLUTION | ORAL | 0 refills | Status: DC

## 2017-02-25 NOTE — Assessment & Plan Note (Signed)
Labs

## 2017-02-25 NOTE — Progress Notes (Signed)
Subjective:  Patient ID: [name] [name], male    DOB: 1920-06-15  Age: 83 y.o. MRN: [id number]  CC: No chief complaint on file.   HPI Nestor Wieneke presents for CHF, knee pain, insomnia f/u  Outpatient Medications Prior to Visit  Medication Sig Dispense Refill  . ALPRAZolam (XANAX) 1 MG tablet TAKE ONE TABLET AT BEDTIME. 30 tablet 0  . colchicine 0.6 MG tablet Take 1 tablet (0.6 mg total) by mouth 2 (two) times daily as needed. 30 tablet 2  . ELIQUIS 2.5 MG TABS tablet TAKE 1 TABLET TWICE DAILY. 60 tablet 11  . finasteride (PROSCAR) 5 MG tablet Take 1 tablet (5 mg total) by mouth daily. 90 tablet 3  . furosemide (LASIX) 20 MG tablet Take 1 tablet (20 mg total) by mouth daily. 90 tablet 3  . linaclotide (LINZESS) 145 MCG CAPS capsule Take 1 capsule (145 mcg total) by mouth daily as needed. 16 capsule 0  . metoprolol succinate (TOPROL XL) 25 MG 24 hr tablet Take 1 tablet (25 mg total) by mouth daily. 30 tablet 2  . Multiple Vitamin (MULTIVITAMIN WITH MINERALS) TABS tablet Take 1 tablet by mouth daily.    . Multiple Vitamins-Minerals (ICAPS PO) Take 1 capsule by mouth daily.    . promethazine-codeine (PHENERGAN WITH CODEINE) 6.25-10 MG/5ML syrup TAKE 1 TEASPOONFUL (5ML) EVERY 4 HOURS AS NEEDED FOR COUGH. 300 mL 0  . psyllium (METAMUCIL) 58.6 % packet Take 1 packet by mouth at bedtime. Reported on 06/07/2016    . ranitidine (ZANTAC) 150 MG tablet Take 1 tablet by mouth 2 (two) times daily as needed for heartburn.   1  . promethazine-codeine (PHENERGAN WITH CODEINE) 6.25-10 MG/5ML syrup TAKE 1 TEASPOONFUL (5ML) EVERY 4 HOURS AS NEEDED FOR COUGH. 300 mL 0   No facility-administered medications prior to visit.     ROS Review of Systems  Constitutional: Positive for fatigue. Negative for appetite change and unexpected weight change.  HENT: Negative for congestion, nosebleeds, sneezing, sore throat and trouble swallowing.   Eyes: Negative for itching and visual disturbance.  Respiratory: Negative  for cough.   Cardiovascular: Negative for chest pain, palpitations and leg swelling.  Gastrointestinal: Negative for abdominal distention, blood in stool, diarrhea and nausea.  Genitourinary: Negative for frequency and hematuria.  Musculoskeletal: Positive for arthralgias and gait problem. Negative for back pain, joint swelling and neck pain.  Skin: Negative for rash.  Neurological: Negative for dizziness, tremors, speech difficulty and weakness.  Psychiatric/Behavioral: Positive for sleep disturbance. Negative for agitation, dysphoric mood and suicidal ideas. The patient is not nervous/anxious.     Objective:  BP (!) 105/58 (BP Location: Left Arm, Patient Position: Sitting, Cuff Size: Normal)   Pulse (!) 50   Temp 97.6 F (36.4 C) (Oral)   Ht 5\' 7"  (1.702 m)   Wt 194 lb (88 kg)   SpO2 98%   BMI 30.38 kg/m   BP Readings from Last 3 Encounters:  02/25/17 (!) 105/58  01/13/17 126/72  01/03/17 108/63    Wt Readings from Last 3 Encounters:  02/25/17 194 lb (88 kg)  01/13/17 195 lb 12 oz (88.8 kg)  01/03/17 196 lb (88.9 kg)    Physical Exam  Constitutional: He is oriented to person, place, and time. He appears well-developed. No distress.  NAD  HENT:  Mouth/Throat: Oropharynx is clear and moist.  Eyes: Conjunctivae are normal. Pupils are equal, round, and reactive to light.  Neck: Normal range of motion. No JVD present. No thyromegaly present.  Cardiovascular: Normal rate, regular rhythm, normal heart sounds and intact distal pulses.  Exam reveals no gallop and no friction rub.   No murmur heard. Pulmonary/Chest: Effort normal and breath sounds normal. No respiratory distress. He has no wheezes. He has no rales. He exhibits no tenderness.  Abdominal: Soft. Bowel sounds are normal. He exhibits no distension and no mass. There is no tenderness. There is no rebound and no guarding.  Musculoskeletal: Normal range of motion. He exhibits tenderness. He exhibits no edema.    Lymphadenopathy:    He has no cervical adenopathy.  Neurological: He is alert and oriented to person, place, and time. He has normal reflexes. No cranial nerve deficit. He exhibits normal muscle tone. He displays a negative Romberg sign. Coordination abnormal. Gait normal.  Skin: Skin is warm and dry. No rash noted.  Psychiatric: He has a normal mood and affect. His behavior is normal. Judgment and thought content normal.  L knee is tender bradycardic  Lab Results  Component Value Date   WBC 8.8 12/11/2016   HGB 14.0 12/11/2016   HCT 41.7 12/11/2016   PLT 130 (L) 12/11/2016   GLUCOSE 88 01/13/2017   CHOL 110 05/25/2013   TRIG 83.0 05/25/2013   HDL 39.00 (L) 05/25/2013   LDLCALC 54 05/25/2013   ALT 10 12/14/2015   AST 17 12/14/2015   NA 139 01/13/2017   K 4.4 01/13/2017   CL 105 01/13/2017   CREATININE 1.22 01/13/2017   BUN 18 01/13/2017   CO2 25 01/13/2017   TSH 2.42 05/23/2015   PSA 0.24 03/08/2014    No results found.  Assessment & Plan:   There are no diagnoses linked to this encounter. I am having Mr. Morten maintain his psyllium, linaclotide, ELIQUIS, finasteride, multivitamin with minerals, Multiple Vitamins-Minerals (ICAPS PO), metoprolol succinate, furosemide, ranitidine, ALPRAZolam, promethazine-codeine, colchicine, simvastatin, and traMADol.  Meds ordered this encounter  Medications  . simvastatin (ZOCOR) 40 MG tablet    Sig: TK SS T PO HS. DO NOT TAKE WITH AMIODARONE.    Refill:  2  . traMADol (ULTRAM) 50 MG tablet    Refill:  0     Follow-up: No Follow-up on file.  Walker Kehr, MD

## 2017-02-25 NOTE — Progress Notes (Signed)
Pre visit review using our clinic review tool, if applicable. No additional management support is needed unless otherwise documented below in the visit note. 

## 2017-02-25 NOTE — Assessment & Plan Note (Signed)
HR 50. No sx's

## 2017-02-25 NOTE — Assessment & Plan Note (Signed)
Eliquis 

## 2017-02-25 NOTE — Assessment & Plan Note (Signed)
Sleeping better

## 2017-02-25 NOTE — Assessment & Plan Note (Signed)
Toprol and Eliquis

## 2017-02-25 NOTE — Assessment & Plan Note (Signed)
Better No relapse

## 2017-02-27 ENCOUNTER — Telehealth: Payer: Self-pay | Admitting: Cardiology

## 2017-02-27 NOTE — Telephone Encounter (Signed)
Returned the phone call to the patient. He stated that since he started Eliquis 2.5 mg (9/22) that he has had problems with excessive bleeding from his hemorrhoids. He stated this occurs roughly once every two weeks but bleeding will last around 5-10 minutes. He stated that he has trouble trying to hold pressure by himself so he will wear a pad. He has not tried any hemorrhoidal cream or medication. He stated that he wanted to know if he could take a lesser amount of the Eliquis. Please advise.

## 2017-02-27 NOTE — Telephone Encounter (Signed)
Eliquis 2.5mg  is the lowest available dose.  We can consider change to Xarelto or warfarin.  Recommendation:  1.  PCP (Dr  Alain Marion) or GI doctor assess hemorrhoids to determine if additionnal intervention is needed.   2. HOLD eliquis x 2 doses today if bleeding, then resume normal dose until re-assessment.

## 2017-02-27 NOTE — Telephone Encounter (Signed)
Returned the phone call to the patient. He stated that the bleeding had ended. Advised him that if he were to bleed again that he may hold the Eliquis for 2 doses but then resume and be assessed by a physician. He was also advised to see Dr. Alain Marion or a GI physician about this problem. He verbalized that he understood.

## 2017-02-27 NOTE — Telephone Encounter (Signed)
Pt is having excessive bleeding from his Hemorrhoids.He says it is not constant,but it is excessive. Pt is concerned ,he is on Eliquis.

## 2017-03-03 DIAGNOSIS — H524 Presbyopia: Secondary | ICD-10-CM | POA: Diagnosis not present

## 2017-03-04 ENCOUNTER — Other Ambulatory Visit: Payer: Self-pay | Admitting: Cardiology

## 2017-03-06 ENCOUNTER — Other Ambulatory Visit: Payer: Self-pay | Admitting: Cardiology

## 2017-03-07 ENCOUNTER — Telehealth: Payer: Self-pay | Admitting: *Deleted

## 2017-03-07 MED ORDER — ALPRAZOLAM 1 MG PO TABS: 1.0000 mg | ORAL_TABLET | Freq: Every day | ORAL | 0 refills | Status: DC

## 2017-03-07 NOTE — Telephone Encounter (Signed)
Patient came in as a walk in for a refill on his Alprazolam 1mg . Per previous refill request, MD okayed 30 tablets with no refills. The refill has been called in to [facility] at [facility]. The patient has been notified.

## 2017-03-12 ENCOUNTER — Encounter: Payer: Self-pay | Admitting: Internal Medicine

## 2017-04-03 ENCOUNTER — Ambulatory Visit (INDEPENDENT_AMBULATORY_CARE_PROVIDER_SITE_OTHER)
Admission: RE | Admit: 2017-04-03 | Discharge: 2017-04-03 | Disposition: A | Discharge status: Home / Self Care | Payer: Medicare Other | Source: Ambulatory Visit | Attending: Nurse Practitioner | Admitting: Nurse Practitioner

## 2017-04-03 ENCOUNTER — Ambulatory Visit (INDEPENDENT_AMBULATORY_CARE_PROVIDER_SITE_OTHER): Payer: Medicare Other | Admitting: Nurse Practitioner

## 2017-04-03 ENCOUNTER — Encounter: Payer: Self-pay | Admitting: Nurse Practitioner

## 2017-04-03 DIAGNOSIS — J90 Pleural effusion, not elsewhere classified: Secondary | ICD-10-CM

## 2017-04-03 DIAGNOSIS — R071 Chest pain on breathing: Secondary | ICD-10-CM

## 2017-04-03 DIAGNOSIS — S2232XA Fracture of one rib, left side, initial encounter for closed fracture: Secondary | ICD-10-CM | POA: Diagnosis not present

## 2017-04-03 MED ORDER — LEVOFLOXACIN 500 MG PO TABS: 500.0000 mg | ORAL_TABLET | Freq: Every day | ORAL | 0 refills | Status: DC

## 2017-04-03 MED ORDER — ACETAMINOPHEN-CODEINE #2 300-15 MG PO TABS: 1.0000 | ORAL_TABLET | Freq: Three times a day (TID) | ORAL | 0 refills | Status: DC | PRN

## 2017-04-03 MED ORDER — BENZONATATE 100 MG PO CAPS: 100.0000 mg | ORAL_CAPSULE | Freq: Three times a day (TID) | ORAL | 0 refills | Status: DC | PRN
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
## 2017-04-03 NOTE — Patient Instructions (Signed)
Rib Fracture A rib fracture is a break or crack in one of the bones of the ribs. The ribs are like a cage that goes around your upper chest. A broken or cracked rib is often painful, but most do not cause other problems. Most rib fractures heal on their own in 1-3 months. Follow these instructions at home:  Avoid activities that cause pain to the injured area. Protect your injured area.  Slowly increase activity as told by your doctor.  Take medicine as told by your doctor.  Put ice on the injured area for the first 1-2 days after you have been treated or as told by your doctor.  Put ice in a plastic bag.  Place a towel between your skin and the bag.  Leave the ice on for 15-20 minutes at a time, every 2 hours while you are awake.  Do deep breathing as told by your doctor. You may be told to:  Take deep breaths many times a day.  Cough many times a day while hugging a pillow.  Use a device (incentive spirometer) to perform deep breathing many times a day.  Drink enough fluids to keep your pee (urine) clear or pale yellow.  Do not wear a rib belt or binder. These do not allow you to breathe deeply. Get help right away if:  You have a fever.  You have trouble breathing.  You cannot stop coughing.  You cough up thick or bloody spit (mucus).  You feel sick to your stomach (nauseous), throw up (vomit), or have belly (abdominal) pain.  Your pain gets worse and medicine does not help. This information is not intended to replace advice given to you by your health care provider. Make sure you discuss any questions you have with your health care provider. Document Released: 08/13/2008 Document Revised: 04/11/2016 Document Reviewed: 01/06/2013 Elsevier Interactive Patient Education  2017 [employer].

## 2017-04-06 ENCOUNTER — Emergency Department (HOSPITAL_COMMUNITY): Payer: Medicare Other

## 2017-04-06 ENCOUNTER — Inpatient Hospital Stay (HOSPITAL_COMMUNITY)
Admission: EM | Admit: 2017-04-06 | Discharge: 2017-04-14 | DRG: 309 | Disposition: A | Discharge status: Home Health | Payer: Medicare Other | Attending: Internal Medicine | Admitting: Internal Medicine

## 2017-04-06 ENCOUNTER — Encounter (HOSPITAL_COMMUNITY): Payer: Self-pay

## 2017-04-06 DIAGNOSIS — I2510 Atherosclerotic heart disease of native coronary artery without angina pectoris: Secondary | ICD-10-CM | POA: Diagnosis present

## 2017-04-06 DIAGNOSIS — E785 Hyperlipidemia, unspecified: Secondary | ICD-10-CM | POA: Diagnosis not present

## 2017-04-06 DIAGNOSIS — I130 Hypertensive heart and chronic kidney disease with heart failure and stage 1 through stage 4 chronic kidney disease, or unspecified chronic kidney disease: Secondary | ICD-10-CM | POA: Diagnosis present

## 2017-04-06 DIAGNOSIS — I480 Paroxysmal atrial fibrillation: Secondary | ICD-10-CM | POA: Diagnosis not present

## 2017-04-06 DIAGNOSIS — Z8249 Family history of ischemic heart disease and other diseases of the circulatory system: Secondary | ICD-10-CM

## 2017-04-06 DIAGNOSIS — R000 Tachycardia, unspecified: Secondary | ICD-10-CM | POA: Diagnosis not present

## 2017-04-06 DIAGNOSIS — I4891 Unspecified atrial fibrillation: Secondary | ICD-10-CM

## 2017-04-06 DIAGNOSIS — N183 Chronic kidney disease, stage 3 unspecified: Secondary | ICD-10-CM | POA: Diagnosis present

## 2017-04-06 DIAGNOSIS — K5900 Constipation, unspecified: Secondary | ICD-10-CM | POA: Diagnosis present

## 2017-04-06 DIAGNOSIS — S2241XA Multiple fractures of ribs, right side, initial encounter for closed fracture: Secondary | ICD-10-CM

## 2017-04-06 DIAGNOSIS — I493 Ventricular premature depolarization: Secondary | ICD-10-CM | POA: Diagnosis present

## 2017-04-06 DIAGNOSIS — Z9049 Acquired absence of other specified parts of digestive tract: Secondary | ICD-10-CM | POA: Diagnosis not present

## 2017-04-06 DIAGNOSIS — Z7901 Long term (current) use of anticoagulants: Secondary | ICD-10-CM | POA: Diagnosis not present

## 2017-04-06 DIAGNOSIS — N179 Acute kidney failure, unspecified: Secondary | ICD-10-CM | POA: Diagnosis present

## 2017-04-06 DIAGNOSIS — R0602 Shortness of breath: Secondary | ICD-10-CM | POA: Diagnosis not present

## 2017-04-06 DIAGNOSIS — R0902 Hypoxemia: Secondary | ICD-10-CM

## 2017-04-06 DIAGNOSIS — I5022 Chronic systolic (congestive) heart failure: Secondary | ICD-10-CM | POA: Diagnosis present

## 2017-04-06 DIAGNOSIS — I10 Essential (primary) hypertension: Secondary | ICD-10-CM | POA: Diagnosis not present

## 2017-04-06 DIAGNOSIS — D696 Thrombocytopenia, unspecified: Secondary | ICD-10-CM | POA: Diagnosis present

## 2017-04-06 DIAGNOSIS — S0990XA Unspecified injury of head, initial encounter: Secondary | ICD-10-CM | POA: Diagnosis not present

## 2017-04-06 DIAGNOSIS — I4892 Unspecified atrial flutter: Secondary | ICD-10-CM | POA: Diagnosis present

## 2017-04-06 DIAGNOSIS — R109 Unspecified abdominal pain: Secondary | ICD-10-CM | POA: Diagnosis not present

## 2017-04-06 DIAGNOSIS — I420 Dilated cardiomyopathy: Secondary | ICD-10-CM

## 2017-04-06 DIAGNOSIS — S270XXA Traumatic pneumothorax, initial encounter: Secondary | ICD-10-CM | POA: Diagnosis not present

## 2017-04-06 DIAGNOSIS — S2243XA Multiple fractures of ribs, bilateral, initial encounter for closed fracture: Secondary | ICD-10-CM | POA: Diagnosis not present

## 2017-04-06 DIAGNOSIS — I129 Hypertensive chronic kidney disease with stage 1 through stage 4 chronic kidney disease, or unspecified chronic kidney disease: Secondary | ICD-10-CM | POA: Diagnosis not present

## 2017-04-06 DIAGNOSIS — S2239XA Fracture of one rib, unspecified side, initial encounter for closed fracture: Secondary | ICD-10-CM | POA: Diagnosis present

## 2017-04-06 DIAGNOSIS — S2231XA Fracture of one rib, right side, initial encounter for closed fracture: Secondary | ICD-10-CM | POA: Diagnosis not present

## 2017-04-06 DIAGNOSIS — J939 Pneumothorax, unspecified: Secondary | ICD-10-CM | POA: Diagnosis present

## 2017-04-06 DIAGNOSIS — J90 Pleural effusion, not elsewhere classified: Secondary | ICD-10-CM

## 2017-04-06 DIAGNOSIS — Y92410 Unspecified street and highway as the place of occurrence of the external cause: Secondary | ICD-10-CM

## 2017-04-06 DIAGNOSIS — S199XXA Unspecified injury of neck, initial encounter: Secondary | ICD-10-CM | POA: Diagnosis not present

## 2017-04-06 DIAGNOSIS — S3991XA Unspecified injury of abdomen, initial encounter: Secondary | ICD-10-CM | POA: Diagnosis not present

## 2017-04-06 DIAGNOSIS — I359 Nonrheumatic aortic valve disorder, unspecified: Secondary | ICD-10-CM | POA: Diagnosis not present

## 2017-04-06 LAB — BASIC METABOLIC PANEL
Anion gap: 7 (ref 5–15)
BUN: 45 mg/dL — AB (ref 6–20)
CALCIUM: 8.3 mg/dL — AB (ref 8.9–10.3)
CO2: 26 mmol/L (ref 22–32)
CREATININE: 2.16 mg/dL — AB (ref 0.61–1.24)
Chloride: 104 mmol/L (ref 101–111)
GFR calc non Af Amer: 24 mL/min — ABNORMAL LOW (ref >60)
GFR, EST AFRICAN AMERICAN: 28 mL/min — AB (ref >60)
Glucose, Bld: 121 mg/dL — ABNORMAL HIGH (ref 65–99)
Potassium: 5.1 mmol/L (ref 3.5–5.1)
SODIUM: 137 mmol/L (ref 135–145)

## 2017-04-06 LAB — CBC
HEMATOCRIT: 46.4 % (ref 39.0–52.0)
HEMOGLOBIN: 15.7 g/dL (ref 13.0–17.0)
MCH: 31.2 pg (ref 26.0–34.0)
MCHC: 33.8 g/dL (ref 30.0–36.0)
MCV: 92.2 fL (ref 78.0–100.0)
Platelets: 141 10*3/uL — ABNORMAL LOW (ref 150–400)
RBC: 5.03 MIL/uL (ref 4.22–5.81)
RDW: 15.2 % (ref 11.5–15.5)
WBC: 9.2 10*3/uL (ref 4.0–10.5)

## 2017-04-06 LAB — MRSA PCR SCREENING: MRSA BY PCR: NEGATIVE

## 2017-04-06 LAB — TYPE AND SCREEN
ABO/RH(D): O POS
Antibody Screen: NEGATIVE

## 2017-04-06 LAB — I-STAT CHEM 8, ED
BUN: 48 mg/dL — ABNORMAL HIGH (ref 6–20)
CALCIUM ION: 1.02 mmol/L — AB (ref 1.15–1.40)
CREATININE: 2.2 mg/dL — AB (ref 0.61–1.24)
Chloride: 103 mmol/L (ref 101–111)
Glucose, Bld: 119 mg/dL — ABNORMAL HIGH (ref 65–99)
HEMATOCRIT: 46 % (ref 39.0–52.0)
HEMOGLOBIN: 15.6 g/dL (ref 13.0–17.0)
Potassium: 4.4 mmol/L (ref 3.5–5.1)
SODIUM: 137 mmol/L (ref 135–145)
TCO2: 27 mmol/L (ref 0–100)

## 2017-04-06 LAB — HEPATIC FUNCTION PANEL
ALBUMIN: 3.1 g/dL — AB (ref 3.5–5.0)
ALT: 61 U/L (ref 17–63)
AST: 37 U/L (ref 15–41)
Alkaline Phosphatase: 12 U/L — ABNORMAL LOW (ref 38–126)
Bilirubin, Direct: 0.3 mg/dL (ref 0.1–0.5)
Indirect Bilirubin: 1.3 mg/dL — ABNORMAL HIGH (ref 0.3–0.9)
TOTAL PROTEIN: 6.5 g/dL (ref 6.5–8.1)
Total Bilirubin: 1.6 mg/dL — ABNORMAL HIGH (ref 0.3–1.2)

## 2017-04-06 LAB — BRAIN NATRIURETIC PEPTIDE: B Natriuretic Peptide: 574.4 pg/mL — ABNORMAL HIGH (ref 0.0–100.0)

## 2017-04-06 LAB — LIPASE, BLOOD: LIPASE: 28 U/L (ref 11–51)

## 2017-04-06 LAB — I-STAT TROPONIN, ED: Troponin i, poc: 0.03 ng/mL (ref 0.00–0.08)

## 2017-04-06 LAB — PROTIME-INR
INR: 1.34
PROTHROMBIN TIME: 16.7 s — AB (ref 11.4–15.2)

## 2017-04-06 LAB — I-STAT CG4 LACTIC ACID, ED: Lactic Acid, Venous: 1.31 mmol/L (ref 0.5–1.9)

## 2017-04-06 LAB — ABO/RH: ABO/RH(D): O POS

## 2017-04-06 MED ORDER — SIMVASTATIN 20 MG PO TABS
20.0000 mg | ORAL_TABLET | Freq: Every day | ORAL | Status: DC
Start: 2017-04-07 — End: 2017-04-14
  Administered 2017-04-07 – 2017-04-13 (×7): 20 mg via ORAL
  Filled 2017-04-06: qty 2
  Filled 2017-04-06 (×2): qty 1
  Filled 2017-04-06 (×3): qty 2
  Filled 2017-04-06: qty 1

## 2017-04-06 MED ORDER — ALPRAZOLAM 1 MG PO TABS
1.0000 mg | ORAL_TABLET | Freq: Every day | ORAL | Status: DC
  Administered 2017-04-06 – 2017-04-13 (×8): 1 mg via ORAL
  Filled 2017-04-06 (×8): qty 1

## 2017-04-06 MED ORDER — SODIUM CHLORIDE 0.9% FLUSH
3.0000 mL | Freq: Two times a day (BID) | INTRAVENOUS | Status: DC
  Administered 2017-04-06 – 2017-04-11 (×5): 3 mL via INTRAVENOUS

## 2017-04-06 MED ORDER — SENNOSIDES-DOCUSATE SODIUM 8.6-50 MG PO TABS
1.0000 | ORAL_TABLET | Freq: Every evening | ORAL | Status: DC | PRN
  Administered 2017-04-07: 1 via ORAL
  Filled 2017-04-06: qty 1

## 2017-04-06 MED ORDER — SODIUM CHLORIDE 0.9 % IV SOLN: 250.0000 mL | INTRAVENOUS | Status: DC | PRN

## 2017-04-06 MED ORDER — APIXABAN 2.5 MG PO TABS
2.5000 mg | ORAL_TABLET | Freq: Two times a day (BID) | ORAL | Status: DC
  Administered 2017-04-06 – 2017-04-14 (×16): 2.5 mg via ORAL
  Filled 2017-04-06 (×15): qty 1

## 2017-04-06 MED ORDER — SODIUM CHLORIDE 0.9% FLUSH
3.0000 mL | Freq: Two times a day (BID) | INTRAVENOUS | Status: DC
  Administered 2017-04-06 – 2017-04-13 (×6): 3 mL via INTRAVENOUS

## 2017-04-06 MED ORDER — FUROSEMIDE 40 MG PO TABS
40.0000 mg | ORAL_TABLET | Freq: Every day | ORAL | Status: DC
  Administered 2017-04-06 – 2017-04-14 (×9): 40 mg via ORAL
  Filled 2017-04-06 (×9): qty 1

## 2017-04-06 MED ORDER — ALBUTEROL SULFATE (2.5 MG/3ML) 0.083% IN NEBU: 2.5000 mg | INHALATION_SOLUTION | RESPIRATORY_TRACT | Status: DC | PRN

## 2017-04-06 MED ORDER — AMIODARONE HCL IN DEXTROSE 360-4.14 MG/200ML-% IV SOLN
60.0000 mg/h | INTRAVENOUS | Status: AC
  Administered 2017-04-06: 60 mg/h via INTRAVENOUS
  Filled 2017-04-06 (×2): qty 200

## 2017-04-06 MED ORDER — FENTANYL CITRATE (PF) 100 MCG/2ML IJ SOLN
50.0000 ug | Freq: Once | INTRAMUSCULAR | Status: AC
  Administered 2017-04-06: 50 ug via INTRAVENOUS
  Filled 2017-04-06: qty 2

## 2017-04-06 MED ORDER — ONDANSETRON HCL 4 MG PO TABS: 4.0000 mg | ORAL_TABLET | Freq: Four times a day (QID) | ORAL | Status: DC | PRN

## 2017-04-06 MED ORDER — SODIUM CHLORIDE 0.9 % IV BOLUS (SEPSIS)
500.0000 mL | Freq: Once | INTRAVENOUS | Status: AC
  Administered 2017-04-06: 500 mL via INTRAVENOUS

## 2017-04-06 MED ORDER — ONDANSETRON HCL 4 MG/2ML IJ SOLN: 4.0000 mg | Freq: Four times a day (QID) | INTRAMUSCULAR | Status: DC | PRN

## 2017-04-06 MED ORDER — ADULT MULTIVITAMIN W/MINERALS CH
1.0000 | ORAL_TABLET | Freq: Every day | ORAL | Status: DC
  Administered 2017-04-06 – 2017-04-14 (×9): 1 via ORAL
  Filled 2017-04-06 (×10): qty 1

## 2017-04-06 MED ORDER — SODIUM CHLORIDE 0.9% FLUSH: 3.0000 mL | INTRAVENOUS | Status: DC | PRN

## 2017-04-06 MED ORDER — TRAMADOL HCL 50 MG PO TABS
50.0000 mg | ORAL_TABLET | Freq: Four times a day (QID) | ORAL | Status: DC | PRN
  Administered 2017-04-06 – 2017-04-07 (×3): 50 mg via ORAL
  Filled 2017-04-06 (×4): qty 1

## 2017-04-06 MED ORDER — FUROSEMIDE 10 MG/ML IJ SOLN
40.0000 mg | INTRAMUSCULAR | Status: AC
  Administered 2017-04-06: 40 mg via INTRAVENOUS
  Filled 2017-04-06: qty 4

## 2017-04-06 MED ORDER — FINASTERIDE 5 MG PO TABS
5.0000 mg | ORAL_TABLET | Freq: Every day | ORAL | Status: DC
Start: 2017-04-06 — End: 2017-04-14
  Administered 2017-04-06 – 2017-04-14 (×9): 5 mg via ORAL
  Filled 2017-04-06 (×9): qty 1

## 2017-04-06 MED ORDER — ACETAMINOPHEN 500 MG PO TABS
1000.0000 mg | ORAL_TABLET | Freq: Three times a day (TID) | ORAL | Status: DC
  Administered 2017-04-06 – 2017-04-14 (×23): 1000 mg via ORAL
  Filled 2017-04-06 (×24): qty 2

## 2017-04-06 MED ORDER — AMIODARONE HCL IN DEXTROSE 360-4.14 MG/200ML-% IV SOLN
30.0000 mg/h | INTRAVENOUS | Status: DC
  Administered 2017-04-07 – 2017-04-09 (×5): 30 mg/h via INTRAVENOUS
  Filled 2017-04-06 (×5): qty 200

## 2017-04-06 MED ORDER — AMIODARONE LOAD VIA INFUSION
150.0000 mg | Freq: Once | INTRAVENOUS | Status: AC
  Administered 2017-04-06: 150 mg via INTRAVENOUS
  Filled 2017-04-06: qty 83.34

## 2017-04-06 NOTE — ED Notes (Signed)
Pt family call to made of aware of pt moving to the ICU but no one answer the phone will try again.

## 2017-04-06 NOTE — ED Notes (Signed)
Pt family phone # if need call this phone #- Dorena Bodo  [phone] RE: Darelle Kings, SR

## 2017-04-06 NOTE — ED Notes (Signed)
Bed: WA13 Expected date:  Expected time:  Means of arrival:  Comments: 
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
## 2017-04-06 NOTE — ED Notes (Signed)
Main lab called to recollect labs
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
## 2017-04-07 ENCOUNTER — Inpatient Hospital Stay (HOSPITAL_COMMUNITY): Payer: Medicare Other

## 2017-04-07 DIAGNOSIS — I5022 Chronic systolic (congestive) heart failure: Secondary | ICD-10-CM

## 2017-04-07 DIAGNOSIS — I4891 Unspecified atrial fibrillation: Secondary | ICD-10-CM

## 2017-04-07 DIAGNOSIS — I359 Nonrheumatic aortic valve disorder, unspecified: Secondary | ICD-10-CM

## 2017-04-07 LAB — BASIC METABOLIC PANEL
ANION GAP: 9 (ref 5–15)
BUN: 49 mg/dL — AB (ref 6–20)
CHLORIDE: 100 mmol/L — AB (ref 101–111)
CO2: 29 mmol/L (ref 22–32)
Calcium: 8.2 mg/dL — ABNORMAL LOW (ref 8.9–10.3)
Creatinine, Ser: 2.18 mg/dL — ABNORMAL HIGH (ref 0.61–1.24)
GFR calc Af Amer: 28 mL/min — ABNORMAL LOW (ref >60)
GFR, EST NON AFRICAN AMERICAN: 24 mL/min — AB (ref >60)
Glucose, Bld: 117 mg/dL — ABNORMAL HIGH (ref 65–99)
POTASSIUM: 4.4 mmol/L (ref 3.5–5.1)
SODIUM: 138 mmol/L (ref 135–145)

## 2017-04-07 LAB — CBC
HCT: 43.3 % (ref 39.0–52.0)
HEMOGLOBIN: 14 g/dL (ref 13.0–17.0)
MCH: 30 pg (ref 26.0–34.0)
MCHC: 32.3 g/dL (ref 30.0–36.0)
MCV: 92.7 fL (ref 78.0–100.0)
Platelets: 111 10*3/uL — ABNORMAL LOW (ref 150–400)
RBC: 4.67 MIL/uL (ref 4.22–5.81)
RDW: 14.7 % (ref 11.5–15.5)
WBC: 6.1 10*3/uL (ref 4.0–10.5)

## 2017-04-07 LAB — TSH: TSH: 1.573 u[IU]/mL (ref 0.350–4.500)

## 2017-04-07 LAB — ECHOCARDIOGRAM COMPLETE
Height: 67 in
Weight: 3093.49 oz

## 2017-04-07 MED ORDER — AMIODARONE LOAD VIA INFUSION
150.0000 mg | Freq: Once | INTRAVENOUS | Status: AC
  Administered 2017-04-07: 150 mg via INTRAVENOUS

## 2017-04-07 MED ORDER — MORPHINE SULFATE (PF) 4 MG/ML IV SOLN
1.0000 mg | Freq: Once | INTRAVENOUS | Status: AC
Start: 2017-04-07 — End: 2017-04-07
  Administered 2017-04-07: 1 mg via INTRAVENOUS
  Filled 2017-04-07: qty 1

## 2017-04-07 MED ORDER — GUAIFENESIN-DM 100-10 MG/5ML PO SYRP
5.0000 mL | ORAL_SOLUTION | ORAL | Status: DC | PRN
  Administered 2017-04-07 – 2017-04-12 (×10): 5 mL via ORAL
  Filled 2017-04-07 (×11): qty 10
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
## 2017-04-07 NOTE — Progress Notes (Signed)
  Echocardiogram 2D Echocardiogram has been performed.  [name] [name] 04/07/2017, 10:16 AM
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
## 2017-04-07 NOTE — Progress Notes (Signed)
   Heart rate has increased to 130's. Will re bolus with IV amio. Remember, had bradycardia previously with metoprolol.   Candee Furbish, MD
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
## 2017-04-08 LAB — BASIC METABOLIC PANEL
Anion gap: 10 (ref 5–15)
BUN: 43 mg/dL — ABNORMAL HIGH (ref 6–20)
CALCIUM: 7.9 mg/dL — AB (ref 8.9–10.3)
CHLORIDE: 95 mmol/L — AB (ref 101–111)
CO2: 29 mmol/L (ref 22–32)
CREATININE: 2 mg/dL — AB (ref 0.61–1.24)
GFR calc non Af Amer: 27 mL/min — ABNORMAL LOW (ref >60)
GFR, EST AFRICAN AMERICAN: 31 mL/min — AB (ref >60)
Glucose, Bld: 92 mg/dL (ref 65–99)
Potassium: 4.1 mmol/L (ref 3.5–5.1)
SODIUM: 134 mmol/L — AB (ref 135–145)

## 2017-04-08 MED ORDER — SODIUM CHLORIDE 0.9% FLUSH
3.0000 mL | Freq: Two times a day (BID) | INTRAVENOUS | Status: DC
  Administered 2017-04-10 – 2017-04-13 (×7): 3 mL via INTRAVENOUS

## 2017-04-08 MED ORDER — SODIUM CHLORIDE 0.9% FLUSH: 3.0000 mL | INTRAVENOUS | Status: DC | PRN

## 2017-04-08 MED ORDER — SODIUM CHLORIDE 0.9 % IV SOLN
INTRAVENOUS | Status: DC
  Administered 2017-04-09: 06:00:00 via INTRAVENOUS

## 2017-04-08 MED ORDER — SODIUM CHLORIDE 0.9 % IV SOLN: 250.0000 mL | INTRAVENOUS | Status: DC

## 2017-04-08 MED ORDER — DOCUSATE SODIUM 100 MG PO CAPS
100.0000 mg | ORAL_CAPSULE | Freq: Two times a day (BID) | ORAL | Status: DC
  Administered 2017-04-08 – 2017-04-14 (×9): 100 mg via ORAL
  Filled 2017-04-08 (×11): qty 1

## 2017-04-08 MED ORDER — HYDROCOD POLST-CPM POLST ER 10-8 MG/5ML PO SUER
5.0000 mL | Freq: Once | ORAL | Status: AC
Start: 2017-04-08 — End: 2017-04-08
  Administered 2017-04-08: 5 mL via ORAL
  Filled 2017-04-08: qty 5

## 2017-04-08 MED ORDER — POLYETHYLENE GLYCOL 3350 17 G PO PACK
17.0000 g | PACK | Freq: Every day | ORAL | Status: DC
  Administered 2017-04-08 – 2017-04-14 (×5): 17 g via ORAL
  Filled 2017-04-08 (×6): qty 1
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
## 2017-04-08 NOTE — Progress Notes (Signed)
Pharmacist Heart Failure Core Measure Documentation  Assessment: [name] [name] has an EF documented as 25-30% on 04/07/17 by ECHO.  Rationale: Heart failure patients with left ventricular systolic dysfunction (LVSD) and an EF < 40% should be prescribed an angiotensin converting enzyme inhibitor (ACEI) or angiotensin receptor blocker (ARB) at discharge unless a contraindication is documented in the medical record.  This patient is not currently on an ACEI or ARB for HF.  This note is being placed in the record in order to provide documentation that a contraindication to the use of these agents is present for this encounter.  ACE Inhibitor or Angiotensin Receptor Blocker is contraindicated (specify all that apply)  []   ACEI allergy AND ARB allergy []   Angioedema []   Moderate or severe aortic stenosis []   Hyperkalemia []   Hypotension []   Renal artery stenosis [x]   Worsening renal function, preexisting renal disease or dysfunction   [name] 04/08/2017 2:43 PM

## 2017-04-08 NOTE — Progress Notes (Signed)
[name] is scheduled for a direct current cardioversion tomorrow morning at 11AM with Dr. Acie Fredrickson, Orders have been entered. NPO at [facility], please.  Nursing to arrange [facility] transport for tomorrow.   Ledora Bottcher, PA-C 04/08/2017, 12:55 PM [address] [place], [place] [address]

## 2017-04-09 ENCOUNTER — Inpatient Hospital Stay (HOSPITAL_COMMUNITY): Payer: Medicare Other | Admitting: Certified Registered"

## 2017-04-09 ENCOUNTER — Encounter (HOSPITAL_COMMUNITY): Payer: Self-pay | Admitting: *Deleted

## 2017-04-09 ENCOUNTER — Encounter (HOSPITAL_COMMUNITY)
Admission: EM | Disposition: A | Discharge status: Home Health | Payer: Self-pay | Source: Home / Self Care | Attending: Internal Medicine

## 2017-04-09 HISTORY — PX: CARDIOVERSION: SHX1299

## 2017-04-09 SURGERY — CARDIOVERSION
Anesthesia: Monitor Anesthesia Care

## 2017-04-09 MED ORDER — APIXABAN 2.5 MG PO TABS
2.5000 mg | ORAL_TABLET | Freq: Once | ORAL | Status: AC
  Administered 2017-04-09: 2.5 mg via ORAL
  Filled 2017-04-09: qty 1

## 2017-04-09 MED ORDER — ETOMIDATE 2 MG/ML IV SOLN
INTRAVENOUS | Status: DC | PRN
  Administered 2017-04-09: 12 mg via INTRAVENOUS

## 2017-04-09 MED ORDER — AMIODARONE HCL 200 MG PO TABS
200.0000 mg | ORAL_TABLET | Freq: Two times a day (BID) | ORAL | Status: DC
  Administered 2017-04-09 – 2017-04-14 (×11): 200 mg via ORAL
  Filled 2017-04-09 (×11): qty 1

## 2017-04-09 NOTE — Progress Notes (Signed)
PT Cancellation Note  Patient Details Name: [name] [name] MRN: [id number] DOB: 03/15/1920   Cancelled Treatment:    Reason Eval/Treat Not Completed: Other (comment) (Plan for cardioversion today at [facility] with Dr. Acie Fredrickson)   [name] 04/09/2017, 8:46 AM Carmelia Bake, PT, DPT 04/09/2017 Pager: [phone]

## 2017-04-09 NOTE — Transfer of Care (Signed)
Immediate Anesthesia Transfer of Care Note  Patient: [name] [name]  Procedure(s) Performed: Procedure(s): CARDIOVERSION (N/A)  Patient Location: Endoscopy Unit  Anesthesia Type:General  Level of Consciousness: lethargic and responds to stimulation  Airway & Oxygen Therapy: Patient Spontanous Breathing and Patient connected to nasal cannula oxygen  Post-op Assessment: Report given to RN  Post vital signs: Reviewed and stable  Last Vitals:  Vitals:   04/09/17 1108 04/09/17 1109  BP:    Pulse: (!) 29 (!) 31  Resp: 20 20  Temp:      Last Pain:  Vitals:   04/09/17 1107  TempSrc: Oral  PainSc:       Patients Stated Pain Goal: 0 (37/85/88 5027)  Complications: No apparent anesthesia complications

## 2017-04-09 NOTE — Interval H&P Note (Signed)
History and Physical Interval Note:  04/09/2017 5:37 AM  [name] [name]  has presented today for surgery, with the diagnosis of AFIB  The various methods of treatment have been discussed with the patient and family. After consideration of risks, benefits and other options for treatment, the patient has consented to  Procedure(s): CARDIOVERSION (N/A) as a surgical intervention .  The patient's history has been reviewed, patient examined, no change in status, stable for surgery.  I have reviewed the patient's chart and labs.  Questions were answered to the patient's satisfaction.     [name]

## 2017-04-09 NOTE — CV Procedure (Signed)
    Cardioversion Note  [name] [name] [id number] 1920-02-22  Procedure: DC Cardioversion Indications: Atrial fib   Procedure Details Consent: Obtained Time Out: Verified patient identification, verified procedure, site/side was marked, verified correct patient position, special equipment/implants available, Radiology Safety Procedures followed,  medications/allergies/relevent history reviewed, required imaging and test results available.  Performed  The patient has been on adequate anticoagulation.  The patient received IV Etomidate 12 mg IV  for sedation.  Synchronous cardioversion was performed at 120  joules.  The cardioversion was successful.  He has frequent PVCs and PACs following cardioversion    Complications: No apparent complications Patient did tolerate procedure well.   Thayer Headings, Brooke Bonito., MD, [facility] 04/09/2017, 11:11 AM
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
## 2017-04-09 NOTE — Anesthesia Postprocedure Evaluation (Signed)
Anesthesia Post Note  Patient: [name] [name]  Procedure(s) Performed: Procedure(s) (LRB): CARDIOVERSION (N/A)  Patient location during evaluation: Endoscopy Anesthesia Type: MAC Level of consciousness: awake and alert, oriented and patient cooperative Pain management: pain level controlled Vital Signs Assessment: post-procedure vital signs reviewed and stable Respiratory status: spontaneous breathing, nonlabored ventilation, respiratory function stable and patient connected to nasal cannula oxygen Cardiovascular status: blood pressure returned to baseline and stable Postop Assessment: no signs of nausea or vomiting Anesthetic complications: no       Last Vitals:  Vitals:   04/09/17 1110 04/09/17 1120  BP: (!) 102/41 105/66  Pulse: (!) 50 (!) 33  Resp: 20 17  Temp:      Last Pain:  Vitals:   04/09/17 1107  TempSrc: Oral  PainSc:                  Abigayl Hor,E. Abbegale Stehle
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
## 2017-04-10 ENCOUNTER — Encounter (HOSPITAL_COMMUNITY): Payer: Self-pay | Admitting: Cardiovascular Disease

## 2017-04-10 LAB — CBC
HEMATOCRIT: 43.7 % (ref 39.0–52.0)
HEMOGLOBIN: 14.7 g/dL (ref 13.0–17.0)
MCH: 30.7 pg (ref 26.0–34.0)
MCHC: 33.6 g/dL (ref 30.0–36.0)
MCV: 91.2 fL (ref 78.0–100.0)
Platelets: 126 10*3/uL — ABNORMAL LOW (ref 150–400)
RBC: 4.79 MIL/uL (ref 4.22–5.81)
RDW: 14.5 % (ref 11.5–15.5)
WBC: 7.5 10*3/uL (ref 4.0–10.5)

## 2017-04-10 LAB — BASIC METABOLIC PANEL
ANION GAP: 9 (ref 5–15)
BUN: 42 mg/dL — ABNORMAL HIGH (ref 6–20)
CHLORIDE: 98 mmol/L — AB (ref 101–111)
CO2: 27 mmol/L (ref 22–32)
Calcium: 8.2 mg/dL — ABNORMAL LOW (ref 8.9–10.3)
Creatinine, Ser: 1.68 mg/dL — ABNORMAL HIGH (ref 0.61–1.24)
GFR calc non Af Amer: 33 mL/min — ABNORMAL LOW (ref >60)
GFR, EST AFRICAN AMERICAN: 38 mL/min — AB (ref >60)
Glucose, Bld: 98 mg/dL (ref 65–99)
POTASSIUM: 4.4 mmol/L (ref 3.5–5.1)
Sodium: 134 mmol/L — ABNORMAL LOW (ref 135–145)
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
## 2017-04-10 NOTE — Progress Notes (Signed)
[id number] Rosana Hoes, BSN, RN3,[phone]: Chart reviewed for updates and status. 2d echo shows mitral valve, and aortic regurgitation, rt and left atriums severely dilated, CM following for discharge needs. Cm following for progression of hospital stay:

## 2017-04-11 LAB — CBC
HEMATOCRIT: 41.8 % (ref 39.0–52.0)
HEMOGLOBIN: 14.2 g/dL (ref 13.0–17.0)
MCH: 30.8 pg (ref 26.0–34.0)
MCHC: 34 g/dL (ref 30.0–36.0)
MCV: 90.7 fL (ref 78.0–100.0)
Platelets: 119 10*3/uL — ABNORMAL LOW (ref 150–400)
RBC: 4.61 MIL/uL (ref 4.22–5.81)
RDW: 14.6 % (ref 11.5–15.5)
WBC: 5.9 10*3/uL (ref 4.0–10.5)

## 2017-04-11 LAB — BASIC METABOLIC PANEL
ANION GAP: 10 (ref 5–15)
BUN: 44 mg/dL — ABNORMAL HIGH (ref 6–20)
CHLORIDE: 99 mmol/L — AB (ref 101–111)
CO2: 29 mmol/L (ref 22–32)
Calcium: 8.3 mg/dL — ABNORMAL LOW (ref 8.9–10.3)
Creatinine, Ser: 1.67 mg/dL — ABNORMAL HIGH (ref 0.61–1.24)
GFR, EST AFRICAN AMERICAN: 38 mL/min — AB (ref >60)
GFR, EST NON AFRICAN AMERICAN: 33 mL/min — AB (ref >60)
Glucose, Bld: 103 mg/dL — ABNORMAL HIGH (ref 65–99)
POTASSIUM: 3.9 mmol/L (ref 3.5–5.1)
Sodium: 138 mmol/L (ref 135–145)
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
## 2017-04-11 NOTE — Care Management Important Message (Signed)
Important Message  Patient Details  Name: Caldwell Kronenberger MRN: [id number] Date of Birth: [birth year]   Medicare Important Message Given:  Yes    Kerin Salen 04/11/2017, 12:00 [place] Message  Patient Details  Name: Woodrow Drab MRN: [id number] Date of Birth: 1920-04-20   Medicare Important Message Given:  Yes    Kerin Salen 04/11/2017, 12:00 PM

## 2017-04-11 NOTE — Progress Notes (Signed)
CSW met with pt to assist with d/c planning. PT has recommended SNF at d/c. Pt reports that he would prefer to return home. Pt did give CSW permission to initiate SNF search in case he changed his mind regarding placement. Pt has [place] which will require prior auth which cannot be started until pt agrees with SNF placement. CSW will meet again this afternoon with pt to review SNF bed offers and to see if he has reconsidered SNF placement.  Werner Lean LCSW [phone]

## 2017-04-11 NOTE — NC FL2 (Signed)
[place] LEVEL OF CARE SCREENING TOOL     IDENTIFICATION  Patient Name: [name] [name] Birthdate: [birth year] Sex: male Admission Date (Current Location): 04/06/2017  [facility] and [place] Number:  Herbalist and Address:  [facility],  [place] [address] [place]      Provider Number: [id number]  Attending Physician Name and Address:  Theodis Blaze, MD  Relative Name and Phone Number:       Current Level of Care: Hospital Recommended Level of Care: [place] Prior Approval Number:    Date Approved/Denied:   PASRR Number:    Discharge Plan: SNF    Current Diagnoses: Patient Active Problem List   Diagnosis Date Noted  . Atrial fibrillation with RVR ([place]) 04/06/2017  . Closed rib fracture 04/06/2017  . Pneumothorax 04/06/2017  . Atrial bigeminy 08/22/2016  . Bradycardia 08/22/2016  . Knee pain, acute 07/17/2016  . Traumatic hematoma of right upper arm 06/07/2016  . Laceration of head 06/07/2016  . PAF (paroxysmal atrial fibrillation) ([place])   . Demand ischemia ([place]) 06/01/2016  . Systolic CHF, chronic ([place]) 06/01/2016  . Faintness   . Congestive dilated cardiomyopathy ([place])   . Abrasion, multiple sites   . Atypical atrial flutter ([place])   . Fall 05/28/2016  . Scalp laceration 05/28/2016  . Contusion of arm, right 05/28/2016  . Syncope and collapse 05/28/2016  . History of sinus bradycardia 05/28/2016  . Contusion of right forearm 05/28/2016  . CKD (chronic kidney disease) stage 3, GFR 30-59 ml/min 05/28/2016  . Acute hyperkalemia 05/28/2016  . Thrombocytopenia (CHRONIC) 05/28/2016  . Contusion of head   . Elevated troponin   . Constipation 10/10/2015  . Angular stomatitis 11/21/2014  . Erectile dysfunction 11/21/2014  . Rash and nonspecific skin eruption 07/01/2014  . DOE (dyspnea on exertion) 03/08/2014  . Laceration of right hand 12/21/2013  . Traumatic hematoma of forehead 12/21/2013  . Fall at home  12/21/2013  . Eczema 11/02/2013  . Paresthesia 03/29/2013  . Pain in joint, shoulder region 12/28/2012  . Neoplasm of uncertain behavior of skin 11/28/2011  . Actinic keratoses 11/28/2011  . Osteoarthritis 11/28/2011  . Well adult exam 11/28/2011  . Hemorrhoids, internal, with bleeding s/p banding 11/14/2011  . HYPERKERATOSIS 12/26/2009  . Essential hypertension 06/02/2009  . Malignant neoplasm of skin of parts of face 04/12/2008  . Insomnia 04/12/2008  . Dyslipidemia 09/29/2007  . ANXIETY DEPRESSION 09/29/2007  . Coronary atherosclerosis 09/29/2007  . BPH (benign prostatic hyperplasia) 09/29/2007    Orientation RESPIRATION BLADDER Height & Weight     Self, Time, Situation, Place  Normal Continent Weight: 193 lb 2 oz (87.6 kg) Height:  5\' 7"  (170.2 cm)  BEHAVIORAL SYMPTOMS/MOOD NEUROLOGICAL BOWEL NUTRITION STATUS  Other (Comment) (no behaviors)   Continent Diet  AMBULATORY STATUS COMMUNICATION OF NEEDS Skin   Limited Assist Verbally Normal                       Personal Care Assistance Level of Assistance  Bathing, Feeding, Dressing Bathing Assistance: Limited assistance Feeding assistance: Independent Dressing Assistance: Limited assistance     Functional Limitations Info  Sight, Hearing, Speech Sight Info: Impaired Hearing Info: Impaired Speech Info: Adequate    SPECIAL CARE FACTORS FREQUENCY  PT (By licensed PT), OT (By licensed OT)     PT Frequency: 5x wk OT Frequency: 5x wk            Contractures Contractures Info: Not  present    Additional Factors Info  Code Status Code Status Info: Full Code             Current Medications (04/11/2017):  This is the current hospital active medication list Current Facility-Administered Medications  Medication Dose Route Frequency Provider Last Rate Last Dose  . 0.9 %  sodium chloride infusion  250 mL Intravenous PRN Ghimire, Shanker M, MD      . 0.9 %  sodium chloride infusion  250 mL Intravenous  Continuous Duke, Tami Lin, PA      . 0.9 %  sodium chloride infusion   Intravenous Continuous Ledora Bottcher, PA 10 mL/hr at 04/10/17 0100    . acetaminophen (TYLENOL) tablet 1,000 mg  1,000 mg Oral Q8H Ghimire, Henreitta Leber, MD   1,000 mg at 04/11/17 0703  . albuterol (PROVENTIL) (2.5 MG/3ML) 0.083% nebulizer solution 2.5 mg  2.5 mg Nebulization Q2H PRN Ghimire, Henreitta Leber, MD      . ALPRAZolam Duanne Moron) tablet 1 mg  1 mg Oral QHS Ghimire, Henreitta Leber, MD   1 mg at 04/10/17 2214  . amiodarone (PACERONE) tablet 200 mg  200 mg Oral BID Ledora Bottcher, PA   200 mg at 04/10/17 2215  . apixaban (ELIQUIS) tablet 2.5 mg  2.5 mg Oral BID Lelon Perla, MD   2.5 mg at 04/10/17 2214  . docusate sodium (COLACE) capsule 100 mg  100 mg Oral BID Isaac Bliss, Rayford Halsted, MD   100 mg at 04/10/17 2215  . finasteride (PROSCAR) tablet 5 mg  5 mg Oral Daily Jonetta Osgood, MD   5 mg at 04/10/17 7062  . furosemide (LASIX) tablet 40 mg  40 mg Oral Daily Jonetta Osgood, MD   40 mg at 04/10/17 0920  . guaiFENesin-dextromethorphan (ROBITUSSIN DM) 100-10 MG/5ML syrup 5 mL  5 mL Oral Q4H PRN Isaac Bliss, Rayford Halsted, MD   5 mL at 04/10/17 2214  . multivitamin with minerals tablet 1 tablet  1 tablet Oral Daily Jonetta Osgood, MD   1 tablet at 04/10/17 0920  . ondansetron (ZOFRAN) tablet 4 mg  4 mg Oral Q6H PRN Ghimire, Henreitta Leber, MD       Or  . ondansetron (ZOFRAN) injection 4 mg  4 mg Intravenous Q6H PRN Ghimire, Henreitta Leber, MD      . polyethylene glycol (MIRALAX / GLYCOLAX) packet 17 g  17 g Oral Daily Isaac Bliss, Rayford Halsted, MD   17 g at 04/10/17 0920  . senna-docusate (Senokot-S) tablet 1 tablet  1 tablet Oral QHS PRN Jonetta Osgood, MD   1 tablet at 04/07/17 2109  . simvastatin (ZOCOR) tablet 20 mg  20 mg Oral q1800 Jonetta Osgood, MD   20 mg at 04/10/17 1646  . sodium chloride flush (NS) 0.9 % injection 3 mL  3 mL Intravenous Q12H Ghimire, Henreitta Leber, MD   3 mL at 04/08/17 2200  .  sodium chloride flush (NS) 0.9 % injection 3 mL  3 mL Intravenous Q12H Jonetta Osgood, MD   3 mL at 04/08/17 0919  . sodium chloride flush (NS) 0.9 % injection 3 mL  3 mL Intravenous PRN Ghimire, Henreitta Leber, MD      . sodium chloride flush (NS) 0.9 % injection 3 mL  3 mL Intravenous Q12H Ledora Bottcher, PA   3 mL at 04/10/17 2215  . sodium chloride flush (NS) 0.9 % injection 3 mL  3 mL Intravenous PRN Duke,  Tami Lin, PA      . traMADol Veatrice Bourbon) tablet 50 mg  50 mg Oral Q6H PRN Jonetta Osgood, MD   50 mg at 04/07/17 2228     Discharge Medications: Please see discharge summary for a list of discharge medications.  Relevant Imaging Results:  Relevant Lab Results:   Additional Information SS # [id number]  Gardy Montanari, Randall An, LCSW
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
## 2017-04-12 LAB — BASIC METABOLIC PANEL
Anion gap: 8 (ref 5–15)
BUN: 42 mg/dL — AB (ref 6–20)
CHLORIDE: 102 mmol/L (ref 101–111)
CO2: 30 mmol/L (ref 22–32)
CREATININE: 1.6 mg/dL — AB (ref 0.61–1.24)
Calcium: 8.4 mg/dL — ABNORMAL LOW (ref 8.9–10.3)
GFR calc Af Amer: 41 mL/min — ABNORMAL LOW (ref >60)
GFR calc non Af Amer: 35 mL/min — ABNORMAL LOW (ref >60)
GLUCOSE: 103 mg/dL — AB (ref 65–99)
POTASSIUM: 4.1 mmol/L (ref 3.5–5.1)
SODIUM: 140 mmol/L (ref 135–145)

## 2017-04-12 LAB — CBC
HEMATOCRIT: 41.7 % (ref 39.0–52.0)
HEMOGLOBIN: 14 g/dL (ref 13.0–17.0)
MCH: 30.6 pg (ref 26.0–34.0)
MCHC: 33.6 g/dL (ref 30.0–36.0)
MCV: 91.2 fL (ref 78.0–100.0)
Platelets: 129 10*3/uL — ABNORMAL LOW (ref 150–400)
RBC: 4.57 MIL/uL (ref 4.22–5.81)
RDW: 14.7 % (ref 11.5–15.5)
WBC: 5.9 10*3/uL (ref 4.0–10.5)
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
## 2017-04-13 LAB — CBC
HCT: 42.8 % (ref 39.0–52.0)
Hemoglobin: 14 g/dL (ref 13.0–17.0)
MCH: 29.9 pg (ref 26.0–34.0)
MCHC: 32.7 g/dL (ref 30.0–36.0)
MCV: 91.5 fL (ref 78.0–100.0)
PLATELETS: 138 10*3/uL — AB (ref 150–400)
RBC: 4.68 MIL/uL (ref 4.22–5.81)
RDW: 14.6 % (ref 11.5–15.5)
WBC: 5.3 10*3/uL (ref 4.0–10.5)

## 2017-04-13 LAB — BASIC METABOLIC PANEL
Anion gap: 7 (ref 5–15)
BUN: 38 mg/dL — AB (ref 6–20)
CALCIUM: 8.3 mg/dL — AB (ref 8.9–10.3)
CO2: 30 mmol/L (ref 22–32)
CREATININE: 1.68 mg/dL — AB (ref 0.61–1.24)
Chloride: 102 mmol/L (ref 101–111)
GFR calc Af Amer: 38 mL/min — ABNORMAL LOW (ref >60)
GFR, EST NON AFRICAN AMERICAN: 33 mL/min — AB (ref >60)
Glucose, Bld: 110 mg/dL — ABNORMAL HIGH (ref 65–99)
Potassium: 3.8 mmol/L (ref 3.5–5.1)
SODIUM: 139 mmol/L (ref 135–145)

## 2017-04-13 MED ORDER — ALUM & MAG HYDROXIDE-SIMETH 200-200-20 MG/5ML PO SUSP: 30.0000 mL | ORAL | Status: DC | PRN
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
## 2017-04-13 NOTE — Progress Notes (Signed)
LCSW covering for weekend:  Disposition recommendation: SNF  Patient at this time is walking 129ft and insurance will not cover SNF rehab. Patient's passar has become available  [phone] [id number]   Per notes, patient not wanting to go to SNF. If patient changes his mind, he will need to pay privately.  Please re-consult if needed. Will sign off for now.  Lane Hacker, MSW Clinical Social Work: [employer] Coverage for :  [phone]

## 2017-04-14 DIAGNOSIS — N183 Chronic kidney disease, stage 3 (moderate): Secondary | ICD-10-CM

## 2017-04-14 DIAGNOSIS — R0602 Shortness of breath: Secondary | ICD-10-CM

## 2017-04-14 DIAGNOSIS — S2231XA Fracture of one rib, right side, initial encounter for closed fracture: Secondary | ICD-10-CM

## 2017-04-14 MED ORDER — POLYETHYLENE GLYCOL 3350 17 G PO PACK: 17.0000 g | PACK | Freq: Every day | ORAL | 0 refills | Status: DC

## 2017-04-14 MED ORDER — SENNOSIDES-DOCUSATE SODIUM 8.6-50 MG PO TABS: 1.0000 | ORAL_TABLET | Freq: Every evening | ORAL | 0 refills | Status: DC | PRN

## 2017-04-14 MED ORDER — GUAIFENESIN-DM 100-10 MG/5ML PO SYRP: 5.0000 mL | ORAL_SOLUTION | ORAL | 0 refills | Status: DC | PRN

## 2017-04-14 MED ORDER — TRAMADOL HCL 50 MG PO TABS: 50.0000 mg | ORAL_TABLET | Freq: Four times a day (QID) | ORAL | 0 refills | Status: DC | PRN

## 2017-04-14 MED ORDER — FUROSEMIDE 40 MG PO TABS: 40.0000 mg | ORAL_TABLET | Freq: Every day | ORAL | 0 refills | Status: DC

## 2017-04-14 MED ORDER — AMIODARONE HCL 200 MG PO TABS: 200.0000 mg | ORAL_TABLET | Freq: Two times a day (BID) | ORAL | 0 refills | Status: DC

## 2017-04-14 NOTE — Care Management Note (Signed)
Case Management Note  Patient Details  Name: [name] [name] MRN: [id number] Date of Birth: 06-12-1920  Subjective/Objective:  83 y/o m admitted w/Afib. From home alone, has rw. Patient states son, & friend will assist @ home periodically.PT-upon re-eval recc HHPT. Also recc-HHRN,HHOT,aide,social worker-resources.[facility] chosen for [employer] aware & following for orders,& d/c.                  Action/Plan:d/c home w/HHC.   Expected Discharge Date:                  Expected Discharge Plan:  [place]  In-House Referral:     Discharge planning Services  CM Consult  Post Acute Care Choice:  Durable Medical Equipment (Has rw) Choice offered to:     DME Arranged:    DME Agency:     HH Arranged:  PT, RN, OT, Nurse's Aide, Social Work [employer] Agency:  [facility] [place]  Status of Service:  Completed, signed off  If discussed at [employer], dates discussed:    Additional Comments:  Dessa Phi, RN 04/14/2017, 10:39 AM

## 2017-04-14 NOTE — Progress Notes (Signed)
Patient is a&ox4, ambulatory with walker. Discharge instructions reviewed, questions, concerns denied
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
## 2017-04-15 ENCOUNTER — Telehealth: Payer: Self-pay | Admitting: *Deleted

## 2017-04-15 NOTE — Telephone Encounter (Signed)
Transition Care Management Follow-up Telephone Call   Date discharged? 04/14/17   How have you been since you were released from the hospital? Pt states he is doing alright   Do you understand why you were in the hospital? YES   Do you understand the discharge instructions? YES   Where were you discharged to? Home   Items Reviewed:  Medications reviewed: YES  Allergies reviewed: YES  Dietary changes reviewed YES, heart healthy  Referrals reviewed: No referral needed   Functional Questionnaire:   Activities of Daily Living (ADLs):   He states he are independent in the following: bathing and hygiene, feeding, continence, grooming, toileting and dressing States he require assistance with the following: ambulation   Any transportation issues/concerns?: NO   Any patient concerns? NO   Confirmed importance and date/time of follow-up visits scheduled YES, appt 04/24/17  Provider Appointment booked with Dr. Alain Marion  Confirmed with patient if condition begins to worsen call PCP or go to the ER.  Patient was given the office number and encouraged to call back with question or concerns.  : YES

## 2017-04-16 DIAGNOSIS — I130 Hypertensive heart and chronic kidney disease with heart failure and stage 1 through stage 4 chronic kidney disease, or unspecified chronic kidney disease: Secondary | ICD-10-CM | POA: Diagnosis not present

## 2017-04-16 DIAGNOSIS — I5022 Chronic systolic (congestive) heart failure: Secondary | ICD-10-CM | POA: Diagnosis not present

## 2017-04-16 DIAGNOSIS — E785 Hyperlipidemia, unspecified: Secondary | ICD-10-CM | POA: Diagnosis not present

## 2017-04-16 DIAGNOSIS — S40921D Unspecified superficial injury of right upper arm, subsequent encounter: Secondary | ICD-10-CM | POA: Diagnosis not present

## 2017-04-16 DIAGNOSIS — F419 Anxiety disorder, unspecified: Secondary | ICD-10-CM | POA: Diagnosis not present

## 2017-04-16 DIAGNOSIS — S2241XD Multiple fractures of ribs, right side, subsequent encounter for fracture with routine healing: Secondary | ICD-10-CM | POA: Diagnosis not present

## 2017-04-16 DIAGNOSIS — I4891 Unspecified atrial fibrillation: Secondary | ICD-10-CM | POA: Diagnosis not present

## 2017-04-16 DIAGNOSIS — F329 Major depressive disorder, single episode, unspecified: Secondary | ICD-10-CM | POA: Diagnosis not present

## 2017-04-16 DIAGNOSIS — N183 Chronic kidney disease, stage 3 (moderate): Secondary | ICD-10-CM | POA: Diagnosis not present

## 2017-04-16 DIAGNOSIS — D696 Thrombocytopenia, unspecified: Secondary | ICD-10-CM | POA: Diagnosis not present

## 2017-04-22 ENCOUNTER — Telehealth: Payer: Self-pay | Admitting: Internal Medicine

## 2017-04-22 NOTE — Telephone Encounter (Signed)
FYI, verbals given 

## 2017-04-22 NOTE — Telephone Encounter (Signed)
PT called..  Patient is a fall risk. Patient had plus +2 ankles and feet Patient reports reduce feeling knees to feet  Would like PT orders for 2x2 1x1 For fall risk  [phone] - Jeneen Rinks

## 2017-04-22 NOTE — Telephone Encounter (Signed)
Ok thx.

## 2017-04-24 ENCOUNTER — Other Ambulatory Visit (INDEPENDENT_AMBULATORY_CARE_PROVIDER_SITE_OTHER): Payer: Medicare Other

## 2017-04-24 ENCOUNTER — Ambulatory Visit (INDEPENDENT_AMBULATORY_CARE_PROVIDER_SITE_OTHER)
Admission: RE | Admit: 2017-04-24 | Discharge: 2017-04-24 | Disposition: A | Discharge status: Home / Self Care | Payer: Medicare Other | Source: Ambulatory Visit | Attending: Internal Medicine | Admitting: Internal Medicine

## 2017-04-24 ENCOUNTER — Encounter: Payer: Self-pay | Admitting: Internal Medicine

## 2017-04-24 ENCOUNTER — Ambulatory Visit (INDEPENDENT_AMBULATORY_CARE_PROVIDER_SITE_OTHER): Payer: Medicare Other | Admitting: Internal Medicine

## 2017-04-24 VITALS — BP 106/60 | HR 56 | Ht 68.0 in | Wt 190.0 lb

## 2017-04-24 DIAGNOSIS — I480 Paroxysmal atrial fibrillation: Secondary | ICD-10-CM

## 2017-04-24 DIAGNOSIS — I4891 Unspecified atrial fibrillation: Secondary | ICD-10-CM

## 2017-04-24 DIAGNOSIS — S270XXD Traumatic pneumothorax, subsequent encounter: Secondary | ICD-10-CM | POA: Diagnosis not present

## 2017-04-24 DIAGNOSIS — K5900 Constipation, unspecified: Secondary | ICD-10-CM | POA: Diagnosis not present

## 2017-04-24 DIAGNOSIS — E785 Hyperlipidemia, unspecified: Secondary | ICD-10-CM

## 2017-04-24 DIAGNOSIS — J90 Pleural effusion, not elsewhere classified: Secondary | ICD-10-CM | POA: Diagnosis not present

## 2017-04-24 LAB — BASIC METABOLIC PANEL
BUN: 27 mg/dL — ABNORMAL HIGH (ref 6–23)
CHLORIDE: 105 meq/L (ref 96–112)
CO2: 26 mEq/L (ref 19–32)
Calcium: 9.1 mg/dL (ref 8.4–10.5)
Creatinine, Ser: 1.51 mg/dL — ABNORMAL HIGH (ref 0.40–1.50)
GFR: 45.84 mL/min — AB (ref >60.00)
Glucose, Bld: 132 mg/dL — ABNORMAL HIGH (ref 70–99)
POTASSIUM: 3.9 meq/L (ref 3.5–5.1)
SODIUM: 140 meq/L (ref 135–145)

## 2017-04-24 MED ORDER — FUROSEMIDE 40 MG PO TABS
60.0000 mg | ORAL_TABLET | Freq: Every day | ORAL | 5 refills | Status: DC
Start: 2017-04-24 — End: 2017-08-06

## 2017-04-24 MED ORDER — PRAVASTATIN SODIUM 20 MG PO TABS: 20.0000 mg | ORAL_TABLET | Freq: Every day | ORAL | 11 refills | Status: DC

## 2017-04-24 MED ORDER — LINACLOTIDE 145 MCG PO CAPS: 145.0000 ug | ORAL_CAPSULE | Freq: Every day | ORAL | 2 refills | Status: DC
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
## 2017-04-24 NOTE — Assessment & Plan Note (Signed)
Patient underwent DCCV Cardioversion on 04/09/17 On Amiodarone

## 2017-04-24 NOTE — Patient Instructions (Signed)
Stop Simvastatin, start Pravastatin
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
## 2017-04-24 NOTE — Assessment & Plan Note (Signed)
  On Amiodarone. Switch from Simvastatin to Pravachol 20

## 2017-04-24 NOTE — Assessment & Plan Note (Signed)
R - clinically doing well CXR

## 2017-04-24 NOTE — Patient Instructions (Addendum)
Discontinue Senna and Milk of Magnesia.  We have sent the following medications to your pharmacy for you to pick up at your convenience: Linzess 145 mcg daily 30 minutes before breakfast.  Call our office in 1 week if your constipation has not considerably improved.  Please follow up with Dr Hilarie Fredrickson on Monday, 06/30/17 at 9:30 am.  If you are age 81 or older, your body mass index should be between 23-30. Your Body mass index is 28.89 kg/m. If this is out of the aforementioned range listed, please consider follow up with your Primary Care Provider.  If you are age 62 or younger, your body mass index should be between 19-25. Your Body mass index is 28.89 kg/m. If this is out of the aformentioned range listed, please consider follow up with your Primary Care Provider.
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
## 2017-04-24 NOTE — Assessment & Plan Note (Signed)
On Eliquis
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
## 2017-05-01 ENCOUNTER — Encounter: Payer: Self-pay | Admitting: Cardiology

## 2017-05-01 ENCOUNTER — Ambulatory Visit (INDEPENDENT_AMBULATORY_CARE_PROVIDER_SITE_OTHER): Payer: Medicare Other | Admitting: Cardiology

## 2017-05-01 VITALS — BP 132/60 | HR 57 | Ht 68.0 in | Wt 192.0 lb

## 2017-05-01 DIAGNOSIS — N183 Chronic kidney disease, stage 3 unspecified: Secondary | ICD-10-CM

## 2017-05-01 DIAGNOSIS — I5022 Chronic systolic (congestive) heart failure: Secondary | ICD-10-CM

## 2017-05-01 DIAGNOSIS — I480 Paroxysmal atrial fibrillation: Secondary | ICD-10-CM | POA: Diagnosis not present

## 2017-05-01 DIAGNOSIS — M7989 Other specified soft tissue disorders: Secondary | ICD-10-CM

## 2017-05-01 NOTE — Patient Instructions (Signed)
Medication Instructions:  Continue current medications  Labwork: None Ordered  Testing/Procedures: None Ordered  Follow-Up: Your physician recommends that you schedule a follow-up appointment in: 4 Months   Any Other Special Instructions Will Be Listed Below (If Applicable).   If you need a refill on your cardiac medications before your next appointment, please call your pharmacy.   

## 2017-05-02 ENCOUNTER — Telehealth: Payer: Self-pay | Admitting: Cardiology

## 2017-05-02 ENCOUNTER — Other Ambulatory Visit: Payer: Self-pay | Admitting: Cardiology

## 2017-05-02 ENCOUNTER — Telehealth: Payer: Self-pay | Admitting: *Deleted

## 2017-05-02 ENCOUNTER — Other Ambulatory Visit: Payer: Self-pay | Admitting: Internal Medicine

## 2017-05-02 NOTE — Telephone Encounter (Signed)
Call and spoke with pt letting him know he need to reduce Amiodarone 200 mg daily. Pt voice understanding

## 2017-05-02 NOTE — Telephone Encounter (Signed)
Spoke with [place] physical therapist he states that he spoke with pt about his [facility] PT orders and pt told him that no one here in our office or at the hospital told him that he has CHF and he would like someone to call and explain to him.  Upon review of chart pt had echo in the hospital and EF is 25%-30%. PT will help this  Tried to call pt x3 number is busy

## 2017-05-02 NOTE — Telephone Encounter (Signed)
New message   Clair Gulling is a PT for [facility] and he wants a confirmation on the pr CHF diagnoses   He said that the patient was unaware

## 2017-05-02 NOTE — Telephone Encounter (Signed)
-----   Message from Minus Breeding, MD sent at 05/01/2017 12:53 PM EDT ----- Reduce amiodarone to 200 mg once daily please.

## 2017-05-05 ENCOUNTER — Telehealth: Payer: Self-pay | Admitting: Internal Medicine

## 2017-05-05 NOTE — Telephone Encounter (Signed)
Refill called into pt [facility] # [phone] 2 refills

## 2017-05-05 NOTE — Telephone Encounter (Signed)
[name] [name] from [facility] called to let Dr Alain Marion know that he is having to cancel the visit with the pt this week but would like to make it up next week if that is okay. Please advise.

## 2017-05-06 NOTE — Telephone Encounter (Signed)
LM notifying Clair Gulling it is okay to make up appt

## 2017-05-07 ENCOUNTER — Other Ambulatory Visit: Payer: Self-pay | Admitting: Internal Medicine

## 2017-05-07 NOTE — Telephone Encounter (Signed)
Dr. Ronnald Ramp approved refill he printed rx out. [place] spoke w/Angie to verify if rx could be called in or fax. Per angie she can take approval over phone. Gave MD approval for promethazine-codeine cough syrup...Johny Chess

## 2017-05-07 NOTE — Telephone Encounter (Signed)
Spoke with pt and PT jim, questions regarding heart failure diagnosis answered.

## 2017-05-07 NOTE — Telephone Encounter (Signed)
MD out of office pls advise on refill.../lmb 

## 2017-05-08 ENCOUNTER — Telehealth: Payer: Self-pay | Admitting: Cardiology

## 2017-05-08 NOTE — Telephone Encounter (Signed)
Line busy at 2:30 pm and currently

## 2017-05-08 NOTE — Telephone Encounter (Signed)
New message    Pt is calling asking for a call back about his medication.

## 2017-05-09 MED ORDER — AMIODARONE HCL 200 MG PO TABS: 200.0000 mg | ORAL_TABLET | Freq: Every day | ORAL | 5 refills | Status: DC

## 2017-05-09 NOTE — Telephone Encounter (Signed)
S/w pt he states that he has been having increased urination from lasix, he states that he is in the bathroom all day. Re-assured pt that this is normal for lasix and to make sure to take this medication in the am just to make sure he is not disturbed while sleeping.

## 2017-05-14 ENCOUNTER — Telehealth: Payer: Self-pay | Admitting: Internal Medicine

## 2017-05-14 NOTE — Telephone Encounter (Signed)
Attempted to call pt but got busy signal.

## 2017-05-14 NOTE — Telephone Encounter (Signed)
Pt states he is taking the linzess 145 daily but it is not working as well as he would like. States he is having a BM about every 2 days. He has not been taking MOM or Senna as Dr. Hilarie Fredrickson told him to stay away from those. Please advise.

## 2017-05-15 MED ORDER — LINACLOTIDE 290 MCG PO CAPS: 290.0000 ug | ORAL_CAPSULE | Freq: Every day | ORAL | 3 refills | Status: DC

## 2017-05-15 NOTE — Telephone Encounter (Signed)
Increase the Linzess to 290 g daily After about 1-2 weeks have him let me know how it is working

## 2017-05-15 NOTE — Telephone Encounter (Signed)
Spoke with pt and he is aware. Script sent to the pharmacy.

## 2017-05-22 ENCOUNTER — Telehealth: Payer: Self-pay | Admitting: Internal Medicine

## 2017-05-22 ENCOUNTER — Telehealth: Payer: Self-pay | Admitting: Cardiology

## 2017-05-22 NOTE — Telephone Encounter (Signed)
Spoke to [employer] with [facility]: she states that pt had been taking Amiodarone 200mg  BID he must have not received the message to decrease dose change to once daily. She states that she will decrease and continue to call if HR <50 BPM

## 2017-05-22 NOTE — Telephone Encounter (Signed)
If he is having no symptoms then he needs no change in therapy.  Call Mr. Mottola with the results and send results to Plotnikov, Evie Lacks, MD

## 2017-05-22 NOTE — Telephone Encounter (Signed)
LM giving verbals 

## 2017-05-22 NOTE — Telephone Encounter (Signed)
New message  Sherron Monday from [facility] call requesting to speak with RN about pts heart rate. She states pts hr has been sustaining in the 50. She states it did get low in the 40s. Sherron Monday would like to know does she need to call every time it drops. Please call back to discuss

## 2017-05-22 NOTE — Telephone Encounter (Signed)
Spoke to [place] with [facility]  - she will see patient today for home visit, notes they get remote readings for patient for heart rates, are required to notify according to their parameters when they get HR readings <50.  I've asked her to call today to report any concerns once she sees patient, also to verify patient has complied with dose reduction of amiodarone to 200mg  daily as advised by Dr. Percival Spanish (see in 6/15 note) She voiced agreement w plan. Will update accordingly once RN calls back w new information.

## 2017-05-22 NOTE — Telephone Encounter (Signed)
Physical therapists Called stating Pt is making great progress with home visits and wants to do additional visits for 1x2

## 2017-05-22 NOTE — Telephone Encounter (Signed)
Addendum: home health RN informs me patient was in fact compliant w amiodarone dose reduction: He has been taking 200mg  ONE DAILY.  Informed her we would seek Dr. Rosezella Florida review and whether further med adjustment or parameter changes for notification was advised.

## 2017-05-23 DIAGNOSIS — N138 Other obstructive and reflux uropathy: Secondary | ICD-10-CM | POA: Diagnosis not present

## 2017-05-23 DIAGNOSIS — R351 Nocturia: Secondary | ICD-10-CM | POA: Diagnosis not present

## 2017-05-23 DIAGNOSIS — R3915 Urgency of urination: Secondary | ICD-10-CM | POA: Diagnosis not present

## 2017-05-23 DIAGNOSIS — N401 Enlarged prostate with lower urinary tract symptoms: Secondary | ICD-10-CM | POA: Diagnosis not present

## 2017-05-23 NOTE — Telephone Encounter (Signed)
LEFT DETAILED MESSAGE-Lauren RN with [facility]  Note routed via epic to Plotnikov, Evie Lacks, MD

## 2017-05-28 MED ORDER — DICLOFENAC SODIUM 1 % TD GEL: 2.0000 g | Freq: Four times a day (QID) | TRANSDERMAL | 0 refills | Status: DC | PRN

## 2017-05-28 NOTE — Telephone Encounter (Signed)
[facility] physical therapists, Pt reports mid to moderate pain in knee and Pt added old Rx of Diclofenac Sodium which has a level 2 interaction with Eliquis   [phone]

## 2017-05-28 NOTE — Telephone Encounter (Signed)
Is Dicloenac topical? If yes - it is ok Thx

## 2017-05-28 NOTE — Telephone Encounter (Signed)
[name] [name] gave MD response. He states he isn't 100 % sure, but if provider feels that is ok send rx to pt pharmacy. Called pt to inform of change and correct pharmacy. Sent rx to [facility]...[name] [name]

## 2017-05-30 ENCOUNTER — Ambulatory Visit (INDEPENDENT_AMBULATORY_CARE_PROVIDER_SITE_OTHER): Payer: Medicare Other | Admitting: Internal Medicine

## 2017-05-30 ENCOUNTER — Encounter: Payer: Self-pay | Admitting: Internal Medicine

## 2017-05-30 DIAGNOSIS — I10 Essential (primary) hypertension: Secondary | ICD-10-CM | POA: Diagnosis not present

## 2017-05-30 DIAGNOSIS — I420 Dilated cardiomyopathy: Secondary | ICD-10-CM | POA: Diagnosis not present

## 2017-05-30 DIAGNOSIS — N183 Chronic kidney disease, stage 3 unspecified: Secondary | ICD-10-CM

## 2017-05-30 DIAGNOSIS — K5900 Constipation, unspecified: Secondary | ICD-10-CM

## 2017-05-30 NOTE — Assessment & Plan Note (Signed)
Monitor BMET. 

## 2017-05-30 NOTE — Progress Notes (Signed)
Subjective:  Patient ID: [name] [name], male    DOB: 02/18/1920  Age: 83 y.o. MRN: [id number]  CC: No chief complaint on file.   HPI [name] [name] presents for CHF,  anxiety, OA, constipation f/u  Outpatient Medications Prior to Visit  Medication Sig Dispense Refill  . acetaminophen-codeine (TYLENOL #2) 300-15 MG tablet Take 1 tablet by mouth every 8 (eight) hours as needed for moderate pain. 10 tablet 0  . ALPRAZolam (XANAX) 1 MG tablet TAKE ONE TABLET AT BEDTIME. 30 tablet 0  . amiodarone (PACERONE) 200 MG tablet Take 1 tablet (200 mg total) by mouth daily. 30 tablet 5  . benzonatate (TESSALON) 100 MG capsule Take 1 capsule (100 mg total) by mouth 3 (three) times daily as needed for cough. 20 capsule 0  . diclofenac sodium (VOLTAREN) 1 % GEL Apply 2 g topically 4 (four) times daily as needed. 100 g 0  . ELIQUIS 2.5 MG TABS tablet TAKE 1 TABLET TWICE DAILY. (Patient taking differently: TAKE 1 TABLET BY MOUTH TWICE DAILY.) 60 tablet 11  . finasteride (PROSCAR) 5 MG tablet Take 1 tablet (5 mg total) by mouth daily. 90 tablet 3  . furosemide (LASIX) 40 MG tablet Take 1.5 tablets (60 mg total) by mouth daily. 45 tablet 5  . guaiFENesin-dextromethorphan (ROBITUSSIN DM) 100-10 MG/5ML syrup Take 5 mLs by mouth every 4 (four) hours as needed for cough (chest congestion). 118 mL 0  . linaclotide (LINZESS) 145 MCG CAPS capsule Take 1 capsule (145 mcg total) by mouth daily before breakfast. 30 capsule 2  . linaclotide (LINZESS) 290 MCG CAPS capsule Take 1 capsule (290 mcg total) by mouth daily before breakfast. 30 capsule 3  . Multiple Vitamin (MULTIVITAMIN WITH MINERALS) TABS tablet Take 1 tablet by mouth daily.    . Multiple Vitamins-Minerals (ICAPS PO) Take 1 capsule by mouth daily.    . pravastatin (PRAVACHOL) 20 MG tablet Take 1 tablet (20 mg total) by mouth daily. 30 tablet 11  . Promethazine-Codeine 6.25-10 MG/5ML SOLN TAKE 1 TEASPOONFUL (5ML) EVERY 4 HOURS AS NEEDED FOR COUGH. 300 mL 0  .  ranitidine (ZANTAC) 150 MG tablet Take 150 mg by mouth 2 (two) times daily as needed for heartburn.   1   No facility-administered medications prior to visit.     ROS Review of Systems  Constitutional: Positive for fatigue. Negative for appetite change and unexpected weight change.  HENT: Negative for congestion, nosebleeds, sneezing, sore throat and trouble swallowing.   Eyes: Negative for itching and visual disturbance.  Respiratory: Negative for cough.   Cardiovascular: Negative for chest pain, palpitations and leg swelling.  Gastrointestinal: Negative for abdominal distention, blood in stool, diarrhea and nausea.  Genitourinary: Positive for frequency and urgency. Negative for hematuria.  Musculoskeletal: Positive for arthralgias and gait problem. Negative for back pain, joint swelling and neck pain.  Skin: Negative for rash.  Neurological: Negative for dizziness, tremors, speech difficulty and weakness.  Psychiatric/Behavioral: Negative for agitation, dysphoric mood and sleep disturbance. The patient is not nervous/anxious.     Objective:  BP 110/64 (BP Location: Left Arm, Patient Position: Sitting, Cuff Size: Large)   Pulse (!) 56   Temp 97.7 F (36.5 C) (Oral)   Ht 5\' 8"  (1.727 m)   Wt 187 lb (84.8 kg)   SpO2 98%   BMI 28.43 kg/m   BP Readings from Last 3 Encounters:  05/30/17 110/64  05/01/17 132/60  04/24/17 122/68    Wt Readings from Last 3 Encounters:  05/30/17  187 lb (84.8 kg)  05/01/17 192 lb (87.1 kg)  04/24/17 190 lb (86.2 kg)    Physical Exam  Constitutional: He is oriented to person, place, and time. He appears well-developed. No distress.  NAD  HENT:  Mouth/Throat: Oropharynx is clear and moist.  Eyes: Pupils are equal, round, and reactive to light. Conjunctivae are normal.  Neck: Normal range of motion. No JVD present. No thyromegaly present.  Cardiovascular: Normal rate, regular rhythm, normal heart sounds and intact distal pulses.  Exam reveals  no gallop and no friction rub.   No murmur heard. Pulmonary/Chest: Effort normal and breath sounds normal. No respiratory distress. He has no wheezes. He has no rales. He exhibits no tenderness.  Abdominal: Soft. Bowel sounds are normal. He exhibits no distension and no mass. There is no tenderness. There is no rebound and no guarding.  Musculoskeletal: Normal range of motion. He exhibits tenderness. He exhibits no edema.  Lymphadenopathy:    He has no cervical adenopathy.  Neurological: He is alert and oriented to person, place, and time. He has normal reflexes. No cranial nerve deficit. He exhibits normal muscle tone. He displays a negative Romberg sign. Coordination abnormal. Gait normal.  Skin: Skin is warm and dry. No rash noted.  Psychiatric: He has a normal mood and affect. His behavior is normal. Judgment and thought content normal.  Cane  Lab Results  Component Value Date   WBC 5.3 04/13/2017   HGB 14.0 04/13/2017   HCT 42.8 04/13/2017   PLT 138 (L) 04/13/2017   GLUCOSE 132 (H) 04/24/2017   CHOL 110 05/25/2013   TRIG 83.0 05/25/2013   HDL 39.00 (L) 05/25/2013   LDLCALC 54 05/25/2013   ALT 61 04/06/2017   AST 37 04/06/2017   NA 140 04/24/2017   K 3.9 04/24/2017   CL 105 04/24/2017   CREATININE 1.51 (H) 04/24/2017   BUN 27 (H) 04/24/2017   CO2 26 04/24/2017   TSH 1.573 04/07/2017   PSA 0.24 03/08/2014   INR 1.34 04/06/2017    Dg Chest 2 View  Result Date: 04/24/2017 CLINICAL DATA:  Followup rib fracture COMPARISON:  04/07/2017, 04/06/2017 FINDINGS: The previously identified rib fractures are not well appreciated on this exam. No pneumothorax is noted. Mild blunting of the right costophrenic angle is noted consistent with a small effusion. Small left effusion is also noted. No compression deformities are seen. No acute bony abnormality is noted. Significant improvement in the left basilar atelectasis is noted. No nipple shadows are noted. IMPRESSION: Small pleural effusions  bilaterally. No other focal abnormality is noted. Electronically Signed   By: [name] M.D.   On: 04/24/2017 14:28    Assessment & Plan:   There are no diagnoses linked to this encounter. I am having Mr. Horney maintain his ELIQUIS, finasteride, multivitamin with minerals, Multiple Vitamins-Minerals (ICAPS PO), ranitidine, acetaminophen-codeine, benzonatate, guaiFENesin-dextromethorphan, linaclotide, pravastatin, furosemide, ALPRAZolam, Promethazine-Codeine, amiodarone, linaclotide, and diclofenac sodium.  No orders of the defined types were placed in this encounter.    Follow-up: No Follow-up on file.  Walker Kehr, MD

## 2017-05-30 NOTE — Assessment & Plan Note (Signed)
On anticoagulation and furosemide 60 mg/d

## 2017-05-30 NOTE — Assessment & Plan Note (Signed)
Toprol, Furosemide 

## 2017-05-30 NOTE — Assessment & Plan Note (Signed)
Linzess, Miralax

## 2017-06-03 ENCOUNTER — Ambulatory Visit: Payer: Medicare Other | Admitting: Internal Medicine

## 2017-06-04 ENCOUNTER — Telehealth: Payer: Self-pay | Admitting: Internal Medicine

## 2017-06-04 NOTE — Telephone Encounter (Signed)
Spoke with pt and he states he is taking Linzess 238mcg daily and sometimes he may go a couple of days without a BM. Dr. Alain Marion told him to add a dose of Miralax if he needed to. He reports he is getting better. Asked pt to call back in a week and let us know how he was doing.

## 2017-06-05 ENCOUNTER — Telehealth: Payer: Self-pay | Admitting: Cardiovascular Disease

## 2017-06-05 NOTE — Telephone Encounter (Signed)
Lauren from [facility] calling in regards to getting wound care orders. Please call to discuss, thanks.

## 2017-06-05 NOTE — Telephone Encounter (Signed)
S/w Lauren ([place]) she states that pt current order for his skin tear on his LUE is healing too slowly and per their protocol she is asking if it is ok to change current orders to petroleum with dry dressing change 3 times Qweek then as drainage decreases change dressing twice weekly. Is this ok? Please advise

## 2017-06-05 NOTE — Telephone Encounter (Signed)
This is Dr. Rosezella Florida patient, routing to clinical team at [facility]

## 2017-06-05 NOTE — Telephone Encounter (Signed)
Lauren ([place]) notified informed to call [facility] for future orders

## 2017-06-05 NOTE — Telephone Encounter (Signed)
Can they involve the primary MD.  I will OK this order but in the future these questions should not come to cardiology.  Thanks.

## 2017-06-12 ENCOUNTER — Telehealth: Payer: Self-pay | Admitting: Internal Medicine

## 2017-06-12 NOTE — Telephone Encounter (Signed)
MD out of office pls advise on msg below.../lmb 

## 2017-06-12 NOTE — Telephone Encounter (Signed)
yes

## 2017-06-12 NOTE — Telephone Encounter (Signed)
Patient had BP drop 106/59 sitting, 77/52 standing.  Patient denies symptoms. Requesting verbal orders to see patient for one time a week for 8 weeks (with 2 as needed visits) for CHF, Hypertension, wound care (petroleum gauze and dry dressing 3 x week), and telemonitoring in home.

## 2017-06-12 NOTE — Telephone Encounter (Signed)
Verbals given  

## 2017-06-16 DIAGNOSIS — F329 Major depressive disorder, single episode, unspecified: Secondary | ICD-10-CM | POA: Diagnosis not present

## 2017-06-16 DIAGNOSIS — I4891 Unspecified atrial fibrillation: Secondary | ICD-10-CM | POA: Diagnosis not present

## 2017-06-16 DIAGNOSIS — S2241XD Multiple fractures of ribs, right side, subsequent encounter for fracture with routine healing: Secondary | ICD-10-CM | POA: Diagnosis not present

## 2017-06-16 DIAGNOSIS — I130 Hypertensive heart and chronic kidney disease with heart failure and stage 1 through stage 4 chronic kidney disease, or unspecified chronic kidney disease: Secondary | ICD-10-CM | POA: Diagnosis not present

## 2017-06-16 DIAGNOSIS — D696 Thrombocytopenia, unspecified: Secondary | ICD-10-CM | POA: Diagnosis not present

## 2017-06-16 DIAGNOSIS — F419 Anxiety disorder, unspecified: Secondary | ICD-10-CM | POA: Diagnosis not present

## 2017-06-16 DIAGNOSIS — I5022 Chronic systolic (congestive) heart failure: Secondary | ICD-10-CM | POA: Diagnosis not present

## 2017-06-16 DIAGNOSIS — S40921D Unspecified superficial injury of right upper arm, subsequent encounter: Secondary | ICD-10-CM | POA: Diagnosis not present

## 2017-06-16 DIAGNOSIS — E785 Hyperlipidemia, unspecified: Secondary | ICD-10-CM | POA: Diagnosis not present

## 2017-06-16 DIAGNOSIS — N183 Chronic kidney disease, stage 3 (moderate): Secondary | ICD-10-CM | POA: Diagnosis not present

## 2017-06-30 ENCOUNTER — Ambulatory Visit (INDEPENDENT_AMBULATORY_CARE_PROVIDER_SITE_OTHER): Payer: Medicare Other | Admitting: Internal Medicine

## 2017-06-30 ENCOUNTER — Encounter: Payer: Self-pay | Admitting: Internal Medicine

## 2017-06-30 ENCOUNTER — Encounter (INDEPENDENT_AMBULATORY_CARE_PROVIDER_SITE_OTHER): Payer: Self-pay

## 2017-06-30 ENCOUNTER — Telehealth: Payer: Self-pay | Admitting: Internal Medicine

## 2017-06-30 VITALS — BP 116/64 | HR 53 | Ht 68.0 in | Wt 185.0 lb

## 2017-06-30 DIAGNOSIS — F329 Major depressive disorder, single episode, unspecified: Secondary | ICD-10-CM | POA: Diagnosis not present

## 2017-06-30 DIAGNOSIS — F419 Anxiety disorder, unspecified: Secondary | ICD-10-CM | POA: Diagnosis not present

## 2017-06-30 DIAGNOSIS — E785 Hyperlipidemia, unspecified: Secondary | ICD-10-CM | POA: Diagnosis not present

## 2017-06-30 DIAGNOSIS — S2241XD Multiple fractures of ribs, right side, subsequent encounter for fracture with routine healing: Secondary | ICD-10-CM | POA: Diagnosis not present

## 2017-06-30 DIAGNOSIS — K5909 Other constipation: Secondary | ICD-10-CM

## 2017-06-30 DIAGNOSIS — I5022 Chronic systolic (congestive) heart failure: Secondary | ICD-10-CM | POA: Diagnosis not present

## 2017-06-30 DIAGNOSIS — N183 Chronic kidney disease, stage 3 (moderate): Secondary | ICD-10-CM | POA: Diagnosis not present

## 2017-06-30 DIAGNOSIS — I4891 Unspecified atrial fibrillation: Secondary | ICD-10-CM

## 2017-06-30 DIAGNOSIS — S40921D Unspecified superficial injury of right upper arm, subsequent encounter: Secondary | ICD-10-CM | POA: Diagnosis not present

## 2017-06-30 DIAGNOSIS — I130 Hypertensive heart and chronic kidney disease with heart failure and stage 1 through stage 4 chronic kidney disease, or unspecified chronic kidney disease: Secondary | ICD-10-CM | POA: Diagnosis not present

## 2017-06-30 DIAGNOSIS — D696 Thrombocytopenia, unspecified: Secondary | ICD-10-CM | POA: Diagnosis not present

## 2017-06-30 MED ORDER — LINACLOTIDE 290 MCG PO CAPS
28.0000 ug | ORAL_CAPSULE | Freq: Every day | ORAL | 0 refills | Status: DC
Start: 2017-06-30 — End: 2018-08-10

## 2017-06-30 MED ORDER — SENNA 8.6 MG PO TABS: 1.0000 | ORAL_TABLET | Freq: Every day | ORAL | 0 refills | Status: DC

## 2017-06-30 NOTE — Telephone Encounter (Signed)
I have placed samples at the front desk for pt pick up. He verbalizes understanding.

## 2017-06-30 NOTE — Patient Instructions (Signed)
Please purchase the following medications over the counter and take as directed: Senna (senakot) take 1 every night at bedtime  Continue taking Linzess 248mcg  Please follow up with Dr. Hilarie Fredrickson in 3 to 6 months or sooner if needed.  If you are age 81 or older, your body mass index should be between 23-30. Your Body mass index is 28.13 kg/m. If this is out of the aforementioned range listed, please consider follow up with your Primary Care Provider.  If you are age 56 or younger, your body mass index should be between 19-25. Your Body mass index is 28.13 kg/m. If this is out of the aformentioned range listed, please consider follow up with your Primary Care Provider.

## 2017-06-30 NOTE — Progress Notes (Signed)
   Subjective:    Patient ID: [name] [name], male    DOB: 09-09-1920, 83 y.o.   MRN: [id number]  HPI [name] [name] is a 83 year old male with a history of CAD, cardia myopathy, atrial fibrillation on Eliquis, hypertension, hyperlipidemia and constipation who seen in follow-up. He was seen 2 months ago to discuss constipation. After that visit he was started initially on Linzess 145 g daily which after several weeks was increased to 290 g daily due to inefficacy. Today he reports that he is definitely better than he was prior to the [place] but he is still having some constipation. For him this is primarily the need to strain to begin bowel movement. Stools are occurring more frequently but not as frequently as he would like. He denies abdominal pain. Denies bloating. He has used MiraLAX but not on a consistent basis. Right now he's having a bowel movement almost days became go as long as 2-1/2 days without bowel movement. No bleeding or blood in stool.  Review of Systems As per history of present illness, otherwise negative   Current Medications, Allergies, Past Medical History, Past Surgical History, Family History and Social History were reviewed in [employer] record.     Objective:   Physical Exam BP 116/64 (BP Location: Left Arm, Patient Position: Sitting, Cuff Size: Normal)   Pulse (!) 53   Ht 5\' 8"  (1.727 m)   Wt 185 lb (83.9 kg)   BMI 28.13 kg/m  Constitutional: Well-developed and well-nourished. No distress. HEENT: Normocephalic and atraumatic. Oropharynx is clear and moist.   No scleral icterus. Neck: Neck supple. Trachea midline. Cardiovascular: Normal rate, regular rhythm and intact distal pulses.   Pulmonary/chest: Effort normal and breath sounds normal. No wheezing, rales or rhonchi. Abdominal: Soft, nontender, nondistended. Bowel sounds active throughout.   Extremities: no clubbing, cyanosis, or edema Neurological: Alert and oriented to person place and  time. Skin: Skin is warm and dry. Psychiatric: Normal mood and affect. Behavior is normal.     Assessment & Plan:   83 year old male with a history of CAD, cardia myopathy, atrial fibrillation on Eliquis, hypertension, hyperlipidemia and constipation who seen in follow-up.  1. Constipation, chronic -- Patient with improvement with Linzess but not complete improvement. We discussed adding MiraLAX on a regular basis but he would prefer adding back Senokot which worked for him in the past. Burnis Medin have him continue Linzess 290 g 30 minutes before breakfast and at Senokot 1 tablet at bedtime. I asked that he notify me after several weeks as to the efficacy of this regimen. He is happy with this plan and will call with an update. Therapy can be adjusted if needed after adequate trial of these medicines.  15 minutes spent with the patient today. Greater than 50% was spent in counseling and coordination of care with the patient

## 2017-07-01 ENCOUNTER — Telehealth: Payer: Self-pay | Admitting: Cardiology

## 2017-07-01 MED ORDER — APIXABAN 2.5 MG PO TABS: 2.5000 mg | ORAL_TABLET | Freq: Two times a day (BID) | ORAL | 1 refills | Status: DC

## 2017-07-01 NOTE — Telephone Encounter (Signed)
New Message      *STAT* If patient is at the pharmacy, call can be transferred to refill team.   1. Which medications need to be refilled? (please list name of each medication and dose if known)   eliquis   2. Which pharmacy/location (including street and city if local pharmacy) is medication to be sent to? [facility] mail order [phone]  3. Do they need a 30 day or 90 day supply?  [place]

## 2017-07-10 ENCOUNTER — Other Ambulatory Visit: Payer: Self-pay | Admitting: Internal Medicine

## 2017-07-18 ENCOUNTER — Telehealth: Payer: Self-pay | Admitting: Internal Medicine

## 2017-07-18 NOTE — Telephone Encounter (Signed)
MD is out of office pls advise../lmb 

## 2017-07-18 NOTE — Telephone Encounter (Signed)
Ok to treat wounds per [facility] protocol.

## 2017-07-18 NOTE — Telephone Encounter (Signed)
Sharee Pimple from [facility] called stating that she went out to visit the pt yesterday. She said that he fell either Saturday or Sunday and has bruising on his upper left arm and a skin tear in between him left thumb and wrist. She would like orders to be able to treat these injuries.

## 2017-07-20 ENCOUNTER — Other Ambulatory Visit: Payer: Self-pay | Admitting: Internal Medicine

## 2017-07-22 NOTE — Telephone Encounter (Signed)
Called [name] [name] no answer [facility] w/[name] [name] response...[name] [name]

## 2017-07-23 ENCOUNTER — Telehealth: Payer: Self-pay | Admitting: Internal Medicine

## 2017-07-23 NOTE — Telephone Encounter (Signed)
He has been having constipation which we have been treating Continue Linzess 290 g daily Would add senna 2 tablets at bedtime, I believe he was previously using 1 And add MiraLAX back 17 g daily Have him let me know in 10 to 14 days if not improving on this new regimen

## 2017-07-23 NOTE — Telephone Encounter (Signed)
Pt taking linzess 262mcg and is still having problems with constipation. States he only had a little bit of a BM in the past 3 days. Pt wants to know what else he can do or try. Please advise.

## 2017-07-23 NOTE — Telephone Encounter (Signed)
Message left for pt on his home phone per his request.

## 2017-07-24 ENCOUNTER — Other Ambulatory Visit: Payer: Self-pay | Admitting: Internal Medicine

## 2017-07-25 ENCOUNTER — Other Ambulatory Visit: Payer: Self-pay | Admitting: Internal Medicine

## 2017-07-28 ENCOUNTER — Telehealth: Payer: Self-pay | Admitting: Cardiology

## 2017-07-28 NOTE — Telephone Encounter (Signed)
Faxed signed orders for home health - certification, plan of care and addendum(s) to plan of treatment to [facility]

## 2017-07-29 NOTE — Telephone Encounter (Signed)
Pt called stating that the pharmacy has not received this prescription. Can it be resent to [facility]?

## 2017-07-29 NOTE — Telephone Encounter (Signed)
Routing to dr plotnikov, please advise, thanks 

## 2017-08-01 ENCOUNTER — Encounter: Payer: Self-pay | Admitting: Internal Medicine

## 2017-08-01 ENCOUNTER — Telehealth: Payer: Self-pay | Admitting: Cardiology

## 2017-08-01 ENCOUNTER — Ambulatory Visit (INDEPENDENT_AMBULATORY_CARE_PROVIDER_SITE_OTHER): Payer: Medicare Other | Admitting: Internal Medicine

## 2017-08-01 DIAGNOSIS — M79602 Pain in left arm: Secondary | ICD-10-CM | POA: Diagnosis not present

## 2017-08-01 NOTE — Telephone Encounter (Signed)
Returned call to [place] with [facility].She stated she saw patient this morning his resting pulse was 50.Stated patient was concerned.He feels ok.He wanted to know parameters.Advised resting pulse at 50 is ok.Advised pulse should be 50 to 100.Advised if he starts having symptoms dizzy,light headed or just don't feel good he should call us.Message sent to [place] for review.

## 2017-08-01 NOTE — Assessment & Plan Note (Signed)
2 skin tears about 1 cm each which are wrapped today, 1 skin tear on the finger which is about 1 cm by .5 cm square. Some oozing but bleeding mostly stopped. Tetanus up to date. Advised on changing dressing changes.

## 2017-08-01 NOTE — Telephone Encounter (Signed)
New message   Ubaldo Glassing from [facility] is calling because patient heart rate is normally low. Pt wants to know at what point should he call the office to let us know.

## 2017-08-01 NOTE — Patient Instructions (Signed)
We have wrapped up the arm today. Leave it on for 3-4 days on the arm. For the finger try to keep it from being hit or injured. Change the dressing as needed. The less you change the bandage the quicker it will heal.

## 2017-08-01 NOTE — Progress Notes (Signed)
   Subjective:    Patient ID: [name] [name], male    DOB: 1920-07-02, 83 y.o.   MRN: [id number]  HPI The patient is a 83 YO man coming in for fall and injury to his arms. He fell onto concrete by leaning forward to pick something up and losing his balance. Denies LOC or head injury. Happened yesterday evening and did not seek care then. Wanted to make sure he does not need tetanus. He is on eliquis and is worried about bleeding. He also has voltaren on his list but is not taking that. Was able to get up on his own. Was not using his cane. Lives alone but a neighbor helped to wrap him up.   Review of Systems  Constitutional: Negative for activity change, appetite change, fatigue, fever and unexpected weight change.  Respiratory: Negative.   Cardiovascular: Negative.   Gastrointestinal: Negative.   Musculoskeletal: Positive for myalgias.  Skin: Positive for wound.  Neurological: Negative.       Objective:   Physical Exam  Constitutional: He is oriented to person, place, and time. He appears well-developed and well-nourished.  HENT:  Head: Normocephalic and atraumatic.  Eyes:  1 eye with chronic changes  Cardiovascular: Normal rate and regular rhythm.   Pulmonary/Chest: Effort normal. No respiratory distress. He has no wheezes. He has no rales.  Abdominal: Soft.  Neurological: He is alert and oriented to person, place, and time. Coordination normal.  Uses cane  Skin: Skin is warm and dry.  2 of the 1 cm skin tear on the forearm and 1 cm by .5 cm skin tear on the finger left hand.    Vitals:   08/01/17 0808  BP: (!) 118/58  Pulse: (!) 52  Temp: 97.8 F (36.6 C)  SpO2: 96%  Weight: 182 lb (82.6 kg)  Height: 5\' 8"  (1.727 m)      Assessment & Plan:

## 2017-08-01 NOTE — Telephone Encounter (Signed)
Agree 

## 2017-08-06 ENCOUNTER — Telehealth: Payer: Self-pay | Admitting: Cardiology

## 2017-08-06 MED ORDER — AMIODARONE HCL 200 MG PO TABS: 100.0000 mg | ORAL_TABLET | Freq: Every day | ORAL | 5 refills | Status: DC

## 2017-08-06 MED ORDER — FUROSEMIDE 40 MG PO TABS: 40.0000 mg | ORAL_TABLET | Freq: Every day | ORAL | 5 refills | Status: DC

## 2017-08-06 NOTE — Telephone Encounter (Signed)
Get a BMET, CBC.  Reduce amio to 100 mg daily and decreased Lasix to 40 mg daily.  He can be scheduled to see an APP after this.

## 2017-08-06 NOTE — Telephone Encounter (Signed)
New Message  Alexis from [facility] called stating she was instructed to call if pt heart rate was below 50. She states its ranging from 48-50. She states pt is dizzy when standing and his bp sitting is 118/64. Standing 82/50. Ubaldo Glassing would like to speak with RN to see if pt medication need to be adjusted. Please call back to discuss

## 2017-08-06 NOTE — Telephone Encounter (Signed)
[name] [name] from [facility] notified of  Dr hochrein's orders she will have someone come to [facility] labs requested if she is not able to have these drawn by Monday she will call back with when they can come to have done. F/u appt scheduled with Kerin Ransom pa 08-19-17 @1030am , She will call pt and let them know about lab orders, appt and medication changes. She states that she needs orders to continue her visit as he only has a few or 1 left she states that she will call pcp and extend her visits/ she will call back if she needs anything else.

## 2017-08-07 ENCOUNTER — Other Ambulatory Visit: Payer: Self-pay | Admitting: Internal Medicine

## 2017-08-07 ENCOUNTER — Telehealth: Payer: Self-pay | Admitting: Cardiology

## 2017-08-07 DIAGNOSIS — N183 Chronic kidney disease, stage 3 (moderate): Secondary | ICD-10-CM | POA: Diagnosis not present

## 2017-08-07 NOTE — Telephone Encounter (Signed)
Left message for [name] [name] to call

## 2017-08-07 NOTE — Telephone Encounter (Signed)
New Message  Ubaldo Glassing verbalized that she is calling for rn   To go over lab results

## 2017-08-08 NOTE — Telephone Encounter (Signed)
Patient is currently out of medication.   Requesting to be sent soon

## 2017-08-08 NOTE — Telephone Encounter (Signed)
Routing to dr plotnikov, please advise, thanks 

## 2017-08-11 ENCOUNTER — Telehealth: Payer: Self-pay | Admitting: *Deleted

## 2017-08-11 DIAGNOSIS — Z79899 Other long term (current) drug therapy: Secondary | ICD-10-CM

## 2017-08-11 NOTE — Telephone Encounter (Signed)
-----   Message from Minus Breeding, MD sent at 08/07/2017  5:45 PM EDT ----- Needs to repeat the BMET next week.  Creat is up.  Call Mr. Erichsen with the results and send results to Plotnikov, Evie Lacks, MD

## 2017-08-11 NOTE — Telephone Encounter (Signed)
Call and spoke with pt about his blood work, advised him we will need to repeat it again this week, pt stated he will come in on Friday to get it drawn, BMP ordered placed in Epic, pt is aware

## 2017-08-12 ENCOUNTER — Telehealth: Payer: Self-pay | Admitting: Internal Medicine

## 2017-08-12 ENCOUNTER — Telehealth: Payer: Self-pay | Admitting: *Deleted

## 2017-08-12 DIAGNOSIS — Z79899 Other long term (current) drug therapy: Secondary | ICD-10-CM | POA: Diagnosis not present

## 2017-08-12 LAB — BASIC METABOLIC PANEL
BUN/Creatinine Ratio: 13 (ref 10–24)
BUN: 24 mg/dL (ref 10–36)
CALCIUM: 8.7 mg/dL (ref 8.6–10.2)
CO2: 25 mmol/L (ref 20–29)
CREATININE: 1.84 mg/dL — AB (ref 0.76–1.27)
Chloride: 103 mmol/L (ref 96–106)
GFR calc Af Amer: 35 mL/min/{1.73_m2} — ABNORMAL LOW (ref >59)
GFR, EST NON AFRICAN AMERICAN: 30 mL/min/{1.73_m2} — AB (ref >59)
GLUCOSE: 89 mg/dL (ref 65–99)
Potassium: 4.2 mmol/L (ref 3.5–5.2)
SODIUM: 142 mmol/L (ref 134–144)

## 2017-08-12 MED ORDER — ALPRAZOLAM 1 MG PO TABS: 1.0000 mg | ORAL_TABLET | Freq: Every day | ORAL | 2 refills | Status: DC

## 2017-08-12 NOTE — Telephone Encounter (Signed)
Refill faxed to pharmacy.  Pt made aware.

## 2017-08-12 NOTE — Telephone Encounter (Signed)
OK to fill this/these prescription(s) with additional refills x2 Keep ROV Thank you!

## 2017-08-12 NOTE — Telephone Encounter (Signed)
Pt will be discharged from home health tomorrow  Will needs verbals for OK. Tomorrow is last day of certification period Please call back

## 2017-08-12 NOTE — Telephone Encounter (Signed)
OK. Thx

## 2017-08-12 NOTE — Telephone Encounter (Signed)
Pt came in requesting a refill on his Alprazolam 1mg .  Last OV 7.13.18 Last filled 6.18.18 (Dr. Percival Spanish)

## 2017-08-13 NOTE — Telephone Encounter (Signed)
Notified Alexis w/MD response...Johny Chess

## 2017-08-14 ENCOUNTER — Telehealth: Payer: Self-pay | Admitting: Internal Medicine

## 2017-08-14 MED ORDER — LUBIPROSTONE 24 MCG PO CAPS: 24.0000 ug | ORAL_CAPSULE | Freq: Two times a day (BID) | ORAL | 3 refills | Status: DC

## 2017-08-14 NOTE — Telephone Encounter (Signed)
Spoke with pt and he is aware, script sent to pharmacy. 

## 2017-08-14 NOTE — Telephone Encounter (Signed)
Pt states he has not had a good BM in 3 days. He has been using the regimen Dr. Hilarie Fredrickson suggested. Reports he took 2 linzess 236mcg capsules this am. Pt also states he bought some dulcolax suppositories but has not tried those yet. Please advise.   Jerene Bears, MD      07/23/17 2:08 PM  Note    He has been having constipation which we have been treating Continue Linzess 290 g daily Would add senna 2 tablets at bedtime, I believe he was previously using 1 And add MiraLAX back 17 g daily Have him let me know in 10 to 14 days if not improving on this new regimen

## 2017-08-14 NOTE — Telephone Encounter (Signed)
Would have him try the following: Linzess 290 g daily Amitiza 24 g twice a day with meals Stop senna Okay for MiraLAX 1-3 times daily if needed but would give Linzess and Amitiza time to work before determining if he needs this Can use Dulcolax suppository daily when necessary to stimulate bowel movement

## 2017-08-15 ENCOUNTER — Telehealth: Payer: Self-pay | Admitting: *Deleted

## 2017-08-15 DIAGNOSIS — Z79899 Other long term (current) drug therapy: Secondary | ICD-10-CM

## 2017-08-15 NOTE — Telephone Encounter (Signed)
Is aware of his blood work, BMP ordered for pt to get done in 10 days, pt have an appt to see Kerin Ransom next tues 10/03.

## 2017-08-15 NOTE — Telephone Encounter (Signed)
-----   Message from Minus Breeding, MD sent at 08/14/2017 11:19 PM EDT ----- Creat is mildly elevated.  Continue current therapy and repeat a BMET in about 10 days.  Call Mr. Spies with the results and send results to Plotnikov, Evie Lacks, MD

## 2017-08-19 ENCOUNTER — Ambulatory Visit (INDEPENDENT_AMBULATORY_CARE_PROVIDER_SITE_OTHER): Payer: Medicare Other | Admitting: Cardiology

## 2017-08-19 ENCOUNTER — Encounter: Payer: Self-pay | Admitting: Cardiology

## 2017-08-19 VITALS — BP 102/62 | HR 55 | Ht 68.0 in | Wt 180.0 lb

## 2017-08-19 DIAGNOSIS — I480 Paroxysmal atrial fibrillation: Secondary | ICD-10-CM | POA: Diagnosis not present

## 2017-08-19 DIAGNOSIS — R001 Bradycardia, unspecified: Secondary | ICD-10-CM | POA: Diagnosis not present

## 2017-08-19 DIAGNOSIS — N183 Chronic kidney disease, stage 3 unspecified: Secondary | ICD-10-CM

## 2017-08-19 DIAGNOSIS — I2510 Atherosclerotic heart disease of native coronary artery without angina pectoris: Secondary | ICD-10-CM | POA: Diagnosis not present

## 2017-08-19 DIAGNOSIS — I420 Dilated cardiomyopathy: Secondary | ICD-10-CM | POA: Diagnosis not present

## 2017-08-19 DIAGNOSIS — Z7901 Long term (current) use of anticoagulants: Secondary | ICD-10-CM | POA: Insufficient documentation

## 2017-08-19 DIAGNOSIS — I452 Bifascicular block: Secondary | ICD-10-CM | POA: Insufficient documentation

## 2017-08-19 MED ORDER — AMIODARONE HCL 200 MG PO TABS: 100.0000 mg | ORAL_TABLET | ORAL | 3 refills | Status: DC

## 2017-08-19 NOTE — Assessment & Plan Note (Signed)
3b with SCr 1.85 Sept 2018 May be slightly dehydrated

## 2017-08-19 NOTE — Patient Instructions (Signed)
Kerin Ransom, PA-C has recommended making the following medication changes: 1. DECREASE Amiodarone to 100 mg 3 days a week - Mondays, Wednesdays, and Fridays  IF YOU ARE TAKING FUROSEMIDE 20 MG DAILY - PLEASE CONTINUE IF YOU ARE TAKING FUROSEMIDE 40 MG DAILY - PLEASE DECREASE - TAKE 1 TABLET ON MONDAYS, WEDNESDAYS, AND FRIDAYS.  Please keep your appointment with Dr Percival Spanish scheduled for 09/11/17.

## 2017-08-19 NOTE — Assessment & Plan Note (Signed)
Low dose Eliquis 

## 2017-08-19 NOTE — Assessment & Plan Note (Signed)
Amiodarone cut back to 100 mg daily 9/19. His HR is still in the low 50''s with bifascicular block
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
## 2017-08-19 NOTE — Assessment & Plan Note (Signed)
2017 new cardiomyopathy with an EF of  20% presumed secondary to AF EF 25-30% May 2018 No CHF on exam

## 2017-08-19 NOTE — Assessment & Plan Note (Signed)
TEE CV in 2017 and May 2018 (Amiodarone added) NSR today

## 2017-08-20 ENCOUNTER — Telehealth: Payer: Self-pay

## 2017-08-20 NOTE — Telephone Encounter (Signed)
Pt left receipt from visit w/ blank signed check. [email or web address]  and let us know if he wants to come and pick up or have it mailed. Put in envelope and taken to the front desk for pickup

## 2017-09-01 ENCOUNTER — Ambulatory Visit: Payer: Medicare Other | Admitting: Internal Medicine
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
## 2017-09-11 ENCOUNTER — Ambulatory Visit (INDEPENDENT_AMBULATORY_CARE_PROVIDER_SITE_OTHER): Payer: Medicare Other | Admitting: Cardiology

## 2017-09-11 ENCOUNTER — Encounter: Payer: Self-pay | Admitting: Cardiology

## 2017-09-11 VITALS — BP 101/65 | HR 55 | Ht 68.0 in | Wt 181.6 lb

## 2017-09-11 DIAGNOSIS — N183 Chronic kidney disease, stage 3 unspecified: Secondary | ICD-10-CM

## 2017-09-11 DIAGNOSIS — I2510 Atherosclerotic heart disease of native coronary artery without angina pectoris: Secondary | ICD-10-CM

## 2017-09-11 DIAGNOSIS — I420 Dilated cardiomyopathy: Secondary | ICD-10-CM

## 2017-09-11 DIAGNOSIS — I10 Essential (primary) hypertension: Secondary | ICD-10-CM | POA: Diagnosis not present

## 2017-09-11 DIAGNOSIS — I480 Paroxysmal atrial fibrillation: Secondary | ICD-10-CM

## 2017-09-11 NOTE — Patient Instructions (Signed)
Medication Instructions:  Continue current medications  If you need a refill on your cardiac medications before your next appointment, please call your pharmacy.  Labwork: None Ordered  Testing/Procedures: None Ordered  Follow-Up: Your physician wants you to follow-up in: 4 Months.    Thank you for choosing [facility]!!      

## 2017-09-15 ENCOUNTER — Other Ambulatory Visit: Payer: Self-pay | Admitting: *Deleted

## 2017-09-15 ENCOUNTER — Telehealth: Payer: Self-pay | Admitting: Cardiology

## 2017-09-15 MED ORDER — AMIODARONE HCL 200 MG PO TABS: 100.0000 mg | ORAL_TABLET | ORAL | 4 refills | Status: DC

## 2017-09-15 NOTE — Telephone Encounter (Signed)
°*  STAT* If patient is at the pharmacy, call can be transferred to refill team.   1. Which medications need to be refilled? (please list name of each medication and dose if known) Amiodarone 200mg 1x day  2. Which pharmacy/location (including street and city if local pharmacy) is medication to be sent to? [place] [address]  3. Do they need a 30 day or 90 day supply?90day

## 2017-09-16 ENCOUNTER — Ambulatory Visit: Payer: Medicare Other | Admitting: Internal Medicine

## 2017-09-19 ENCOUNTER — Other Ambulatory Visit: Payer: Self-pay | Admitting: *Deleted

## 2017-09-19 ENCOUNTER — Other Ambulatory Visit: Payer: Self-pay | Admitting: Cardiology

## 2017-09-19 ENCOUNTER — Encounter: Payer: Self-pay | Admitting: Internal Medicine

## 2017-09-19 ENCOUNTER — Ambulatory Visit (INDEPENDENT_AMBULATORY_CARE_PROVIDER_SITE_OTHER): Payer: Medicare Other | Admitting: Internal Medicine

## 2017-09-19 VITALS — BP 134/70 | HR 53 | Temp 97.8°F | Ht 68.0 in | Wt 181.0 lb

## 2017-09-19 DIAGNOSIS — I420 Dilated cardiomyopathy: Secondary | ICD-10-CM | POA: Diagnosis not present

## 2017-09-19 DIAGNOSIS — N183 Chronic kidney disease, stage 3 unspecified: Secondary | ICD-10-CM

## 2017-09-19 DIAGNOSIS — I2510 Atherosclerotic heart disease of native coronary artery without angina pectoris: Secondary | ICD-10-CM

## 2017-09-19 DIAGNOSIS — E785 Hyperlipidemia, unspecified: Secondary | ICD-10-CM

## 2017-09-19 DIAGNOSIS — I480 Paroxysmal atrial fibrillation: Secondary | ICD-10-CM

## 2017-09-19 DIAGNOSIS — K5900 Constipation, unspecified: Secondary | ICD-10-CM

## 2017-09-19 DIAGNOSIS — Z23 Encounter for immunization: Secondary | ICD-10-CM

## 2017-09-19 MED ORDER — AMIODARONE HCL 200 MG PO TABS: 100.0000 mg | ORAL_TABLET | ORAL | 7 refills | Status: DC

## 2017-09-19 MED ORDER — PROMETHAZINE-CODEINE 6.25-10 MG/5ML PO SYRP: 5.0000 mL | ORAL_SOLUTION | Freq: Four times a day (QID) | ORAL | 1 refills | Status: DC | PRN

## 2017-09-19 NOTE — Progress Notes (Signed)
Subjective:  Patient ID: [name] [name], male    DOB: October 08, 1920  Age: 83 y.o. MRN: [id number]  CC: No chief complaint on file.   HPI [name] [name] presents for OA, BPH, cardiomyopathy. C/o chronic cough...   Outpatient Medications Prior to Visit  Medication Sig Dispense Refill  . ALPRAZolam (XANAX) 1 MG tablet TAKE ONE TABLET AT BEDTIME. 30 tablet 5  . amiodarone (PACERONE) 200 MG tablet Take 0.5 tablets (100 mg total) by mouth 3 (three) times a week. Mondays, Wednesdays, and Fridays 20 tablet 4  . apixaban (ELIQUIS) 2.5 MG TABS tablet Take 1 tablet (2.5 mg total) by mouth 2 (two) times daily. 180 tablet 1  . finasteride (PROSCAR) 5 MG tablet TAKE 1 TABLET ONCE DAILY. 90 tablet 0  . furosemide (LASIX) 40 MG tablet Take 1 tablet (40 mg total) by mouth daily. 45 tablet 5  . linaclotide (LINZESS) 290 MCG CAPS capsule Take 1 capsule (290 mcg total) by mouth daily before breakfast. 28 capsule 0  . Multiple Vitamin (MULTIVITAMIN WITH MINERALS) TABS tablet Take 1 tablet by mouth daily.    . Multiple Vitamins-Minerals (ICAPS PO) Take 1 capsule by mouth daily.    . pravastatin (PRAVACHOL) 20 MG tablet Take 1 tablet (20 mg total) by mouth daily. 30 tablet 11  . Promethazine-Codeine 6.25-10 MG/5ML SOLN TAKE 1 TEASPOONFUL (5ML) EVERY 4 HOURS AS NEEDED FOR COUGH. 300 mL 0  . ranitidine (ZANTAC) 150 MG tablet Take 150 mg by mouth 2 (two) times daily as needed for heartburn.   1  . senna (SENOKOT) 8.6 MG TABS tablet Take 1 tablet (8.6 mg total) by mouth at bedtime.  0   No facility-administered medications prior to visit.     ROS Review of Systems  Constitutional: Negative for appetite change, fatigue and unexpected weight change.  HENT: Negative for congestion, nosebleeds, sneezing, sore throat and trouble swallowing.   Eyes: Positive for visual disturbance. Negative for itching.  Respiratory: Positive for cough.   Cardiovascular: Negative for chest pain, palpitations and leg swelling.    Gastrointestinal: Positive for constipation. Negative for abdominal distention, blood in stool, diarrhea and nausea.  Genitourinary: Negative for frequency and hematuria.  Musculoskeletal: Positive for back pain and gait problem. Negative for joint swelling and neck pain.  Skin: Negative for rash.  Neurological: Negative for dizziness, tremors, speech difficulty and weakness.  Psychiatric/Behavioral: Negative for agitation, dysphoric mood, sleep disturbance and suicidal ideas. The patient is nervous/anxious.     Objective:  BP 134/70 (BP Location: Left Arm, Patient Position: Sitting, Cuff Size: Normal)   Pulse (!) 53   Temp 97.8 F (36.6 C) (Oral)   Ht 5\' 8"  (1.727 m)   Wt 181 lb (82.1 kg)   SpO2 97%   BMI 27.52 kg/m   BP Readings from Last 3 Encounters:  09/19/17 134/70  09/11/17 101/65  08/19/17 102/62    Wt Readings from Last 3 Encounters:  09/19/17 181 lb (82.1 kg)  09/11/17 181 lb 9.6 oz (82.4 kg)  08/19/17 180 lb (81.6 kg)    Physical Exam  Constitutional: He is oriented to person, place, and time. He appears well-developed. No distress.  NAD  HENT:  Mouth/Throat: Oropharynx is clear and moist.  Eyes: Pupils are equal, round, and reactive to light. Conjunctivae are normal.  Neck: Normal range of motion. No JVD present. No thyromegaly present.  Cardiovascular: Normal rate, regular rhythm, normal heart sounds and intact distal pulses.  Exam reveals no gallop and no friction rub.  No murmur heard. Pulmonary/Chest: Effort normal and breath sounds normal. No respiratory distress. He has no wheezes. He has no rales. He exhibits no tenderness.  Abdominal: Soft. Bowel sounds are normal. He exhibits no distension and no mass. There is no tenderness. There is no rebound and no guarding.  Musculoskeletal: Normal range of motion. He exhibits no edema or tenderness.  Lymphadenopathy:    He has no cervical adenopathy.  Neurological: He is alert and oriented to person, place,  and time. He has normal reflexes. No cranial nerve deficit. He exhibits normal muscle tone. He displays a negative Romberg sign. Coordination abnormal. Gait normal.  Skin: Skin is warm and dry. No rash noted.  Psychiatric: He has a normal mood and affect. His behavior is normal. Judgment and thought content normal.  OA ataxic  Lab Results  Component Value Date   WBC 5.3 04/13/2017   HGB 14.0 04/13/2017   HCT 42.8 04/13/2017   PLT 138 (L) 04/13/2017   GLUCOSE 89 08/12/2017   CHOL 110 05/25/2013   TRIG 83.0 05/25/2013   HDL 39.00 (L) 05/25/2013   LDLCALC 54 05/25/2013   ALT 61 04/06/2017   AST 37 04/06/2017   NA 142 08/12/2017   K 4.2 08/12/2017   CL 103 08/12/2017   CREATININE 1.84 (H) 08/12/2017   BUN 24 08/12/2017   CO2 25 08/12/2017   TSH 1.573 04/07/2017   PSA 0.24 03/08/2014   INR 1.34 04/06/2017    Dg Chest 2 View  Result Date: 04/24/2017 CLINICAL DATA:  Followup rib fracture COMPARISON:  04/07/2017, 04/06/2017 FINDINGS: The previously identified rib fractures are not well appreciated on this exam. No pneumothorax is noted. Mild blunting of the right costophrenic angle is noted consistent with a small effusion. Small left effusion is also noted. No compression deformities are seen. No acute bony abnormality is noted. Significant improvement in the left basilar atelectasis is noted. No nipple shadows are noted. IMPRESSION: Small pleural effusions bilaterally. No other focal abnormality is noted. Electronically Signed   By: [name] M.D.   On: 04/24/2017 14:28    Assessment & Plan:   There are no diagnoses linked to this encounter. I am having Mr. Herbers maintain his multivitamin with minerals, Multiple Vitamins-Minerals (ICAPS PO), ranitidine, pravastatin, senna, linaclotide, apixaban, Promethazine-Codeine, furosemide, finasteride, ALPRAZolam, and amiodarone.  No orders of the defined types were placed in this encounter.    Follow-up: No Follow-up on file.  Walker Kehr, MD

## 2017-09-19 NOTE — Telephone Encounter (Signed)
°*  STAT* If patient is at the pharmacy, call can be transferred to refill team.   1. Which medications need to be refilled? (please list name of each medication and dose if known) Amiodarone 200 mg   2. Which pharmacy/location (including street and city if local pharmacy) is medication to be sent to?[facility]   3. Do they need a 30 day or 90 day supply? [place]

## 2017-09-19 NOTE — Assessment & Plan Note (Signed)
Eliquis 

## 2017-09-19 NOTE — Assessment & Plan Note (Signed)
Miralax prn, Linzess

## 2017-09-19 NOTE — Assessment & Plan Note (Signed)
Pravastatin  

## 2017-09-19 NOTE — Telephone Encounter (Signed)
Patiently directly notified

## 2017-09-19 NOTE — Assessment & Plan Note (Signed)
Eliquis, Pravastatin 

## 2017-09-19 NOTE — Assessment & Plan Note (Signed)
Monitoring creat

## 2017-09-24 NOTE — Addendum Note (Signed)
Addended by: Karren Cobble on: 09/24/2017 01:25 PM   Modules accepted: Orders

## 2017-10-23 ENCOUNTER — Other Ambulatory Visit (INDEPENDENT_AMBULATORY_CARE_PROVIDER_SITE_OTHER): Payer: Medicare Other

## 2017-10-23 ENCOUNTER — Telehealth: Payer: Self-pay | Admitting: *Deleted

## 2017-10-23 DIAGNOSIS — I10 Essential (primary) hypertension: Secondary | ICD-10-CM | POA: Diagnosis not present

## 2017-10-23 LAB — CBC WITH DIFFERENTIAL/PLATELET
BASOS PCT: 0.9 % (ref 0.0–3.0)
Basophils Absolute: 0.1 10*3/uL (ref 0.0–0.1)
EOS PCT: 2.1 % (ref 0.0–5.0)
Eosinophils Absolute: 0.1 10*3/uL (ref 0.0–0.7)
HEMATOCRIT: 46.5 % (ref 39.0–52.0)
HEMOGLOBIN: 15.4 g/dL (ref 13.0–17.0)
LYMPHS PCT: 17.5 % (ref 12.0–46.0)
Lymphs Abs: 1.1 10*3/uL (ref 0.7–4.0)
MCHC: 33.2 g/dL (ref 30.0–36.0)
MCV: 96.4 fl (ref 78.0–100.0)
MONOS PCT: 9.9 % (ref 3.0–12.0)
Monocytes Absolute: 0.6 10*3/uL (ref 0.1–1.0)
Neutro Abs: 4.3 10*3/uL (ref 1.4–7.7)
Neutrophils Relative %: 69.6 % (ref 43.0–77.0)
Platelets: 137 10*3/uL — ABNORMAL LOW (ref 150.0–400.0)
RBC: 4.82 Mil/uL (ref 4.22–5.81)
RDW: 14 % (ref 11.5–15.5)
WBC: 6.2 10*3/uL (ref 4.0–10.5)

## 2017-10-23 LAB — URINALYSIS, ROUTINE W REFLEX MICROSCOPIC
Bilirubin Urine: NEGATIVE
Hgb urine dipstick: NEGATIVE
Ketones, ur: NEGATIVE
Leukocytes, UA: NEGATIVE
Nitrite: NEGATIVE
RBC / HPF: NONE SEEN
Specific Gravity, Urine: 1.02
Total Protein, Urine: NEGATIVE
Urine Glucose: NEGATIVE
Urobilinogen, UA: 0.2
pH: 5.5 (ref 5.0–8.0)

## 2017-10-23 LAB — COMPREHENSIVE METABOLIC PANEL
ALBUMIN: 4 g/dL (ref 3.5–5.2)
ALK PHOS: 86 U/L (ref 39–117)
ALT: 9 U/L (ref 0–53)
AST: 15 U/L (ref 0–37)
BUN: 21 mg/dL (ref 6–23)
CALCIUM: 8.9 mg/dL (ref 8.4–10.5)
CHLORIDE: 103 meq/L (ref 96–112)
CO2: 30 mEq/L (ref 19–32)
Creatinine, Ser: 1.44 mg/dL (ref 0.40–1.50)
GFR: 48.37 mL/min — AB (ref >60.00)
Glucose, Bld: 89 mg/dL (ref 70–99)
POTASSIUM: 3.9 meq/L (ref 3.5–5.1)
Sodium: 143 mEq/L (ref 135–145)
TOTAL PROTEIN: 6.8 g/dL (ref 6.0–8.3)
Total Bilirubin: 1.2 mg/dL (ref 0.2–1.2)

## 2017-10-23 NOTE — Telephone Encounter (Signed)
Copied from [place] [id number]. Topic: General - Other >> Oct 23, 2017  9:39 AM Synthia Innocent wrote: Reason for CRM: Patient is requesting to have UA/lab work order sent downstairs to Lab for today, patient is scheduled for tomorrow to Dr Alain Marion. Would like to come in today so results will be ready tomorrow. Please advise

## 2017-10-23 NOTE — Telephone Encounter (Signed)
Labs ordered, pt showed up to office

## 2017-10-24 ENCOUNTER — Ambulatory Visit: Payer: Medicare Other | Admitting: Internal Medicine

## 2017-10-24 ENCOUNTER — Encounter: Payer: Self-pay | Admitting: Internal Medicine

## 2017-10-24 DIAGNOSIS — N183 Chronic kidney disease, stage 3 unspecified: Secondary | ICD-10-CM

## 2017-10-24 DIAGNOSIS — E785 Hyperlipidemia, unspecified: Secondary | ICD-10-CM

## 2017-10-24 DIAGNOSIS — I5022 Chronic systolic (congestive) heart failure: Secondary | ICD-10-CM | POA: Diagnosis not present

## 2017-10-24 DIAGNOSIS — I480 Paroxysmal atrial fibrillation: Secondary | ICD-10-CM | POA: Diagnosis not present

## 2017-10-24 MED ORDER — ALPRAZOLAM 1 MG PO TABS: 1.0000 mg | ORAL_TABLET | Freq: Every day | ORAL | 1 refills | Status: DC

## 2017-10-24 MED ORDER — RANITIDINE HCL 150 MG PO TABS: 150.0000 mg | ORAL_TABLET | Freq: Two times a day (BID) | ORAL | 3 refills | Status: DC | PRN

## 2017-10-24 MED ORDER — FUROSEMIDE 20 MG PO TABS: 20.0000 mg | ORAL_TABLET | Freq: Every day | ORAL | 1 refills | Status: DC | PRN

## 2017-10-24 MED ORDER — FINASTERIDE 5 MG PO TABS: 5.0000 mg | ORAL_TABLET | Freq: Every day | ORAL | 3 refills | Status: DC

## 2017-10-24 NOTE — Progress Notes (Signed)
Subjective:  Patient ID: [name] [name], male    DOB: 08-28-1920  Age: 83 y.o. MRN: [id number]  CC: No chief complaint on file.   HPI [name] [name] [facility] F/u anxiety, Afib, GERD  Outpatient Medications Prior to Visit  Medication Sig Dispense Refill  . ALPRAZolam (XANAX) 1 MG tablet TAKE ONE TABLET AT BEDTIME. 30 tablet 5  . amiodarone (PACERONE) 200 MG tablet Take 0.5 tablets (100 mg total) by mouth 3 (three) times a week. Mondays, Wednesdays, and Fridays 20 tablet 7  . apixaban (ELIQUIS) 2.5 MG TABS tablet Take 1 tablet (2.5 mg total) by mouth 2 (two) times daily. 180 tablet 1  . finasteride (PROSCAR) 5 MG tablet TAKE 1 TABLET ONCE DAILY. 90 tablet 0  . furosemide (LASIX) 40 MG tablet Take 1 tablet (40 mg total) by mouth daily. 45 tablet 5  . linaclotide (LINZESS) 290 MCG CAPS capsule Take 1 capsule (290 mcg total) by mouth daily before breakfast. 28 capsule 0  . Multiple Vitamin (MULTIVITAMIN WITH MINERALS) TABS tablet Take 1 tablet by mouth daily.    . Multiple Vitamins-Minerals (ICAPS PO) Take 1 capsule by mouth daily.    . pravastatin (PRAVACHOL) 20 MG tablet Take 1 tablet (20 mg total) by mouth daily. 30 tablet 11  . promethazine-codeine (PHENERGAN WITH CODEINE) 6.25-10 MG/5ML syrup Take 5 mLs by mouth every 6 (six) hours as needed for cough. 300 mL 1  . ranitidine (ZANTAC) 150 MG tablet Take 150 mg by mouth 2 (two) times daily as needed for heartburn.   1  . senna (SENOKOT) 8.6 MG TABS tablet Take 1 tablet (8.6 mg total) by mouth at bedtime.  0   No facility-administered medications prior to visit.     ROS Review of Systems  Constitutional: Negative for appetite change, fatigue and unexpected weight change.  HENT: Negative for congestion, nosebleeds, sneezing, sore throat and trouble swallowing.   Eyes: Positive for visual disturbance. Negative for itching.  Respiratory: Negative for cough.   Cardiovascular: Negative for chest pain, palpitations and leg  swelling.  Gastrointestinal: Negative for abdominal distention, blood in stool, diarrhea and nausea.  Genitourinary: Negative for frequency and hematuria.  Musculoskeletal: Positive for arthralgias, back pain and gait problem. Negative for joint swelling and neck pain.  Skin: Negative for rash.  Neurological: Negative for dizziness, tremors, speech difficulty and weakness.  Psychiatric/Behavioral: Negative for agitation, dysphoric mood, sleep disturbance and suicidal ideas. The patient is not nervous/anxious.     Objective:  BP 126/74 (BP Location: Left Arm, Patient Position: Sitting, Cuff Size: Large)   Pulse 60   Temp 98 F (36.7 C) (Oral)   Ht 5\' 8"  (1.727 m)   Wt 181 lb (82.1 kg)   SpO2 98%   BMI 27.52 kg/m   BP Readings from Last 3 Encounters:  10/24/17 126/74  09/19/17 134/70  09/11/17 101/65    Wt Readings from Last 3 Encounters:  10/24/17 181 lb (82.1 kg)  09/19/17 181 lb (82.1 kg)  09/11/17 181 lb 9.6 oz (82.4 kg)    Physical Exam  Constitutional: He is oriented to person, place, and time. He appears well-developed. No distress.  NAD  HENT:  Mouth/Throat: Oropharynx is clear and moist.  Eyes: Conjunctivae are normal. Pupils are equal, round, and reactive to light.  Neck: Normal range of motion. No JVD present. No thyromegaly present.  Cardiovascular: Normal rate, regular rhythm, normal heart sounds and intact distal pulses. Exam reveals no gallop and no friction rub.  No murmur heard. Pulmonary/Chest: Effort normal and breath sounds normal. No respiratory distress. He has no wheezes. He has no rales. He exhibits no tenderness.  Abdominal: Soft. Bowel sounds are normal. He exhibits no distension and no mass. There is no tenderness. There is no rebound and no guarding.  Musculoskeletal: Normal range of motion. He exhibits no edema or tenderness.  Lymphadenopathy:    He has no cervical adenopathy.  Neurological: He is alert and oriented to person, place, and time.  He has normal reflexes. No cranial nerve deficit. He exhibits normal muscle tone. He displays a negative Romberg sign. Coordination abnormal. Gait normal.  Skin: Skin is warm and dry. No rash noted.  Psychiatric: He has a normal mood and affect. His behavior is normal. Judgment and thought content normal.  cane  Lab Results  Component Value Date   WBC 6.2 10/23/2017   HGB 15.4 10/23/2017   HCT 46.5 10/23/2017   PLT 137.0 (L) 10/23/2017   GLUCOSE 89 10/23/2017   CHOL 110 05/25/2013   TRIG 83.0 05/25/2013   HDL 39.00 (L) 05/25/2013   LDLCALC 54 05/25/2013   ALT 9 10/23/2017   AST 15 10/23/2017   NA 143 10/23/2017   K 3.9 10/23/2017   CL 103 10/23/2017   CREATININE 1.44 10/23/2017   BUN 21 10/23/2017   CO2 30 10/23/2017   TSH 1.573 04/07/2017   PSA 0.24 03/08/2014   INR 1.34 04/06/2017    Dg Chest 2 View  Result Date: 04/24/2017 CLINICAL DATA:  Followup rib fracture COMPARISON:  04/07/2017, 04/06/2017 FINDINGS: The previously identified rib fractures are not well appreciated on this exam. No pneumothorax is noted. Mild blunting of the right costophrenic angle is noted consistent with a small effusion. Small left effusion is also noted. No compression deformities are seen. No acute bony abnormality is noted. Significant improvement in the left basilar atelectasis is noted. No nipple shadows are noted. IMPRESSION: Small pleural effusions bilaterally. No other focal abnormality is noted. Electronically Signed   By: [name] M.D.   On: 04/24/2017 14:28    Assessment & Plan:   There are no diagnoses linked to this encounter. I am having [name] maintain his multivitamin with minerals, Multiple Vitamins-Minerals (ICAPS PO), ranitidine, pravastatin, senna, linaclotide, apixaban, furosemide, finasteride, ALPRAZolam, promethazine-codeine, and amiodarone.  No orders of the defined types were placed in this encounter.    Follow-up: No Follow-up on file.  Walker Kehr, MD

## 2017-10-24 NOTE — Assessment & Plan Note (Signed)
Furosemide prn 

## 2017-10-27 NOTE — Assessment & Plan Note (Signed)
Monitoring BMET 

## 2017-10-27 NOTE — Assessment & Plan Note (Signed)
On Pravachol 

## 2017-10-27 NOTE — Assessment & Plan Note (Signed)
Eliquis 

## 2017-12-23 ENCOUNTER — Encounter: Payer: Self-pay | Admitting: Internal Medicine

## 2017-12-23 ENCOUNTER — Ambulatory Visit: Payer: Medicare Other | Admitting: Internal Medicine

## 2017-12-23 VITALS — BP 128/76 | HR 54 | Temp 97.8°F | Ht 68.0 in | Wt 185.0 lb

## 2017-12-23 DIAGNOSIS — I5022 Chronic systolic (congestive) heart failure: Secondary | ICD-10-CM

## 2017-12-23 DIAGNOSIS — L570 Actinic keratosis: Secondary | ICD-10-CM

## 2017-12-23 DIAGNOSIS — I480 Paroxysmal atrial fibrillation: Secondary | ICD-10-CM | POA: Diagnosis not present

## 2017-12-23 DIAGNOSIS — K5900 Constipation, unspecified: Secondary | ICD-10-CM

## 2017-12-23 DIAGNOSIS — Z7901 Long term (current) use of anticoagulants: Secondary | ICD-10-CM

## 2017-12-23 DIAGNOSIS — N183 Chronic kidney disease, stage 3 unspecified: Secondary | ICD-10-CM

## 2017-12-23 MED ORDER — SENNA 8.6 MG PO TABS: 1.0000 | ORAL_TABLET | Freq: Every day | ORAL | 11 refills | Status: DC

## 2017-12-23 MED ORDER — ALPRAZOLAM 1 MG PO TABS: 1.0000 mg | ORAL_TABLET | Freq: Every day | ORAL | 1 refills | Status: DC

## 2017-12-23 MED ORDER — PROMETHAZINE-CODEINE 6.25-10 MG/5ML PO SYRP: 5.0000 mL | ORAL_SOLUTION | Freq: Four times a day (QID) | ORAL | 1 refills | Status: DC | PRN

## 2017-12-23 NOTE — Assessment & Plan Note (Signed)
Miralax prn, Linzess, Senakot prn

## 2017-12-23 NOTE — Assessment & Plan Note (Signed)
2017 on anticoagulation and amiodarone, Furosemide NAS diet

## 2017-12-23 NOTE — Progress Notes (Signed)
Subjective:  Patient ID: [name] [name], male    DOB: 08-Jan-1920  Age: 84 y.o. MRN: [id number]  CC: No chief complaint on file.   HPI [name] [name] presents for HTN, dyslipidemia, A fib f/u C/o constipation - has to use meds combo C/o chronic cough  Outpatient Medications Prior to Visit  Medication Sig Dispense Refill  . amiodarone (PACERONE) 200 MG tablet Take 0.5 tablets (100 mg total) by mouth 3 (three) times a week. Mondays, Wednesdays, and Fridays 20 tablet 7  . apixaban (ELIQUIS) 2.5 MG TABS tablet Take 1 tablet (2.5 mg total) by mouth 2 (two) times daily. 180 tablet 1  . finasteride (PROSCAR) 5 MG tablet Take 1 tablet (5 mg total) by mouth daily. 90 tablet 3  . furosemide (LASIX) 20 MG tablet Take 1 tablet (20 mg total) by mouth daily as needed for fluid or edema (use for swelling, shortness of breath). 90 tablet 1  . linaclotide (LINZESS) 290 MCG CAPS capsule Take 1 capsule (290 mcg total) by mouth daily before breakfast. 28 capsule 0  . Multiple Vitamin (MULTIVITAMIN WITH MINERALS) TABS tablet Take 1 tablet by mouth daily.    . Multiple Vitamins-Minerals (ICAPS PO) Take 1 capsule by mouth daily.    . pravastatin (PRAVACHOL) 20 MG tablet Take 1 tablet (20 mg total) by mouth daily. 30 tablet 11  . ranitidine (ZANTAC) 150 MG tablet Take 1 tablet (150 mg total) by mouth 2 (two) times daily as needed for heartburn. 180 tablet 3  . ALPRAZolam (XANAX) 1 MG tablet Take 1 tablet (1 mg total) by mouth at bedtime. 90 tablet 1  . promethazine-codeine (PHENERGAN WITH CODEINE) 6.25-10 MG/5ML syrup Take 5 mLs by mouth every 6 (six) hours as needed for cough. 300 mL 1  . senna (SENOKOT) 8.6 MG TABS tablet Take 1 tablet (8.6 mg total) by mouth at bedtime.  0   No facility-administered medications prior to visit.     ROS Review of Systems  Constitutional: Negative for appetite change, fatigue and unexpected weight change.  HENT: Negative for congestion, nosebleeds, sneezing, sore throat and  trouble swallowing.   Eyes: Negative for itching and visual disturbance.  Respiratory: Negative for cough.   Cardiovascular: Negative for chest pain, palpitations and leg swelling.  Gastrointestinal: Positive for constipation. Negative for abdominal distention, blood in stool, diarrhea and nausea.  Genitourinary: Negative for frequency and hematuria.  Musculoskeletal: Negative for back pain, gait problem, joint swelling and neck pain.  Skin: Negative for rash.  Neurological: Negative for dizziness, tremors, speech difficulty and weakness.  Psychiatric/Behavioral: Negative for agitation, dysphoric mood and sleep disturbance. The patient is not nervous/anxious.     Objective:  BP 128/76 (BP Location: Left Arm, Patient Position: Sitting, Cuff Size: Large)   Pulse (!) 54   Temp 97.8 F (36.6 C) (Oral)   Ht 5\' 8"  (1.727 m)   Wt 185 lb (83.9 kg)   SpO2 98%   BMI 28.13 kg/m   BP Readings from Last 3 Encounters:  12/23/17 128/76  10/24/17 126/74  09/19/17 134/70    Wt Readings from Last 3 Encounters:  12/23/17 185 lb (83.9 kg)  10/24/17 181 lb (82.1 kg)  09/19/17 181 lb (82.1 kg)    Physical Exam  Constitutional: He is oriented to person, place, and time. He appears well-developed. No distress.  NAD  HENT:  Mouth/Throat: Oropharynx is clear and moist.  Eyes: Conjunctivae are normal. Pupils are equal, round, and reactive to light.  Neck: Normal range of  motion. No JVD present. No thyromegaly present.  Cardiovascular: Normal rate, regular rhythm, normal heart sounds and intact distal pulses. Exam reveals no gallop and no friction rub.  No murmur heard. Pulmonary/Chest: Effort normal and breath sounds normal. No respiratory distress. He has no wheezes. He has no rales. He exhibits no tenderness.  Abdominal: Soft. Bowel sounds are normal. He exhibits no distension and no mass. There is no tenderness. There is no rebound and no guarding.  Musculoskeletal: Normal range of motion. He  exhibits no edema or tenderness.  Lymphadenopathy:    He has no cervical adenopathy.  Neurological: He is alert and oriented to person, place, and time. He has normal reflexes. No cranial nerve deficit. He exhibits normal muscle tone. He displays a negative Romberg sign. Coordination abnormal. Gait normal.  Skin: Skin is warm and dry. No rash noted.  Psychiatric: He has a normal mood and affect. His behavior is normal. Judgment and thought content normal.  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 Informed consent was obtained verbally.    3 lesion(s)  on   Scalp  was/were treated with liquid nitrogen on a Q-tip in a usual fasion . Band-Aid was applied and antibiotic ointment was given for a later use.   Tolerated well. Complications none.      Lab Results  Component Value Date   WBC 6.2 10/23/2017   HGB 15.4 10/23/2017   HCT 46.5 10/23/2017   PLT 137.0 (L) 10/23/2017   GLUCOSE 89 10/23/2017   CHOL 110 05/25/2013   TRIG 83.0 05/25/2013   HDL 39.00 (L) 05/25/2013   LDLCALC 54 05/25/2013   ALT 9 10/23/2017   AST 15 10/23/2017   NA 143 10/23/2017   K 3.9 10/23/2017   CL 103 10/23/2017   CREATININE 1.44 10/23/2017   BUN 21 10/23/2017   CO2 30 10/23/2017   TSH 1.573 04/07/2017   PSA 0.24 03/08/2014   INR 1.34 04/06/2017    Dg Chest 2 View  Result Date: 04/24/2017 CLINICAL DATA:  Followup rib fracture COMPARISON:  04/07/2017, 04/06/2017 FINDINGS: The previously identified rib fractures are not well appreciated on this exam. No pneumothorax is noted. Mild blunting of the right costophrenic angle is noted consistent with a small effusion. Small left effusion is also noted. No compression deformities are  seen. No acute bony abnormality is noted. Significant improvement in the left basilar atelectasis is noted. No nipple shadows are noted. IMPRESSION: Small pleural effusions bilaterally. No other focal abnormality is noted. Electronically Signed   By: [name] M.D.   On: 04/24/2017 14:28    Assessment & Plan:   There are no diagnoses linked to this encounter. I have changed [name] [name]'s senna. I am also having him maintain his multivitamin with minerals, Multiple Vitamins-Minerals (ICAPS PO), pravastatin, linaclotide, apixaban, amiodarone, ranitidine, finasteride, furosemide, ALPRAZolam, and promethazine-codeine.  Meds ordered this encounter  Medications  . ALPRAZolam (XANAX) 1 MG tablet    Sig: Take 1 tablet (1 mg total) by mouth at bedtime.    Dispense:  90 tablet    Refill:  1  . promethazine-codeine (PHENERGAN WITH CODEINE) 6.25-10 MG/5ML syrup    Sig: Take 5 mLs by mouth every 6 (  six) hours as needed for cough.    Dispense:  300 mL    Refill:  1  . senna (SENOKOT) 8.6 MG TABS tablet    Sig: Take 1-2 tablets (8.6-17.2 mg total) by mouth at bedtime.    Dispense:  120 each    Refill:  11     Follow-up: No Follow-up on file.  Walker Kehr, MD

## 2017-12-23 NOTE — Assessment & Plan Note (Signed)
Monitoring labs 

## 2017-12-23 NOTE — Assessment & Plan Note (Signed)
Eliquis 

## 2017-12-23 NOTE — Assessment & Plan Note (Signed)
See Cryo 

## 2017-12-23 NOTE — Patient Instructions (Addendum)
Clean ears with Kleenex Hearing aids: position the dots to your front   Postprocedure instructions :     Keep the wounds clean. You can wash them with liquid soap and water. Pat dry with gauze or a Kleenex tissue  Before applying antibiotic ointment.  You need to report immediately  if  any signs of infection develop.

## 2017-12-23 NOTE — Assessment & Plan Note (Signed)
Eliquis, Pacerone [facility]

## 2017-12-31 ENCOUNTER — Telehealth: Payer: Self-pay | Admitting: Internal Medicine

## 2018-01-01 NOTE — Telephone Encounter (Signed)
Pt states he is having issues with constipation. Reports going about 3 days with no BM. When his medication finally helps him go then he tends to have diarrhea. Pt requests to be seen. Pt scheduled to see Ellouise Newer PA 01/05/18@8 :45am. Pt aware of appt.

## 2018-01-01 NOTE — Telephone Encounter (Signed)
Attempted to return pts call but got no answer, will try again.

## 2018-01-05 ENCOUNTER — Ambulatory Visit: Payer: Medicare Other | Admitting: Physician Assistant

## 2018-01-05 ENCOUNTER — Encounter: Payer: Self-pay | Admitting: Physician Assistant

## 2018-01-05 VITALS — BP 134/66 | HR 60 | Ht 68.0 in | Wt 182.4 lb

## 2018-01-05 DIAGNOSIS — K5909 Other constipation: Secondary | ICD-10-CM

## 2018-01-05 NOTE — Progress Notes (Addendum)
Chief Complaint: Chronic constipation  HPI:    [name] [name] is a 84 year old male with a history of CAD, cardiomyopathy, A. fib on Eliquis, hypertension, hyperlipidemia and constipation who returns to clinic today for follow-up.    Patient has been following with Dr. Hilarie Fredrickson last seen 06/30/17 for his chronic constipation.  He reported being better with Linzess 290 mcg daily but still having some constipation.  For him that sound like straining at the beginning os a bowel movement.  MiraLAX was added on a regular basis, the patient also wanted to add back Senokot which had worked for him in the past.  He was to take Linzess 290 mcg 30 minutes before breakfast and one Senokot tablet at bedtime.    Today, patient reports that he is taking Linzess 290 mcg in the a.m. and 2 spoonfuls of MiraLAX at night and this only sometimes works.  He will also use an occasional Senokot if he starts to feel bloated/constipated after 1 or 2 days of not having a bowel movement.  He does occasionally have some regular stools and then will occasionally have watery diarrhea.  Unsure if related to use of Senokot.  Admits he does not drink a lot of water.  Associated symptoms include bloating and lower abdominal tightness/discomfort with days of constipation.  Also trouble with hemorrhoids occasionally.  Not currently.    Patient denies fever, chills, blood in his stool, melena, weight loss, nausea or vomiting.  Past Medical History:  Diagnosis Date  . Anxiety   . Arthritis    "shoulders" (05/28/2016)  . Cardiomyopathy   . Coronary artery disease   . Depression    hx (05/28/2016)  . Hemorrhoids   . Hyperlipidemia   . Hypertension   . Skipped heart beats     Past Surgical History:  Procedure Laterality Date  . APPENDECTOMY    . CARDIOVERSION N/A 06/04/2016   Procedure: CARDIOVERSION;  Surgeon: Pixie Casino, MD;  Location: [facility] ENDOSCOPY;  Service: Cardiovascular;  Laterality: N/A;  . CARDIOVERSION N/A 04/09/2017   Procedure: CARDIOVERSION;  Surgeon: Thayer Headings, MD;  Location: [place];  Service: Cardiovascular;  Laterality: N/A;  . CATARACT EXTRACTION Left   . CHOLECYSTECTOMY OPEN    . COLONOSCOPY W/ BIOPSIES AND POLYPECTOMY    . INGUINAL HERNIA REPAIR Bilateral   . MYRINGOTOMY WITH TUBE PLACEMENT Bilateral   . skin cancer removal Right    side of nose by Rt eye  . TEE WITHOUT CARDIOVERSION N/A 06/04/2016   Procedure: TRANSESOPHAGEAL ECHOCARDIOGRAM (TEE);  Surgeon: Pixie Casino, MD;  [facility] ENDOSCOPY;  Service: Cardiovascular;  Laterality: N/A;  . TONSILLECTOMY AND ADENOIDECTOMY      Current Outpatient Medications  Medication Sig Dispense Refill  . ALPRAZolam (XANAX) 1 MG tablet Take 1 tablet (1 mg total) by mouth at bedtime. 90 tablet 1  . amiodarone (PACERONE) 200 MG tablet Take 0.5 tablets (100 mg total) by mouth 3 (three) times a week. Mondays, Wednesdays, and Fridays 20 tablet 7  . apixaban (ELIQUIS) 2.5 MG TABS tablet Take 1 tablet (2.5 mg total) by mouth 2 (two) times daily. 180 tablet 1  . finasteride (PROSCAR) 5 MG tablet Take 1 tablet (5 mg total) by mouth daily. 90 tablet 3  . furosemide (LASIX) 20 MG tablet Take 1 tablet (20 mg total) by mouth daily as needed for fluid or edema (use for swelling, shortness of breath). 90 tablet 1  . linaclotide (LINZESS) 290 MCG CAPS capsule Take 1 capsule (290  mcg total) by mouth daily before breakfast. 28 capsule 0  . polyethylene glycol (MIRALAX / GLYCOLAX) packet Take 17 g by mouth at bedtime.    . pravastatin (PRAVACHOL) 20 MG tablet Take 1 tablet (20 mg total) by mouth daily. 30 tablet 11  . promethazine-codeine (PHENERGAN WITH CODEINE) 6.25-10 MG/5ML syrup Take 5 mLs by mouth every 6 (six) hours as needed for cough. 300 mL 1  . senna (SENOKOT) 8.6 MG TABS tablet Take 1-2 tablets (8.6-17.2 mg total) by mouth at bedtime. 120 each 11  . ranitidine (ZANTAC) 150 MG tablet Take 1 tablet (150 mg total) by mouth 2 (two) times daily as  needed for heartburn. (Patient not taking: Reported on 01/05/2018) 180 tablet 3   No current facility-administered medications for this visit.     Allergies as of 01/05/2018 - Review Complete 01/05/2018  Allergen Reaction Noted  . Lisinopril Cough 03/02/2012    Family History  Problem Relation Age of Onset  . Heart disease Brother     Social History   Socioeconomic History  . Marital status: Widowed    Spouse name: Not on file  . Number of children: 1  . Years of education: Not on file  . Highest education level: Not on file  Social Needs  . Financial resource strain: Not on file  . Food insecurity - worry: Not on file  . Food insecurity - inability: Not on file  . Transportation needs - medical: Not on file  . Transportation needs - non-medical: Not on file  Occupational History  . Occupation: Retired  Tobacco Use  . Smoking status: Never Smoker  . Smokeless tobacco: Never Used  Substance and Sexual Activity  . Alcohol use: No  . Drug use: No  . Sexual activity: Yes  Other Topics Concern  . Not on file  Social History Narrative  . Not on file    Review of Systems:    Constitutional: No weight loss, fever or chills Cardiovascular: No chest pain Respiratory: No SOB Gastrointestinal: See HPI and otherwise negative   Physical Exam:  Vital signs: BP 134/66   Pulse 60   Ht 5\' 8"  (1.727 m)   Wt 182 lb 6 oz (82.7 kg)   BMI 27.73 kg/m   Constitutional:   Pleasant elderly Caucasian male appears to be in NAD, Well developed, Well nourished, alert and cooperative Respiratory: Respirations even and unlabored. Lungs clear to auscultation bilaterally.   No wheezes, crackles, or rhonchi.  Cardiovascular: Normal S1, S2. No MRG. Regular rate and rhythm. No peripheral edema, cyanosis or pallor.  Gastrointestinal:  Soft, nondistended, nontender. No rebound or guarding. Normal bowel sounds. No appreciable masses or hepatomegaly. Psychiatric: Demonstrates good judgement and  reason without abnormal affect or behaviors.  MOST RECENT LABS AND IMAGING: CBC    Component Value Date/Time   WBC 6.2 10/23/2017 1142   RBC 4.82 10/23/2017 1142   HGB 15.4 10/23/2017 1142   HCT 46.5 10/23/2017 1142   PLT 137.0 (L) 10/23/2017 1142   MCV 96.4 10/23/2017 1142   MCH 29.9 04/13/2017 0521   MCHC 33.2 10/23/2017 1142   RDW 14.0 10/23/2017 1142   LYMPHSABS 1.1 10/23/2017 1142   MONOABS 0.6 10/23/2017 1142   EOSABS 0.1 10/23/2017 1142   BASOSABS 0.1 10/23/2017 1142    CMP     Component Value Date/Time   NA 143 10/23/2017 1142   NA 142 08/12/2017 1043   K 3.9 10/23/2017 1142   CL 103 10/23/2017 1142  CO2 30 10/23/2017 1142   GLUCOSE 89 10/23/2017 1142   BUN 21 10/23/2017 1142   BUN 24 08/12/2017 1043   CREATININE 1.44 10/23/2017 1142   CREATININE 1.56 (H) 06/25/2016 1033   CALCIUM 8.9 10/23/2017 1142   PROT 6.8 10/23/2017 1142   ALBUMIN 4.0 10/23/2017 1142   AST 15 10/23/2017 1142   ALT 9 10/23/2017 1142   ALKPHOS 86 10/23/2017 1142   BILITOT 1.2 10/23/2017 1142   GFRNONAA 30 (L) 08/12/2017 1043   GFRAA 35 (L) 08/12/2017 1043    Assessment: 1.  Chronic constipation: Improvement with Linzess 290 mcg daily but not resolution, addition of 2 spoonfuls of MiraLAX at night only "sometimes works", occasionally uses Senokot, describes occasional watery loose stools  Plan: 1.  Discussed possible trial of Amitiza but patient would like to stick with Linzess today. 2.  Continue Linzess 290 mcg every morning, 30 minutes before breakfast. 3.  Discussed MiraLAX usage and titration of this.  He will add one spoonful of MiraLAX mixed in 6 ounces of water in the morning with his Linzess and continue two spoonfuls at night.  Discussed that he can add in another spoonful in the morning and another dose in the afternoon if this is not working for him.  Discussed that these medications work better if he is drinking water.  Encouraged him to increase his water intake to at least  6 eight ounce glasses of water per day. 4.  Patient will call and let us know how he is doing. 5.  Patient to return to clinic with me or Dr. Hilarie Fredrickson as needed.  Ellouise Newer, PA-C [facility] 01/05/2018, 9:09 AM  Cc: Cassandria Anger, MD   Addendum: Reviewed and agree with management. Pyrtle, Lajuan Lines, MD

## 2018-01-05 NOTE — Patient Instructions (Addendum)
Use Linzess 290 mg 30 minutes before breakfast.   Take Miralax one spoonful in the morning with 6 oz of water and two in the PM in 6 oz of water. Can increase up to 4x/day if needed.  Please call the office with an update of your symptoms in the next one week and ask for Katina Degree, RN.
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
## 2018-01-14 ENCOUNTER — Ambulatory Visit: Payer: Medicare Other | Admitting: Cardiology

## 2018-01-14 ENCOUNTER — Encounter: Payer: Self-pay | Admitting: Cardiology

## 2018-01-14 VITALS — BP 116/62 | HR 56 | Ht 67.0 in | Wt 188.0 lb

## 2018-01-14 DIAGNOSIS — I480 Paroxysmal atrial fibrillation: Secondary | ICD-10-CM

## 2018-01-14 DIAGNOSIS — I10 Essential (primary) hypertension: Secondary | ICD-10-CM | POA: Diagnosis not present

## 2018-01-14 DIAGNOSIS — I5022 Chronic systolic (congestive) heart failure: Secondary | ICD-10-CM

## 2018-01-14 MED ORDER — APIXABAN 2.5 MG PO TABS: 2.5000 mg | ORAL_TABLET | Freq: Two times a day (BID) | ORAL | 11 refills | Status: DC

## 2018-01-14 NOTE — Patient Instructions (Signed)

## 2018-02-13 DIAGNOSIS — D485 Neoplasm of uncertain behavior of skin: Secondary | ICD-10-CM | POA: Diagnosis not present

## 2018-02-13 DIAGNOSIS — C4442 Squamous cell carcinoma of skin of scalp and neck: Secondary | ICD-10-CM | POA: Diagnosis not present

## 2018-02-13 DIAGNOSIS — D225 Melanocytic nevi of trunk: Secondary | ICD-10-CM | POA: Diagnosis not present

## 2018-02-13 DIAGNOSIS — L570 Actinic keratosis: Secondary | ICD-10-CM | POA: Diagnosis not present

## 2018-02-13 DIAGNOSIS — L821 Other seborrheic keratosis: Secondary | ICD-10-CM | POA: Diagnosis not present

## 2018-02-20 ENCOUNTER — Telehealth: Payer: Self-pay | Admitting: Internal Medicine

## 2018-02-20 NOTE — Telephone Encounter (Signed)
Routing to dr plotnikov, please advise, thanks 

## 2018-02-20 NOTE — Telephone Encounter (Signed)
Patient requesting promethazine Codeine for refill.  Please follow up in regard.

## 2018-02-23 MED ORDER — PROMETHAZINE-CODEINE 6.25-10 MG/5ML PO SYRP: 5.0000 mL | ORAL_SOLUTION | Freq: Four times a day (QID) | ORAL | 1 refills | Status: DC | PRN

## 2018-02-23 NOTE — Telephone Encounter (Signed)
Ok to refill Thx 

## 2018-02-23 NOTE — Telephone Encounter (Signed)
Ok Thx 

## 2018-02-23 NOTE — Telephone Encounter (Signed)
Routing to dr plotnikov, please send rx in----thanks

## 2018-02-25 DIAGNOSIS — Z85828 Personal history of other malignant neoplasm of skin: Secondary | ICD-10-CM | POA: Diagnosis not present

## 2018-02-25 DIAGNOSIS — C4442 Squamous cell carcinoma of skin of scalp and neck: Secondary | ICD-10-CM | POA: Diagnosis not present

## 2018-02-25 DIAGNOSIS — L570 Actinic keratosis: Secondary | ICD-10-CM | POA: Diagnosis not present

## 2018-03-11 ENCOUNTER — Ambulatory Visit: Payer: Medicare Other | Admitting: Family

## 2018-03-11 ENCOUNTER — Encounter: Payer: Self-pay | Admitting: Family

## 2018-03-11 ENCOUNTER — Ambulatory Visit (INDEPENDENT_AMBULATORY_CARE_PROVIDER_SITE_OTHER)
Admission: RE | Admit: 2018-03-11 | Discharge: 2018-03-11 | Disposition: A | Discharge status: Home / Self Care | Payer: Medicare Other | Source: Ambulatory Visit | Attending: Family | Admitting: Family

## 2018-03-11 VITALS — BP 108/72 | HR 66 | Temp 98.0°F | Ht 67.0 in | Wt 192.1 lb

## 2018-03-11 DIAGNOSIS — R058 Other specified cough: Secondary | ICD-10-CM

## 2018-03-11 DIAGNOSIS — R05 Cough: Secondary | ICD-10-CM

## 2018-03-11 MED ORDER — AMOXICILLIN-POT CLAVULANATE 875-125 MG PO TABS: 1.0000 | ORAL_TABLET | Freq: Two times a day (BID) | ORAL | 0 refills | Status: DC

## 2018-03-11 MED ORDER — BENZONATATE 100 MG PO CAPS: 100.0000 mg | ORAL_CAPSULE | Freq: Three times a day (TID) | ORAL | 0 refills | Status: DC | PRN
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
## 2018-03-12 NOTE — Progress Notes (Signed)
He should come in around 5/8- 5/10 for his 2 week follow up.  Thanks

## 2018-03-16 DIAGNOSIS — Z961 Presence of intraocular lens: Secondary | ICD-10-CM | POA: Diagnosis not present

## 2018-03-16 DIAGNOSIS — H2521 Age-related cataract, morgagnian type, right eye: Secondary | ICD-10-CM | POA: Diagnosis not present

## 2018-03-16 DIAGNOSIS — H524 Presbyopia: Secondary | ICD-10-CM | POA: Diagnosis not present

## 2018-03-16 DIAGNOSIS — H353121 Nonexudative age-related macular degeneration, left eye, early dry stage: Secondary | ICD-10-CM | POA: Diagnosis not present

## 2018-03-25 ENCOUNTER — Encounter: Payer: Self-pay | Admitting: Internal Medicine

## 2018-03-25 ENCOUNTER — Other Ambulatory Visit (INDEPENDENT_AMBULATORY_CARE_PROVIDER_SITE_OTHER): Payer: Medicare Other

## 2018-03-25 ENCOUNTER — Ambulatory Visit: Payer: Medicare Other | Admitting: Internal Medicine

## 2018-03-25 VITALS — BP 118/72 | HR 48 | Temp 98.4°F | Ht 67.0 in | Wt 195.0 lb

## 2018-03-25 DIAGNOSIS — R0600 Dyspnea, unspecified: Secondary | ICD-10-CM

## 2018-03-25 DIAGNOSIS — I480 Paroxysmal atrial fibrillation: Secondary | ICD-10-CM | POA: Diagnosis not present

## 2018-03-25 DIAGNOSIS — I5022 Chronic systolic (congestive) heart failure: Secondary | ICD-10-CM | POA: Diagnosis not present

## 2018-03-25 DIAGNOSIS — J449 Chronic obstructive pulmonary disease, unspecified: Secondary | ICD-10-CM

## 2018-03-25 DIAGNOSIS — J181 Lobar pneumonia, unspecified organism: Secondary | ICD-10-CM

## 2018-03-25 DIAGNOSIS — R0609 Other forms of dyspnea: Secondary | ICD-10-CM | POA: Diagnosis not present

## 2018-03-25 DIAGNOSIS — J189 Pneumonia, unspecified organism: Secondary | ICD-10-CM

## 2018-03-25 DIAGNOSIS — J159 Unspecified bacterial pneumonia: Secondary | ICD-10-CM | POA: Insufficient documentation

## 2018-03-25 DIAGNOSIS — I420 Dilated cardiomyopathy: Secondary | ICD-10-CM

## 2018-03-25 LAB — CBC WITH DIFFERENTIAL/PLATELET
BASOS PCT: 0.4 % (ref 0.0–3.0)
Basophils Absolute: 0 10*3/uL (ref 0.0–0.1)
EOS PCT: 1 % (ref 0.0–5.0)
Eosinophils Absolute: 0.1 10*3/uL (ref 0.0–0.7)
HEMATOCRIT: 50 % (ref 39.0–52.0)
HEMOGLOBIN: 16.3 g/dL (ref 13.0–17.0)
LYMPHS PCT: 17.5 % (ref 12.0–46.0)
Lymphs Abs: 1.1 10*3/uL (ref 0.7–4.0)
MCHC: 32.5 g/dL (ref 30.0–36.0)
MCV: 97.7 fl (ref 78.0–100.0)
Monocytes Absolute: 0.8 10*3/uL (ref 0.1–1.0)
Monocytes Relative: 12 % (ref 3.0–12.0)
Neutro Abs: 4.5 10*3/uL (ref 1.4–7.7)
Neutrophils Relative %: 69.1 % (ref 43.0–77.0)
Platelets: 110 10*3/uL — ABNORMAL LOW (ref 150.0–400.0)
RBC: 5.11 Mil/uL (ref 4.22–5.81)
RDW: 16.1 % — AB (ref 11.5–15.5)
WBC: 6.5 10*3/uL (ref 4.0–10.5)

## 2018-03-25 LAB — HEPATIC FUNCTION PANEL
ALT: 26 U/L (ref 0–53)
AST: 23 U/L (ref 0–37)
Albumin: 3.7 g/dL (ref 3.5–5.2)
Alkaline Phosphatase: 79 U/L (ref 39–117)
BILIRUBIN DIRECT: 0.4 mg/dL — AB (ref 0.0–0.3)
TOTAL PROTEIN: 6.4 g/dL (ref 6.0–8.3)
Total Bilirubin: 1.6 mg/dL — ABNORMAL HIGH (ref 0.2–1.2)

## 2018-03-25 LAB — BASIC METABOLIC PANEL
BUN: 25 mg/dL — ABNORMAL HIGH (ref 6–23)
CHLORIDE: 104 meq/L (ref 96–112)
CO2: 29 mEq/L (ref 19–32)
Calcium: 8.9 mg/dL (ref 8.4–10.5)
Creatinine, Ser: 1.64 mg/dL — ABNORMAL HIGH (ref 0.40–1.50)
GFR: 41.59 mL/min — AB (ref >60.00)
Glucose, Bld: 93 mg/dL (ref 70–99)
Potassium: 4.4 mEq/L (ref 3.5–5.1)
SODIUM: 143 meq/L (ref 135–145)

## 2018-03-25 LAB — TSH: TSH: 3 u[IU]/mL (ref 0.35–4.50)

## 2018-03-25 MED ORDER — PROMETHAZINE-CODEINE 6.25-10 MG/5ML PO SYRP: 5.0000 mL | ORAL_SOLUTION | Freq: Four times a day (QID) | ORAL | 1 refills | Status: DC | PRN

## 2018-03-25 MED ORDER — METHYLPREDNISOLONE ACETATE 80 MG/ML IJ SUSP
80.0000 mg | Freq: Once | INTRAMUSCULAR | Status: AC
  Administered 2018-03-25: 80 mg via INTRAMUSCULAR

## 2018-03-25 NOTE — Assessment & Plan Note (Signed)
Furosemide 3/wk

## 2018-03-25 NOTE — Assessment & Plan Note (Signed)
[place] [facility] ref Labs

## 2018-03-25 NOTE — Assessment & Plan Note (Signed)
Eliquis Rate controlled 

## 2018-03-25 NOTE — Progress Notes (Signed)
Subjective:  Patient ID: [name] [name], male    DOB: 12-Aug-1920  Age: 85 y.o. MRN: [id number]  CC: No chief complaint on file.   HPI [name] [name] presents for a RLL pneumonia in April - recovering slow. C/o fatigue, weakness. F/u anxiety... [facility] helps him at home   Outpatient Medications Prior to Visit  Medication Sig Dispense Refill  . ALPRAZolam (XANAX) 1 MG tablet Take 1 tablet (1 mg total) by mouth at bedtime. 90 tablet 1  . amiodarone (PACERONE) 200 MG tablet Take 0.5 tablets (100 mg total) by mouth 3 (three) times a week. Mondays, Wednesdays, and Fridays 20 tablet 7  . apixaban (ELIQUIS) 2.5 MG TABS tablet Take 1 tablet (2.5 mg total) by mouth 2 (two) times daily. 60 tablet 11  . benzonatate (TESSALON) 100 MG capsule Take 1 capsule (100 mg total) by mouth 3 (three) times daily as needed. 20 capsule 0  . finasteride (PROSCAR) 5 MG tablet Take 1 tablet (5 mg total) by mouth daily. 90 tablet 3  . furosemide (LASIX) 20 MG tablet Take 1 tablet (20 mg total) by mouth daily as needed for fluid or edema (use for swelling, shortness of breath). 90 tablet 1  . linaclotide (LINZESS) 290 MCG CAPS capsule Take 1 capsule (290 mcg total) by mouth daily before breakfast. 28 capsule 0  . polyethylene glycol (MIRALAX / GLYCOLAX) packet Take 17 g by mouth at bedtime.    . pravastatin (PRAVACHOL) 20 MG tablet Take 1 tablet (20 mg total) by mouth daily. 30 tablet 11  . promethazine-codeine (PHENERGAN WITH CODEINE) 6.25-10 MG/5ML syrup Take 5 mLs by mouth every 6 (six) hours as needed for cough. 300 mL 1  . ranitidine (ZANTAC) 150 MG tablet Take 1 tablet (150 mg total) by mouth 2 (two) times daily as needed for heartburn. 180 tablet 3  . senna (SENOKOT) 8.6 MG TABS tablet Take 1-2 tablets (8.6-17.2 mg total) by mouth at bedtime. 120 each 11  . VENTOLIN HFA 108 (90 Base) MCG/ACT inhaler   0  . amoxicillin-clavulanate (AUGMENTIN) 875-125 MG tablet Take 1 tablet by mouth 2 (two) times daily.  (Patient not taking: Reported on 03/25/2018) 20 tablet 0   No facility-administered medications prior to visit.     ROS Review of Systems  Constitutional: Positive for fatigue. Negative for appetite change and unexpected weight change.  HENT: Negative for congestion, nosebleeds, sneezing, sore throat and trouble swallowing.   Eyes: Negative for itching and visual disturbance.  Respiratory: Positive for cough and shortness of breath. Negative for wheezing.   Cardiovascular: Negative for chest pain, palpitations and leg swelling.  Gastrointestinal: Negative for abdominal distention, blood in stool, diarrhea and nausea.  Genitourinary: Negative for frequency and hematuria.  Musculoskeletal: Negative for back pain, gait problem, joint swelling and neck pain.  Skin: Negative for rash.  Neurological: Positive for weakness. Negative for dizziness, tremors and speech difficulty.  Psychiatric/Behavioral: Negative for agitation, dysphoric mood and sleep disturbance. The patient is not nervous/anxious.     Objective:  BP 118/72 (BP Location: Left Arm, Patient Position: Sitting, Cuff Size: Large)   Pulse (!) 48   Temp 98.4 F (36.9 C) (Oral)   Ht 5\' 7"  (1.702 m)   Wt 195 lb (88.5 kg)   SpO2 91%   BMI 30.54 kg/m   BP Readings from Last 3 Encounters:  03/25/18 118/72  03/11/18 108/72  01/14/18 116/62    Wt Readings from Last 3 Encounters:  03/25/18 195 lb (88.5 kg)  03/11/18 192 lb 1.3 oz (87.1 kg)  01/14/18 188 lb (85.3 kg)    Physical Exam  Constitutional: He is oriented to person, place, and time. He appears well-developed. No distress.  NAD  HENT:  Mouth/Throat: Oropharynx is clear and moist.  Eyes: Pupils are equal, round, and reactive to light. Conjunctivae are normal.  Neck: Normal range of motion. No JVD present. No thyromegaly present.  Cardiovascular: Normal rate, regular rhythm, normal heart sounds and intact distal pulses. Exam reveals no gallop and no friction rub.  No  murmur heard. Pulmonary/Chest: Effort normal. No respiratory distress. He has wheezes. He has no rales. He exhibits no tenderness.  Abdominal: Soft. Bowel sounds are normal. He exhibits no distension and no mass. There is no tenderness. There is no rebound and no guarding.  Musculoskeletal: Normal range of motion. He exhibits edema. He exhibits no tenderness.  Lymphadenopathy:    He has no cervical adenopathy.  Neurological: He is alert and oriented to person, place, and time. He has normal reflexes. No cranial nerve deficit. He exhibits normal muscle tone. He displays a negative Romberg sign. Coordination and gait normal.  Skin: Skin is warm and dry. No rash noted.  Psychiatric: He has a normal mood and affect. His behavior is normal. Judgment and thought content normal.   Moves slowly Cane Trace to 1+ edema at the ankles Rare wheezing right base   I personally provided Albuterol inhaler w/a spacer use teaching. After the teaching patient was able to demonstrate it's use effectively. All questions were answered  Lab Results  Component Value Date   WBC 6.2 10/23/2017   HGB 15.4 10/23/2017   HCT 46.5 10/23/2017   PLT 137.0 (L) 10/23/2017   GLUCOSE 89 10/23/2017   CHOL 110 05/25/2013   TRIG 83.0 05/25/2013   HDL 39.00 (L) 05/25/2013   LDLCALC 54 05/25/2013   ALT 9 10/23/2017   AST 15 10/23/2017   NA 143 10/23/2017   K 3.9 10/23/2017   CL 103 10/23/2017   CREATININE 1.44 10/23/2017   BUN 21 10/23/2017   CO2 30 10/23/2017   TSH 1.573 04/07/2017   PSA 0.24 03/08/2014   INR 1.34 04/06/2017    Dg Chest 2 View  Result Date: 03/11/2018 CLINICAL DATA:  Cough 3 weeks EXAM: CHEST - 2 VIEW COMPARISON:  04/25/2017 FINDINGS: Cardiac enlargement without pulmonary edema. Small bilateral effusions similar to the prior study. Right lower lobe airspace disease has progressed since the prior study and may be atelectasis or pneumonia Advanced degenerative change in both shoulders IMPRESSION:  Small bilateral effusions unchanged. Progressive right lower lobe atelectasis/pneumonia. Electronically Signed   By: Franchot Gallo M.D.   On: 03/11/2018 16:36    Assessment & Plan:   There are no diagnoses linked to this encounter. I am having [name] maintain his pravastatin, linaclotide, amiodarone, ranitidine, finasteride, furosemide, ALPRAZolam, senna, polyethylene glycol, apixaban, promethazine-codeine, amoxicillin-clavulanate, benzonatate, and VENTOLIN HFA.  No orders of the defined types were placed in this encounter.    Follow-up: No follow-ups on file.  Walker Kehr, MD

## 2018-03-25 NOTE — Addendum Note (Signed)
Addended by: Karren Cobble on: 03/25/2018 03:18 PM   Modules accepted: Orders

## 2018-03-25 NOTE — Assessment & Plan Note (Signed)
Labs NAS diet 

## 2018-03-25 NOTE — Assessment & Plan Note (Signed)
DOE/wheezing post-CAP --- Depo-Medrol 80 mg IM Albuterol MDI

## 2018-03-30 ENCOUNTER — Inpatient Hospital Stay (HOSPITAL_COMMUNITY)
Admission: EM | Admit: 2018-03-30 | Discharge: 2018-04-04 | DRG: 291 | Disposition: A | Discharge status: Home Health | Payer: Medicare Other | Attending: Internal Medicine | Admitting: Internal Medicine

## 2018-03-30 ENCOUNTER — Emergency Department (HOSPITAL_COMMUNITY): Payer: Medicare Other

## 2018-03-30 ENCOUNTER — Encounter (HOSPITAL_COMMUNITY): Payer: Self-pay | Admitting: *Deleted

## 2018-03-30 ENCOUNTER — Emergency Department (HOSPITAL_COMMUNITY)
Admit: 2018-03-30 | Discharge: 2018-03-30 | Disposition: A | Discharge status: Home / Self Care | Payer: Medicare Other | Attending: Emergency Medicine | Admitting: Emergency Medicine

## 2018-03-30 DIAGNOSIS — E875 Hyperkalemia: Secondary | ICD-10-CM | POA: Diagnosis present

## 2018-03-30 DIAGNOSIS — R404 Transient alteration of awareness: Secondary | ICD-10-CM | POA: Diagnosis not present

## 2018-03-30 DIAGNOSIS — E876 Hypokalemia: Secondary | ICD-10-CM | POA: Diagnosis present

## 2018-03-30 DIAGNOSIS — I4891 Unspecified atrial fibrillation: Secondary | ICD-10-CM

## 2018-03-30 DIAGNOSIS — R0602 Shortness of breath: Secondary | ICD-10-CM | POA: Diagnosis present

## 2018-03-30 DIAGNOSIS — N183 Chronic kidney disease, stage 3 unspecified: Secondary | ICD-10-CM | POA: Diagnosis present

## 2018-03-30 DIAGNOSIS — I472 Ventricular tachycardia: Secondary | ICD-10-CM | POA: Diagnosis present

## 2018-03-30 DIAGNOSIS — K761 Chronic passive congestion of liver: Secondary | ICD-10-CM | POA: Diagnosis not present

## 2018-03-30 DIAGNOSIS — Z888 Allergy status to other drugs, medicaments and biological substances status: Secondary | ICD-10-CM

## 2018-03-30 DIAGNOSIS — K219 Gastro-esophageal reflux disease without esophagitis: Secondary | ICD-10-CM | POA: Diagnosis present

## 2018-03-30 DIAGNOSIS — F329 Major depressive disorder, single episode, unspecified: Secondary | ICD-10-CM | POA: Diagnosis present

## 2018-03-30 DIAGNOSIS — I313 Pericardial effusion (noninflammatory): Secondary | ICD-10-CM | POA: Diagnosis not present

## 2018-03-30 DIAGNOSIS — I480 Paroxysmal atrial fibrillation: Secondary | ICD-10-CM | POA: Diagnosis present

## 2018-03-30 DIAGNOSIS — I482 Chronic atrial fibrillation, unspecified: Secondary | ICD-10-CM

## 2018-03-30 DIAGNOSIS — H5461 Unqualified visual loss, right eye, normal vision left eye: Secondary | ICD-10-CM | POA: Diagnosis present

## 2018-03-30 DIAGNOSIS — M19011 Primary osteoarthritis, right shoulder: Secondary | ICD-10-CM | POA: Diagnosis present

## 2018-03-30 DIAGNOSIS — I340 Nonrheumatic mitral (valve) insufficiency: Secondary | ICD-10-CM | POA: Diagnosis not present

## 2018-03-30 DIAGNOSIS — I481 Persistent atrial fibrillation: Secondary | ICD-10-CM | POA: Diagnosis present

## 2018-03-30 DIAGNOSIS — Z8701 Personal history of pneumonia (recurrent): Secondary | ICD-10-CM

## 2018-03-30 DIAGNOSIS — D696 Thrombocytopenia, unspecified: Secondary | ICD-10-CM | POA: Diagnosis not present

## 2018-03-30 DIAGNOSIS — I5022 Chronic systolic (congestive) heart failure: Secondary | ICD-10-CM | POA: Diagnosis present

## 2018-03-30 DIAGNOSIS — I420 Dilated cardiomyopathy: Secondary | ICD-10-CM | POA: Diagnosis present

## 2018-03-30 DIAGNOSIS — M7989 Other specified soft tissue disorders: Secondary | ICD-10-CM | POA: Diagnosis not present

## 2018-03-30 DIAGNOSIS — I452 Bifascicular block: Secondary | ICD-10-CM | POA: Diagnosis present

## 2018-03-30 DIAGNOSIS — I371 Nonrheumatic pulmonary valve insufficiency: Secondary | ICD-10-CM | POA: Diagnosis not present

## 2018-03-30 DIAGNOSIS — K5900 Constipation, unspecified: Secondary | ICD-10-CM | POA: Diagnosis present

## 2018-03-30 DIAGNOSIS — E785 Hyperlipidemia, unspecified: Secondary | ICD-10-CM | POA: Diagnosis present

## 2018-03-30 DIAGNOSIS — R001 Bradycardia, unspecified: Secondary | ICD-10-CM | POA: Diagnosis not present

## 2018-03-30 DIAGNOSIS — M19012 Primary osteoarthritis, left shoulder: Secondary | ICD-10-CM | POA: Diagnosis present

## 2018-03-30 DIAGNOSIS — I081 Rheumatic disorders of both mitral and tricuspid valves: Secondary | ICD-10-CM | POA: Diagnosis not present

## 2018-03-30 DIAGNOSIS — I959 Hypotension, unspecified: Secondary | ICD-10-CM | POA: Diagnosis not present

## 2018-03-30 DIAGNOSIS — I2510 Atherosclerotic heart disease of native coronary artery without angina pectoris: Secondary | ICD-10-CM | POA: Diagnosis present

## 2018-03-30 DIAGNOSIS — I5023 Acute on chronic systolic (congestive) heart failure: Secondary | ICD-10-CM | POA: Diagnosis not present

## 2018-03-30 DIAGNOSIS — R3121 Asymptomatic microscopic hematuria: Secondary | ICD-10-CM | POA: Diagnosis present

## 2018-03-30 DIAGNOSIS — I130 Hypertensive heart and chronic kidney disease with heart failure and stage 1 through stage 4 chronic kidney disease, or unspecified chronic kidney disease: Principal | ICD-10-CM | POA: Diagnosis present

## 2018-03-30 DIAGNOSIS — N400 Enlarged prostate without lower urinary tract symptoms: Secondary | ICD-10-CM | POA: Diagnosis present

## 2018-03-30 DIAGNOSIS — Z7901 Long term (current) use of anticoagulants: Secondary | ICD-10-CM

## 2018-03-30 DIAGNOSIS — I10 Essential (primary) hypertension: Secondary | ICD-10-CM | POA: Diagnosis not present

## 2018-03-30 DIAGNOSIS — F419 Anxiety disorder, unspecified: Secondary | ICD-10-CM | POA: Diagnosis present

## 2018-03-30 DIAGNOSIS — J90 Pleural effusion, not elsewhere classified: Secondary | ICD-10-CM | POA: Diagnosis not present

## 2018-03-30 DIAGNOSIS — Z8249 Family history of ischemic heart disease and other diseases of the circulatory system: Secondary | ICD-10-CM

## 2018-03-30 DIAGNOSIS — R531 Weakness: Secondary | ICD-10-CM | POA: Diagnosis not present

## 2018-03-30 DIAGNOSIS — Z79899 Other long term (current) drug therapy: Secondary | ICD-10-CM

## 2018-03-30 LAB — COMPREHENSIVE METABOLIC PANEL
ALT: 34 U/L (ref 17–63)
AST: 30 U/L (ref 15–41)
Albumin: 3.7 g/dL (ref 3.5–5.0)
Alkaline Phosphatase: 81 U/L (ref 38–126)
Anion gap: 13 (ref 5–15)
BUN: 31 mg/dL — ABNORMAL HIGH (ref 6–20)
CHLORIDE: 103 mmol/L (ref 101–111)
CO2: 28 mmol/L (ref 22–32)
CREATININE: 1.87 mg/dL — AB (ref 0.61–1.24)
Calcium: 9 mg/dL (ref 8.9–10.3)
GFR calc non Af Amer: 29 mL/min — ABNORMAL LOW (ref >60)
GFR, EST AFRICAN AMERICAN: 33 mL/min — AB (ref >60)
Glucose, Bld: 104 mg/dL — ABNORMAL HIGH (ref 65–99)
POTASSIUM: 4.6 mmol/L (ref 3.5–5.1)
SODIUM: 144 mmol/L (ref 135–145)
Total Bilirubin: 2.6 mg/dL — ABNORMAL HIGH (ref 0.3–1.2)
Total Protein: 6.7 g/dL (ref 6.5–8.1)

## 2018-03-30 LAB — URINALYSIS, ROUTINE W REFLEX MICROSCOPIC
Bacteria, UA: NONE SEEN
Bilirubin Urine: NEGATIVE
Glucose, UA: NEGATIVE mg/dL
Ketones, ur: NEGATIVE mg/dL
Leukocytes, UA: NEGATIVE
NITRITE: NEGATIVE
PROTEIN: NEGATIVE mg/dL
SPECIFIC GRAVITY, URINE: 1.013 (ref 1.005–1.030)
pH: 5 (ref 5.0–8.0)

## 2018-03-30 LAB — LIPID PANEL
CHOLESTEROL: 112 mg/dL (ref 0–200)
HDL: 39 mg/dL — ABNORMAL LOW (ref >40)
LDL CALC: 57 mg/dL (ref 0–99)
Total CHOL/HDL Ratio: 2.9 RATIO
Triglycerides: 80 mg/dL (ref <150)
VLDL: 16 mg/dL (ref 0–40)

## 2018-03-30 LAB — CBC WITH DIFFERENTIAL/PLATELET
BASOS ABS: 0 10*3/uL (ref 0.0–0.1)
Basophils Relative: 0 %
EOS ABS: 0 10*3/uL (ref 0.0–0.7)
EOS PCT: 1 %
HCT: 52.2 % — ABNORMAL HIGH (ref 39.0–52.0)
Hemoglobin: 16.7 g/dL (ref 13.0–17.0)
Lymphocytes Relative: 13 %
Lymphs Abs: 0.8 10*3/uL (ref 0.7–4.0)
MCH: 32 pg (ref 26.0–34.0)
MCHC: 32 g/dL (ref 30.0–36.0)
MCV: 100 fL (ref 78.0–100.0)
Monocytes Absolute: 0.8 10*3/uL (ref 0.1–1.0)
Monocytes Relative: 13 %
Neutro Abs: 4.6 10*3/uL (ref 1.7–7.7)
Neutrophils Relative %: 73 %
PLATELETS: 146 10*3/uL — AB (ref 150–400)
RBC: 5.22 MIL/uL (ref 4.22–5.81)
RDW: 15.9 % — AB (ref 11.5–15.5)
WBC: 6.2 10*3/uL (ref 4.0–10.5)

## 2018-03-30 LAB — MAGNESIUM: Magnesium: 2.2 mg/dL (ref 1.7–2.4)

## 2018-03-30 LAB — I-STAT CG4 LACTIC ACID, ED
Lactic Acid, Venous: 2.07 mmol/L (ref 0.5–1.9)
Lactic Acid, Venous: 1.69 mmol/L (ref 0.5–1.9)

## 2018-03-30 LAB — APTT: APTT: 27 s (ref 24–36)

## 2018-03-30 LAB — PROTIME-INR
INR: 1.32
Prothrombin Time: 16.3 seconds — ABNORMAL HIGH (ref 11.4–15.2)

## 2018-03-30 LAB — I-STAT TROPONIN, ED: Troponin i, poc: 0.06 ng/mL (ref 0.00–0.08)

## 2018-03-30 LAB — BRAIN NATRIURETIC PEPTIDE: B Natriuretic Peptide: 758 pg/mL — ABNORMAL HIGH (ref 0.0–100.0)

## 2018-03-30 LAB — TROPONIN I: Troponin I: 0.03 ng/mL (ref <0.03)

## 2018-03-30 LAB — TSH: TSH: 1.681 u[IU]/mL (ref 0.350–4.500)

## 2018-03-30 MED ORDER — SODIUM CHLORIDE 0.9% FLUSH: 3.0000 mL | INTRAVENOUS | Status: DC | PRN

## 2018-03-30 MED ORDER — DILTIAZEM HCL-DEXTROSE 100-5 MG/100ML-% IV SOLN (PREMIX)
5.0000 mg/h | INTRAVENOUS | Status: AC
  Administered 2018-03-30: 5 mg/h via INTRAVENOUS
  Administered 2018-03-30 – 2018-03-31 (×2): 10 mg/h via INTRAVENOUS
  Filled 2018-03-30 (×4): qty 100

## 2018-03-30 MED ORDER — SODIUM CHLORIDE 0.9 % IV SOLN: 250.0000 mL | INTRAVENOUS | Status: DC | PRN

## 2018-03-30 MED ORDER — APIXABAN 2.5 MG PO TABS
2.5000 mg | ORAL_TABLET | Freq: Two times a day (BID) | ORAL | Status: DC
  Administered 2018-03-30 – 2018-04-04 (×10): 2.5 mg via ORAL
  Filled 2018-03-30 (×10): qty 1

## 2018-03-30 MED ORDER — AMIODARONE HCL 200 MG PO TABS: 100.0000 mg | ORAL_TABLET | ORAL | Status: DC

## 2018-03-30 MED ORDER — ALBUTEROL SULFATE (2.5 MG/3ML) 0.083% IN NEBU: 3.0000 mL | INHALATION_SOLUTION | Freq: Four times a day (QID) | RESPIRATORY_TRACT | Status: DC | PRN

## 2018-03-30 MED ORDER — SODIUM CHLORIDE 0.9% FLUSH
3.0000 mL | Freq: Two times a day (BID) | INTRAVENOUS | Status: DC
  Administered 2018-03-30 – 2018-04-04 (×9): 3 mL via INTRAVENOUS

## 2018-03-30 MED ORDER — PRAVASTATIN SODIUM 20 MG PO TABS
20.0000 mg | ORAL_TABLET | Freq: Every day | ORAL | Status: DC
  Administered 2018-03-31 – 2018-04-03 (×4): 20 mg via ORAL
  Filled 2018-03-30 (×4): qty 1

## 2018-03-30 MED ORDER — POLYETHYLENE GLYCOL 3350 17 G PO PACK
17.0000 g | PACK | Freq: Every day | ORAL | Status: DC
  Administered 2018-03-30 – 2018-03-31 (×2): 17 g via ORAL
  Filled 2018-03-30 (×4): qty 1

## 2018-03-30 MED ORDER — ONDANSETRON HCL 4 MG/2ML IJ SOLN: 4.0000 mg | Freq: Four times a day (QID) | INTRAMUSCULAR | Status: DC | PRN

## 2018-03-30 MED ORDER — DILTIAZEM LOAD VIA INFUSION
10.0000 mg | Freq: Once | INTRAVENOUS | Status: AC
Start: 2018-03-30 — End: 2018-03-30
  Administered 2018-03-30: 10 mg via INTRAVENOUS
  Filled 2018-03-30: qty 10

## 2018-03-30 MED ORDER — LINACLOTIDE 145 MCG PO CAPS
290.0000 ug | ORAL_CAPSULE | Freq: Every day | ORAL | Status: DC
  Administered 2018-03-31 – 2018-04-04 (×2): 290 ug via ORAL
  Filled 2018-03-30 (×5): qty 2

## 2018-03-30 MED ORDER — ACETAMINOPHEN 325 MG PO TABS: 650.0000 mg | ORAL_TABLET | ORAL | Status: DC | PRN

## 2018-03-30 MED ORDER — FUROSEMIDE 10 MG/ML IJ SOLN
40.0000 mg | Freq: Once | INTRAMUSCULAR | Status: AC
  Administered 2018-03-30: 40 mg via INTRAVENOUS
  Filled 2018-03-30: qty 4

## 2018-03-30 NOTE — ED Notes (Signed)
Notified EDP Yao of elevated lactic of 2.07

## 2018-03-30 NOTE — H&P (Signed)
History and Physical    [name] [name] XBJ:[id number] DOB: [birth year] DOA: 03/30/2018  PCP: Cassandria Anger, MD Patient coming from: Home  I have personally briefly reviewed patient's old medical records in [place]  Chief Complaint: Shortness of breath, leg swelling  HPI: [name] [name] is a 84 y.o. male with medical history significant for heart failure with reduced ejection fraction (last EF 25% in May 2018), paroxysmal atrial fibrillation on Eliquis and amiodarone status post DCCV in 2017, BPH, GERD and anxiety who presents to the ED from home with progressive shortness of breath and lower extremity edema.  Patient states that he has been taking increased doses of Lasix at home, but states that his swelling and shortness of breath have continued to worsen.  He endorses adherence with amiodarone.  He states that he has had a frequent cough for the last month, recently treated with Augmentin which has improved his symptoms.  He denies chest pain or palpitations.  Denies fever, chills, myalgias, other infectious symptoms.  He states that he lives at home alone, with a neighbor who checks infrequently and his son who lives a few blocks away.  He is engaged with home health services.  ED Course: NAD, patient was found to be tachycardic with heart rate elevated into the 150s, along with mild hypotension.  EKG shows atrial fibrillation with right bundle branch block pattern and left anterior fascicular block, both present on prior EKGs.  Labs notable for Hgb 16.7, platelets 146, creatinine 1.87 (baseline 1.4-1.6), TBili 2.6, BNP 758 (574 in May 2018), troponin 0.006. CXR showed cardiomegaly and vascular congestion with R>L pleural effusions. He was started on diltiazem infusion after initial bolus and given Lasix 40 mg IV x1. Heart rate decreased with dilt gtt, rhythm remained Afib.  Review of Systems: As per HPI otherwise 10 point review of systems negative.   Past Medical History:  Diagnosis  Date  . Anxiety   . Arthritis    "shoulders" (05/28/2016)  . Cardiomyopathy   . Coronary artery disease   . Depression    hx (05/28/2016)  . Hemorrhoids   . Hyperlipidemia   . Hypertension   . Skipped heart beats     Past Surgical History:  Procedure Laterality Date  . APPENDECTOMY    . CARDIOVERSION N/A 06/04/2016   Procedure: CARDIOVERSION;  Surgeon: Pixie Casino, MD;  Location: [facility] ENDOSCOPY;  Service: Cardiovascular;  Laterality: N/A;  . CARDIOVERSION N/A 04/09/2017   Procedure: CARDIOVERSION;  Surgeon: Thayer Headings, MD;  Location: [place];  Service: Cardiovascular;  Laterality: N/A;  . CATARACT EXTRACTION Left   . CHOLECYSTECTOMY OPEN    . COLONOSCOPY W/ BIOPSIES AND POLYPECTOMY    . INGUINAL HERNIA REPAIR Bilateral   . MYRINGOTOMY WITH TUBE PLACEMENT Bilateral   . skin cancer removal Right    side of nose by Rt eye  . TEE WITHOUT CARDIOVERSION N/A 06/04/2016   Procedure: TRANSESOPHAGEAL ECHOCARDIOGRAM (TEE);  Surgeon: Pixie Casino, MD;  Location[facility] ENDOSCOPY;  Service: Cardiovascular;  Laterality: N/A;  . TONSILLECTOMY AND ADENOIDECTOMY       reports that he has never smoked. He has never used smokeless tobacco. He reports that he does not drink alcohol or use drugs.  Allergies  Allergen Reactions  . Lisinopril Cough    Family History  Problem Relation Age of Onset  . Heart disease Brother     Prior to Admission medications   Medication Sig Start Date End Date Taking? Authorizing Provider  ALPRAZolam (XANAX) 1 MG tablet Take 1 tablet (1 mg total) by mouth at bedtime. 12/23/17  Yes Plotnikov, Evie Lacks, MD  amiodarone (PACERONE) 200 MG tablet Take 0.5 tablets (100 mg total) by mouth 3 (three) times a week. Mondays, Wednesdays, and Fridays 09/19/17  Yes Minus Breeding, MD  apixaban (ELIQUIS) 2.5 MG TABS tablet Take 1 tablet (2.5 mg total) by mouth 2 (two) times daily. 01/14/18  Yes Minus Breeding, MD  finasteride (PROSCAR) 5 MG tablet Take 1 tablet (5 mg  total) by mouth daily. 10/24/17  Yes Plotnikov, Evie Lacks, MD  polyethylene glycol (MIRALAX / GLYCOLAX) packet Take 17 g by mouth at bedtime.   Yes [provider]  pravastatin (PRAVACHOL) 20 MG tablet Take 1 tablet (20 mg total) by mouth daily. 04/24/17  Yes Plotnikov, Evie Lacks, MD  senna (SENOKOT) 8.6 MG TABS tablet Take 1-2 tablets (8.6-17.2 mg total) by mouth at bedtime. 12/23/17  Yes Plotnikov, Evie Lacks, MD  amoxicillin-clavulanate (AUGMENTIN) 875-125 MG tablet Take 1 tablet by mouth 2 (two) times daily. Patient not taking: Reported on 03/25/2018 03/11/18   Marrian Salvage, FNP  benzonatate (TESSALON) 100 MG capsule Take 1 capsule (100 mg total) by mouth 3 (three) times daily as needed. Patient taking differently: Take 100 mg by mouth 3 (three) times daily as needed for cough.  03/11/18   Marrian Salvage, FNP  furosemide (LASIX) 20 MG tablet Take 1 tablet (20 mg total) by mouth daily as needed for fluid or edema (use for swelling, shortness of breath). 10/24/17   Plotnikov, Evie Lacks, MD  linaclotide (LINZESS) 290 MCG CAPS capsule Take 1 capsule (290 mcg total) by mouth daily before breakfast. Patient taking differently: Take 28 mcg by mouth daily as needed (constipation).  06/30/17   Pyrtle, Lajuan Lines, MD  promethazine-codeine (PHENERGAN WITH CODEINE) 6.25-10 MG/5ML syrup Take 5 mLs by mouth every 6 (six) hours as needed for cough. 03/25/18   Plotnikov, Evie Lacks, MD  ranitidine (ZANTAC) 150 MG tablet Take 1 tablet (150 mg total) by mouth 2 (two) times daily as needed for heartburn. 10/24/17   Plotnikov, Evie Lacks, MD  VENTOLIN HFA 108 (90 Base) MCG/ACT inhaler Inhale 2 puffs into the lungs every 6 (six) hours as needed for wheezing or shortness of breath.  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 Posterior pharynx clear of any exudate or lesions.  Neck: normal, supple, no masses, (+) JVD to angle of jaw Respiratory: absent BS at right base, decreased at left, no W/C/R Cardiovascular: irregularly irregular, normal S1/S2, no M/R/G, 3+ pitting edema of BLE to shins, 1+ pedal pulses Abdomen: no tenderness, no masses palpated. Bowel sounds positive.  Musculoskeletal: no clubbing / cyanosis. No joint deformity upper and lower extremities. Good ROM, no contractures Skin: no rashes, lesions, ulcers. No induration Neurologic: CN 2-12 grossly intact. Sensation intact, DTR normal. Strength 5/5 in all 4.  Psychiatric: Normal judgment and insight. Alert and oriented x 3. Normal mood.   Labs on Admission: I have personally reviewed following labs and imaging studies  CBC: Recent Labs  Lab 03/25/18 1444 03/30/18 1749  WBC 6.5 6.2  NEUTROABS 4.5 4.6  HGB 16.3 16.7  HCT 50.0 52.2*  MCV 97.7 100.0  PLT 110.0* 809*   Basic Metabolic Panel: Recent Labs  Lab 03/25/18 1444 03/30/18 1749  NA 143 144  K 4.4 4.6  CL 104 103  CO2 29 28  GLUCOSE 93 104*  BUN 25* 31*  CREATININE 1.64* 1.87*  CALCIUM 8.9 9.0   GFR: Estimated Creatinine Clearance: 24.5 mL/min (A) (by C-G formula based on SCr of 1.87 mg/dL (H)). Liver Function Tests: Recent Labs  Lab 03/25/18 1444 03/30/18 1749  AST 23 30  ALT 26 34  ALKPHOS 79 81  BILITOT 1.6* 2.6*  PROT 6.4 6.7  ALBUMIN 3.7 3.7   No results for input(s): LIPASE, AMYLASE in the last 168 hours. No results for input(s): AMMONIA in the last 168 hours. Coagulation Profile: No results for input(s): INR, PROTIME in the last 168 hours. Cardiac Enzymes: No results for input(s): CKTOTAL, CKMB, CKMBINDEX, TROPONINI in the last 168 hours. BNP (last 3 results) No results for input(s): PROBNP in  the last 8760 hours. HbA1C: No results for input(s): HGBA1C in the last 72 hours. CBG: No results for input(s): GLUCAP in the last 168 hours. Lipid Profile: No results for input(s): CHOL, HDL, LDLCALC, TRIG, CHOLHDL, LDLDIRECT in the last 72 hours. Thyroid Function Tests: No results for input(s): TSH, T4TOTAL, FREET4, T3FREE, THYROIDAB in the last 72 hours. Anemia Panel: No results for input(s): VITAMINB12, FOLATE, FERRITIN, TIBC, IRON, RETICCTPCT in the last 72 hours. Urine analysis:    Component Value Date/Time   COLORURINE YELLOW 03/30/2018 1750   APPEARANCEUR CLEAR 03/30/2018 1750   LABSPEC 1.013 03/30/2018 1750   PHURINE 5.0 03/30/2018 1750   GLUCOSEU NEGATIVE 03/30/2018 1750   GLUCOSEU NEGATIVE 10/23/2017 1245   HGBUR MODERATE (A) 03/30/2018 1750   BILIRUBINUR NEGATIVE 03/30/2018 1750   KETONESUR NEGATIVE 03/30/2018 1750   PROTEINUR NEGATIVE 03/30/2018 1750   UROBILINOGEN 0.2 10/23/2017 1245   NITRITE NEGATIVE 03/30/2018 1750   LEUKOCYTESUR NEGATIVE 03/30/2018 1750    Radiological Exams on Admission: Dg Chest Port 1 View  Result Date: 03/30/2018 CLINICAL DATA:  Left leg edema and weakness EXAM: PORTABLE CHEST 1 VIEW COMPARISON:  03/11/2018 FINDINGS: Chronic cardiomegaly. Atherosclerotic calcification. Pleural effusions on the right more than left. The left pleural effusion is small. The right pleural effusion is small to moderate. The underlying lungs are obscured. No Kerley lines. No pneumothorax. Central vascular congestion. Severe glenohumeral osteoarthritis. IMPRESSION: Cardiomegaly with vascular congestion and right more than left pleural effusion. Electronically Signed   By: Monte Fantasia M.D.   On: 03/30/2018 18:20    EKG: Independently reviewed. Afib with RVR with RBBB and LAFB.  Assessment/Plan Active Problems:   A-fib (HCC)  Afib with RVR - Admit to SDU - Continue dilt gtt, titrate off - Transition to beta blockade when HR well controlled given HFrEF -  Obtain TTE - Monitor on telemetry - Trend trop to peak - Continue amiodarone - Continue Eliquis - Check TSH, FLP, A1c - Replete electrolytes PRN - Consider Cardiology consult in AM  Acute on chronic systolic heart failure exacerbation - Management of Afib as above - Diurese with IV Lasix, goal 1-2L negative fluid balance - Obtain TTE, monitor on telemetry - Risk stratification labs as above  Elevated LFTs Cholestatic pattern with elevated TBili. Favor congestive hepatopathy. - Diuresis as above - Daily CMP, trend LFTs  Chronic medical conditions: -  BPH: continue finasteride - Anxiety: continue Xanax - Constipation: continue Linzess  DVT prophylaxis: Eliquis Code Status: Full Disposition Plan: Home v SNF Consults called: None Admission status: SDU  Bennie Pierini MD [facility]  If 7PM-7AM, please contact night-coverage [email or web address] Password [email or web address]  03/30/2018, 9:35 PM
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
## 2018-03-30 NOTE — Progress Notes (Signed)
ED TO INPATIENT HANDOFF REPORT  Name/Age/Gender [name] [name] 84 y.o. male  Code Status Code Status History    Date Active Date Inactive Code Status Order ID Comments User Context   04/06/2017 1842 04/14/2017 1819 Full Code [id number]  Jonetta Osgood, MD Inpatient   08/22/2016 1801 08/23/2016 1802 Full Code [id number]  Louellen Molder, MD Inpatient   08/22/2016 1651 08/22/2016 1801 Full Code [id number]  Louellen Molder, MD ED   06/03/2016 2019 06/05/2016 1823 Full Code [id number]  Schorr, Rhetta Mura, NP Inpatient   05/28/2016 1514 06/03/2016 2019 DNR [id number]  Samella Parr, NP Inpatient    Advance Directive Documentation     Most Recent Value  Type of Advance Directive  Healthcare Power of Attorney  Pre-existing out of facility DNR order (yellow form or pink MOST form)  -  "MOST" Form in Place?  -      Home/SNF/Other Home  Chief Complaint Weakness  Level of Care/Admitting Diagnosis ED Disposition    ED Disposition Condition [place]: [place] [[id number]]  Level of Care: Stepdown [14]  Admit to SDU based on following criteria: Cardiac Instability:  Patients experiencing chest pain, unconfirmed MI and stable, arrhythmias and CHF requiring medical management and potentially compromising patient's stability  Diagnosis: A-fib [facility]) [[id number]]  Admitting Physician: Bennie Pierini [[id number]]  Attending Physician: Jonnie Finner, [place] [[id number]]  Estimated length of stay: past midnight tomorrow  Certification:: I certify this patient will need inpatient services for at least 2 midnights  PT Class (Do Not Modify): Inpatient [101]  PT Acc Code (Do Not Modify): Private [1]       Medical History Past Medical History:  Diagnosis Date  . Anxiety   . Arthritis    "shoulders" (05/28/2016)  . Cardiomyopathy   . Coronary artery disease   . Depression    hx (05/28/2016)  . Hemorrhoids   . Hyperlipidemia   . Hypertension   . Skipped heart beats      Allergies Allergies  Allergen Reactions  . Lisinopril Cough    IV Location/Drains/Wounds Patient Lines/Drains/Airways Status   Active Line/Drains/Airways    Name:   Placement date:   Placement time:   Site:   Days:   Peripheral IV 03/30/18 Left Forearm   03/30/18    1600    Forearm   less than 1   Peripheral IV 03/30/18 Right Forearm   03/30/18    1610    Forearm   less than 1   External Urinary Catheter   04/12/17    1100    -   352   Wound 10/13/12 Abrasion(s) Face Left bleeding controlled, red   10/13/12    1147    Face   1994   Wound 10/13/12 Skin tear Wrist Left bleeding controlled, red, purple, black   10/13/12    1148    Wrist   1994   Wound 10/13/12 Abrasion(s) Knee Left bleeding controlled, red   10/13/12    1149    Knee   1994   Wound / Incision (Open or Dehisced) 05/28/16 Laceration Head Upper;Mid jagged   05/28/16    1130    Head   671   Wound / Incision (Open or Dehisced) 05/30/16 Laceration Arm Right   05/30/16    2115    Arm   669   Wound / Incision (Open or Dehisced) 08/22/16 Laceration Head Posterior Old blood with 2 staples from ED   08/22/16  1759    Head   585          Labs/Imaging Results for orders placed or performed during the hospital encounter of 03/30/18 (from the past 48 hour(s))  CBC with Differential/Platelet     Status: Abnormal   Collection Time: 03/30/18  5:49 PM  Result Value Ref Range   WBC 6.2 4.0 - 10.5 K/uL   RBC 5.22 4.22 - 5.81 MIL/uL   Hemoglobin 16.7 13.0 - 17.0 g/dL   HCT 52.2 (H) 39.0 - 52.0 %   MCV 100.0 78.0 - 100.0 fL   MCH 32.0 26.0 - 34.0 pg   MCHC 32.0 30.0 - 36.0 g/dL   RDW 15.9 (H) 11.5 - 15.5 %   Platelets 146 (L) 150 - 400 K/uL   Neutrophils Relative % 73 %   Neutro Abs 4.6 1.7 - 7.7 K/uL   Lymphocytes Relative 13 %   Lymphs Abs 0.8 0.7 - 4.0 K/uL   Monocytes Relative 13 %   Monocytes Absolute 0.8 0.1 - 1.0 K/uL   Eosinophils Relative 1 %   Eosinophils Absolute 0.0 0.0 - 0.7 K/uL   Basophils Relative 0 %    Basophils Absolute 0.0 0.0 - 0.1 K/uL    Comment: Performed at [facility], [place] [address] [place], [place] [address]  Comprehensive metabolic panel     Status: Abnormal   Collection Time: 03/30/18  5:49 PM  Result Value Ref Range   Sodium 144 135 - 145 mmol/L   Potassium 4.6 3.5 - 5.1 mmol/L   Chloride 103 101 - 111 mmol/L   CO2 28 22 - 32 mmol/L   Glucose, Bld 104 (H) 65 - 99 mg/dL   BUN 31 (H) 6 - 20 mg/dL   Creatinine, Ser 1.87 (H) 0.61 - 1.24 mg/dL   Calcium 9.0 8.9 - 10.3 mg/dL   Total Protein 6.7 6.5 - 8.1 g/dL   Albumin 3.7 3.5 - 5.0 g/dL   AST 30 15 - 41 U/L   ALT 34 17 - 63 U/L   Alkaline Phosphatase 81 38 - 126 U/L   Total Bilirubin 2.6 (H) 0.3 - 1.2 mg/dL   GFR calc non Af Amer 29 (L) >60 mL/min   GFR calc Af Amer 33 (L) >60 mL/min    Comment: (NOTE) The eGFR has been calculated using the CKD EPI equation. This calculation has not been validated in all clinical situations. eGFR's persistently <60 mL/min signify possible Chronic Kidney Disease.    Anion gap 13 5 - 15    Comment: Performed at [facility], [place] [address] [place], [place] [address]  Urinalysis, Routine w reflex microscopic     Status: Abnormal   Collection Time: 03/30/18  5:50 PM  Result Value Ref Range   Color, Urine YELLOW YELLOW   APPearance CLEAR CLEAR   Specific Gravity, Urine 1.013 1.005 - 1.030   pH 5.0 5.0 - 8.0   Glucose, UA NEGATIVE NEGATIVE mg/dL   Hgb urine dipstick MODERATE (A) NEGATIVE   Bilirubin Urine NEGATIVE NEGATIVE   Ketones, ur NEGATIVE NEGATIVE mg/dL   Protein, ur NEGATIVE NEGATIVE mg/dL   Nitrite NEGATIVE NEGATIVE   Leukocytes, UA NEGATIVE NEGATIVE   RBC / HPF 21-50 0 - 5 RBC/hpf   WBC, UA 0-5 0 - 5 WBC/hpf   Bacteria, UA NONE SEEN NONE SEEN   Mucus PRESENT    Hyaline Casts, UA PRESENT     Comment: Performed at [facility], [address] [place], [place] [address]  Brain natriuretic peptide     Status:  Abnormal   Collection Time: 03/30/18  5:50 PM  Result Value Ref Range   B Natriuretic Peptide 758.0 (H) 0.0 - 100.0 pg/mL    Comment: Performed at [facility], [place] [address], [place], [place] [address]  I-stat troponin, ED     Status: None   Collection Time: 03/30/18  6:04 PM  Result Value Ref Range   Troponin i, poc 0.06 0.00 - 0.08 ng/mL   Comment 3            Comment: Due to the release kinetics of cTnI, a negative result within the first hours of the onset of symptoms does not rule out myocardial infarction with certainty. If myocardial infarction is still suspected, repeat the test at appropriate intervals.   I-Stat CG4 Lactic Acid, ED     Status: Abnormal   Collection Time: 03/30/18  6:06 PM  Result Value Ref Range   Lactic Acid, Venous 2.07 (HH) 0.5 - 1.9 mmol/L   Comment NOTIFIED PHYSICIAN   I-Stat CG4 Lactic Acid, ED     Status: None   Collection Time: 03/30/18  7:40 PM  Result Value Ref Range   Lactic Acid, Venous 1.69 0.5 - 1.9 mmol/L   Dg Chest Port 1 View  Result Date: 03/30/2018 CLINICAL DATA:  Left leg edema and weakness EXAM: PORTABLE CHEST 1 VIEW COMPARISON:  03/11/2018 FINDINGS: Chronic cardiomegaly. Atherosclerotic calcification. Pleural effusions on the right more than left. The left pleural effusion is small. The right pleural effusion is small to moderate. The underlying lungs are obscured. No Kerley lines. No pneumothorax. Central vascular congestion. Severe glenohumeral osteoarthritis. IMPRESSION: Cardiomegaly with vascular congestion and right more than left pleural effusion. Electronically Signed   By: Monte Fantasia M.D.   On: 03/30/2018 18:20    Pending Labs Unresulted Labs (From admission, onward)   Start     Ordered   03/30/18 2149  Hemoglobin A1c  Add-on,   R     03/30/18 2148   03/30/18 2149  Lipid panel  Add-on,   R     03/30/18 2148   03/30/18 1737  Blood culture (routine x 2)  BLOOD CULTURE X 2,   STAT     03/30/18 1736    03/30/18 1737  Urine culture  STAT,   STAT     03/30/18 1736   Signed and Held  Basic metabolic panel  Daily,   R     Signed and Held   Signed and Held  Magnesium  Add-on,   R     Signed and Held   Signed and Held  TSH  Add-on,   R     Signed and Held   Signed and Held  Troponin I  Now then every 6 hours,   R     Signed and Held   Signed and Held  Protime-INR  Once,   R     Signed and Held   Signed and Held  APTT  Once,   R     Signed and Held   Signed and Held  Magnesium  (Magnesium replacement + AM level)  Once,   R     Signed and Held      Vitals/Pain Today's Vitals   03/30/18 2030 03/30/18 2053 03/30/18 2100 03/30/18 2149  BP: 108/85  125/82 116/86  Pulse: 86   89  Resp: 16  18 (!) 24  Temp:      TempSrc:      SpO2: 93%   91%  PainSc:  0-No pain      Isolation Precautions No active isolations  Medications Medications  diltiazem (CARDIZEM) 1 mg/mL load via infusion 10 mg (10 mg Intravenous Bolus from Bag 03/30/18 1809)    And  diltiazem (CARDIZEM) 100 mg in dextrose 5% 182m (1 mg/mL) infusion (0 mg/hr Intravenous Canceled Entry 03/30/18 2044)  furosemide (LASIX) injection 40 mg (40 mg Intravenous Given 03/30/18 2052)    Mobility walks

## 2018-03-30 NOTE — ED Notes (Signed)
Bed: QN99 Expected date:  Expected time:  Means of arrival:  Comments: EMS FTT

## 2018-03-30 NOTE — ED Triage Notes (Signed)
Per EMS, pt complains of weakness, left leg edema. Pt's caregiver states the pt has had difficulty moving. Pt lives alone, caregiver comes once a week.   BP 124/98 HR 86 CBG 134 O2 95% on RA

## 2018-03-30 NOTE — Progress Notes (Signed)
*  Preliminary Results* Bilateral lower extremity venous duplex completed. Bilateral lower extremities are negative for deep vein thrombosis. There is no evidence of Baker's cyst bilaterally.  03/30/2018 6:56 PM Maudry Mayhew, BS, RVT, RDCS, RDMS

## 2018-03-31 ENCOUNTER — Inpatient Hospital Stay (HOSPITAL_COMMUNITY): Payer: Medicare Other

## 2018-03-31 DIAGNOSIS — I340 Nonrheumatic mitral (valve) insufficiency: Secondary | ICD-10-CM

## 2018-03-31 DIAGNOSIS — I481 Persistent atrial fibrillation: Secondary | ICD-10-CM

## 2018-03-31 DIAGNOSIS — F419 Anxiety disorder, unspecified: Secondary | ICD-10-CM

## 2018-03-31 DIAGNOSIS — I5023 Acute on chronic systolic (congestive) heart failure: Secondary | ICD-10-CM

## 2018-03-31 LAB — BASIC METABOLIC PANEL
ANION GAP: 13 (ref 5–15)
BUN: 30 mg/dL — ABNORMAL HIGH (ref 6–20)
CALCIUM: 8.8 mg/dL — AB (ref 8.9–10.3)
CHLORIDE: 103 mmol/L (ref 101–111)
CO2: 28 mmol/L (ref 22–32)
CREATININE: 1.69 mg/dL — AB (ref 0.61–1.24)
GFR calc non Af Amer: 32 mL/min — ABNORMAL LOW (ref >60)
GFR, EST AFRICAN AMERICAN: 38 mL/min — AB (ref >60)
Glucose, Bld: 108 mg/dL — ABNORMAL HIGH (ref 65–99)
Potassium: 4.1 mmol/L (ref 3.5–5.1)
SODIUM: 144 mmol/L (ref 135–145)

## 2018-03-31 LAB — MAGNESIUM: Magnesium: 2 mg/dL (ref 1.7–2.4)

## 2018-03-31 LAB — MRSA PCR SCREENING: MRSA by PCR: NEGATIVE

## 2018-03-31 LAB — HEMOGLOBIN A1C
HEMOGLOBIN A1C: 5.7 % — AB (ref 4.8–5.6)
MEAN PLASMA GLUCOSE: 116.89 mg/dL

## 2018-03-31 LAB — ECHOCARDIOGRAM COMPLETE
HEIGHTINCHES: 67 in
Weight: 3072.33 oz

## 2018-03-31 LAB — TROPONIN I: Troponin I: 0.03 ng/mL (ref <0.03)

## 2018-03-31 MED ORDER — ORAL CARE MOUTH RINSE
15.0000 mL | Freq: Two times a day (BID) | OROMUCOSAL | Status: DC
  Administered 2018-03-31 – 2018-04-01 (×3): 15 mL via OROMUCOSAL

## 2018-03-31 MED ORDER — SODIUM CHLORIDE 0.9% FLUSH
3.0000 mL | Freq: Two times a day (BID) | INTRAVENOUS | Status: DC
  Administered 2018-03-31 – 2018-04-04 (×9): 3 mL via INTRAVENOUS

## 2018-03-31 MED ORDER — SODIUM CHLORIDE 0.9 % IV SOLN
250.0000 mL | INTRAVENOUS | Status: DC
Start: 2018-03-31 — End: 2018-04-04

## 2018-03-31 MED ORDER — CHLORHEXIDINE GLUCONATE 0.12 % MT SOLN
15.0000 mL | Freq: Two times a day (BID) | OROMUCOSAL | Status: DC
  Administered 2018-03-31 – 2018-04-02 (×5): 15 mL via OROMUCOSAL
  Filled 2018-03-31 (×5): qty 15

## 2018-03-31 MED ORDER — ALPRAZOLAM 1 MG PO TABS
1.0000 mg | ORAL_TABLET | Freq: Every day | ORAL | Status: DC
  Administered 2018-03-31 – 2018-04-03 (×4): 1 mg via ORAL
  Filled 2018-03-31 (×4): qty 1

## 2018-03-31 MED ORDER — SODIUM CHLORIDE 0.9% FLUSH: 3.0000 mL | INTRAVENOUS | Status: DC | PRN

## 2018-03-31 MED ORDER — FUROSEMIDE 10 MG/ML IJ SOLN
40.0000 mg | Freq: Two times a day (BID) | INTRAMUSCULAR | Status: DC
  Administered 2018-03-31 – 2018-04-01 (×2): 40 mg via INTRAVENOUS
  Filled 2018-03-31 (×2): qty 4

## 2018-03-31 MED ORDER — AMIODARONE HCL 200 MG PO TABS
200.0000 mg | ORAL_TABLET | Freq: Every day | ORAL | Status: DC
  Administered 2018-03-31 – 2018-04-02 (×3): 200 mg via ORAL
  Filled 2018-03-31 (×3): qty 1

## 2018-03-31 NOTE — Progress Notes (Signed)
PROGRESS NOTE    [name] [name]  RKY:[id number] DOB: 1920-01-12 DOA: 03/30/2018 PCP: Cassandria Anger, MD    Brief Narrative:   84 y.o. male with medical history significant for heart failure with reduced ejection fraction (last EF 25% in May 2018), paroxysmal atrial fibrillation on Eliquis and amiodarone status post DCCV in 2017, BPH, GERD and anxiety who presents to the ED from home with progressive shortness of breath and lower extremity edema.   Assessment & Plan:   Active Problems:   A-fib (HCC) -  Pt is currently rate controlled on amiodarone po and diltiazem gtt. At home patient was taking amiodarone.  He states he was not missing any doses.  As such I have consulted cardiology as patient's home medication regimen may be need to be adjusted to prevent recurrence of A. fib with RVR and subsequent hospitalizations. -Patient is anticoagulated with Eliquis   Systolic CHF, chronic (HCC) -Echocardiogram order placed.  Agree with this.  Per H and P patient had echo in May 2018 with an EF of 25% at that point. -Again I have consulted cardiology and patient was given Lasix 40 mg in the emergency department.  Patient is -773 mL's.  Will defer further diuretic regimen to cardiology who will evaluate later today.    Anxiety -We will continue home Xanax regimen    Essential hypertension -Patient on Cardizem drip currently    CKD (chronic kidney disease) stage 3, GFR 30-59 ml/min (HCC) -Stable patient producing urine.  Serum creatinine down when compared to last.  On last check 1.6    DVT prophylaxis: Eliquis Code Status: Full Family Communication: None at bedside, updated Mr Cerda (son) over the phone Disposition Plan: pending improvement in condition. While on cardizem gtt in stepdown   Consultants:   Cardiology   Procedures: none   Antimicrobials: none   Subjective: Pt has no new complaints currently. No chest pain reported.  Objective: Vitals:   03/31/18 0600  03/31/18 0630 03/31/18 0742 03/31/18 0800  BP: 115/75   123/80  Pulse: 78 (!) 138 86 94  Resp: 20 20 (!) 22 (!) 21  Temp:    (!) 97.4 F (36.3 C)  TempSrc:    Oral  SpO2: 93% 93% 93% 94%  Weight:      Height:        Intake/Output Summary (Last 24 hours) at 03/31/2018 0932 Last data filed at 03/31/2018 0820 Gross per 24 hour  Intake 301.33 ml  Output 1075 ml  Net -773.67 ml   Filed Weights   03/30/18 2226 03/31/18 0530  Weight: 87.1 kg (192 lb 0.3 oz) 87.1 kg (192 lb 0.3 oz)    Examination:  General exam: Appears calm and comfortable, in nad. Respiratory system: Clear to auscultation. Respiratory effort normal. Cardiovascular system: IRRR. No  murmurs, rubs, gallops or clicks. + LE edema Gastrointestinal system: Abdomen is nondistended, soft and nontender. No organomegaly or masses felt. Normal bowel sounds heard. Central nervous system: Alert and oriented. No focal neurological deficits. Extremities: Symmetric 5 x 5 power, on limited exam. Skin: No rashes, lesions or ulcers, on limited exam Psychiatry:  Mood & affect appropriate.   Data Reviewed: I have personally reviewed following labs and imaging studies  CBC: Recent Labs  Lab 03/25/18 1444 03/30/18 1749  WBC 6.5 6.2  NEUTROABS 4.5 4.6  HGB 16.3 16.7  HCT 50.0 52.2*  MCV 97.7 100.0  PLT 110.0* 315*   Basic Metabolic Panel: Recent Labs  Lab 03/25/18 1444 03/30/18 1749 03/30/18  2244 03/31/18 0646  NA 143 144  --  144  K 4.4 4.6  --  4.1  CL 104 103  --  103  CO2 29 28  --  28  GLUCOSE 93 104*  --  108*  BUN 25* 31*  --  30*  CREATININE 1.64* 1.87*  --  1.69*  CALCIUM 8.9 9.0  --  8.8*  MG  --   --  2.2 2.0   GFR: Estimated Creatinine Clearance: 26.9 mL/min (A) (by C-G formula based on SCr of 1.69 mg/dL (H)). Liver Function Tests: Recent Labs  Lab 03/25/18 1444 03/30/18 1749  AST 23 30  ALT 26 34  ALKPHOS 79 81  BILITOT 1.6* 2.6*  PROT 6.4 6.7  ALBUMIN 3.7 3.7   No results for input(s):  LIPASE, AMYLASE in the last 168 hours. No results for input(s): AMMONIA in the last 168 hours. Coagulation Profile: Recent Labs  Lab 03/30/18 2244  INR 1.32   Cardiac Enzymes: Recent Labs  Lab 03/30/18 2244 03/31/18 0646  TROPONINI 0.03* 0.03*   BNP (last 3 results) No results for input(s): PROBNP in the last 8760 hours. HbA1C: Recent Labs    03/31/18 0646  HGBA1C 5.7*   CBG: No results for input(s): GLUCAP in the last 168 hours. Lipid Profile: Recent Labs    03/30/18 1749  CHOL 112  HDL 39*  LDLCALC 57  TRIG 80  CHOLHDL 2.9   Thyroid Function Tests: Recent Labs    03/30/18 2244  TSH 1.681   Anemia Panel: No results for input(s): VITAMINB12, FOLATE, FERRITIN, TIBC, IRON, RETICCTPCT in the last 72 hours. Sepsis Labs: Recent Labs  Lab 03/30/18 1806 03/30/18 1940  LATICACIDVEN 2.07* 1.69    Recent Results (from the past 240 hour(s))  MRSA PCR Screening     Status: None   Collection Time: 03/30/18 10:24 PM  Result Value Ref Range Status   MRSA by PCR NEGATIVE NEGATIVE Final    Comment:        The GeneXpert MRSA Assay (FDA approved for NASAL specimens only), is one component of a comprehensive MRSA colonization surveillance program. It is not intended to diagnose MRSA infection nor to guide or monitor treatment for MRSA infections. Performed at [facility], [place] [address] [place], [place] [address]      Radiology Studies: Dg Chest Port 1 View  Result Date: 03/30/2018 CLINICAL DATA:  Left leg edema and weakness EXAM: PORTABLE CHEST 1 VIEW COMPARISON:  03/11/2018 FINDINGS: Chronic cardiomegaly. Atherosclerotic calcification. Pleural effusions on the right more than left. The left pleural effusion is small. The right pleural effusion is small to moderate. The underlying lungs are obscured. No Kerley lines. No pneumothorax. Central vascular congestion. Severe glenohumeral osteoarthritis. IMPRESSION: Cardiomegaly with vascular  congestion and right more than left pleural effusion. Electronically Signed   By: Monte Fantasia M.D.   On: 03/30/2018 18:20     Scheduled Meds: . ALPRAZolam  1 mg Oral QHS  . [START ON 04/01/2018] amiodarone  100 mg Oral Once per day on Mon Wed Fri  . apixaban  2.5 mg Oral BID  . chlorhexidine  15 mL Mouth Rinse BID  . linaclotide  290 mcg Oral QAC breakfast  . mouth rinse  15 mL Mouth Rinse q12n4p  . polyethylene glycol  17 g Oral QHS  . pravastatin  20 mg Oral q1800  . sodium chloride flush  3 mL Intravenous Q12H   Continuous Infusions: . sodium chloride    .  diltiazem (CARDIZEM) infusion 7.5 mg/hr (03/31/18 0740)     LOS: 1 day    Time spent: 25 min  Velvet Bathe, MD [facility] Pager [phone]  If 7PM-7AM, please contact night-coverage [email or web address] Password [facility] 03/31/2018, 9:32 AM

## 2018-03-31 NOTE — Progress Notes (Signed)
Patient scheduled @ [facility] for TEE/CV @ 1400. Patient to arrive to [facility] @ 1230. Spoke with Ubaldo Glassing, RN to set up [employer] .

## 2018-03-31 NOTE — Progress Notes (Signed)
  Echocardiogram 2D Echocardiogram has been performed.  Darlina Sicilian M 03/31/2018, 12:44 PM

## 2018-03-31 NOTE — Consult Note (Addendum)
Cardiology Consultation:   Patient ID: [name] [name]; [id number]; 23-May-1920   Admit date: 03/30/2018 Date of Consult: 03/31/2018  Primary Care Provider: Cassandria Anger, MD Primary Cardiologist: Dr. Percival Spanish  Patient Profile:   [name] [name] is a 84 y.o. male with a hx of cardiomyopathy/ chronic systolic HF with EF of 53-66% (presumed to be tachy mediated), PAF w/ prior cardioversion and treatment w/ amiodarone and Eliquis h/o CKD and recent outpatient treatment for presumed CAP, who is being seen today for the evaluation of acute on chronic systolic HF and atrial fibrillation w/ RVR at the request of Dr. Wendee Beavers, Internal Medicine.  History of Present Illness:   [name] [name] is followed by Dr. Percival Spanish. He has chronic systolic HF. EF was 25-30% in May 2018. His cardiomyopathy is presumed to be secondary to rapid atrial fibrillation, which has required treatment with cardioversion as well as w/ AAD therapy. He is on low dose amiodarone. He has had some issues with bradycardia in the past and his BB was discontinued. He is on chronic anticoagulation w/ Eliquis for stroke prophylaxis. He is on low dose 2.5 mg BID, due to age > 80 and Scr > 1.5. He has CKD w/ baseline sCr between 1.6-2.0 over recent years. He is fairly independent and still lives at home by himself but notes that he has his son and a friend stop by a few days a week to help out.   His last OV with Dr. Percival Spanish was 01/14/18. His rate was controlled and amiodarone was continued.   Pt notes that he was in his usual state of health until 2 weeks ago, when he was diagnosed with PNA. He reports he was treated as an outpatient and was given an antibiotic. Per chart review, it looks like he was treated with Augmentin and also given Tessalon pearls.   Pt notes that shortly afterwards, he started to develop progressive LEE and exertional dyspnea. He reports full compliance with Lasix. He even tried increasing home dose but little improvement.  Worsening symptoms prompted him to come to [facility] ED, where he was noted to be volume overloaded and in rapid atrial fibrillation. BNP was 758. CXR showed cardiomegaly with vascular congestion and pleural effusion, R>L. Vrates were in the 150s. IV Cardizem started. TSH WNL. K normal at 4.1. Mg 2.0. SCr 1.87 on admit, but improved today to 1.69. He was admitted by IM and started on IV Lasix. 40 mg IV x 1 in the ED. He put out 1L overnight but still with 2+ bilateral pitting edema on exam. His Vrates are improved in the 80s today with IV cardizem.   Past Medical History:  Diagnosis Date  . Anxiety   . Arthritis    "shoulders" (05/28/2016)  . Cardiomyopathy   . Coronary artery disease   . Depression    hx (05/28/2016)  . Hemorrhoids   . Hyperlipidemia   . Hypertension   . Skipped heart beats     Past Surgical History:  Procedure Laterality Date  . APPENDECTOMY    . CARDIOVERSION N/A 06/04/2016   Procedure: CARDIOVERSION;  Surgeon: Pixie Casino, MD;  Location: [facility] ENDOSCOPY;  Service: Cardiovascular;  Laterality: N/A;  . CARDIOVERSION N/A 04/09/2017   Procedure: CARDIOVERSION;  Surgeon: Thayer Headings, MD;  Location: [facility];  Service: Cardiovascular;  Laterality: N/A;  . CATARACT EXTRACTION Left   . CHOLECYSTECTOMY OPEN    . COLONOSCOPY W/ BIOPSIES AND POLYPECTOMY    . INGUINAL HERNIA REPAIR Bilateral   .  MYRINGOTOMY WITH TUBE PLACEMENT Bilateral   . skin cancer removal Right    side of nose by Rt eye  . TEE WITHOUT CARDIOVERSION N/A 06/04/2016   Procedure: TRANSESOPHAGEAL ECHOCARDIOGRAM (TEE);  Surgeon: Pixie Casino, MD;  Location: [facility] ENDOSCOPY;  Service: Cardiovascular;  Laterality: N/A;  . TONSILLECTOMY AND ADENOIDECTOMY       Home Medications:  Prior to Admission medications   Medication Sig Start Date End Date Taking? Authorizing Provider  ALPRAZolam Duanne Moron) 1 MG tablet Take 1 tablet (1 mg total) by mouth at bedtime. 12/23/17  Yes Plotnikov, Evie Lacks, MD  amiodarone  (PACERONE) 200 MG tablet Take 0.5 tablets (100 mg total) by mouth 3 (three) times a week. Mondays, Wednesdays, and Fridays 09/19/17  Yes Minus Breeding, MD  apixaban (ELIQUIS) 2.5 MG TABS tablet Take 1 tablet (2.5 mg total) by mouth 2 (two) times daily. 01/14/18  Yes Minus Breeding, MD  finasteride (PROSCAR) 5 MG tablet Take 1 tablet (5 mg total) by mouth daily. 10/24/17  Yes Plotnikov, Evie Lacks, MD  polyethylene glycol (MIRALAX / GLYCOLAX) packet Take 17 g by mouth at bedtime.   Yes [provider]  pravastatin (PRAVACHOL) 20 MG tablet Take 1 tablet (20 mg total) by mouth daily. 04/24/17  Yes Plotnikov, Evie Lacks, MD  senna (SENOKOT) 8.6 MG TABS tablet Take 1-2 tablets (8.6-17.2 mg total) by mouth at bedtime. 12/23/17  Yes Plotnikov, Evie Lacks, MD  amoxicillin-clavulanate (AUGMENTIN) 875-125 MG tablet Take 1 tablet by mouth 2 (two) times daily. Patient not taking: Reported on 03/25/2018 03/11/18   Marrian Salvage, FNP  benzonatate (TESSALON) 100 MG capsule Take 1 capsule (100 mg total) by mouth 3 (three) times daily as needed. Patient taking differently: Take 100 mg by mouth 3 (three) times daily as needed for cough.  03/11/18   Marrian Salvage, FNP  furosemide (LASIX) 20 MG tablet Take 1 tablet (20 mg total) by mouth daily as needed for fluid or edema (use for swelling, shortness of breath). 10/24/17   Plotnikov, Evie Lacks, MD  linaclotide (LINZESS) 290 MCG CAPS capsule Take 1 capsule (290 mcg total) by mouth daily before breakfast. Patient taking differently: Take 28 mcg by mouth daily as needed (constipation).  06/30/17   Pyrtle, Lajuan Lines, MD  promethazine-codeine (PHENERGAN WITH CODEINE) 6.25-10 MG/5ML syrup Take 5 mLs by mouth every 6 (six) hours as needed for cough. 03/25/18   Plotnikov, Evie Lacks, MD  ranitidine (ZANTAC) 150 MG tablet Take 1 tablet (150 mg total) by mouth 2 (two) times daily as needed for heartburn. 10/24/17   Plotnikov, Evie Lacks, MD  VENTOLIN HFA 108 (90 Base) MCG/ACT  inhaler Inhale 2 puffs into the lungs every 6 (six) hours as needed for wheezing or shortness of breath.  03/19/18   [provider]    Inpatient Medications: Scheduled Meds: . ALPRAZolam  1 mg Oral QHS  . [START ON 04/01/2018] amiodarone  100 mg Oral Once per day on Mon Wed Fri  . apixaban  2.5 mg Oral BID  . chlorhexidine  15 mL Mouth Rinse BID  . linaclotide  290 mcg Oral QAC breakfast  . mouth rinse  15 mL Mouth Rinse q12n4p  . polyethylene glycol  17 g Oral QHS  . pravastatin  20 mg Oral q1800  . sodium chloride flush  3 mL Intravenous Q12H   Continuous Infusions: . sodium chloride    . diltiazem (CARDIZEM) infusion 7.5 mg/hr (03/31/18 0740)   PRN Meds: sodium chloride, acetaminophen, albuterol,  ondansetron (ZOFRAN) IV, sodium chloride flush  Allergies:    Allergies  Allergen Reactions  . Lisinopril Cough    Social History:   Social History   Socioeconomic History  . Marital status: Widowed    Spouse name: Not on file  . Number of children: 1  . Years of education: Not on file  . Highest education level: Not on file  Occupational History  . Occupation: Retired  Scientific laboratory technician  . Financial resource strain: Not on file  . Food insecurity:    Worry: Not on file    Inability: Not on file  . Transportation needs:    Medical: Not on file    Non-medical: Not on file  Tobacco Use  . Smoking status: Never Smoker  . Smokeless tobacco: Never Used  Substance and Sexual Activity  . Alcohol use: No  . Drug use: No  . Sexual activity: Yes  Lifestyle  . Physical activity:    Days per week: Not on file    Minutes per session: Not on file  . Stress: Not on file  Relationships  . Social connections:    Talks on phone: Not on file    Gets together: Not on file    Attends religious service: Not on file    Active member of club or organization: Not on file    Attends meetings of clubs or organizations: Not on file    Relationship status: Not on file  . Intimate  partner violence:    Fear of current or ex partner: Not on file    Emotionally abused: Not on file    Physically abused: Not on file    Forced sexual activity: Not on file  Other Topics Concern  . Not on file  Social History Narrative  . Not on file    Family History:    Family History  Problem Relation Age of Onset  . Heart disease Brother      ROS:  Please see the history of present illness.   All other ROS reviewed and negative.     Physical Exam/Data:   Vitals:   03/31/18 0600 03/31/18 0630 03/31/18 0742 03/31/18 0800  BP: 115/75   123/80  Pulse: 78 (!) 138 86 94  Resp: 20 20 (!) 22 (!) 21  Temp:    (!) 97.4 F (36.3 C)  TempSrc:    Oral  SpO2: 93% 93% 93% 94%  Weight:      Height:        Intake/Output Summary (Last 24 hours) at 03/31/2018 0937 Last data filed at 03/31/2018 0820 Gross per 24 hour  Intake 301.33 ml  Output 1075 ml  Net -773.67 ml   Filed Weights   03/30/18 2226 03/31/18 0530  Weight: 192 lb 0.3 oz (87.1 kg) 192 lb 0.3 oz (87.1 kg)   Body mass index is 30.07 kg/m.  General:  Well nourished, well developed, in no acute distress elderly HEENT: normal Lymph: no adenopathy Neck: no JVD Endocrine:  No thryomegaly Vascular: No carotid bruits; FA pulses 2+ bilaterally without bruits  Cardiac:  irregularly irregular rhythm, regular rate. Lungs:  clear to auscultation bilaterally, no wheezing, rhonchi or rales  Abd: soft, nontender, no hepatomegaly  Ext: 2+ bilateral LEE, pitting Musculoskeletal:  No deformities, BUE and BLE strength normal and equal Skin: warm and dry  Neuro:  CNs 2-12 intact, no focal abnormalities noted Psych:  Normal affect   EKG:  The EKG was personally reviewed and demonstrates: Wide-QRS tachycardia  RBBB and LAFB, 151 bpm Telemetry:  Telemetry was personally reviewed and demonstrates:  Atrial fibrillation w/ CVR in the 80s.    Relevant CV Studies: 2D Echo 04/07/18 Study Conclusions  - Left ventricle: The cavity  size was normal. There was mild   concentric hypertrophy. Systolic function was severely reduced.   The estimated ejection fraction was in the range of 25% to 30%.   Severe diffuse hypokinesis. Although no diagnostic regional wall   motion abnormality was identified, this possibility cannot be   completely excluded on the basis of this study. - Aortic valve: There was mild regurgitation. - Mitral valve: There was mild regurgitation directed centrally. - Left atrium: The atrium was severely dilated. - Right atrium: The atrium was severely dilated. - Pulmonary arteries: Systolic pressure was mildly increased. PA   peak pressure: 48 mm Hg (S).  Laboratory Data:  Chemistry Recent Labs  Lab 03/25/18 1444 03/30/18 1749 03/31/18 0646  NA 143 144 144  K 4.4 4.6 4.1  CL 104 103 103  CO2 29 28 28   GLUCOSE 93 104* 108*  BUN 25* 31* 30*  CREATININE 1.64* 1.87* 1.69*  CALCIUM 8.9 9.0 8.8*  GFRNONAA  --  29* 32*  GFRAA  --  33* 38*  ANIONGAP  --  13 13    Recent Labs  Lab 03/25/18 1444 03/30/18 1749  PROT 6.4 6.7  ALBUMIN 3.7 3.7  AST 23 30  ALT 26 34  ALKPHOS 79 81  BILITOT 1.6* 2.6*   Hematology Recent Labs  Lab 03/25/18 1444 03/30/18 1749  WBC 6.5 6.2  RBC 5.11 5.22  HGB 16.3 16.7  HCT 50.0 52.2*  MCV 97.7 100.0  MCH  --  32.0  MCHC 32.5 32.0  RDW 16.1* 15.9*  PLT 110.0* 146*   Cardiac Enzymes Recent Labs  Lab 03/30/18 2244 03/31/18 0646  TROPONINI 0.03* 0.03*    Recent Labs  Lab 03/30/18 1804  TROPIPOC 0.06    BNP Recent Labs  Lab 03/30/18 1750  BNP 758.0*    DDimer No results for input(s): DDIMER in the last 168 hours.  Radiology/Studies:  Dg Chest Port 1 View  Result Date: 03/30/2018 CLINICAL DATA:  Left leg edema and weakness EXAM: PORTABLE CHEST 1 VIEW COMPARISON:  03/11/2018 FINDINGS: Chronic cardiomegaly. Atherosclerotic calcification. Pleural effusions on the right more than left. The left pleural effusion is small. The right pleural  effusion is small to moderate. The underlying lungs are obscured. No Kerley lines. No pneumothorax. Central vascular congestion. Severe glenohumeral osteoarthritis. IMPRESSION: Cardiomegaly with vascular congestion and right more than left pleural effusion. Electronically Signed   By: Monte Fantasia M.D.   On: 03/30/2018 18:20    Assessment and Plan:   [name] [name] is a 84 y.o. male with a hx of cardiomyopathy/ chronic systolic HF with EF of 36-62% (presumed to be tachy mediated), PAF w/ prior cardioversion and treatment w/ amiodarone and Eliquis, h/o CKD and recent outpatient treatment for presumed CAP, who is being seen today for the evaluation of acute on chronic systolic HF and atrial fibrillation w/ RVR at the request of Dr. Wendee Beavers, Internal Medicine.   1. Acute on Chronic Systolic HF: EF 94-76%, presumed to be tachy mediated by rapid atrial fibrillation. 2 week h/o LEE and exertional dyspnea, progressively worsening. BNP elevated at 758. CXR with cardiomegaly and vascular congestion/ pleural effusions, R>L. He was given a dose of IV Lasix, 40 mg x 1 in the ED. -1L put out overnight. SCr improved  after initial dose of lasix, down from 1.8>>1.69. He remains volume overloaded today w/ 2+ bilateral LEE pitting edema, lungs are CTAB. He needs additional diuresis. BP and K stable. Will continue IV Lasix today, 40 mg BID. Continue strict I/Os and daily weights. Will monitor renal function and electrolytes. Low sodium diet. No ACE/ARB due to renal function. He had issues with bradycardia in the past w/ BBs.   2. Atrial Fibrillation w/ RVR: rates improved with IV Cardizem. Also getting PO amiodarone. He is on Eliquis for a/c. 2.5 mg for age > 80 and SCr > 1.5. If no spontaneous conversion back to NSR, then we may consider repeat DCCV. TSH, K and Mg all WNL. Will monitor electrolytes while diuresing for acute HF.   3. CKD: baseline SCr over the last few years, ~1.6-2.0. SCr 1.8 on admit and improved to 1.6  today after diuresis. Continue to monitor closely.  For questions or updates, please contact [place] Please consult [email or web address] for contact info under Cardiology/STEMI.   Signed, Lyda Jester, PA-C  03/31/2018 9:37 AM   As above, patient seen and examined.  Briefly he is a 84 year old male with past medical history of cardiomyopathy, chronic systolic congestive heart failure, paroxysmal atrial fibrillation, chronic stage III kidney disease who I am asked to evaluate for recurrent atrial fibrillation and acute on chronic systolic congestive heart failure.  Patient has had previous cardioversions and has been maintained on amiodarone.  He did have bradycardia with a combination of amiodarone and beta-blockade.  Patient states he has been treated for pneumonia 2 weeks ago.  He has described progressive dyspnea on exertion and bilateral lower extremity edema.  He denies palpitations, chest pain or syncope.  He was admitted with congestive heart failure and recurrent atrial fibrillation and cardiology asked to evaluate. Laboratories show creatinine 1.69.  BNP 758.  Troponins negative.  Electrocardiogram shows atrial fibrillation with rapid ventricular response, right bundle branch block, left anterior fascicular block.  1 Acute on chronic systolic congestive heart failure-patient is significantly volume overloaded on examination.  Continue IV diuresis with Lasix 40 mg IV twice daily.  May need higher dose.  Would fluid restrict to 1.5 L daily.  Low-sodium diet.  Follow renal function.  2 persistent atrial fibrillation-patient is in recurrent atrial fibrillation.  He has had cardioversions previously.  I think this is likely contributing to his worsening congestive heart failure.  Increase amiodarone to 200 mg daily.  Continue apixaban 2.5 mg twice daily.  I would like to see if he would maintain sinus rhythm as this would likely improve his congestive heart failure.  We will arrange TEE guided  cardioversion tomorrow.  I will discontinue Cardizem 12 hours prior to procedure as he has had bradycardia when in sinus in the past.  I have arranged TEE as he is not clear if he has missed any doses of apixaban in the past 4 weeks.  TSH is normal.  3 chronic stage III kidney disease-follow renal function closely with diuresis.  Hopefully creatinine will improve once sinus rhythm is reestablished with improved perfusion of kidneys.  4 cardiomyopathy-patient's LV function has been decreased in the past.  We will avoid beta blockade as he apparently has had bradycardia in the past.  We will consider ARB after cardioversion once it is clear renal function is stable.  Kirk Ruths, MD

## 2018-04-01 ENCOUNTER — Inpatient Hospital Stay (HOSPITAL_COMMUNITY): Payer: Medicare Other | Admitting: Certified Registered Nurse Anesthetist

## 2018-04-01 ENCOUNTER — Encounter (HOSPITAL_COMMUNITY)
Admission: EM | Disposition: A | Discharge status: Home Health | Payer: Self-pay | Source: Home / Self Care | Attending: Internal Medicine

## 2018-04-01 ENCOUNTER — Inpatient Hospital Stay (HOSPITAL_COMMUNITY)
Admit: 2018-04-01 | Discharge: 2018-04-01 | Disposition: A | Discharge status: Home / Self Care | Payer: Medicare Other | Attending: Cardiovascular Disease | Admitting: Cardiovascular Disease

## 2018-04-01 ENCOUNTER — Encounter (HOSPITAL_COMMUNITY): Payer: Self-pay | Admitting: Cardiovascular Disease

## 2018-04-01 DIAGNOSIS — I340 Nonrheumatic mitral (valve) insufficiency: Secondary | ICD-10-CM

## 2018-04-01 DIAGNOSIS — I10 Essential (primary) hypertension: Secondary | ICD-10-CM

## 2018-04-01 DIAGNOSIS — E875 Hyperkalemia: Secondary | ICD-10-CM

## 2018-04-01 DIAGNOSIS — N183 Chronic kidney disease, stage 3 (moderate): Secondary | ICD-10-CM

## 2018-04-01 HISTORY — PX: CARDIOVERSION: SHX1299

## 2018-04-01 HISTORY — PX: TEE WITHOUT CARDIOVERSION: SHX5443

## 2018-04-01 LAB — BASIC METABOLIC PANEL
Anion gap: 12 (ref 5–15)
BUN: 32 mg/dL — ABNORMAL HIGH (ref 6–20)
CO2: 27 mmol/L (ref 22–32)
Calcium: 8.4 mg/dL — ABNORMAL LOW (ref 8.9–10.3)
Chloride: 100 mmol/L — ABNORMAL LOW (ref 101–111)
Creatinine, Ser: 1.9 mg/dL — ABNORMAL HIGH (ref 0.61–1.24)
GFR calc Af Amer: 33 mL/min — ABNORMAL LOW (ref >60)
GFR, EST NON AFRICAN AMERICAN: 28 mL/min — AB (ref >60)
GLUCOSE: 119 mg/dL — AB (ref 65–99)
Potassium: 5.5 mmol/L — ABNORMAL HIGH (ref 3.5–5.1)
SODIUM: 139 mmol/L (ref 135–145)

## 2018-04-01 SURGERY — ECHOCARDIOGRAM, TRANSESOPHAGEAL
Anesthesia: General

## 2018-04-01 MED ORDER — LIDOCAINE 2% (20 MG/ML) 5 ML SYRINGE
INTRAMUSCULAR | Status: DC | PRN
  Administered 2018-04-01: 60 mg via INTRAVENOUS

## 2018-04-01 MED ORDER — PROPOFOL 10 MG/ML IV BOLUS
INTRAVENOUS | Status: DC | PRN
  Administered 2018-04-01: 20 mg via INTRAVENOUS
  Administered 2018-04-01 (×2): 10 mg via INTRAVENOUS
  Administered 2018-04-01: 20 mg via INTRAVENOUS

## 2018-04-01 MED ORDER — ONDANSETRON HCL 4 MG/2ML IJ SOLN
INTRAMUSCULAR | Status: DC | PRN
  Administered 2018-04-01: 4 mg via INTRAVENOUS

## 2018-04-01 MED ORDER — BUTAMBEN-TETRACAINE-BENZOCAINE 2-2-14 % EX AERO
INHALATION_SPRAY | CUTANEOUS | Status: DC | PRN
  Administered 2018-04-01: 1 via TOPICAL

## 2018-04-01 MED ORDER — SODIUM CHLORIDE 0.9 % IV SOLN
INTRAVENOUS | Status: DC
  Administered 2018-04-01: 12:00:00 via INTRAVENOUS

## 2018-04-01 MED ORDER — FUROSEMIDE 10 MG/ML IJ SOLN
80.0000 mg | Freq: Two times a day (BID) | INTRAMUSCULAR | Status: DC
Start: 2018-04-01 — End: 2018-04-02
  Administered 2018-04-01 – 2018-04-02 (×2): 80 mg via INTRAVENOUS
  Filled 2018-04-01 (×2): qty 8

## 2018-04-01 NOTE — Progress Notes (Signed)
PROGRESS NOTE   [name] [name]  MHD:[id number]    DOB: Jul 30, 1920    DOA: 03/30/2018  PCP: Cassandria Anger, MD   I have briefly reviewed patients previous medical records in [facility].  Brief Narrative:  84 year old male with PMH of cardiomyopathy (tachycardia mediated), chronic systolic CHF (LVEF 25- 21%), PAF with prior cardioversion and on amiodarone and Eliquis, stage III chronic kidney disease (baseline creatinine 1.6-2), right eye blindness, anxiety and depression, HTN, HLD, recently treated for CAP 2 weeks PTA, presented to ED due to progressive lower extremity edema and exertional dyspnea.  Admitted for acute on chronic systolic CHF.  Cardiology was consulted and underwent TEE cardioversion 04/01/2018 at [facility].   Assessment & Plan:   Active Problems:   Essential hypertension   CKD (chronic kidney disease) stage 3, GFR 30-59 ml/min (HCC)   Systolic CHF, chronic (HCC)   Atrial fibrillation with RVR (HCC)   Anxiety   Acute on chronic systolic CHF/cardiomyopathy: TTE 03/31/2018: LVEF 20-25% with diffuse hypokinesis, worse than prior.  Cardiology consulted and felt that the cardiomyopathy was related to tachycardia.  Treating with IV Lasix, dose increased and currently 80 mg every 12 hourly.  -938 mL since admission.  Weight down from 192 to 189 pounds.  Remains volume overloaded.  No ACEI/ARB due to renal insufficiency and hyperkalemia.  Persistent atrial fibrillation: Cardiology was consulted.  Amiodarone increased to 200 mg daily.  IV Cardizem drip discontinued 5/14 around 10 PM in anticipation of DCCV.  Continue low-dose Eliquis 2.5 mg twice daily.  TSH normal.  Patient had mild RVR up to 120s this morning prior to TEE.  He is status post TEE and successful cardioversion 5/15.  Stage III chronic kidney disease: Recent baseline creatinine appears to be 1.6 range.  Creatinine up to 1.9 on 5/15.  Likely related to diuresis.  Follow BMP closely.  Hyperkalemia: Not on potassium  supplements or ACEI/ARB.  Change diet to low potassium diet.  Continue IV Lasix.  Follow BMP in a.m.  Essential hypertension: Controlled.  Hyperlipidemia: Continue pravastatin.  Anxiety: Continue as needed Xanax.  Isolated hyperbilirubinemia: Unclear etiology.?  Passive hepatic congestion.  Follow LFTs in a.m.    DVT prophylaxis: Eliquis Code Status: Full Family Communication: None at bedside Disposition: Admitted to stepdown, continue care here.  Discharge disposition to be determined pending medical improvement and PT evaluation.   Consultants:  Cardiology  Procedures:  TEE and cardioversion 04/01/2018:  Brief TEE Note  LVEF 20% Diffuse hypokinesis No LA/LAA thrombus or mass Mild-moderate mitral regurgitation with multiple jets Mild TR, PR and AR Small posterior pericardial effusion L pleural effusion noted Moderate atherosclerosis of the descending aorta Severe enlargement of the LA.    Antimicrobials:  None   Subjective: Seen this morning prior to procedure.  States that he feels much better compared to admission.  Dyspnea improved but breathing not yet at baseline.  No chest pain or palpitations.  Still has leg swellings.  ROS: As above.  Objective:  Vitals:   04/01/18 1135 04/01/18 1338 04/01/18 1430 04/01/18 1445  BP: 119/77 103/70 125/84 128/68  Pulse: (!) 136  (!) 59 66  Resp: (!) 24 (!) 21 (!) 27 20  Temp: 97.6 F (36.4 C)  (!) 97.4 F (36.3 C)   TempSrc: Oral  Oral   SpO2: 91% 100% 91% 92%  Weight:      Height:        Examination:  General exam: Pleasant elderly male, moderately built and overweight lying  comfortably propped up in bed. Respiratory system: Slightly diminished breath sounds in the bases with occasional basal crackles but otherwise clear to auscultation.  No increased work of breathing. Cardiovascular system: S1 and S2 heard, irregularly irregular and mildly tachycardic.  No JVD, murmurs.  2+ pitting bilateral leg edema.   Telemetry personally reviewed: A. fib with sinus tachycardia up to 120s this morning. Gastrointestinal system: Abdomen is nondistended, soft and nontender. No organomegaly or masses felt. Normal bowel sounds heard. Central nervous system: Alert and oriented. No focal neurological deficits. Extremities: Symmetric 5 x 5 power. Skin: No rashes, lesions or ulcers Psychiatry: Judgement and insight appear normal. Mood & affect appropriate.  Eyes: Corneal opacity right eye with blindness.    Data Reviewed: I have personally reviewed following labs and imaging studies  CBC: Recent Labs  Lab 03/30/18 1749  WBC 6.2  NEUTROABS 4.6  HGB 16.7  HCT 52.2*  MCV 100.0  PLT 416*   Basic Metabolic Panel: Recent Labs  Lab 03/30/18 1749 03/30/18 2244 03/31/18 0646 04/01/18 0313  NA 144  --  144 139  K 4.6  --  4.1 5.5*  CL 103  --  103 100*  CO2 28  --  28 27  GLUCOSE 104*  --  108* 119*  BUN 31*  --  30* 32*  CREATININE 1.87*  --  1.69* 1.90*  CALCIUM 9.0  --  8.8* 8.4*  MG  --  2.2 2.0  --    Liver Function Tests: Recent Labs  Lab 03/30/18 1749  AST 30  ALT 34  ALKPHOS 81  BILITOT 2.6*  PROT 6.7  ALBUMIN 3.7   Coagulation Profile: Recent Labs  Lab 03/30/18 2244  INR 1.32   Cardiac Enzymes: Recent Labs  Lab 03/30/18 2244 03/31/18 0646  TROPONINI 0.03* 0.03*   HbA1C: Recent Labs    03/31/18 0646  HGBA1C 5.7*   CBG: No results for input(s): GLUCAP in the last 168 hours.  Recent Results (from the past 240 hour(s))  Blood culture (routine x 2)     Status: None (Preliminary result)   Collection Time: 03/30/18  5:50 PM  Result Value Ref Range Status   Specimen Description   Final    BLOOD LEFT ANTECUBITAL Performed at [facility] [address], [place], [place] [address]    Special Requests   Final    BOTTLES DRAWN AEROBIC AND ANAEROBIC Blood Culture adequate volume Performed at [facility] [address]  [place], [place] [address]    Culture   Final    NO GROWTH 2 DAYS Performed at [facility] [address], [place], [place] [address]    Report Status PENDING  Incomplete  Urine culture     Status: Abnormal (Preliminary result)   Collection Time: 03/30/18  5:50 PM  Result Value Ref Range Status   Specimen Description   Final    URINE, RANDOM Performed at [facility], [place] [address], [place], [place] [address]    Special Requests   Final    NONE Performed at [facility], [place] [address] [place], [place] [address]    Culture 40,000 COLONIES/mL ESCHERICHIA COLI (A)  Final   Report Status PENDING  Incomplete  Blood culture (routine x 2)     Status: None (Preliminary result)   Collection Time: 03/30/18  5:58 PM  Result Value Ref Range Status   Specimen Description   Final    BLOOD  RIGHT FOREARM Performed at [facility], [place] [address] [place], [place] [address]    Special Requests   Final    BOTTLES DRAWN AEROBIC AND ANAEROBIC Blood Culture adequate volume Performed at [facility] [address] [place], [place] [address]    Culture   Final    NO GROWTH 2 DAYS Performed at [facility] [address], [place], [place] [address]    Report Status PENDING  Incomplete  MRSA PCR Screening     Status: None   Collection Time: 03/30/18 10:24 PM  Result Value Ref Range Status   MRSA by PCR NEGATIVE NEGATIVE Final    Comment:        The GeneXpert MRSA Assay (FDA approved for NASAL specimens only), is one component of a comprehensive MRSA colonization surveillance program. It is not intended to diagnose MRSA infection nor to guide or monitor treatment for MRSA infections. Performed at [facility], [place] [address] [place], [place] [address]          Radiology Studies: Dg Chest Port 1 View  Result Date: 03/30/2018 CLINICAL DATA:  Left leg edema and weakness EXAM:  PORTABLE CHEST 1 VIEW COMPARISON:  03/11/2018 FINDINGS: Chronic cardiomegaly. Atherosclerotic calcification. Pleural effusions on the right more than left. The left pleural effusion is small. The right pleural effusion is small to moderate. The underlying lungs are obscured. No Kerley lines. No pneumothorax. Central vascular congestion. Severe glenohumeral osteoarthritis. IMPRESSION: Cardiomegaly with vascular congestion and right more than left pleural effusion. Electronically Signed   By: Monte Fantasia M.D.   On: 03/30/2018 18:20        Scheduled Meds: . ALPRAZolam  1 mg Oral QHS  . amiodarone  200 mg Oral Daily  . apixaban  2.5 mg Oral BID  . chlorhexidine  15 mL Mouth Rinse BID  . furosemide  80 mg Intravenous BID  . linaclotide  290 mcg Oral QAC breakfast  . mouth rinse  15 mL Mouth Rinse q12n4p  . polyethylene glycol  17 g Oral QHS  . pravastatin  20 mg Oral q1800  . sodium chloride flush  3 mL Intravenous Q12H  . sodium chloride flush  3 mL Intravenous Q12H   Continuous Infusions: . sodium chloride    . sodium chloride       LOS: 2 days     Vernell Leep, MD, FACP, [facility]. [facility] Pager [phone]  If 7PM-7AM, please contact night-coverage [email or web address] Password [facility] 04/01/2018, 3:05 PM

## 2018-04-01 NOTE — CV Procedure (Signed)
Brief TEE Note  LVEF 20% Diffuse hypokinesis No LA/LAA thrombus or mass Mild-moderate mitral regurgitation with multiple jets Mild TR, PR and AR Small posterior pericardial effusion L pleural effusion noted Moderate atherosclerosis of the descending aorta Severe enlargement of the LA.  For additional details see full report.  Electrical Cardioversion Procedure Note [facility] [id number] 02/21/1920  Procedure: Electrical Cardioversion Indications:  Atrial Fibrillation  Procedure Details Consent: Risks of procedure as well as the alternatives and risks of each were explained to the (patient/caregiver).  Consent for procedure obtained. Time Out: Verified patient identification, verified procedure, site/side was marked, verified correct patient position, special equipment/implants available, medications/allergies/relevent history reviewed, required imaging and test results available.  Performed  Patient placed on cardiac monitor, pulse oximetry, supplemental oxygen as necessary.  Sedation given: propofol. Pacer pads placed anterior and posterior chest.  Cardioverted 1 time(s).  Cardioverted at 150J.  Evaluation Findings: Post procedure EKG shows: sinus rhythm with PACs and PVCs Complications: None Patient did tolerate procedure well.   Skeet Latch, MD 04/01/2018, 1:31 PM

## 2018-04-01 NOTE — Progress Notes (Signed)
  Echocardiogram Echocardiogram Transesophageal has been performed.  Jannett Celestine 04/01/2018, 2:25 PM

## 2018-04-01 NOTE — Progress Notes (Addendum)
Progress Note  Patient Name: [name] [name] Date of Encounter: 04/01/2018  Primary Cardiologist: Dr. Percival Spanish   Subjective   Patient doing well this morning.  Anticipating cardioversion later today.  Denies chest pain, shortness of breath.  Inpatient Medications    Scheduled Meds: . ALPRAZolam  1 mg Oral QHS  . amiodarone  200 mg Oral Daily  . apixaban  2.5 mg Oral BID  . chlorhexidine  15 mL Mouth Rinse BID  . furosemide  40 mg Intravenous BID  . linaclotide  290 mcg Oral QAC breakfast  . mouth rinse  15 mL Mouth Rinse q12n4p  . polyethylene glycol  17 g Oral QHS  . pravastatin  20 mg Oral q1800  . sodium chloride flush  3 mL Intravenous Q12H  . sodium chloride flush  3 mL Intravenous Q12H   Continuous Infusions: . sodium chloride    . sodium chloride     PRN Meds: sodium chloride, acetaminophen, albuterol, ondansetron (ZOFRAN) IV, sodium chloride flush, sodium chloride flush   Vital Signs    Vitals:   04/01/18 0500 04/01/18 0530 04/01/18 0600 04/01/18 0800  BP: 122/74  113/74 125/85  Pulse: 81   (!) 134  Resp: (!) 22 (!) 21 19 (!) 24  Temp:    (!) 97.4 F (36.3 C)  TempSrc:    Oral  SpO2: 92% 91% 91% 93%  Weight: 189 lb 9.5 oz (86 kg)     Height:        Intake/Output Summary (Last 24 hours) at 04/01/2018 0932 Last data filed at 04/01/2018 0600 Gross per 24 hour  Intake 935.67 ml  Output 700 ml  Net 235.67 ml   Filed Weights   03/30/18 2226 03/31/18 0530 04/01/18 0500  Weight: 192 lb 0.3 oz (87.1 kg) 192 lb 0.3 oz (87.1 kg) 189 lb 9.5 oz (86 kg)    Physical Exam   General: Elderly, NAD Skin: Warm, dry, intact  Head: Normocephalic, atraumatic, clear, moist mucus membranes. Neck: Negative for carotid bruits. No JVD Lungs:Clear to ausculation bilaterally. No wheezes, rales, or rhonchi. Breathing is unlabored. Cardiovascular: Irregularly irregular with S1 S2. No murmurs, rubs or gallops Abdomen: Soft, non-tender, non-distended with normoactive bowel  sounds. No obvious abdominal masses. MSK: Strength and tone appear normal for age. 5/5 in all extremities Extremities: 2+ BLE edema. No clubbing or cyanosis. DP/PT pulses 1+ bilaterally Neuro: Alert and oriented. No focal deficits. No facial asymmetry. MAE spontaneously. Psych: Responds to questions appropriately with normal affect.    Labs    Chemistry Recent Labs  Lab 03/25/18 1444 03/30/18 1749 03/31/18 0646 04/01/18 0313  NA 143 144 144 139  K 4.4 4.6 4.1 5.5*  CL 104 103 103 100*  CO2 29 28 28 27   GLUCOSE 93 104* 108* 119*  BUN 25* 31* 30* 32*  CREATININE 1.64* 1.87* 1.69* 1.90*  CALCIUM 8.9 9.0 8.8* 8.4*  PROT 6.4 6.7  --   --   ALBUMIN 3.7 3.7  --   --   AST 23 30  --   --   ALT 26 34  --   --   ALKPHOS 79 81  --   --   BILITOT 1.6* 2.6*  --   --   GFRNONAA  --  29* 32* 28*  GFRAA  --  33* 38* 33*  ANIONGAP  --  13 13 12      Hematology Recent Labs  Lab 03/25/18 1444 03/30/18 1749  WBC 6.5 6.2  RBC 5.11 5.22  HGB 16.3 16.7  HCT 50.0 52.2*  MCV 97.7 100.0  MCH  --  32.0  MCHC 32.5 32.0  RDW 16.1* 15.9*  PLT 110.0* 146*    Cardiac Enzymes Recent Labs  Lab 03/30/18 2244 03/31/18 0646  TROPONINI 0.03* 0.03*    Recent Labs  Lab 03/30/18 1804  TROPIPOC 0.06     BNP Recent Labs  Lab 03/30/18 1750  BNP 758.0*     DDimer No results for input(s): DDIMER in the last 168 hours.   Radiology    Dg Chest Port 1 View  Result Date: 03/30/2018 CLINICAL DATA:  Left leg edema and weakness EXAM: PORTABLE CHEST 1 VIEW COMPARISON:  03/11/2018 FINDINGS: Chronic cardiomegaly. Atherosclerotic calcification. Pleural effusions on the right more than left. The left pleural effusion is small. The right pleural effusion is small to moderate. The underlying lungs are obscured. No Kerley lines. No pneumothorax. Central vascular congestion. Severe glenohumeral osteoarthritis. IMPRESSION: Cardiomegaly with vascular congestion and right more than left pleural effusion.  Electronically Signed   By: Monte Fantasia M.D.   On: 03/30/2018 18:20   Telemetry    04/01/18 Atrial fibrillation HR 109 - Personally Reviewed  ECG    No new tracings data 04/01/2018- Personally Reviewed  Cardiac Studies   2D Echo 04/07/18 Study Conclusions  - Left ventricle: The cavity size was normal. There was mild concentric hypertrophy. Systolic function was severely reduced. The estimated ejection fraction was in the range of 25% to 30%. Severe diffuse hypokinesis. Although no diagnostic regional wall motion abnormality was identified, this possibility cannot be completely excluded on the basis of this study. - Aortic valve: There was mild regurgitation. - Mitral valve: There was mild regurgitation directed centrally. - Left atrium: The atrium was severely dilated. - Right atrium: The atrium was severely dilated. - Pulmonary arteries: Systolic pressure was mildly increased. PA peak pressure: 48 mm Hg (S).  Patient Profile     84 y.o. male with a hx of cardiomyopathy/ chronic systolic HF with EF of 25-42% (presumed to be tachy mediated), PAF w/ prior cardioversion and treatment w/ amiodarone and Eliquis, h/o CKD and recent outpatient treatment for presumed CAP, who is being seen today for the evaluation of acute on chronic systolic HF and atrial fibrillation w/ RVR at the request of Dr. Wendee Beavers, Internal Medicine.  Assessment & Plan    1.  Acute on chronic systolic congestive heart failure: -Echocardiogram LVEF 25 to 30% presumed to be tachycardia mediated by rapid atrial fibrillation. -Patient remains actively fluid volume overloaded on examination with lower extremity edema, 2+ pitting -Continue IV Lasix 40 mg twice daily -Fluid restrict to 1.5 L/day, low-sodium diet -Weight, 189lb today, 192lb on admission 03/30/2018 -I&O, net -538 mL, total 24-hour output 700 mL -Creatinine, 1.90 today up from 1.69 on 03/31/2018 -No ACE or ARB secondary to worsening renal  function  2.  Persistent atrial fibrillation: -IV diltiazem with moderate rate control on admission>>>dicontinued on 03/31/18 with anticipation of DCCV and hx of bradycardia with NSR -Amiodarone 200 mg added 03/31/2018 -Low-dose Eliquis 2.5 g twice daily -Patient for TEE cardioversion today, 04/01/2018 -K+ elevated today, see plan below -TSH, 1.681  3.  Chronic kidney disease stage III: -Creatinine, 1.90 today, up from 1.69 03/31/2018 -Recent baseline appears to be in the 1.6 range, with elevations up to 2.2 in 2018 -Monitor closely with BMET  4.  Cardiomyopathy: -Per echocardiogram LVEF 25 to 30% presumed to be tachycardia mediated secondary to rapid atrial  fibrillation -Avoid beta-blocker secondary to bradycardia once conversion to NSR -Unable to add ARB/ACE-I secondary to worsening renal function  5.  Hyperkalemia: -K+, 5.5 today>>> no additional  K supplementation today and recheck will BMET in the AM monitor telemetry closely   Signed, Kathyrn Drown NP-C [facility] Pager: [phone] 04/01/2018, 9:32 AM   For questions or updates, please contact   Please consult [email or web address] for contact info under Cardiology/STEMI.  As above, patient seen and examined.  Mild dyspnea this morning but he states improved.  No chest pain.  On exam he remains significantly volume overloaded.  I will increase Lasix to 80 mg IV twice daily.  Follow renal function and potassium closely.  I think CHF decompensation is likely at least partially related to recurrent atrial fibrillation.  Amiodarone has been increased to 200 mg daily.  We will continue with apixaban.  Patient is unclear whether he has taken each dose over the past 3 weeks.  We have therefore arranged for TEE guided cardioversion later today.  Hopefully reestablishing sinus rhythm will improve congestive heart failure.  He has had bradycardia when in sinus in the past.  His Cardizem was discontinued last evening at 10 PM.  His heart rate this  morning is approximately 120.  Follow closely for bradycardia after cardioversion.  Kirk Ruths, MD

## 2018-04-01 NOTE — Transfer of Care (Signed)
Immediate Anesthesia Transfer of Care Note  Patient: [name] [name]  Procedure(s) Performed: TRANSESOPHAGEAL ECHOCARDIOGRAM (TEE) (N/A ) CARDIOVERSION (N/A )  Patient Location: Endoscopy Unit  Anesthesia Type:MAC  Level of Consciousness: drowsy  Airway & Oxygen Therapy: Patient Spontanous Breathing and Patient connected to nasal cannula oxygen  Post-op Assessment: Report given to RN and Post -op Vital signs reviewed and stable  Post vital signs: Reviewed and stable  Last Vitals:  Vitals Value Taken Time  BP    Temp    Pulse    Resp    SpO2      Last Pain:  Vitals:   04/01/18 1135  TempSrc: Oral  PainSc: 0-No pain      Patients Stated Pain Goal: 0 (13/24/40 1027)  Complications: No apparent anesthesia complications

## 2018-04-01 NOTE — Anesthesia Postprocedure Evaluation (Signed)
Anesthesia Post Note  Patient: [name] [name]  Procedure(s) Performed: TRANSESOPHAGEAL ECHOCARDIOGRAM (TEE) (N/A ) CARDIOVERSION (N/A )     Patient location during evaluation: PACU Anesthesia Type: General Level of consciousness: awake and alert Pain management: pain level controlled Vital Signs Assessment: post-procedure vital signs reviewed and stable Respiratory status: spontaneous breathing, nonlabored ventilation, respiratory function stable and patient connected to nasal cannula oxygen Cardiovascular status: blood pressure returned to baseline and stable Postop Assessment: no apparent nausea or vomiting Anesthetic complications: no    Last Vitals:  Vitals:   04/01/18 1430 04/01/18 1445  BP: 125/84 128/68  Pulse: (!) 59 66  Resp: (!) 27 20  Temp: (!) 36.3 C   SpO2: 91% 92%    Last Pain:  Vitals:   04/01/18 1430  TempSrc: Oral  PainSc: 0-No pain                 Tiajuana Amass

## 2018-04-01 NOTE — Anesthesia Procedure Notes (Signed)
Procedure Name: MAC Date/Time: 04/01/2018 1:06 PM Performed by: Candis Shine, CRNA Pre-anesthesia Checklist: Patient identified, Emergency Drugs available, Suction available, Patient being monitored and Timeout performed Patient Re-evaluated:Patient Re-evaluated prior to induction Oxygen Delivery Method: Nasal cannula Dental Injury: Teeth and Oropharynx as per pre-operative assessment

## 2018-04-01 NOTE — Anesthesia Preprocedure Evaluation (Signed)
Anesthesia Evaluation  Patient identified by MRN, date of birth, ID band Patient awake    Reviewed: Allergy & Precautions, NPO status , Patient's Chart, lab work & pertinent test results  Airway Mallampati: II  TM Distance: >3 FB Neck ROM: Full    Dental   Pulmonary pneumonia,    breath sounds clear to auscultation       Cardiovascular hypertension, Pt. on medications + CAD, +CHF and + DOE  + dysrhythmias Atrial Fibrillation  Rhythm:Irregular Rate:Tachycardia     Neuro/Psych Anxiety Depression negative neurological ROS     GI/Hepatic negative GI ROS, Neg liver ROS,   Endo/Other  negative endocrine ROS  Renal/GU CRFRenal disease     Musculoskeletal  (+) Arthritis ,   Abdominal   Peds  Hematology negative hematology ROS (+)   Anesthesia Other Findings   Reproductive/Obstetrics                             Anesthesia Physical Anesthesia Plan  ASA: III  Anesthesia Plan: MAC   Post-op Pain Management:    Induction: Intravenous  PONV Risk Score and Plan: 1 and Propofol infusion, Ondansetron and Treatment may vary due to age or medical condition  Airway Management Planned:   Additional Equipment:   Intra-op Plan:   Post-operative Plan:   Informed Consent: I have reviewed the patients History and Physical, chart, labs and discussed the procedure including the risks, benefits and alternatives for the proposed anesthesia with the patient or authorized representative who has indicated his/her understanding and acceptance.     Plan Discussed with: CRNA  Anesthesia Plan Comments:         Anesthesia Quick Evaluation

## 2018-04-02 ENCOUNTER — Encounter (HOSPITAL_COMMUNITY): Payer: Self-pay | Admitting: Cardiovascular Disease

## 2018-04-02 ENCOUNTER — Other Ambulatory Visit: Payer: Self-pay

## 2018-04-02 DIAGNOSIS — I4891 Unspecified atrial fibrillation: Secondary | ICD-10-CM

## 2018-04-02 LAB — CBC
HEMATOCRIT: 49.2 % (ref 39.0–52.0)
Hemoglobin: 15.9 g/dL (ref 13.0–17.0)
MCH: 31.9 pg (ref 26.0–34.0)
MCHC: 32.3 g/dL (ref 30.0–36.0)
MCV: 98.8 fL (ref 78.0–100.0)
Platelets: 127 10*3/uL — ABNORMAL LOW (ref 150–400)
RBC: 4.98 MIL/uL (ref 4.22–5.81)
RDW: 15.6 % — AB (ref 11.5–15.5)
WBC: 6.3 10*3/uL (ref 4.0–10.5)

## 2018-04-02 LAB — BASIC METABOLIC PANEL WITH GFR
Anion gap: 13 (ref 5–15)
BUN: 32 mg/dL — ABNORMAL HIGH (ref 6–20)
CO2: 29 mmol/L (ref 22–32)
Calcium: 8.4 mg/dL — ABNORMAL LOW (ref 8.9–10.3)
Chloride: 99 mmol/L — ABNORMAL LOW (ref 101–111)
Creatinine, Ser: 1.85 mg/dL — ABNORMAL HIGH (ref 0.61–1.24)
GFR calc Af Amer: 34 mL/min — ABNORMAL LOW
GFR calc non Af Amer: 29 mL/min — ABNORMAL LOW
Glucose, Bld: 111 mg/dL — ABNORMAL HIGH (ref 65–99)
Potassium: 3.7 mmol/L (ref 3.5–5.1)
Sodium: 141 mmol/L (ref 135–145)

## 2018-04-02 LAB — URINE CULTURE

## 2018-04-02 LAB — HEPATIC FUNCTION PANEL
ALT: 21 U/L (ref 17–63)
AST: 15 U/L (ref 15–41)
Albumin: 3.4 g/dL — ABNORMAL LOW (ref 3.5–5.0)
Alkaline Phosphatase: 65 U/L (ref 38–126)
Bilirubin, Direct: 0.4 mg/dL (ref 0.1–0.5)
Indirect Bilirubin: 3.2 mg/dL — ABNORMAL HIGH (ref 0.3–0.9)
Total Bilirubin: 3.6 mg/dL — ABNORMAL HIGH (ref 0.3–1.2)
Total Protein: 6.1 g/dL — ABNORMAL LOW (ref 6.5–8.1)

## 2018-04-02 MED ORDER — ORAL CARE MOUTH RINSE
15.0000 mL | Freq: Two times a day (BID) | OROMUCOSAL | Status: DC
  Administered 2018-04-02 – 2018-04-04 (×4): 15 mL via OROMUCOSAL

## 2018-04-02 MED ORDER — FUROSEMIDE 10 MG/ML IJ SOLN
80.0000 mg | Freq: Two times a day (BID) | INTRAMUSCULAR | Status: DC
  Administered 2018-04-02 – 2018-04-04 (×4): 80 mg via INTRAVENOUS
  Filled 2018-04-02 (×4): qty 8

## 2018-04-02 MED ORDER — AMIODARONE HCL 200 MG PO TABS
400.0000 mg | ORAL_TABLET | Freq: Two times a day (BID) | ORAL | Status: DC
  Administered 2018-04-02 – 2018-04-03 (×3): 400 mg via ORAL
  Filled 2018-04-02 (×3): qty 2

## 2018-04-02 NOTE — Progress Notes (Addendum)
Progress Note  Patient Name: [name] [name] Date of Encounter: 04/02/2018  Primary Cardiologist: Dr. Percival Spanish   Subjective   Pt feeling well today. Up talking on the phone. No complaints. Reports breathing better   Inpatient Medications    Scheduled Meds: . ALPRAZolam  1 mg Oral QHS  . amiodarone  200 mg Oral Daily  . apixaban  2.5 mg Oral BID  . chlorhexidine  15 mL Mouth Rinse BID  . furosemide  80 mg Intravenous BID  . linaclotide  290 mcg Oral QAC breakfast  . mouth rinse  15 mL Mouth Rinse q12n4p  . polyethylene glycol  17 g Oral QHS  . pravastatin  20 mg Oral q1800  . sodium chloride flush  3 mL Intravenous Q12H  . sodium chloride flush  3 mL Intravenous Q12H   Continuous Infusions: . sodium chloride    . sodium chloride     PRN Meds: sodium chloride, acetaminophen, albuterol, ondansetron (ZOFRAN) IV, sodium chloride flush, sodium chloride flush   Vital Signs    Vitals:   04/02/18 0500 04/02/18 0600 04/02/18 0715 04/02/18 0800  BP: 127/66 120/70  (!) 142/74  Pulse: 61 (!) 59  (!) 59  Resp: 18 16  20   Temp:   (!) 97.5 F (36.4 C)   TempSrc:   Oral   SpO2: 100% 99%  100%  Weight:  191 lb 5.8 oz (86.8 kg)    Height:        Intake/Output Summary (Last 24 hours) at 04/02/2018 0916 Last data filed at 04/02/2018 0800 Gross per 24 hour  Intake 400 ml  Output 1775 ml  Net -1375 ml   Filed Weights   03/31/18 0530 04/01/18 0500 04/02/18 0600  Weight: 192 lb 0.3 oz (87.1 kg) 189 lb 9.5 oz (86 kg) 191 lb 5.8 oz (86.8 kg)    Physical Exam   General: Elderly, NAD Skin: Warm, dry, intact  Head: Normocephalic, atraumatic,  clear, moist mucus membranes. Neck: Negative for carotid bruits. No JVD Lungs:Clear to ausculation bilaterally. No wheezes, rales, or rhonchi. Breathing is unlabored. Cardiovascular: RRR with S1 S2. No murmurs, rubs or gallops Abdomen: Soft, non-tender, non-distended with normoactive bowel sounds. No obvious abdominal masses. MSK:  Strength and tone appear normal for age. 5/5 in all extremities Extremities: 2+ BLE edema. No clubbing or cyanosis. DP/PT pulses 1+ bilaterally Neuro: Alert and oriented. No focal deficits. No facial asymmetry. MAE spontaneously. Psych: Responds to questions appropriately with normal affect.    Labs    Chemistry Recent Labs  Lab 03/30/18 1749 03/31/18 0646 04/01/18 0313 04/02/18 0320  NA 144 144 139 141  K 4.6 4.1 5.5* 3.7  CL 103 103 100* 99*  CO2 28 28 27 29   GLUCOSE 104* 108* 119* 111*  BUN 31* 30* 32* 32*  CREATININE 1.87* 1.69* 1.90* 1.85*  CALCIUM 9.0 8.8* 8.4* 8.4*  PROT 6.7  --   --  6.1*  ALBUMIN 3.7  --   --  3.4*  AST 30  --   --  15  ALT 34  --   --  21  ALKPHOS 81  --   --  65  BILITOT 2.6*  --   --  3.6*  GFRNONAA 29* 32* 28* 29*  GFRAA 33* 38* 33* 34*  ANIONGAP 13 13 12 13      Hematology Recent Labs  Lab 03/30/18 1749 04/02/18 0320  WBC 6.2 6.3  RBC 5.22 4.98  HGB 16.7 15.9  HCT  52.2* 49.2  MCV 100.0 98.8  MCH 32.0 31.9  MCHC 32.0 32.3  RDW 15.9* 15.6*  PLT 146* 127*    Cardiac Enzymes Recent Labs  Lab 03/30/18 2244 03/31/18 0646  TROPONINI 0.03* 0.03*    Recent Labs  Lab 03/30/18 1804  TROPIPOC 0.06     BNP Recent Labs  Lab 03/30/18 1750  BNP 758.0*     DDimer No results for input(s): DDIMER in the last 168 hours.   Radiology    No results found.  Telemetry    04/02/18 NSR HR 66 - Personally Reviewed  ECG     04/01/18 NSR with PAC, 1st degree AV block and RBBB- Personally Reviewed  Cardiac Studies   2D Echo 04/07/18 Study Conclusions  - Left ventricle: The cavity size was normal. There was mild concentric hypertrophy. Systolic function was severely reduced. The estimated ejection fraction was in the range of 25% to 30%. Severe diffuse hypokinesis. Although no diagnostic regional wall motion abnormality was identified, this possibility cannot be completely excluded on the basis of this study. -  Aortic valve: There was mild regurgitation. - Mitral valve: There was mild regurgitation directed centrally. - Left atrium: The atrium was severely dilated. - Right atrium: The atrium was severely dilated. - Pulmonary arteries: Systolic pressure was mildly increased. PA peak pressure: 48 mm Hg (S).  Patient Profile     84 y.o. male a hx of cardiomyopathy/ chronic systolic HF with EF of 17-61% (presumed to be tachy mediated), PAF w/ prior cardioversion and treatment w/ amiodarone and Eliquis, h/o CKD and recent outpatient treatment for presumed CAP,who is being seen today for the evaluation ofacute on chronic systolic HF and atrial fibrillation w/ RVRat the request of Dr. Wendee Beavers, Internal Medicine.  Assessment & Plan    1.  Acute on chronic systolic congestive heart failure: -Echocardiogram LVEF 25 to 30% presumed to be tachycardia mediated by rapid atrial fibrillation. -Patient remains  fluid volume overloaded on examination with lower extremity edema, 2+ pitting -Continue IV Lasix 40 mg twice daily -Fluid restrict to 1.5 L/day, low-sodium diet -Weight, 191lb today,  Up from 189lb on 04/01/18  -I&O, net -1.1L on 04/01/18, total 24-hour output 1.5L -Creatinine, 1.85 today down from 1.90 on 04/01/2018 -No ACE or ARB secondary to renal function  2.  Persistent atrial fibrillation: -Successful DCCV on 04/01/18. Remains in NSR this AM. No signs of SB on tele review. Pt had one episode this AM on ST to the 140's, but quickly recovered back to NSR HR 60's.   -Amiodarone 200 mg added 03/31/2018 -Low-dose Eliquis 2.5 g twice daily -K+ stable today, 3.7 -TSH, 1.681  3.  Chronic kidney disease stage III: -Creatinine, 1.85 today, down from 1.90 04/01/2018 -Recent baseline appears to be in the 1.6 range, with elevations up to 2.2 in 2018 -Monitor closely with BMET  4.  Cardiomyopathy: -Per echocardiogram LVEF 25 to 30% presumed to be tachycardia mediated secondary to rapid atrial  fibrillation -Avoid beta-blocker secondary to bradycardia once conversion to NSR -Unable to add ARB/ACE-I secondary to worsening renal function  5.  Hyperkalemia: -Resolved, K+ 3.7 today    Signed, Kathyrn Drown NP-C [facility] Pager: [phone] 04/02/2018, 9:16 AM    For questions or updates, please contact   Please consult [email or web address] for contact info under Cardiology/STEMI.  As above, patient seen and examined.  Patient denies dyspnea this morning and overall feels improved.  No chest pain, palpitations or syncope.  He underwent TEE guided cardioversion yesterday with  restoration of sinus rhythm.  On telemetry he is noted to have sinus rhythm with episodes of PAT and nonsustained ventricular tachycardia.  Personally reviewed.  I will increase amiodarone to 400 mg twice daily while in the hospital and then resume 200 mg daily at discharge.  Continue apixaban.  He remains volume overloaded but improving.  Continue Lasix at present dose and follow renal function.  His congestive heart failure hopefully will also improve now that sinus reestablished.  Patient can be transferred to telemetry from a cardiac standpoint.  Possible discharge 24 to 48 hours.  Note patient had significant bradycardia with addition of beta-blocker in the past.  We will therefore avoid.  We will consider adding an ARB later if blood pressure and renal function allow.  Kirk Ruths, MD

## 2018-04-02 NOTE — Progress Notes (Signed)
PROGRESS NOTE   [name] [name]  VHQ:[id number]    DOB: 05-19-1920    DOA: 03/30/2018  PCP: Cassandria Anger, MD   I have briefly reviewed patients previous medical records in [facility].  Brief Narrative:  84 year old male with PMH of cardiomyopathy (tachycardia mediated), chronic systolic CHF (LVEF 25- 41%), PAF with prior cardioversion and on amiodarone and Eliquis, stage III chronic kidney disease (baseline creatinine 1.6-2), right eye blindness, anxiety and depression, HTN, HLD, recently treated for CAP 2 weeks PTA, presented to ED due to progressive lower extremity edema and exertional dyspnea.  Admitted for acute on chronic systolic CHF.  Cardiology was consulted and underwent successful TEE cardioversion 04/01/2018 at [facility].  Improving and stable.  Transferred to telemetry 5/16.  Possible discharge in 24-48 hours.  PT evaluation pending.   Assessment & Plan:   Active Problems:   Essential hypertension   CKD (chronic kidney disease) stage 3, GFR 30-59 ml/min (HCC)   Systolic CHF, chronic (HCC)   Atrial fibrillation with RVR (HCC)   Anxiety   Acute on chronic systolic CHF/cardiomyopathy: TTE 03/31/2018: LVEF 20-25% with diffuse hypokinesis, worse than prior.  Cardiology consulted and felt that the cardiomyopathy was related to tachycardia.  Treating with IV Lasix, dose increased and currently 80 mg every 12 hourly.  -2.093 mL since admission.  Weight fluctuating, up again from 189 to 191 pounds, probably not accurate.  Remains volume overloaded but improving.  No ACEI/ARB due to renal insufficiency and hyperkalemia but may consider adding later if BP and renal function allow.  Continue current dose of Lasix.  Persistent atrial fibrillation: Cardiology was consulted.  Amiodarone increased to 200 mg daily.  IV Cardizem drip discontinued 5/14 around 10 PM in anticipation of DCCV.  Continue low-dose Eliquis 2.5 mg twice daily.  TSH normal. He is status post TEE and successful cardioversion  5/15.  Remains in sinus rhythm post cardioversion.  As per cardiology follow-up, amiodarone increased to 400 mg twice daily while in the hospital and then resume 200 mg daily at discharge.  Of note patient had significant bradycardia with addition of beta-blocker in the past and therefore avoiding same.  Stage III chronic kidney disease: Recent baseline creatinine appears to be 1.6 range.  Creatinine has decreased from 1.9-1.85.  Likely related to diuresis.  Follow BMP closely.  Hyperkalemia: Not on potassium supplements or ACEI/ARB.  Change diet to low potassium diet.  Continue IV Lasix.  Resolved.  Essential hypertension: Controlled.  Hyperlipidemia: Continue pravastatin.  Anxiety: Continue bedtime scheduled Xanax.  Isolated hyperbilirubinemia: Unclear etiology.?  Passive hepatic congestion.  Total bilirubin has increased from 2.6-3.6 and predominantly indirect hyperbilirubinemia.  NSVT: Continue amiodarone.  Monitor on telemetry.  Magnesium 2.    DVT prophylaxis: Eliquis Code Status: Full Family Communication: None at bedside Disposition: Admitted to stepdown.  Transferred to telemetry on 5/16.  DC likely in 24-48 hours, site pending PT evaluation.  Consultants:  Cardiology  Procedures:  TEE and cardioversion 04/01/2018:  Brief TEE Note  LVEF 20% Diffuse hypokinesis No LA/LAA thrombus or mass Mild-moderate mitral regurgitation with multiple jets Mild TR, PR and AR Small posterior pericardial effusion L pleural effusion noted Moderate atherosclerosis of the descending aorta Severe enlargement of the LA.    Antimicrobials:  None   Subjective: Reports feeling better.  Interviewed and examined with RN in room.  Dyspnea improved and denies dyspnea.  Leg swelling slowly improving.  No palpitations or chest pain.  ROS: As above.  Objective:  Vitals:  04/02/18 1000 04/02/18 1105 04/02/18 1200 04/02/18 1220  BP: (!) 123/59   (!) 145/76  Pulse: 60  65 66  Resp: 18   20 (!) 23  Temp:  97.6 F (36.4 C)    TempSrc:  Oral    SpO2: 98%  99% 99%  Weight:      Height:        Examination:  General exam: Pleasant elderly male, moderately built and overweight lying comfortably propped up in bed. Respiratory system: Clear to auscultation.  No increased work of breathing. Cardiovascular system: S1 and S2 heard, RRR.  No JVD or murmurs.  1+ pitting bilateral leg edema.  Telemetry personally reviewed: Sinus rhythm.  Noted 21 beat NSVT on 5/15 at 5:28 PM. Gastrointestinal system: Abdomen is nondistended, soft and nontender. No organomegaly or masses felt. Normal bowel sounds heard.  Stable Central nervous system: Alert and oriented. No focal neurological deficits.  Stable Extremities: Symmetric 5 x 5 power.  Stable Skin: No rashes, lesions or ulcers Psychiatry: Judgement and insight appear normal. Mood & affect appropriate.  Eyes: Corneal opacity right eye with blindness.    Data Reviewed: I have personally reviewed following labs and imaging studies  CBC: Recent Labs  Lab 03/30/18 1749 04/02/18 0320  WBC 6.2 6.3  NEUTROABS 4.6  --   HGB 16.7 15.9  HCT 52.2* 49.2  MCV 100.0 98.8  PLT 146* 269*   Basic Metabolic Panel: Recent Labs  Lab 03/30/18 1749 03/30/18 2244 03/31/18 0646 04/01/18 0313 04/02/18 0320  NA 144  --  144 139 141  K 4.6  --  4.1 5.5* 3.7  CL 103  --  103 100* 99*  CO2 28  --  28 27 29   GLUCOSE 104*  --  108* 119* 111*  BUN 31*  --  30* 32* 32*  CREATININE 1.87*  --  1.69* 1.90* 1.85*  CALCIUM 9.0  --  8.8* 8.4* 8.4*  MG  --  2.2 2.0  --   --    Liver Function Tests: Recent Labs  Lab 03/30/18 1749 04/02/18 0320  AST 30 15  ALT 34 21  ALKPHOS 81 65  BILITOT 2.6* 3.6*  PROT 6.7 6.1*  ALBUMIN 3.7 3.4*   Coagulation Profile: Recent Labs  Lab 03/30/18 2244  INR 1.32   Cardiac Enzymes: Recent Labs  Lab 03/30/18 2244 03/31/18 0646  TROPONINI 0.03* 0.03*   HbA1C: Recent Labs    03/31/18 0646  HGBA1C 5.7*    CBG: No results for input(s): GLUCAP in the last 168 hours.  Recent Results (from the past 240 hour(s))  Blood culture (routine x 2)     Status: None (Preliminary result)   Collection Time: 03/30/18  5:50 PM  Result Value Ref Range Status   Specimen Description   Final    BLOOD LEFT ANTECUBITAL Performed at [facility] [address] [place], [place] [address]    Special Requests   Final    BOTTLES DRAWN AEROBIC AND ANAEROBIC Blood Culture adequate volume Performed at [facility] [address], [place], [place] [address]    Culture   Final    NO GROWTH 3 DAYS Performed at [facility], [place] [address], [place], [place] [address]    Report Status PENDING  Incomplete  Urine culture     Status: Abnormal   Collection Time: 03/30/18  5:50 PM  Result Value Ref Range Status   Specimen Description   Final  URINE, RANDOM Performed at [facility], [place] [address], [place], [place] [address]    Special Requests   Final    NONE Performed at [facility], [place] [address], [place], [place] [address]    Culture 40,000 COLONIES/mL ESCHERICHIA COLI (A)  Final   Report Status 04/02/2018 FINAL  Final   Organism ID, Bacteria ESCHERICHIA COLI (A)  Final      Susceptibility   Escherichia coli - MIC*    AMPICILLIN 8 SENSITIVE Sensitive     CEFAZOLIN <=4 SENSITIVE Sensitive     CEFTRIAXONE <=1 SENSITIVE Sensitive     CIPROFLOXACIN <=0.25 SENSITIVE Sensitive     GENTAMICIN <=1 SENSITIVE Sensitive     IMIPENEM <=0.25 SENSITIVE Sensitive     NITROFURANTOIN <=16 SENSITIVE Sensitive     TRIMETH/SULFA <=20 SENSITIVE Sensitive     AMPICILLIN/SULBACTAM 4 SENSITIVE Sensitive     PIP/TAZO <=4 SENSITIVE Sensitive     Extended ESBL NEGATIVE Sensitive     * 40,000 COLONIES/mL ESCHERICHIA COLI  Blood culture (routine x 2)     Status: None (Preliminary result)   Collection Time: 03/30/18  5:58 PM  Result Value Ref  Range Status   Specimen Description   Final    BLOOD RIGHT FOREARM Performed at [facility] [address], [place], [place] [address]    Special Requests   Final    BOTTLES DRAWN AEROBIC AND ANAEROBIC Blood Culture adequate volume Performed at [facility] [address], [place], [place] [address]    Culture   Final    NO GROWTH 3 DAYS Performed at [facility], [place] [address] [place], [place] [address]    Report Status PENDING  Incomplete  MRSA PCR Screening     Status: None   Collection Time: 03/30/18 10:24 PM  Result Value Ref Range Status   MRSA by PCR NEGATIVE NEGATIVE Final    Comment:        The GeneXpert MRSA Assay (FDA approved for NASAL specimens only), is one component of a comprehensive MRSA colonization surveillance program. It is not intended to diagnose MRSA infection nor to guide or monitor treatment for MRSA infections. Performed at [facility], [place] [address] [place], [place] [address]          Radiology Studies: No results found.      Scheduled Meds: . ALPRAZolam  1 mg Oral QHS  . amiodarone  400 mg Oral BID  . apixaban  2.5 mg Oral BID  . furosemide  80 mg Intravenous BID  . linaclotide  290 mcg Oral QAC breakfast  . mouth rinse  15 mL Mouth Rinse BID  . polyethylene glycol  17 g Oral QHS  . pravastatin  20 mg Oral q1800  . sodium chloride flush  3 mL Intravenous Q12H  . sodium chloride flush  3 mL Intravenous Q12H   Continuous Infusions: . sodium chloride    . sodium chloride       LOS: 3 days     Vernell Leep, MD, FACP, [facility]. [facility] Pager [phone]  If 7PM-7AM, please contact night-coverage [email or web address] Password [email or web address] 04/02/2018, 1:48 PM

## 2018-04-03 DIAGNOSIS — E876 Hypokalemia: Secondary | ICD-10-CM

## 2018-04-03 LAB — BASIC METABOLIC PANEL
ANION GAP: 13 (ref 5–15)
BUN: 37 mg/dL — ABNORMAL HIGH (ref 6–20)
CALCIUM: 8.6 mg/dL — AB (ref 8.9–10.3)
CO2: 34 mmol/L — AB (ref 22–32)
Chloride: 94 mmol/L — ABNORMAL LOW (ref 101–111)
Creatinine, Ser: 1.87 mg/dL — ABNORMAL HIGH (ref 0.61–1.24)
GFR calc non Af Amer: 29 mL/min — ABNORMAL LOW (ref >60)
GFR, EST AFRICAN AMERICAN: 33 mL/min — AB (ref >60)
Glucose, Bld: 108 mg/dL — ABNORMAL HIGH (ref 65–99)
POTASSIUM: 3.4 mmol/L — AB (ref 3.5–5.1)
Sodium: 141 mmol/L (ref 135–145)

## 2018-04-03 MED ORDER — POTASSIUM CHLORIDE CRYS ER 20 MEQ PO TBCR
40.0000 meq | EXTENDED_RELEASE_TABLET | Freq: Once | ORAL | Status: AC
  Administered 2018-04-03: 40 meq via ORAL
  Filled 2018-04-03: qty 2

## 2018-04-03 NOTE — Care Management Important Message (Signed)
Important Message  Patient Details  Name: [name] [name] MRN: [id number] Date of Birth: October 31, 1920   Medicare Important Message Given:  Yes    Kerin Salen 04/03/2018, 12:21 [place] Message  Patient Details  Name: [name] [name] MRN: [id number] Date of Birth: Aug 20, 1920   Medicare Important Message Given:  Yes    Kerin Salen 04/03/2018, 12:20 PM

## 2018-04-03 NOTE — Progress Notes (Signed)
Progress Note  Patient Name: [name] [name] Date of Encounter: 04/03/2018  Primary Cardiologist: Dr. Percival Spanish   Subjective   No chest pain or dyspnea  Inpatient Medications    Scheduled Meds: . ALPRAZolam  1 mg Oral QHS  . amiodarone  400 mg Oral BID  . apixaban  2.5 mg Oral BID  . furosemide  80 mg Intravenous BID  . linaclotide  290 mcg Oral QAC breakfast  . mouth rinse  15 mL Mouth Rinse BID  . polyethylene glycol  17 g Oral QHS  . pravastatin  20 mg Oral q1800  . sodium chloride flush  3 mL Intravenous Q12H  . sodium chloride flush  3 mL Intravenous Q12H   Continuous Infusions: . sodium chloride    . sodium chloride     PRN Meds: sodium chloride, acetaminophen, albuterol, ondansetron (ZOFRAN) IV, sodium chloride flush, sodium chloride flush   Vital Signs    Vitals:   04/02/18 1800 04/02/18 2003 04/03/18 0519 04/03/18 0529  BP: 135/75 119/74 123/80   Pulse: 62 68 63   Resp: (!) 21 18 18    Temp:  97.6 F (36.4 C) 97.6 F (36.4 C)   TempSrc:  Oral Oral   SpO2: 97% 100% 97%   Weight:    178 lb 6.4 oz (80.9 kg)  Height:        Intake/Output Summary (Last 24 hours) at 04/03/2018 1017 Last data filed at 04/03/2018 0910 Gross per 24 hour  Intake 120 ml  Output 3550 ml  Net -3430 ml   Filed Weights   04/01/18 0500 04/02/18 0600 04/03/18 0529  Weight: 189 lb 9.5 oz (86 kg) 191 lb 5.8 oz (86.8 kg) 178 lb 6.4 oz (80.9 kg)    Physical Exam   General: WD/WN NAD Head: Normal Neck: supple Lungs:CTA Cardiovascular: RRR Abdomen: Soft, non-tender, non-distended, no masses Extremities: 2+ BLE edema Neuro: Grossly intact Psych: Normal   Labs    Chemistry Recent Labs  Lab 03/30/18 1749  04/01/18 0313 04/02/18 0320 04/03/18 0420  NA 144   < > 139 141 141  K 4.6   < > 5.5* 3.7 3.4*  CL 103   < > 100* 99* 94*  CO2 28   < > 27 29 34*  GLUCOSE 104*   < > 119* 111* 108*  BUN 31*   < > 32* 32* 37*  CREATININE 1.87*   < > 1.90* 1.85* 1.87*  CALCIUM 9.0    < > 8.4* 8.4* 8.6*  PROT 6.7  --   --  6.1*  --   ALBUMIN 3.7  --   --  3.4*  --   AST 30  --   --  15  --   ALT 34  --   --  21  --   ALKPHOS 81  --   --  65  --   BILITOT 2.6*  --   --  3.6*  --   GFRNONAA 29*   < > 28* 29* 29*  GFRAA 33*   < > 33* 34* 33*  ANIONGAP 13   < > 12 13 13    < > = values in this interval not displayed.     Hematology Recent Labs  Lab 03/30/18 1749 04/02/18 0320  WBC 6.2 6.3  RBC 5.22 4.98  HGB 16.7 15.9  HCT 52.2* 49.2  MCV 100.0 98.8  MCH 32.0 31.9  MCHC 32.0 32.3  RDW 15.9* 15.6*  PLT 146* 127*  Cardiac Enzymes Recent Labs  Lab 03/30/18 2244 03/31/18 0646  TROPONINI 0.03* 0.03*    Recent Labs  Lab 03/30/18 1804  TROPIPOC 0.06     BNP Recent Labs  Lab 03/30/18 1750  BNP 758.0*      Telemetry    Sinus with PVCs, couplet and PAT - Personally Reviewed   Cardiac Studies   2D Echo 04/07/18 Study Conclusions  - Left ventricle: The cavity size was normal. There was mild concentric hypertrophy. Systolic function was severely reduced. The estimated ejection fraction was in the range of 25% to 30%. Severe diffuse hypokinesis. Although no diagnostic regional wall motion abnormality was identified, this possibility cannot be completely excluded on the basis of this study. - Aortic valve: There was mild regurgitation. - Mitral valve: There was mild regurgitation directed centrally. - Left atrium: The atrium was severely dilated. - Right atrium: The atrium was severely dilated. - Pulmonary arteries: Systolic pressure was mildly increased. PA peak pressure: 48 mm Hg (S).  Patient Profile     84 y.o. male a hx of cardiomyopathy/ chronic systolic HF with EF of 76-16% (presumed to be tachy mediated), PAF w/ prior cardioversion and treatment w/ amiodarone and Eliquis, h/o CKD and recent outpatient treatment for presumed CAP,who is being seen today for the evaluation ofacute on chronic systolic HF and atrial  fibrillation w/ RVRat the request of Dr. Wendee Beavers, Internal Medicine.  Assessment & Plan    1.  Acute on chronic systolic congestive heart failure: I/O -2780. Weight 178.  Remains volume overloaded.  Renal function appears to be stable.  Continue present dose of Lasix.  CHF hopefully will improve with restoration of sinus rhythm.  Continue fluid restriction and low-sodium diet.  2.  Persistent atrial fibrillation: Patient remains in sinus rhythm status post cardioversion.  Continue amiodarone 400 mg twice daily while in-house and then decreased to 200 mg daily.  Continue apixaban.  3.  Chronic kidney disease stage III: Renal function is stable today.  Continue to follow with diuresis.  4.  Cardiomyopathy: Per echocardiogram LVEF 25 to 30%; we are avoiding beta blockade as he has a history of bradycardia while on combination of beta-blocker and  amiodarone.  No ACE inhibitor or ARB given renal insufficiency.  5.  Hyperkalemia: Supplement.    Signed, Kirk Ruths MD [facility] 04/03/2018, 10:17 AM    For questions or updates, please contact   Please consult [email or web address] for contact info under Cardiology/STEMI.

## 2018-04-03 NOTE — Evaluation (Signed)
Physical Therapy Evaluation Patient Details Name: [name] [name] MRN: [id number] DOB: 1920/01/03 Today's Date: 04/03/2018   History of Present Illness  84 year old male with PMH of cardiomyopathy (tachycardia mediated), chronic systolic CHF (LVEF 25- 56%), PAF with prior cardioversion and on amiodarone and Eliquis, stage III chronic kidney disease (baseline creatinine 1.6-2), right eye blindness, anxiety and depression, HTN, HLD, recently treated for CAP 2 weeks PTA, presented to ED due to progressive lower extremity edema and exertional dyspnea.    Clinical Impression  Pt admitted with above diagnosis. Pt currently with functional limitations due to the deficits listed below (see PT Problem List).  Pt with bowel urgency therefore eval limited; pt may benefit from incr supervision/assist initially at home, pt states his son is retired PT; would  benefit from [facility]  If agreeable; will follow  Pt will benefit from skilled PT to increase their independence and safety with mobility to allow discharge to the venue listed below.       Follow Up Recommendations Home health PT;Supervision for mobility/OOB    Equipment Recommendations  None recommended by PT    Recommendations for Other Services       Precautions / Restrictions Precautions Precautions: Fall      Mobility  Bed Mobility Overal bed mobility: Needs Assistance Bed Mobility: Supine to Sit     Supine to sit: Min assist     General bed mobility comments: assist with trunk to upright  Transfers Overall transfer level: Needs assistance Equipment used: Rolling walker (2 wheeled) Transfers: Sit to/from [facility] Sit to Stand: Min assist Stand pivot transfers: Min assist       General transfer comment: cues for hand placement and RW safety  Ambulation/Gait Ambulation/Gait assistance: Min assist Ambulation Distance (Feet): 4 Feet Assistive device: Rolling walker (2 wheeled) Gait Pattern/deviations:  Step-through pattern;Decreased stride length     General Gait Details: steps away from bed and back to [facility], min assist for balance  Stairs            Wheelchair Mobility    Modified Rankin (Stroke Patients Only)       Balance Overall balance assessment: Needs assistance         Standing balance support: Bilateral upper extremity supported;During functional activity Standing balance-Leahy Scale: Poor Standing balance comment: reliant on UEs                             Pertinent Vitals/Pain Pain Assessment: No/denies pain    Home Living Family/patient expects to be discharged to:: Private residence Living Arrangements: Alone Available Help at Discharge: Family;Friend(s) Type of Home: House Home Access: Stairs to enter Entrance Stairs-Rails: Psychiatric nurse of Steps: 3 Home Layout: One level Home Equipment: Cane - single point;Walker - 2 wheels;Bedside commode      Prior Function Level of Independence: Independent with assistive device(s)         Comments: amb with RW     Hand Dominance        Extremity/Trunk Assessment   Upper Extremity Assessment Upper Extremity Assessment: Generalized weakness    Lower Extremity Assessment Lower Extremity Assessment: Generalized weakness       Communication   Communication: No difficulties(blind R eye)  Cognition Arousal/Alertness: Awake/alert Behavior During Therapy: WFL for tasks assessed/performed Overall Cognitive Status: Within Functional Limits for tasks assessed  General Comments      Exercises     Assessment/Plan    PT Assessment Patient needs continued PT services  PT Problem List Decreased strength;Decreased activity tolerance;Decreased balance;Decreased mobility       PT Treatment Interventions DME instruction;Gait training;Therapeutic activities;Functional mobility training;Therapeutic  exercise;Patient/family education    PT Goals (Current goals can be found in the Care Plan section)  Acute Rehab PT Goals Patient Stated Goal: home, get better PT Goal Formulation: With patient Time For Goal Achievement: 04/10/18 Potential to Achieve Goals: Good    Frequency Min 3X/week   Barriers to discharge        Co-evaluation               AM-PAC PT "6 Clicks" Daily Activity  Outcome Measure Difficulty turning over in bed (including adjusting bedclothes, sheets and blankets)?: Unable Difficulty moving from lying on back to sitting on the side of the bed? : Unable Difficulty sitting down on and standing up from a chair with arms (e.g., wheelchair, bedside commode, etc,.)?: A Little Help needed moving to and from a bed to chair (including a wheelchair)?: A Little Help needed walking in hospital room?: A Little Help needed climbing 3-5 steps with a railing? : A Lot 6 Click Score: 13    End of Session Equipment Utilized During Treatment: Gait belt Activity Tolerance: Patient tolerated treatment well Patient left: Other (comment)(BSC, NT notified)   PT Visit Diagnosis: Unsteadiness on feet (R26.81)    Time: 1610-9604 PT Time Calculation (min) (ACUTE ONLY): 20 min   Charges:   PT Evaluation $PT Eval Low Complexity: 1 Low     PT G CodesKenyon Ana, PT Pager: [phone] 04/03/2018   [facility] 04/03/2018, 3:59 PM

## 2018-04-03 NOTE — Progress Notes (Addendum)
Pt declined going to SNF. Pt selected [facility]HR[facility]/PT/OT/SW. Pt also states that he have [facility] [employer].  A call was made to [facility] [phone], the services  Provided are not in home services. Pt aware will proceed with [facility].

## 2018-04-03 NOTE — Progress Notes (Signed)
PROGRESS NOTE   [name] [name]  YIF:[id number]    DOB: [birth year]    DOA: 03/30/2018  PCP: Cassandria Anger, MD   I have briefly reviewed patients previous medical records in [facility].  Brief Narrative:  84 year old male with PMH of cardiomyopathy (tachycardia mediated), chronic systolic CHF (LVEF 25- 86%), PAF with prior cardioversion and on amiodarone and Eliquis, stage III chronic kidney disease (baseline creatinine 1.6-2), right eye blindness, anxiety and depression, HTN, HLD, recently treated for CAP 2 weeks PTA, presented to ED due to progressive lower extremity edema and exertional dyspnea.  Admitted for acute on chronic systolic CHF.  Cardiology was consulted and underwent successful TEE cardioversion 04/01/2018 at [facility].  Improving and stable.  Transferred to telemetry 5/16.  Possible discharge in 24-48 hours.  PT evaluation pending.   Assessment & Plan:   Active Problems:   Essential hypertension   CKD (chronic kidney disease) stage 3, GFR 30-59 ml/min (HCC)   Systolic CHF, chronic (HCC)   Atrial fibrillation with RVR (HCC)   Anxiety   Acute on chronic systolic CHF/cardiomyopathy: TTE 03/31/2018: LVEF 20-25% with diffuse hypokinesis, worse than prior.  Cardiology consulted and felt that the cardiomyopathy was related to tachycardia.  Treating with IV Lasix, dose increased and currently 80 mg every 12 hourly.  -5.3 L since admission.  Weight down from 192 pounds on admission to 178 pounds?  Accuracy.  Remains volume overloaded but slowly improving.  No ACEI/ARB due to renal insufficiency and hyperkalemia but may consider adding later if BP and renal function allow.  Continue current dose of Lasix.  Cardiology following.  Persistent atrial fibrillation: Cardiology was consulted.  Amiodarone increased to 200 mg daily.  IV Cardizem drip discontinued 5/14 around 10 PM in anticipation of DCCV.  Continue low-dose Eliquis 2.5 mg twice daily.  TSH normal. He is status post TEE and  successful cardioversion 5/15.  Remains in sinus rhythm post cardioversion.  As per cardiology follow-up, amiodarone increased to 400 mg twice daily while in the hospital and then resume 200 mg daily at discharge.  Of note patient had significant bradycardia with addition of beta-blocker in the past and therefore avoiding same.  Remains in sinus rhythm/stable.  Stage III chronic kidney disease: Recent baseline creatinine appears to be 1.6 range.  Creatinine has decreased from 1.9 >1 0.85 > 1.87.  Likely related to diuresis.  Follow BMP closely.  Stable.  Hyperkalemia/hypokalemia: Not on potassium supplements or ACEI/ARB.  Change diet to low potassium diet.  Continue IV Lasix.  Resolved.  Now hypokalemic, replace and follow BMP in a.m.  Essential hypertension: Controlled.  Hyperlipidemia: Continue pravastatin.  Anxiety: Continue bedtime scheduled Xanax.  Isolated hyperbilirubinemia: Unclear etiology.?  Passive hepatic congestion.  Total bilirubin has increased from 2.6-3.6 and predominantly indirect hyperbilirubinemia.  NSVT/NSSVT: Continue amiodarone.  Monitor on telemetry.  Magnesium 2.  Thrombocytopenia: Appears chronic.  Stable.    DVT prophylaxis: Eliquis Code Status: Full Family Communication: None at bedside Disposition: Admitted to stepdown.  Transferred to telemetry on 5/16.  DC likely in 24-48 hours, site pending PT evaluation.  Consultants:  Cardiology  Procedures:  TEE and cardioversion 04/01/2018:  Brief TEE Note  LVEF 20% Diffuse hypokinesis No LA/LAA thrombus or mass Mild-moderate mitral regurgitation with multiple jets Mild TR, PR and AR Small posterior pericardial effusion L pleural effusion noted Moderate atherosclerosis of the descending aorta Severe enlargement of the LA.    Antimicrobials:  None   Subjective: "Breathing is fine".  Denies dyspnea or  chest pain or palpitations.  Swelling of body decreasing.  As per RN, no acute issues  reported.  ROS: As above.  Objective:  Vitals:   04/02/18 1800 04/02/18 2003 04/03/18 0519 04/03/18 0529  BP: 135/75 119/74 123/80   Pulse: 62 68 63   Resp: (!) 21 18 18    Temp:  97.6 F (36.4 C) 97.6 F (36.4 C)   TempSrc:  Oral Oral   SpO2: 97% 100% 97%   Weight:    80.9 kg (178 lb 6.4 oz)  Height:        Examination:  General exam: Pleasant elderly male, moderately built and overweight lying comfortably supine in bed. Respiratory system: Clear to auscultation.  No increased work of breathing.  Stable Cardiovascular system: S1 and S2 heard, RRR.  No JVD or murmurs.  1+ pitting bilateral leg edema, improving very slowly..  Telemetry personally reviewed: Remains in sinus rhythm.  18 beat nonsustained SVT noted on 5/17 at 12:22 AM. Gastrointestinal system: Abdomen is nondistended, soft and nontender. No organomegaly or masses felt. Normal bowel sounds heard.  Stable Central nervous system: Alert and oriented. No focal neurological deficits.  Stable Extremities: Symmetric 5 x 5 power.  Stable Skin: No rashes, lesions or ulcers Psychiatry: Judgement and insight appear normal. Mood & affect appropriate.  Eyes: Corneal opacity right eye with blindness.    Data Reviewed: I have personally reviewed following labs and imaging studies  CBC: Recent Labs  Lab 03/30/18 1749 04/02/18 0320  WBC 6.2 6.3  NEUTROABS 4.6  --   HGB 16.7 15.9  HCT 52.2* 49.2  MCV 100.0 98.8  PLT 146* 245*   Basic Metabolic Panel: Recent Labs  Lab 03/30/18 1749 03/30/18 2244 03/31/18 0646 04/01/18 0313 04/02/18 0320 04/03/18 0420  NA 144  --  144 139 141 141  K 4.6  --  4.1 5.5* 3.7 3.4*  CL 103  --  103 100* 99* 94*  CO2 28  --  28 27 29  34*  GLUCOSE 104*  --  108* 119* 111* 108*  BUN 31*  --  30* 32* 32* 37*  CREATININE 1.87*  --  1.69* 1.90* 1.85* 1.87*  CALCIUM 9.0  --  8.8* 8.4* 8.4* 8.6*  MG  --  2.2 2.0  --   --   --    Liver Function Tests: Recent Labs  Lab 03/30/18 1749  04/02/18 0320  AST 30 15  ALT 34 21  ALKPHOS 81 65  BILITOT 2.6* 3.6*  PROT 6.7 6.1*  ALBUMIN 3.7 3.4*   Coagulation Profile: Recent Labs  Lab 03/30/18 2244  INR 1.32   Cardiac Enzymes: Recent Labs  Lab 03/30/18 2244 03/31/18 0646  TROPONINI 0.03* 0.03*   HbA1C: No results for input(s): HGBA1C in the last 72 hours. CBG: No results for input(s): GLUCAP in the last 168 hours.  Recent Results (from the past 240 hour(s))  Blood culture (routine x 2)     Status: None (Preliminary result)   Collection Time: 03/30/18  5:50 PM  Result Value Ref Range Status   Specimen Description   Final    BLOOD LEFT ANTECUBITAL Performed at [facility] [address], [place], [place] [address]    Special Requests   Final    BOTTLES DRAWN AEROBIC AND ANAEROBIC Blood Culture adequate volume Performed at [facility] [address] [place],  [address]    Culture   Final    NO GROWTH 3 DAYS Performed at  [facility], [place] [address], [place], [place] [address]    Report Status PENDING  Incomplete  Urine culture     Status: Abnormal   Collection Time: 03/30/18  5:50 PM  Result Value Ref Range Status   Specimen Description   Final    URINE, RANDOM Performed at [facility] [address] [place], [place] [address]    Special Requests   Final    NONE Performed at [facility], [place] [address], [place], [place] [address]    Culture 40,000 COLONIES/mL ESCHERICHIA COLI (A)  Final   Report Status 04/02/2018 FINAL  Final   Organism ID, Bacteria ESCHERICHIA COLI (A)  Final      Susceptibility   Escherichia coli - MIC*    AMPICILLIN 8 SENSITIVE Sensitive     CEFAZOLIN <=4 SENSITIVE Sensitive     CEFTRIAXONE <=1 SENSITIVE Sensitive     CIPROFLOXACIN <=0.25 SENSITIVE Sensitive     GENTAMICIN <=1 SENSITIVE Sensitive     IMIPENEM <=0.25 SENSITIVE Sensitive     NITROFURANTOIN <=16 SENSITIVE Sensitive      TRIMETH/SULFA <=20 SENSITIVE Sensitive     AMPICILLIN/SULBACTAM 4 SENSITIVE Sensitive     PIP/TAZO <=4 SENSITIVE Sensitive     Extended ESBL NEGATIVE Sensitive     * 40,000 COLONIES/mL ESCHERICHIA COLI  Blood culture (routine x 2)     Status: None (Preliminary result)   Collection Time: 03/30/18  5:58 PM  Result Value Ref Range Status   Specimen Description   Final    BLOOD RIGHT FOREARM Performed at [facility] [address] [place], [place] [address]    Special Requests   Final    BOTTLES DRAWN AEROBIC AND ANAEROBIC Blood Culture adequate volume Performed at [facility] [address], [place], [place] [address]    Culture   Final    NO GROWTH 3 DAYS Performed at [facility], [place] [address], [place], [place] [address]    Report Status PENDING  Incomplete  MRSA PCR Screening     Status: None   Collection Time: 03/30/18 10:24 PM  Result Value Ref Range Status   MRSA by PCR NEGATIVE NEGATIVE Final    Comment:        The GeneXpert MRSA Assay (FDA approved for NASAL specimens only), is one component of a comprehensive MRSA colonization surveillance program. It is not intended to diagnose MRSA infection nor to guide or monitor treatment for MRSA infections. Performed at [facility], [place] [address] [place], [place] [address]          Radiology Studies: No results found.      Scheduled Meds: . ALPRAZolam  1 mg Oral QHS  . amiodarone  400 mg Oral BID  . apixaban  2.5 mg Oral BID  . furosemide  80 mg Intravenous BID  . linaclotide  290 mcg Oral QAC breakfast  . mouth rinse  15 mL Mouth Rinse BID  . polyethylene glycol  17 g Oral QHS  . pravastatin  20 mg Oral q1800  . sodium chloride flush  3 mL Intravenous Q12H  . sodium chloride flush  3 mL Intravenous Q12H   Continuous Infusions: . sodium chloride    . sodium chloride       LOS: 4 days     Vernell Leep, MD, FACP,  [facility]. [facility] Pager [phone]  If 7PM-7AM, please contact night-coverage [email or web address] Password [facility] 04/03/2018, 10:29  AM

## 2018-04-04 LAB — HEPATIC FUNCTION PANEL
ALBUMIN: 3.4 g/dL — AB (ref 3.5–5.0)
ALT: 14 U/L — ABNORMAL LOW (ref 17–63)
AST: 14 U/L — ABNORMAL LOW (ref 15–41)
Alkaline Phosphatase: 67 U/L (ref 38–126)
BILIRUBIN TOTAL: 3.5 mg/dL — AB (ref 0.3–1.2)
Bilirubin, Direct: 0.4 mg/dL (ref 0.1–0.5)
Indirect Bilirubin: 3.1 mg/dL — ABNORMAL HIGH (ref 0.3–0.9)
TOTAL PROTEIN: 6.5 g/dL (ref 6.5–8.1)

## 2018-04-04 LAB — CULTURE, BLOOD (ROUTINE X 2)
CULTURE: NO GROWTH
Culture: NO GROWTH
Special Requests: ADEQUATE
Special Requests: ADEQUATE

## 2018-04-04 LAB — BASIC METABOLIC PANEL
Anion gap: 15 (ref 5–15)
BUN: 41 mg/dL — ABNORMAL HIGH (ref 6–20)
CALCIUM: 8.7 mg/dL — AB (ref 8.9–10.3)
CO2: 35 mmol/L — ABNORMAL HIGH (ref 22–32)
CREATININE: 1.92 mg/dL — AB (ref 0.61–1.24)
Chloride: 91 mmol/L — ABNORMAL LOW (ref 101–111)
GFR, EST AFRICAN AMERICAN: 32 mL/min — AB (ref >60)
GFR, EST NON AFRICAN AMERICAN: 28 mL/min — AB (ref >60)
Glucose, Bld: 108 mg/dL — ABNORMAL HIGH (ref 65–99)
Potassium: 3.7 mmol/L (ref 3.5–5.1)
SODIUM: 141 mmol/L (ref 135–145)

## 2018-04-04 MED ORDER — AMIODARONE HCL 200 MG PO TABS: 200.0000 mg | ORAL_TABLET | Freq: Every day | ORAL | 0 refills | Status: DC

## 2018-04-04 MED ORDER — FUROSEMIDE 40 MG PO TABS: 40.0000 mg | ORAL_TABLET | Freq: Two times a day (BID) | ORAL | 0 refills | Status: DC

## 2018-04-04 MED ORDER — AMIODARONE HCL 200 MG PO TABS
200.0000 mg | ORAL_TABLET | Freq: Every day | ORAL | Status: DC
  Administered 2018-04-04: 200 mg via ORAL
  Filled 2018-04-04: qty 1

## 2018-04-04 MED ORDER — FUROSEMIDE 40 MG PO TABS
40.0000 mg | ORAL_TABLET | Freq: Two times a day (BID) | ORAL | Status: DC
  Administered 2018-04-04: 40 mg via ORAL
  Filled 2018-04-04: qty 1

## 2018-04-04 MED ORDER — POTASSIUM CHLORIDE CRYS ER 20 MEQ PO TBCR
40.0000 meq | EXTENDED_RELEASE_TABLET | Freq: Once | ORAL | Status: AC
  Administered 2018-04-04: 40 meq via ORAL
  Filled 2018-04-04: qty 2

## 2018-04-04 NOTE — Care Management Note (Signed)
Case Management Note  Patient Details  Name: [name] [name] MRN: [id number] Date of Birth: September 15, 1920  Subjective/Objective:      CHF, persistent Afib              Action/Plan: Please see previous NCM notes. Contacted [facility] to make aware of dc home today with HH.   Expected Discharge Date:  04/04/18               Expected Discharge Plan:  [place]  In-House Referral:  NA  Discharge planning Services  CM Consult  Post Acute Care Choice:  Home Health Choice offered to:  Patient  DME Arranged:  N/A DME Agency:  NA  HH Arranged:  RN, PT, OT, Social Work [employer] Agency:  [place]  Status of Service:  Completed, signed off  If discussed at [employer], dates discussed:    Additional Comments:  Erenest Rasher, RN 04/04/2018, 2:28 PM

## 2018-04-04 NOTE — Discharge Instructions (Signed)
Please get your medications reviewed and adjusted by your Primary MD. ° °Please request your Primary MD to go over all Hospital Tests and Procedure/Radiological results at the follow up, please get all Hospital records sent to your Prim MD by signing hospital release before you go home. ° °If you had Pneumonia of Lung problems at the Hospital: °Please get a 2 view Chest X ray done in 6-8 weeks after hospital discharge or sooner if instructed by your Primary MD. ° °If you have Congestive Heart Failure: °Please call your Cardiologist or Primary MD anytime you have any of the following symptoms:  °1) 3 pound weight gain in 24 hours or 5 pounds in 1 week  °2) shortness of breath, with or without a dry hacking cough  °3) swelling in the hands, feet or stomach  °4) if you have to sleep on extra pillows at night in order to breathe ° °Follow cardiac low salt diet and 1.5 lit/day fluid restriction. ° °If you have diabetes °Accuchecks 4 times/day, Once in AM empty stomach and then before each meal. °Log in all results and show them to your primary doctor at your next visit. °If any glucose reading is under 80 or above 300 call your primary MD immediately. ° °If you have Seizure/Convulsions/Epilepsy: °Please do not drive, operate heavy machinery, participate in activities at heights or participate in high speed sports until you have seen by Primary MD or a Neurologist and advised to do so again. ° °If you had Gastrointestinal Bleeding: °Please ask your Primary MD to check a complete blood count within one week of discharge or at your next visit. Your endoscopic/colonoscopic biopsies that are pending at the time of discharge, will also need to followed by your Primary MD. ° °Get Medicines reviewed and adjusted. °Please take all your medications with you for your next visit with your Primary MD ° °Please request your Primary MD to go over all hospital tests and procedure/radiological results at the follow up, please ask your  Primary MD to get all Hospital records sent to his/her office. ° °If you experience worsening of your admission symptoms, develop shortness of breath, life threatening emergency, suicidal or homicidal thoughts you must seek medical attention immediately by calling 911 or calling your MD immediately  if symptoms less severe. ° °You must read complete instructions/literature along with all the possible adverse reactions/side effects for all the Medicines you take and that have been prescribed to you. Take any new Medicines after you have completely understood and accpet all the possible adverse reactions/side effects.  ° °Do not drive or operate heavy machinery when taking Pain medications.  ° °Do not take more than prescribed Pain, Sleep and Anxiety Medications ° °Special Instructions: If you have smoked or chewed Tobacco  in the last 2 yrs please stop smoking, stop any regular Alcohol  and or any Recreational drug use. ° °Wear Seat belts while driving. ° °Please note °You were cared for by a hospitalist during your hospital stay. If you have any questions about your discharge medications or the care you received while you were in the hospital after you are discharged, you can call the unit and asked to speak with the hospitalist on call if the hospitalist that took care of you is not available. Once you are discharged, your primary care physician will handle any further medical issues. Please note that NO REFILLS for any discharge medications will be authorized once you are discharged, as it is imperative that you   return to your primary care physician (or establish a relationship with a primary care physician if you do not have one) for your aftercare needs so that they can reassess your need for medications and monitor your lab values.  You can reach the hospitalist office at phone [phone] or fax [phone]   If you do not have a primary care physician, you can call [phone] for a physician  referral.   Heart Failure Heart failure is a condition in which the heart has trouble pumping blood because it has become weak or stiff. This means that the heart does not pump blood efficiently for the body to work well. For some people with heart failure, fluid may back up into the lungs and there may be swelling (edema) in the lower legs. Heart failure is usually a long-term (chronic) condition. It is important for you to take good care of yourself and follow the treatment plan from your health care provider. What are the causes? This condition is caused by some health problems, including:  High blood pressure (hypertension). Hypertension causes the heart muscle to work harder than normal. High blood pressure eventually causes the heart to become stiff and weak.  Coronary artery disease (CAD). CAD is the buildup of cholesterol and fat (plaques) in the arteries of the heart.  Heart attack (myocardial infarction). Injured tissue, which is caused by the heart attack, does not contract as well and the heart's ability to pump blood is weakened.  Abnormal heart valves. When the heart valves do not open and close properly, the heart muscle must pump harder to keep the blood flowing.  Heart muscle disease (cardiomyopathy or myocarditis). Heart muscle disease is damage to the heart muscle from a variety of causes, such as drug or alcohol abuse, infections, or unknown causes. These can increase the risk of heart failure.  Lung disease. When the lungs do not work properly, the heart must work harder.  What increases the risk? Risk of heart failure increases as a person ages. This condition is also more likely to develop in people who:  Are overweight.  Are male.  Smoke or chew tobacco.  Abuse alcohol or illegal drugs.  Have taken medicines that can damage the heart, such as chemotherapy drugs.  Have diabetes. ? High blood sugar (glucose) is associated with high fat (lipid) levels in the  blood. ? Diabetes can also damage tiny blood vessels that carry nutrients to the heart muscle.  Have abnormal heart rhythms.  Have thyroid problems.  Have low blood counts (anemia).  What are the signs or symptoms? Symptoms of this condition include:  Shortness of breath with activity, such as when climbing stairs.  Persistent cough.  Swelling of the feet, ankles, legs, or abdomen.  Unexplained weight gain.  Difficulty breathing when lying flat (orthopnea).  Waking from sleep because of the need to sit up and get more air.  Rapid heartbeat.  Fatigue and loss of energy.  Feeling light-headed, dizzy, or close to fainting.  Loss of appetite.  Nausea.  Increased urination during the night (nocturia).  Confusion.  How is this diagnosed? This condition is diagnosed based on:  Medical history, symptoms, and a physical exam.  Diagnostic tests, which may include: ? Echocardiogram. ? Electrocardiogram (ECG). ? Chest X-ray. ? Blood tests. ? Exercise stress test. ? Radionuclide scans. ? Cardiac catheterization and angiogram.  How is this treated? Treatment for this condition is aimed at managing the symptoms of heart failure. Medicines, behavioral changes, or other treatments  may be necessary to treat heart failure. Medicines These may include:  Angiotensin-converting enzyme (ACE) inhibitors. This type of medicine blocks the effects of a blood protein called angiotensin-converting enzyme. ACE inhibitors relax (dilate) the blood vessels and help to lower blood pressure.  Angiotensin receptor blockers (ARBs). This type of medicine blocks the actions of a blood protein called angiotensin. ARBs dilate the blood vessels and help to lower blood pressure.  Water pills (diuretics). Diuretics cause the kidneys to remove salt and water from the blood. The extra fluid is removed through urination, leaving a lower volume of blood that the heart has to pump.  Beta blockers.  These improve heart muscle strength and they prevent the heart from beating too quickly.  Digoxin. This increases the force of the heartbeat.  Healthy behavior changes These may include:  Reaching and maintaining a healthy weight.  Stopping smoking or chewing tobacco.  Eating heart-healthy foods.  Limiting or avoiding alcohol.  Stopping use of street drugs (illegal drugs).  Physical activity.  Other treatments These may include:  Surgery to open blocked coronary arteries or repair damaged heart valves.  Placement of a biventricular pacemaker to improve heart muscle function (cardiac resynchronization therapy). This device paces both the right ventricle and left ventricle.  Placement of a device to treat serious abnormal heart rhythms (implantable cardioverter defibrillator, or ICD).  Placement of a device to improve the pumping ability of the heart (left ventricular assist device, or LVAD).  Heart transplant. This can cure heart failure, and it is considered for certain patients who do not improve with other therapies.  Follow these instructions at home: Medicines  Take over-the-counter and prescription medicines only as told by your health care provider. Medicines are important in reducing the workload of your heart, slowing the progression of heart failure, and improving your symptoms. ? Do not stop taking your medicine unless your health care provider told you to do that. ? Do not skip any dose of medicine. ? Refill your prescriptions before you run out of medicine. You need your medicines every day. Eating and drinking   Eat heart-healthy foods. Talk with a dietitian to make an eating plan that is right for you. ? Choose foods that contain no trans fat and are low in saturated fat and cholesterol. Healthy choices include fresh or frozen fruits and vegetables, fish, lean meats, legumes, fat-free or low-fat dairy products, and whole-grain or high-fiber foods. ? Limit  salt (sodium) if directed by your health care provider. Sodium restriction may reduce symptoms of heart failure. Ask a dietitian to recommend heart-healthy seasonings. ? Use healthy cooking methods instead of frying. Healthy methods include roasting, grilling, broiling, baking, poaching, steaming, and stir-frying.  Limit your fluid intake if directed by your health care provider. Fluid restriction may reduce symptoms of heart failure. Lifestyle  Stop smoking or using chewing tobacco. Nicotine and tobacco can damage your heart and your blood vessels. Do not use nicotine gum or patches before talking to your health care provider.  Limit alcohol intake to no more than 1 drink per day for non-pregnant women and 2 drinks per day for men. One drink equals 12 oz of beer, 5 oz of wine, or 1 oz of hard liquor. ? Drinking more than that is harmful to your heart. Tell your health care provider if you drink alcohol several times a week. ? Talk with your health care provider about whether any level of alcohol use is safe for you. ? If your heart has  already been damaged by alcohol or you have severe heart failure, drinking alcohol should be stopped completely.  Stop use of illegal drugs.  Lose weight if directed by your health care provider. Weight loss may reduce symptoms of heart failure.  Do moderate physical activity if directed by your health care provider. People who are elderly and people with severe heart failure should consult with a health care provider for physical activity recommendations. Monitor important information  Weigh yourself every day. Keeping track of your weight daily helps you to notice excess fluid sooner. ? Weigh yourself every morning after you urinate and before you eat breakfast. ? Wear the same amount of clothing each time you weigh yourself. ? Record your daily weight. Provide your health care provider with your weight record.  Monitor and record your blood pressure as  told by your health care provider.  Check your pulse as told by your health care provider. Dealing with extreme temperatures  If the weather is extremely hot: ? Avoid vigorous physical activity. ? Use air conditioning or fans or seek a cooler location. ? Avoid caffeine and alcohol. ? Wear loose-fitting, lightweight, and light-colored clothing.  If the weather is extremely cold: ? Avoid vigorous physical activity. ? Layer your clothes. ? Wear mittens or gloves, a hat, and a scarf when you go outside. ? Avoid alcohol. General instructions  Manage other health conditions such as hypertension, diabetes, thyroid disease, or abnormal heart rhythms as told by your health care provider.  Learn to manage stress. If you need help to do this, ask your health care provider.  Plan rest periods when fatigued.  Get ongoing education and support as needed.  Participate in or seek rehabilitation as needed to maintain or improve independence and quality of life.  Stay up to date with immunizations. Keeping current on pneumococcal and influenza immunizations is especially important to prevent respiratory infections.  Keep all follow-up visits as told by your health care provider. This is important. Contact a health care provider if:  You have a rapid weight gain.  You have increasing shortness of breath that is unusual for you.  You are unable to participate in your usual physical activities.  You tire easily.  You cough more than normal, especially with physical activity.  You have any swelling or more swelling in areas such as your hands, feet, ankles, or abdomen.  You are unable to sleep because it is hard to breathe.  You feel like your heart is beating quickly (palpitations).  You become dizzy or light-headed when you stand up. Get help right away if:  You have difficulty breathing.  You notice or your family notices a change in your awareness, such as having trouble staying  awake or having difficulty with concentration.  You have pain or discomfort in your chest.  You have an episode of fainting (syncope). This information is not intended to replace advice given to you by your health care provider. Make sure you discuss any questions you have with your health care provider. Document Released: 11/04/2005 Document Revised: 07/09/2016 Document Reviewed: 05/29/2016 Elsevier Interactive Patient Education  2018 [employer]. Atrial Fibrillation Atrial fibrillation is a type of irregular or rapid heartbeat (arrhythmia). In atrial fibrillation, the heart quivers continuously in a chaotic pattern. This occurs when parts of the heart receive disorganized signals that make the heart unable to pump blood normally. This can increase the risk for stroke, heart failure, and other heart-related conditions. There are different types of atrial fibrillation,  including:  Paroxysmal atrial fibrillation. This type starts suddenly, and it usually stops on its own shortly after it starts.  Persistent atrial fibrillation. This type often lasts longer than a week. It may stop on its own or with treatment.  Long-lasting persistent atrial fibrillation. This type lasts longer than 12 months.  Permanent atrial fibrillation. This type does not go away.  Talk with your health care provider to learn about the type of atrial fibrillation that you have. What are the causes? This condition is caused by some heart-related conditions or procedures, including:  A heart attack.  Coronary artery disease.  Heart failure.  Heart valve conditions.  High blood pressure.  Inflammation of the sac that surrounds the heart (pericarditis).  Heart surgery.  Certain heart rhythm disorders, such as Wolf-Parkinson-White syndrome.  Other causes include:  Pneumonia.  Obstructive sleep apnea.  Blockage of an artery in the lungs (pulmonary embolism, or PE).  Lung cancer.  Chronic lung  disease.  Thyroid problems, especially if the thyroid is overactive (hyperthyroidism).  Caffeine.  Excessive alcohol use or illegal drug use.  Use of some medicines, including certain decongestants and diet pills.  Sometimes, the cause cannot be found. What increases the risk? This condition is more likely to develop in:  People who are older in age.  People who smoke.  People who have diabetes mellitus.  People who are overweight (obese).  Athletes who exercise vigorously.  What are the signs or symptoms? Symptoms of this condition include:  A feeling that your heart is beating rapidly or irregularly.  A feeling of discomfort or pain in your chest.  Shortness of breath.  Sudden light-headedness or weakness.  Getting tired easily during exercise.  In some cases, there are no symptoms. How is this diagnosed? Your health care provider may be able to detect atrial fibrillation when taking your pulse. If detected, this condition may be diagnosed with:  An electrocardiogram (ECG).  A Holter monitor test that records your heartbeat patterns over a 24-hour period.  Transthoracic echocardiogram (TTE) to evaluate how blood flows through your heart.  Transesophageal echocardiogram (TEE) to view more detailed images of your heart.  A stress test.  Imaging tests, such as a CT scan or chest X-ray.  Blood tests.  How is this treated? The main goals of treatment are to prevent blood clots from forming and to keep your heart beating at a normal rate and rhythm. The type of treatment that you receive depends on many factors, such as your underlying medical conditions and how you feel when you are experiencing atrial fibrillation. This condition may be treated with:  Medicine to slow down the heart rate, bring the hearts rhythm back to normal, or prevent clots from forming.  Electrical cardioversion. This is a procedure that resets your hearts rhythm by delivering a  controlled, low-energy shock to the heart through your skin.  Different types of ablation, such as catheter ablation, catheter ablation with pacemaker, or surgical ablation. These procedures destroy the heart tissues that send abnormal signals. When the pacemaker is used, it is placed under your skin to help your heart beat in a regular rhythm.  Follow these instructions at home:  Take over-the counter and prescription medicines only as told by your health care provider.  If your health care provider prescribed a blood-thinning medicine (anticoagulant), take it exactly as told. Taking too much blood-thinning medicine can cause bleeding. If you do not take enough blood-thinning medicine, you will not have the protection  that you need against stroke and other problems.  Do not use tobacco products, including cigarettes, chewing tobacco, and e-cigarettes. If you need help quitting, ask your health care provider.  If you have obstructive sleep apnea, manage your condition as told by your health care provider.  Do not drink alcohol.  Do not drink beverages that contain caffeine, such as coffee, soda, and tea.  Maintain a healthy weight. Do not use diet pills unless your health care provider approves. Diet pills may make heart problems worse.  Follow diet instructions as told by your health care provider.  Exercise regularly as told by your health care provider.  Keep all follow-up visits as told by your health care provider. This is important. How is this prevented?  Avoid drinking beverages that contain caffeine or alcohol.  Avoid certain medicines, especially medicines that are used for breathing problems.  Avoid certain herbs and herbal medicines, such as those that contain ephedra or ginseng.  Do not use illegal drugs, such as cocaine and amphetamines.  Do not smoke.  Manage your high blood pressure. Contact a health care provider if:  You notice a change in the rate, rhythm,  or strength of your heartbeat.  You are taking an anticoagulant and you notice increased bruising.  You tire more easily when you exercise or exert yourself. Get help right away if:  You have chest pain, abdominal pain, sweating, or weakness.  You feel nauseous.  You notice blood in your vomit, bowel movement, or urine.  You have shortness of breath.  You suddenly have swollen feet and ankles.  You feel dizzy.  You have sudden weakness or numbness of the face, arm, or leg, especially on one side of the body.  You have trouble speaking, trouble understanding, or both (aphasia).  Your face or your eyelid droops on one side. These symptoms may represent a serious problem that is an emergency. Do not wait to see if the symptoms will go away. Get medical help right away. Call your local emergency services (911 in the [place] Do not drive yourself to the hospital. This information is not intended to replace advice given to you by your health care provider. Make sure you discuss any questions you have with your health care provider. Document Released: 11/04/2005 Document Revised: 03/13/2016 Document Reviewed: 03/01/2015 Elsevier Interactive Patient Education  [employer].

## 2018-04-04 NOTE — Progress Notes (Signed)
Progress Note  Patient Name: [name] [name] Date of Encounter: 04/04/2018  Primary Cardiologist: Dr. Percival Spanish   Subjective   Much improved; no chest pain or dyspnea  Inpatient Medications    Scheduled Meds: . ALPRAZolam  1 mg Oral QHS  . amiodarone  400 mg Oral BID  . apixaban  2.5 mg Oral BID  . furosemide  80 mg Intravenous BID  . linaclotide  290 mcg Oral QAC breakfast  . mouth rinse  15 mL Mouth Rinse BID  . polyethylene glycol  17 g Oral QHS  . pravastatin  20 mg Oral q1800  . sodium chloride flush  3 mL Intravenous Q12H  . sodium chloride flush  3 mL Intravenous Q12H   Continuous Infusions: . sodium chloride    . sodium chloride     PRN Meds: sodium chloride, acetaminophen, albuterol, ondansetron (ZOFRAN) IV, sodium chloride flush, sodium chloride flush   Vital Signs    Vitals:   04/03/18 1415 04/03/18 1837 04/03/18 2015 04/04/18 0452  BP: 114/78  115/82 110/78  Pulse: 72  79 70  Resp: 18  18 18   Temp:  97.6 F (36.4 C) (!) 97 F (36.1 C) 97.6 F (36.4 C)  TempSrc:  Oral  Oral  SpO2: 94%  94% 90%  Weight:    173 lb 4.5 oz (78.6 kg)  Height:        Intake/Output Summary (Last 24 hours) at 04/04/2018 0641 Last data filed at 04/03/2018 2031 Gross per 24 hour  Intake 123 ml  Output 1825 ml  Net -1702 ml   Filed Weights   04/02/18 0600 04/03/18 0529 04/04/18 0452  Weight: 191 lb 5.8 oz (86.8 kg) 178 lb 6.4 oz (80.9 kg) 173 lb 4.5 oz (78.6 kg)    Physical Exam   General: in bed; NAD Head: Normal, normal eyelids Neck: supple; no JVD Lungs:CTA, no wheeze Cardiovascular: RRR, no murmur Abdomen: Soft, NT/ND Extremities: trace to 1+ edema Neuro:No focal findings   Labs    Chemistry Recent Labs  Lab 03/30/18 1749  04/02/18 0320 04/03/18 0420 04/04/18 0358  NA 144   < > 141 141 141  K 4.6   < > 3.7 3.4* 3.7  CL 103   < > 99* 94* 91*  CO2 28   < > 29 34* 35*  GLUCOSE 104*   < > 111* 108* 108*  BUN 31*   < > 32* 37* 41*  CREATININE  1.87*   < > 1.85* 1.87* 1.92*  CALCIUM 9.0   < > 8.4* 8.6* 8.7*  PROT 6.7  --  6.1*  --  6.5  ALBUMIN 3.7  --  3.4*  --  3.4*  AST 30  --  15  --  14*  ALT 34  --  21  --  14*  ALKPHOS 81  --  65  --  67  BILITOT 2.6*  --  3.6*  --  3.5*  GFRNONAA 29*   < > 29* 29* 28*  GFRAA 33*   < > 34* 33* 32*  ANIONGAP 13   < > 13 13 15    < > = values in this interval not displayed.     Hematology Recent Labs  Lab 03/30/18 1749 04/02/18 0320  WBC 6.2 6.3  RBC 5.22 4.98  HGB 16.7 15.9  HCT 52.2* 49.2  MCV 100.0 98.8  MCH 32.0 31.9  MCHC 32.0 32.3  RDW 15.9* 15.6*  PLT 146* 127*  Cardiac Enzymes Recent Labs  Lab 03/30/18 2244 03/31/18 0646  TROPONINI 0.03* 0.03*    Recent Labs  Lab 03/30/18 1804  TROPIPOC 0.06     BNP Recent Labs  Lab 03/30/18 1750  BNP 758.0*      Telemetry    Sinus with PAT - Personally Reviewed   Cardiac Studies   2D Echo 04/07/18 Study Conclusions  - Left ventricle: The cavity size was normal. There was mild concentric hypertrophy. Systolic function was severely reduced. The estimated ejection fraction was in the range of 25% to 30%. Severe diffuse hypokinesis. Although no diagnostic regional wall motion abnormality was identified, this possibility cannot be completely excluded on the basis of this study. - Aortic valve: There was mild regurgitation. - Mitral valve: There was mild regurgitation directed centrally. - Left atrium: The atrium was severely dilated. - Right atrium: The atrium was severely dilated. - Pulmonary arteries: Systolic pressure was mildly increased. PA peak pressure: 48 mm Hg (S).  Patient Profile     84 y.o. male a hx of cardiomyopathy/ chronic systolic HF with EF of 44-01% (presumed to be tachy mediated), PAF w/ prior cardioversion and treatment w/ amiodarone and Eliquis, h/o CKD and recent outpatient treatment for presumed CAP,who is being seen today for the evaluation ofacute on chronic  systolic HF and atrial fibrillation w/ RVRat the request of Dr. Wendee Beavers, Internal Medicine.  Assessment & Plan    1.  Acute on chronic systolic congestive heart failure: I/O -1702. Weight 173.  Much improved today.  Change Lasix to 40 mg by mouth twice daily.  Follow renal function.  Continue fluid restriction and low-sodium diet.  2.  Persistent atrial fibrillation: Patient remains in sinus rhythm status post cardioversion.  Change amiodarone to 200 mg daily.  Continue apixaban.  3.  Chronic kidney disease stage III: Renal function unchanged today.  4.  Cardiomyopathy: Per echocardiogram LVEF 25 to 30%; we are avoiding beta blockade as he has a history of bradycardia while on combination of beta-blocker and  amiodarone.  No ACE inhibitor or ARB given renal insufficiency.  5.  Hyperkalemia: Resolved.    Patient can be discharged from a cardiac standpoint although may need physical therapy and ambulation to improve strength.  We will check potassium and renal function in 1 week.  He will need a transition of care appointment in 1 week.  Follow-up Dr. Percival Spanish 6 to 8 weeks.  We will sign off.  Please call with questions.  Signed, Kirk Ruths MD [facility] 04/04/2018, 6:41 AM    For questions or updates, please contact   Please consult [email or web address] for contact info under Cardiology/STEMI.

## 2018-04-04 NOTE — Discharge Summary (Signed)
Physician Discharge Summary  [name] [name] VOZ:[id number] DOB: 1920/07/26  PCP: Cassandria Anger, MD  Admit date: 03/30/2018 Discharge date: 04/04/2018  Recommendations for Outpatient Follow-up:  1. Dr. Lew Dawes, PCP in 1 week with repeat labs (CBC & CMP). 2. Dr. Minus Breeding, Cardiology: Office will arrange outpatient follow-up for transition of care appointment in 1 week and MD follow-up in 6-8 weeks. 3. Follow urine microscopy in 2 to 3 weeks.  Home Health: PT & RN Equipment/Devices: None  Discharge Condition: Improved and stable CODE STATUS: Full Diet recommendation: Heart healthy diet.  Discharge Diagnoses:  Active Problems:   Essential hypertension   CKD (chronic kidney disease) stage 3, GFR 30-59 ml/min (HCC)   Systolic CHF, chronic (HCC)   Atrial fibrillation with RVR (HCC)   Anxiety   Brief Summary: 84 year old male with PMH of cardiomyopathy (tachycardia mediated), chronic systolic CHF (LVEF 25- 42%), PAF with prior cardioversion and on amiodarone and Eliquis, stage III chronic kidney disease (baseline creatinine 1.6-2), right eye blindness, anxiety and depression, HTN, HLD, recently treated for CAP 2 weeks PTA, presented to ED due to progressive lower extremity edema and exertional dyspnea.  Admitted for acute on chronic systolic CHF.  Cardiology was consulted and underwent successful TEE cardioversion 04/01/2018 at [facility].    Assessment & Plan:   Acute on chronic systolic CHF/cardiomyopathy: TTE 03/31/2018: LVEF 20-25% with diffuse hypokinesis, worse than prior.  Cardiology consulted and felt that the cardiomyopathy was related to tachycardia.    He was treated with IV Lasix, 40 mg increased to 80 mg twice daily.    -8.7 L since admission.  Weight down from 192 pounds on admission to 173 pounds.  Remains volume overloaded but continues to gradually improve.  No ACEI/ARB due to renal insufficiency and hyperkalemia but may consider adding later if BP and renal  function allow.  Cardiology has seen today, changed Lasix to 40 mg twice daily by mouth, cleared him for discharge home and will arrange outpatient follow-up.  Persistent atrial fibrillation: Cardiology was consulted.  Amiodarone increased to 200 mg daily.  IV Cardizem drip discontinued prior to DCCV.  Continue low-dose Eliquis 2.5 mg twice daily.  TSH normal. He is status post TEE and successful cardioversion 5/15.  Remains in sinus rhythm post cardioversion.  While hospitalized, amiodarone increased to 400 mg twice daily.  Of note patient had significant bradycardia with addition of beta-blocker in the past and therefore avoiding same.  Remains in sinus rhythm/stable.  Cardiology has seen today and have changed amiodarone to 200 mg daily.  Cardiology has cleared for discharge and will arrange outpatient follow-up.  Stage III chronic kidney disease: Recent baseline creatinine appears to be 1.6 range.  Creatinine has decreased from 1.9 >1 0.85 > 1.87 >1.92.  Likely related to diuresis.  Follow BMP closely next week.  Stable.  Hyperkalemia/hypokalemia: Resolved.  Closely follow CMP as outpatient.  Essential hypertension: Controlled.  Hyperlipidemia: Continue pravastatin.  Anxiety: Continue bedtime scheduled Xanax.  Isolated hyperbilirubinemia: Unclear etiology.?  Passive hepatic congestion.  Total bilirubin has increased from 1.6>2.6 >3.2 > 3.1 and predominantly indirect hyperbilirubinemia. ?  Gilbert syndrome. Outpatient follow-up with repeat labs.  NSVT/NSSVT: Continue amiodarone.  Magnesium 2.  Thrombocytopenia: Appears chronic.  Stable.  Microscopic hematuria: Asymptomatic.  Noted on urine microscopy.  Unclear etiology.  Patient on anticoagulation.  Follow urine microscopy in 2 to 3 weeks and consider evaluation if persists.    Consultants:  Cardiology  Procedures:  TEE and cardioversion 04/01/2018:  Brief TEE Note  LVEF 20% Diffuse hypokinesis No LA/LAA thrombus or  mass Mild-moderate mitral regurgitation with multiple jets Mild TR, PR and AR Small posterior pericardial effusion L pleural effusion noted Moderate atherosclerosis of the descending aorta Severe enlargement of the LA.    Discharge Instructions  Discharge Instructions    (HEART FAILURE PATIENTS) Call MD:  Anytime you have any of the following symptoms: 1) 3 pound weight gain in 24 hours or 5 pounds in 1 week 2) shortness of breath, with or without a dry hacking cough 3) swelling in the hands, feet or stomach 4) if you have to sleep on extra pillows at night in order to breathe.   Complete by:  As directed    Call MD for:  difficulty breathing, headache or visual disturbances   Complete by:  As directed    Call MD for:  extreme fatigue   Complete by:  As directed    Call MD for:  persistant dizziness or light-headedness   Complete by:  As directed    Diet - low sodium heart healthy   Complete by:  As directed    Increase activity slowly   Complete by:  As directed        Medication List    TAKE these medications   ALPRAZolam 1 MG tablet Commonly known as:  XANAX Take 1 tablet (1 mg total) by mouth at bedtime.   amiodarone 200 MG tablet Commonly known as:  PACERONE Take 1 tablet (200 mg total) by mouth daily. What changed:    how much to take  when to take this  additional instructions   apixaban 2.5 MG Tabs tablet Commonly known as:  ELIQUIS Take 1 tablet (2.5 mg total) by mouth 2 (two) times daily.   benzonatate 100 MG capsule Commonly known as:  TESSALON Take 1 capsule (100 mg total) by mouth 3 (three) times daily as needed. What changed:  reasons to take this   finasteride 5 MG tablet Commonly known as:  PROSCAR Take 1 tablet (5 mg total) by mouth daily.   furosemide 40 MG tablet Commonly known as:  LASIX Take 1 tablet (40 mg total) by mouth 2 (two) times daily. What changed:    medication strength  how much to take  when to take this  reasons  to take this   linaclotide 290 MCG Caps capsule Commonly known as:  LINZESS Take 1 capsule (290 mcg total) by mouth daily before breakfast. What changed:    how much to take  when to take this  reasons to take this   polyethylene glycol packet Commonly known as:  MIRALAX / GLYCOLAX Take 17 g by mouth at bedtime.   pravastatin 20 MG tablet Commonly known as:  PRAVACHOL Take 1 tablet (20 mg total) by mouth daily.   promethazine-codeine 6.25-10 MG/5ML syrup Commonly known as:  PHENERGAN with CODEINE Take 5 mLs by mouth every 6 (six) hours as needed for cough.   ranitidine 150 MG tablet Commonly known as:  ZANTAC Take 1 tablet (150 mg total) by mouth 2 (two) times daily as needed for heartburn.   senna 8.6 MG Tabs tablet Commonly known as:  SENOKOT Take 1-2 tablets (8.6-17.2 mg total) by mouth at bedtime.   VENTOLIN HFA 108 (90 Base) MCG/ACT inhaler Generic drug:  albuterol Inhale 2 puffs into the lungs every 6 (six) hours as needed for wheezing or shortness of breath.      Follow-up Information    Minus Breeding, MD Follow up.  Specialty:  Cardiology Why:  offce will call with time and date Contact information: [address] [place] [address] [phone]        Plotnikov, Evie Lacks, MD. Schedule an appointment as soon as possible for a visit in 1 week(s).   Specialty:  Internal Medicine Why:  To be seen with repeat labs (CBC & CMP). Contact information: [address]  [place] [address] [phone]          Allergies  Allergen Reactions  . Lisinopril Cough      Procedures/Studies:  Dg Chest Port 1 View  Result Date: 03/30/2018 CLINICAL DATA:  Left leg edema and weakness EXAM: PORTABLE CHEST 1 VIEW COMPARISON:  03/11/2018 FINDINGS: Chronic cardiomegaly. Atherosclerotic calcification. Pleural effusions on the right more than left. The left pleural effusion is small. The right pleural effusion is small to moderate. The  underlying lungs are obscured. No Kerley lines. No pneumothorax. Central vascular congestion. Severe glenohumeral osteoarthritis. IMPRESSION: Cardiomegaly with vascular congestion and right more than left pleural effusion. Electronically Signed   By: Monte Fantasia M.D.   On: 03/30/2018 18:20      Subjective: Seen this morning.  States that he feels fine.  Denies dyspnea.  Not on oxygen and not hypoxic.  Leg edema continues to gradually improve.  Denies any other complaints.  As per RN, no acute issues noted.  Discharge Exam:  Vitals:   04/03/18 1415 04/03/18 1837 04/03/18 2015 04/04/18 0452  BP: 114/78  115/82 110/78  Pulse: 72  79 70  Resp: 18  18 18   Temp:  97.6 F (36.4 C) (!) 97 F (36.1 C) 97.6 F (36.4 C)  TempSrc:  Oral  Oral  SpO2: 94%  94% 90%  Weight:    78.6 kg (173 lb 4.5 oz)  Height:         General exam: Pleasant elderly male, moderately built and overweight lying comfortably supine in bed. Respiratory system: Clear to auscultation.  No increased work of breathing.   Cardiovascular system: S1 and S2 heard, RRR.  No JVD or murmurs.  1+ pitting bilateral leg edema, continues to gradually improve.  Telemetry personally reviewed: Remains in sinus rhythm.  Had an episode of nonsustained SVT. Gastrointestinal system: Abdomen is nondistended, soft and nontender. No organomegaly or masses felt. Normal bowel sounds heard.   Central nervous system: Alert and oriented. No focal neurological deficits.  Extremities: Symmetric 5 x 5 power.  Skin: No rashes, lesions or ulcers Psychiatry: Judgement and insight appear normal. Mood & affect appropriate.  Eyes: Corneal opacity right eye with blindness.     The results of significant diagnostics from this hospitalization (including imaging, microbiology, ancillary and laboratory) are listed below for reference.     Microbiology: Recent Results (from the past 240 hour(s))  Blood culture (routine x 2)     Status: None    Collection Time: 03/30/18  5:50 PM  Result Value Ref Range Status   Specimen Description   Final    BLOOD LEFT ANTECUBITAL Performed at [facility] [address], [place], [place] [address]    Special Requests   Final    BOTTLES DRAWN AEROBIC AND ANAEROBIC Blood Culture adequate volume Performed at [facility] [address] [place], [place] [address]    Culture   Final    NO GROWTH 5 DAYS Performed at [facility], [place] [address], [place], [place] [address]    Report Status 04/04/2018 FINAL  Final  Urine culture     Status: Abnormal   Collection Time: 03/30/18  5:50 PM  Result Value Ref Range Status   Specimen Description   Final    URINE, RANDOM Performed at [facility] [address] [place], [place] [address]    Special Requests   Final    NONE Performed at [facility], [place] [address] [place], [place] [address]    Culture 40,000 COLONIES/mL ESCHERICHIA COLI (A)  Final   Report Status 04/02/2018 FINAL  Final   Organism ID, Bacteria ESCHERICHIA COLI (A)  Final      Susceptibility   Escherichia coli - MIC*    AMPICILLIN 8 SENSITIVE Sensitive     CEFAZOLIN <=4 SENSITIVE Sensitive     CEFTRIAXONE <=1 SENSITIVE Sensitive     CIPROFLOXACIN <=0.25 SENSITIVE Sensitive     GENTAMICIN <=1 SENSITIVE Sensitive     IMIPENEM <=0.25 SENSITIVE Sensitive     NITROFURANTOIN <=16 SENSITIVE Sensitive     TRIMETH/SULFA <=20 SENSITIVE Sensitive     AMPICILLIN/SULBACTAM 4 SENSITIVE Sensitive     PIP/TAZO <=4 SENSITIVE Sensitive     Extended ESBL NEGATIVE Sensitive     * 40,000 COLONIES/mL ESCHERICHIA COLI  Blood culture (routine x 2)     Status: None   Collection Time: 03/30/18  5:58 PM  Result Value Ref Range Status   Specimen Description   Final    BLOOD RIGHT FOREARM Performed at [facility] [address], [place], [place] [address]    Special Requests   Final     BOTTLES DRAWN AEROBIC AND ANAEROBIC Blood Culture adequate volume Performed at [facility] [address], [place], [place] [address]    Culture   Final    NO GROWTH 5 DAYS Performed at [facility], [place] [address], [place], [place] [address]    Report Status 04/04/2018 FINAL  Final  MRSA PCR Screening     Status: None   Collection Time: 03/30/18 10:24 PM  Result Value Ref Range Status   MRSA by PCR NEGATIVE NEGATIVE Final    Comment:        The GeneXpert MRSA Assay (FDA approved for NASAL specimens only), is one component of a comprehensive MRSA colonization surveillance program. It is not intended to diagnose MRSA infection nor to guide or monitor treatment for MRSA infections. Performed at [facility], [place] [address], [place], [place] [address]      Labs: CBC: Recent Labs  Lab 03/30/18 1749 04/02/18 0320  WBC 6.2 6.3  NEUTROABS 4.6  --   HGB 16.7 15.9  HCT 52.2* 49.2  MCV 100.0 98.8  PLT 146* 371*   Basic Metabolic Panel: Recent Labs  Lab 03/30/18 2244 03/31/18 0646 04/01/18 0313 04/02/18 0320 04/03/18 0420 04/04/18 0358  NA  --  144 139 141 141 141  K  --  4.1 5.5* 3.7 3.4* 3.7  CL  --  103 100* 99* 94* 91*  CO2  --  28 27 29  34* 35*  GLUCOSE  --  108* 119* 111* 108* 108*  BUN  --  30* 32* 32* 37* 41*  CREATININE  --  1.69* 1.90* 1.85* 1.87* 1.92*  CALCIUM  --  8.8* 8.4* 8.4* 8.6* 8.7*  MG 2.2 2.0  --   --   --   --    Liver Function Tests: Recent Labs  Lab 03/30/18 1749 04/02/18 0320 04/04/18 0358  AST  30 15 14*  ALT 34 21 14*  ALKPHOS 81 65 67  BILITOT 2.6* 3.6* 3.5*  PROT 6.7 6.1* 6.5  ALBUMIN 3.7 3.4* 3.4*   BNP (last 3 results) Recent Labs    04/06/17 1418 03/30/18 1750  BNP 574.4* 758.0*   Cardiac Enzymes: Recent Labs  Lab 03/30/18 2244 03/31/18 0646  TROPONINI 0.03* 0.03*   Urinalysis    Component Value Date/Time   COLORURINE YELLOW 03/30/2018 1750   APPEARANCEUR CLEAR  03/30/2018 1750   LABSPEC 1.013 03/30/2018 1750   PHURINE 5.0 03/30/2018 1750   GLUCOSEU NEGATIVE 03/30/2018 1750   GLUCOSEU NEGATIVE 10/23/2017 1245   HGBUR MODERATE (A) 03/30/2018 1750   BILIRUBINUR NEGATIVE 03/30/2018 1750   KETONESUR NEGATIVE 03/30/2018 1750   PROTEINUR NEGATIVE 03/30/2018 1750   UROBILINOGEN 0.2 10/23/2017 1245   NITRITE NEGATIVE 03/30/2018 1750   LEUKOCYTESUR NEGATIVE 03/30/2018 1750   Patient's care was discussed in detail with patient's son.  Updated care and answered questions.  Son concerned that patient is weak and fall risk and ideally should go to rehab but patient declines and wishes to return home.  A male friend stays with patient 24/7, has good neighbors nearby and son lives a couple of blocks away.  Son is a retired Community education officer.   Time coordinating discharge: 45 minutes  SIGNED:  Vernell Leep, MD, FACP, [facility] Pager [phone]  If 7PM-7AM, please contact night-coverage [email or web address] Password [email or web address] 04/04/2018, 1:21 PM

## 2018-04-06 ENCOUNTER — Telehealth: Payer: Self-pay | Admitting: *Deleted

## 2018-04-06 NOTE — Telephone Encounter (Signed)
Transition Care Management Follow-up Telephone Call   Date discharged? 04/04/18   How have you been since you were released from the hospital? Pt states he is doing ok   Do you understand why you were in the hospital? YES   Do you understand the discharge instructions? YES   Where were you discharged to? Home   Items Reviewed:  Medications reviewed: YES  Allergies reviewed: YES  Dietary changes reviewed: YES  Referrals reviewed: No referral needed   Functional Questionnaire:   Activities of Daily Living (ADLs):   He states he are independent in the following: bathing and hygiene, feeding, continence, grooming, toileting and dressing States they require assistance with the following: ambulation   Any transportation issues/concerns?: NO   Any patient concerns? NO   Confirmed importance and date/time of follow-up visits scheduled YES, pt only want to see Dr. Camila Li, and not able to come this week due to having dermatology procedure. MD is out of the office next week so made appt for 04/22/18  Provider Appointment booked with Dr. Alain Marion  Confirmed with patient if condition begins to worsen call PCP or go to the ER.  Patient was given the office number and encouraged to call back with question or concerns.  : YES

## 2018-04-07 ENCOUNTER — Other Ambulatory Visit: Payer: Self-pay | Admitting: Internal Medicine

## 2018-04-07 NOTE — Progress Notes (Signed)
[name] [phone] was called to check on pt's [facility] with [facility]. This CM returned call to [facility] with no answer, VM was left. [facility] was made to check on the status of [facility] for this pt. Information was given to in house rep with [facility] to follow up on. Rep called the office of [facility], who are checking into this case now.

## 2018-04-08 DIAGNOSIS — I482 Chronic atrial fibrillation: Secondary | ICD-10-CM | POA: Diagnosis not present

## 2018-04-08 DIAGNOSIS — M19011 Primary osteoarthritis, right shoulder: Secondary | ICD-10-CM | POA: Diagnosis not present

## 2018-04-08 DIAGNOSIS — F419 Anxiety disorder, unspecified: Secondary | ICD-10-CM | POA: Diagnosis not present

## 2018-04-08 DIAGNOSIS — I429 Cardiomyopathy, unspecified: Secondary | ICD-10-CM | POA: Diagnosis not present

## 2018-04-08 DIAGNOSIS — E785 Hyperlipidemia, unspecified: Secondary | ICD-10-CM | POA: Diagnosis not present

## 2018-04-08 DIAGNOSIS — I130 Hypertensive heart and chronic kidney disease with heart failure and stage 1 through stage 4 chronic kidney disease, or unspecified chronic kidney disease: Secondary | ICD-10-CM | POA: Diagnosis not present

## 2018-04-08 DIAGNOSIS — N183 Chronic kidney disease, stage 3 (moderate): Secondary | ICD-10-CM | POA: Diagnosis not present

## 2018-04-08 DIAGNOSIS — M19012 Primary osteoarthritis, left shoulder: Secondary | ICD-10-CM | POA: Diagnosis not present

## 2018-04-08 DIAGNOSIS — F329 Major depressive disorder, single episode, unspecified: Secondary | ICD-10-CM | POA: Diagnosis not present

## 2018-04-08 DIAGNOSIS — K219 Gastro-esophageal reflux disease without esophagitis: Secondary | ICD-10-CM | POA: Diagnosis not present

## 2018-04-08 DIAGNOSIS — I5023 Acute on chronic systolic (congestive) heart failure: Secondary | ICD-10-CM | POA: Diagnosis not present

## 2018-04-08 DIAGNOSIS — Z7901 Long term (current) use of anticoagulants: Secondary | ICD-10-CM | POA: Diagnosis not present

## 2018-04-08 DIAGNOSIS — N400 Enlarged prostate without lower urinary tract symptoms: Secondary | ICD-10-CM | POA: Diagnosis not present

## 2018-04-09 ENCOUNTER — Telehealth: Payer: Self-pay | Admitting: Internal Medicine

## 2018-04-09 NOTE — Telephone Encounter (Signed)
Okay. Thank you.

## 2018-04-09 NOTE — Telephone Encounter (Signed)
LM giving verbals, FYI 

## 2018-04-09 NOTE — Telephone Encounter (Signed)
Copied from [place] [id number]. Topic: Quick Communication - See Telephone Encounter >> Apr 09, 2018  4:17 PM Vernona Rieger wrote: CRM for notification. See Telephone encounter for: 04/09/18.  [name] [name], physical therapist from [facility] called and stated that he was just recently in the hospital. He is requesting to see him for physical therapy for 2 times a week for 3 weeks for functional mobility and cardiac rehab.  Call back is [phone]

## 2018-04-14 ENCOUNTER — Ambulatory Visit: Payer: Medicare Other | Admitting: Cardiology

## 2018-04-14 ENCOUNTER — Encounter: Payer: Self-pay | Admitting: Cardiology

## 2018-04-14 ENCOUNTER — Telehealth: Payer: Self-pay | Admitting: Cardiology

## 2018-04-14 VITALS — BP 122/72 | HR 113 | Ht 67.0 in | Wt 169.8 lb

## 2018-04-14 DIAGNOSIS — I4891 Unspecified atrial fibrillation: Secondary | ICD-10-CM

## 2018-04-14 DIAGNOSIS — I420 Dilated cardiomyopathy: Secondary | ICD-10-CM

## 2018-04-14 DIAGNOSIS — Z7901 Long term (current) use of anticoagulants: Secondary | ICD-10-CM

## 2018-04-14 DIAGNOSIS — I5043 Acute on chronic combined systolic (congestive) and diastolic (congestive) heart failure: Secondary | ICD-10-CM

## 2018-04-14 DIAGNOSIS — I5042 Chronic combined systolic (congestive) and diastolic (congestive) heart failure: Secondary | ICD-10-CM | POA: Insufficient documentation

## 2018-04-14 DIAGNOSIS — I480 Paroxysmal atrial fibrillation: Secondary | ICD-10-CM

## 2018-04-14 DIAGNOSIS — N183 Chronic kidney disease, stage 3 unspecified: Secondary | ICD-10-CM

## 2018-04-14 DIAGNOSIS — I10 Essential (primary) hypertension: Secondary | ICD-10-CM

## 2018-04-14 NOTE — Patient Instructions (Addendum)
Medication Instructions: Take the Amiodarone 200 mg daily.   If you need a refill on your cardiac medications before your next appointment, please call your pharmacy.   Labwork: Your provider would like for you to have the following labs today: BMET   Follow-Up: Your physician wants you to follow-up in one or two weeks with Kerin Ransom PA. You will receive a reminder letter in the mail two months in advance. If you don't receive a letter, please call our office at [phone] to schedule this follow-up appointment.   Thank you for choosing [facility] at [facility]!!

## 2018-04-14 NOTE — Assessment & Plan Note (Signed)
EF 20% by TEE May 2019 when he was in AF

## 2018-04-14 NOTE — Telephone Encounter (Signed)
Patient in office to see Doreene Burke PA currently

## 2018-04-14 NOTE — Assessment & Plan Note (Signed)
Low dose Eliquis 

## 2018-04-14 NOTE — Assessment & Plan Note (Signed)
Pt admitted 03/30/18-04/04/18 with CHF, AF with RVR, and AKI. Wgt stable today (169.8 lbs) compared to DC weight (173 lbs).

## 2018-04-14 NOTE — Assessment & Plan Note (Signed)
Pt's Amiodarone decreased to 100 mg 3 times a week in Feb 2019- He presented with AF with RVR May 2019- reloaded with Amiodarone and DCCV. The pt looks to be in A flutter today. He is taking Amiodarone 200 mg 3 times a week (not 200 mg daily as prescribed at discharge).

## 2018-04-14 NOTE — Progress Notes (Signed)
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 He had been controlled with Amiodarone 3 times a week. This was cut back to 100 mg thre times a week in Feb 2019. He presented to the ED 03/30/18 with dyspnea, AKI, AF with RVR and a weight of 192 lbs, almost 20 lbs over his baseline. He was admitted and placed on Diltiazem for rate control. He was re loaded with Amiodarone. He underwent TEE CV to NSR. When discharge home his weight was down to 173 lbs. He is seen in the office today for TOC follow up. He is doing well, still weak. His weight today is 169.8 lbs. He is taking his Lasix BID but he is only taking Amiodarone 200 mg MWF. In the office his HR is 115- RBBB, I suspect this is A flutter.    Current Outpatient Medications  Medication Sig Dispense Refill  . ALPRAZolam (XANAX) 1 MG tablet Take 1 tablet (1 mg total) by mouth at bedtime. 90 tablet 1  . amiodarone (PACERONE) 200 MG tablet Take 1 tablet (200 mg total) by mouth daily. 30 tablet 0  . apixaban (ELIQUIS) 2.5 MG TABS tablet Take 1 tablet (2.5 mg total) by mouth 2 (two) times daily. 60 tablet 11  . benzonatate (TESSALON) 100 MG capsule Take 1 capsule (100 mg total) by mouth 3 (three) times daily as needed. (Patient taking differently: Take 100 mg by mouth 3 (three) times daily as needed for cough. ) 20 capsule 0  . finasteride (PROSCAR) 5 MG tablet Take 1 tablet (5 mg total) by mouth daily. 90 tablet 3  . furosemide (LASIX) 40 MG tablet Take 1 tablet (40 mg total) by mouth 2 (two) times daily. 60 tablet 0  . linaclotide (LINZESS) 290 MCG CAPS capsule Take 1 capsule (290 mcg total) by mouth daily before breakfast. (Patient taking differently: Take 28 mcg by mouth daily as needed (constipation). ) 28 capsule 0  . polyethylene glycol (MIRALAX /  GLYCOLAX) packet Take 17 g by mouth at bedtime.    . pravastatin (PRAVACHOL) 20 MG tablet TAKE 1 TABLET EACH DAY. 30 tablet 11  . promethazine-codeine (PHENERGAN WITH CODEINE) 6.25-10 MG/5ML syrup Take 5 mLs by mouth every 6 (six) hours as needed for cough. 300 mL 1  . ranitidine (ZANTAC) 150 MG tablet Take 1 tablet (150 mg total) by mouth 2 (two) times daily as needed for heartburn. 180 tablet 3  . senna (SENOKOT) 8.6 MG TABS tablet Take 1-2 tablets (8.6-17.2 mg total) by mouth at bedtime. 120 each 11  . VENTOLIN HFA 108 (90 Base) MCG/ACT inhaler Inhale 2 puffs into the lungs every 6 (six) hours as needed for wheezing or shortness of breath.   0   No current facility-administered medications for this visit.     Allergies  Allergen Reactions  . Lisinopril Cough    Past Medical History:  Diagnosis Date  . Anxiety   . Arthritis    "shoulders" (05/28/2016)  . Cardiomyopathy   . Coronary artery disease   . Depression    hx (05/28/2016)  . Hemorrhoids   . Hyperlipidemia   . Hypertension   . Skipped heart beats     Social History   Socioeconomic History  . Marital status: Widowed    Spouse name: Not  on file  . Number of children: 1  . Years of education: Not on file  . Highest education level: Not on file  Occupational History  . Occupation: Retired  Scientific laboratory technician  . Financial resource strain: Not on file  . Food insecurity:    Worry: Not on file    Inability: Not on file  . Transportation needs:    Medical: Not on file    Non-medical: Not on file  Tobacco Use  . Smoking status: Never Smoker  . Smokeless tobacco: Never Used  Substance and Sexual Activity  . Alcohol use: No  . Drug use: No  . Sexual activity: Yes  Lifestyle  . Physical activity:    Days per week: Not on file    Minutes per session: Not on file  . Stress: Not on file  Relationships  . Social connections:    Talks on phone: Not on file    Gets together: Not on file    Attends religious service: Not  on file    Active member of club or organization: Not on file    Attends meetings of clubs or organizations: Not on file    Relationship status: Not on file  . Intimate partner violence:    Fear of current or ex partner: Not on file    Emotionally abused: Not on file    Physically abused: Not on file    Forced sexual activity: Not on file  Other Topics Concern  . Not on file  Social History Narrative  . Not on file     Family History  Problem Relation Age of Onset  . Heart disease Brother      Review of Systems: General: negative for chills, fever, night sweats or weight changes.  Cardiovascular: negative for chest pain, dyspnea on exertion, edema, orthopnea, palpitations, paroxysmal nocturnal dyspnea or shortness of breath Dermatological: negative for rash Respiratory: negative for cough or wheezing Urologic: negative for hematuria Abdominal: negative for nausea, vomiting, diarrhea, bright red blood per rectum, melena, or hematemesis Neurologic: negative for visual changes, syncope, or dizziness All other systems reviewed and are otherwise negative except as noted above.    Blood pressure 122/72, pulse (!) 113, height 5\' 7"  (1.702 m), weight 169 lb 12.8 oz (77 kg).  General appearance: alert, cooperative and no distress Neck: no carotid bruit and no JVD Lungs: clear to auscultation bilaterally Heart: fast rate, regular rythm Extremities: no edema Skin: Skin color, texture, turgor normal. No rashes or lesions Neurologic: Grossly normal  EKG A flutter with RBBB -rate 113  ASSESSMENT AND PLAN:   Acute on chronic combined systolic and diastolic CHF (congestive heart failure) (HCC) Pt admitted 03/30/18-04/04/18 with CHF, AF with RVR, and AKI. Wgt stable today (169.8 lbs) compared to DC weight (173 lbs).  Atrial fibrillation with RVR (HCC) Pt's Amiodarone decreased to 100 mg 3 times a week in Feb 2019- He presented with AF with RVR May 2019- reloaded with Amiodarone and  DCCV. The pt looks to be in A flutter today. He is taking Amiodarone 200 mg 3 times a week (not 200 mg daily as prescribed at discharge).   Congestive dilated cardiomyopathy (HCC) EF 20% by TEE May 2019 when he was in AF  CKD (chronic kidney disease) stage 3, GFR 30-59 ml/min (HCC) GFR 28- SCr 1.9 at discharge May 2019  Chronic anticoagulation Low dose Eliquis   PLAN  Increase Amiodarone to 200 mg daily. Check BMP- adjust Lasix dosing if needed. F/U in  1-2 weeks for an EKG.   Kerin Ransom PA-C 04/14/2018 4:00 PM

## 2018-04-14 NOTE — Assessment & Plan Note (Signed)
GFR 28- SCr 1.9 at discharge May 2019

## 2018-04-14 NOTE — Telephone Encounter (Signed)
New Patient    Pt c/o swelling: STAT is pt has developed SOB within 24 hours  1) How much weight have you gained and in what time span? 7lbs 24 hours  2) If swelling, where is the swelling located? sweeling in abdomen  3) Are you currently taking a fluid pill? Yes    4) Are you currently SOB? No   5) Do you have a log of your daily weights (if so, list)? 169, 162 (they are not positive about the 162 because the patient can not see well)   6) Have you gained 3 pounds in a day or 5 pounds in a week? 7lb in 24  Have you traveled recently? No    STAT if HR is under 50 or over 120 (normal HR is 60-100 beats per minute)  1) What is your heart rate? 63   2) Do you have a log of your heart rate readings (document readings)? 63, 44 (she just wants to make [place] aware that his heart rate did go down to 44 and it was irregular.  3) Do you have any other symptoms? Swelling in abdomen   Patient has an appointment today and Nicky with [facility] wanted to provide information for patients appointment

## 2018-04-15 LAB — BASIC METABOLIC PANEL
BUN/Creatinine Ratio: 20 (ref 10–24)
BUN: 37 mg/dL — ABNORMAL HIGH (ref 10–36)
CO2: 27 mmol/L (ref 20–29)
Calcium: 9.3 mg/dL (ref 8.6–10.2)
Chloride: 98 mmol/L (ref 96–106)
Creatinine, Ser: 1.83 mg/dL — ABNORMAL HIGH (ref 0.76–1.27)
GFR calc Af Amer: 35 mL/min/{1.73_m2} — ABNORMAL LOW (ref >59)
GFR calc non Af Amer: 30 mL/min/{1.73_m2} — ABNORMAL LOW (ref >59)
Glucose: 88 mg/dL (ref 65–99)
Potassium: 5.1 mmol/L (ref 3.5–5.2)
Sodium: 144 mmol/L (ref 134–144)

## 2018-04-16 ENCOUNTER — Telehealth: Payer: Self-pay | Admitting: Cardiology

## 2018-04-16 MED ORDER — FUROSEMIDE 40 MG PO TABS: 40.0000 mg | ORAL_TABLET | Freq: Every day | ORAL | 0 refills | Status: DC

## 2018-04-16 NOTE — Telephone Encounter (Signed)
Received call from Jum PT with [facility].  He is with patient and is calling to report abnormal vital signs.   Sitting BP 90/60 HR 48 Standing 70/50, unable to obtain HR, denied dizziness, lightheadedness, etc.    Clair Gulling states patients automatic cuff were reading WNL but manually it was as reported above.  Patient also having issues urinating all night, patient reports taking pm dose of lasix at 6pm.   Patients weight this AM when Clair Gulling arrived was 165, patient cannot see the weight on the scale in order to weight himself.   Denies swelling or SOB.  No weakness, no dz, lightheadedness.   Clair Gulling states that patients BP is usually >100-60-80s.  He states HR is normally on the low end.   Jim requesting parameters for BP and HR (what they need to call us for) as well as fluid restrictions.  Advised patient to take PM lasix earlier in order to help with the urination at night.     Clair Gulling request orders (parameters and fluid restrictions) be faxed to [phone] attn: Ann Lions RN  Routed to PA to review/advise.

## 2018-04-16 NOTE — Telephone Encounter (Signed)
Patient made aware and verbalized understanding-med list updated.

## 2018-04-16 NOTE — Telephone Encounter (Signed)
Have him cut his Lasix back to once a day  Kerin Ransom PA-C 04/16/2018 4:08 PM

## 2018-04-17 NOTE — Telephone Encounter (Signed)
Follow up   Nickey from [facility] calling to report BP. Today's BP 100/70  Please call to confirm Lasix dosage.

## 2018-04-17 NOTE — Telephone Encounter (Addendum)
Spoke with [place] and advised Lasix was decreased to just once a day, verbalized understanding

## 2018-04-22 ENCOUNTER — Ambulatory Visit: Payer: Medicare Other | Admitting: Internal Medicine

## 2018-04-22 ENCOUNTER — Encounter: Payer: Self-pay | Admitting: Internal Medicine

## 2018-04-22 ENCOUNTER — Telehealth: Payer: Self-pay | Admitting: Cardiology

## 2018-04-22 DIAGNOSIS — I480 Paroxysmal atrial fibrillation: Secondary | ICD-10-CM

## 2018-04-22 DIAGNOSIS — I5022 Chronic systolic (congestive) heart failure: Secondary | ICD-10-CM

## 2018-04-22 DIAGNOSIS — N183 Chronic kidney disease, stage 3 unspecified: Secondary | ICD-10-CM

## 2018-04-22 DIAGNOSIS — I420 Dilated cardiomyopathy: Secondary | ICD-10-CM | POA: Diagnosis not present

## 2018-04-22 MED ORDER — ALPRAZOLAM 1 MG PO TABS: 1.0000 mg | ORAL_TABLET | Freq: Every day | ORAL | 1 refills | Status: DC

## 2018-04-22 NOTE — Telephone Encounter (Signed)
New Message:    Please call [name] [name] asap,he is at the pt's home.Pt having issues with low blood ppressure.

## 2018-04-22 NOTE — Progress Notes (Signed)
Subjective:  Patient ID: [name] [name], male    DOB: 03/13/1920  Age: 84 y.o. MRN: [id number]  CC: No chief complaint on file.   HPI [name] [name] presents for CHF, A fib, anxiety f/u. No bad cough...  Outpatient Medications Prior to Visit  Medication Sig Dispense Refill  . amiodarone (PACERONE) 200 MG tablet Take 1 tablet (200 mg total) by mouth daily. 30 tablet 0  . apixaban (ELIQUIS) 2.5 MG TABS tablet Take 1 tablet (2.5 mg total) by mouth 2 (two) times daily. 60 tablet 11  . benzonatate (TESSALON) 100 MG capsule Take 1 capsule (100 mg total) by mouth 3 (three) times daily as needed. (Patient taking differently: Take 100 mg by mouth 3 (three) times daily as needed for cough. ) 20 capsule 0  . finasteride (PROSCAR) 5 MG tablet Take 1 tablet (5 mg total) by mouth daily. 90 tablet 3  . furosemide (LASIX) 40 MG tablet Take 1 tablet (40 mg total) by mouth daily. 60 tablet 0  . linaclotide (LINZESS) 290 MCG CAPS capsule Take 1 capsule (290 mcg total) by mouth daily before breakfast. (Patient taking differently: Take 28 mcg by mouth daily as needed (constipation). ) 28 capsule 0  . polyethylene glycol (MIRALAX / GLYCOLAX) packet Take 17 g by mouth at bedtime.    . pravastatin (PRAVACHOL) 20 MG tablet TAKE 1 TABLET EACH DAY. 30 tablet 11  . promethazine-codeine (PHENERGAN WITH CODEINE) 6.25-10 MG/5ML syrup Take 5 mLs by mouth every 6 (six) hours as needed for cough. 300 mL 1  . ranitidine (ZANTAC) 150 MG tablet Take 1 tablet (150 mg total) by mouth 2 (two) times daily as needed for heartburn. 180 tablet 3  . senna (SENOKOT) 8.6 MG TABS tablet Take 1-2 tablets (8.6-17.2 mg total) by mouth at bedtime. 120 each 11  . ALPRAZolam (XANAX) 1 MG tablet Take 1 tablet (1 mg total) by mouth at bedtime. 90 tablet 1  . VENTOLIN HFA 108 (90 Base) MCG/ACT inhaler Inhale 2 puffs into the lungs every 6 (six) hours as needed for wheezing or shortness of breath.   0   No facility-administered medications prior to  visit.     ROS: Review of Systems  Constitutional: Negative for appetite change, fatigue and unexpected weight change.  HENT: Negative for congestion, nosebleeds, sneezing, sore throat and trouble swallowing.   Eyes: Negative for itching and visual disturbance.  Respiratory: Positive for cough.   Cardiovascular: Negative for chest pain, palpitations and leg swelling.  Gastrointestinal: Negative for abdominal distention, blood in stool, diarrhea and nausea.  Genitourinary: Negative for frequency and hematuria.  Musculoskeletal: Negative for back pain, gait problem, joint swelling and neck pain.  Skin: Negative for rash.  Neurological: Negative for dizziness, tremors, speech difficulty and weakness.  Psychiatric/Behavioral: Negative for agitation, dysphoric mood, sleep disturbance and suicidal ideas. The patient is nervous/anxious.     Objective:  BP 122/76 (BP Location: Left Arm, Patient Position: Sitting, Cuff Size: Normal)   Pulse 63   Temp 98.2 F (36.8 C) (Oral)   Ht 5\' 7"  (1.702 m)   Wt 173 lb (78.5 kg)   SpO2 98%   BMI 27.10 kg/m   BP Readings from Last 3 Encounters:  04/22/18 122/76  04/14/18 122/72  04/04/18 110/78    Wt Readings from Last 3 Encounters:  04/22/18 173 lb (78.5 kg)  04/14/18 169 lb 12.8 oz (77 kg)  04/04/18 173 lb 4.5 oz (78.6 kg)    Physical Exam  Constitutional: He is  oriented to person, place, and time. He appears well-developed. No distress.  NAD  HENT:  Mouth/Throat: Oropharynx is clear and moist.  Eyes: Pupils are equal, round, and reactive to light. Conjunctivae are normal.  Neck: Normal range of motion. No JVD present. No thyromegaly present.  Cardiovascular: Normal rate, normal heart sounds and intact distal pulses. Exam reveals no gallop and no friction rub.  No murmur heard. Pulmonary/Chest: Effort normal and breath sounds normal. No respiratory distress. He has no wheezes. He has no rales. He exhibits no tenderness.  Abdominal:  Soft. Bowel sounds are normal. He exhibits no distension and no mass. There is no tenderness. There is no rebound and no guarding.  Musculoskeletal: Normal range of motion. He exhibits no edema or tenderness.  Lymphadenopathy:    He has no cervical adenopathy.  Neurological: He is alert and oriented to person, place, and time. He has normal reflexes. No cranial nerve deficit. He exhibits normal muscle tone. He displays a negative Romberg sign. Coordination abnormal. Gait normal.  Skin: Skin is warm and dry. No rash noted.  Psychiatric: He has a normal mood and affect. His behavior is normal. Judgment and thought content normal.  ireg HR Cane  Lab Results  Component Value Date   WBC 6.3 04/02/2018   HGB 15.9 04/02/2018   HCT 49.2 04/02/2018   PLT 127 (L) 04/02/2018   GLUCOSE 88 04/14/2018   CHOL 112 03/30/2018   TRIG 80 03/30/2018   HDL 39 (L) 03/30/2018   LDLCALC 57 03/30/2018   ALT 14 (L) 04/04/2018   AST 14 (L) 04/04/2018   NA 144 04/14/2018   K 5.1 04/14/2018   CL 98 04/14/2018   CREATININE 1.83 (H) 04/14/2018   BUN 37 (H) 04/14/2018   CO2 27 04/14/2018   TSH 1.681 03/30/2018   PSA 0.24 03/08/2014   INR 1.32 03/30/2018   HGBA1C 5.7 (H) 03/31/2018    No results found.  Assessment & Plan:   There are no diagnoses linked to this encounter.   Meds ordered this encounter  Medications  . ALPRAZolam (XANAX) 1 MG tablet    Sig: Take 1 tablet (1 mg total) by mouth at bedtime.    Dispense:  90 tablet    Refill:  1     Follow-up: No follow-ups on file.  Walker Kehr, MD

## 2018-04-22 NOTE — Assessment & Plan Note (Signed)
on anticoagulation and amiodarone, Furosemide

## 2018-04-22 NOTE — Telephone Encounter (Signed)
Spoke with [name] [name], the patient woke this morning feeling tired and off balance. He has had a 5 lb weight gain overnight with no edema reported. His bp sitting is 80/60 with manual cuff and with the patients cuff bp is 123/65. [name] reports his pulse is irregular with rate of 49. The patient denies SOB and lungs are clear. His furosemide was recently decreased to once daily. He has an appointment with his medical doctor today. Will make dr hochrein aware.

## 2018-04-22 NOTE — Assessment & Plan Note (Signed)
anticoagulation and amiodarone

## 2018-04-22 NOTE — Assessment & Plan Note (Signed)
stable °

## 2018-04-23 ENCOUNTER — Telehealth: Payer: Self-pay | Admitting: Cardiology

## 2018-04-23 NOTE — Telephone Encounter (Signed)
I would suggest that he take an extra 40 mg x 1 of Lasix PO x 1

## 2018-04-23 NOTE — Telephone Encounter (Signed)
New Message:       Pt c/o BP issue: STAT if pt c/o blurred vision, one-sided weakness or slurred speech  1. What are your last 5 BP readings? 90/70: 15 minutes ago  2. Are you having any other symptoms (ex. Dizziness, headache, blurred vision, passed out)? No  3. What is your BP issue? Pt's BP is low and his HR is at 58. Pt has had a 5 lb weight gain since the adjustment of his entresto in 1 week

## 2018-04-23 NOTE — Telephone Encounter (Signed)
Received call from [place] with [facility] calling to report patient has had a 5 lb weight gain this week.B/P 90/70 pulse 58.No sob,ankles slightly swollen.PT called to report this yesterday.Patient saw PCP yesterday no changes made.Advised I will send message to Dr.Hochrein.

## 2018-04-24 NOTE — Telephone Encounter (Signed)
Pt made aware to take an extra dose of his lasix tomarow because pt stated he's on his way to a funeral, pt will take 80 mg in the morning, pt voice understanding.

## 2018-04-27 DIAGNOSIS — N183 Chronic kidney disease, stage 3 (moderate): Secondary | ICD-10-CM | POA: Diagnosis not present

## 2018-04-27 DIAGNOSIS — I429 Cardiomyopathy, unspecified: Secondary | ICD-10-CM | POA: Diagnosis not present

## 2018-04-27 DIAGNOSIS — N400 Enlarged prostate without lower urinary tract symptoms: Secondary | ICD-10-CM | POA: Diagnosis not present

## 2018-04-27 DIAGNOSIS — I130 Hypertensive heart and chronic kidney disease with heart failure and stage 1 through stage 4 chronic kidney disease, or unspecified chronic kidney disease: Secondary | ICD-10-CM | POA: Diagnosis not present

## 2018-04-27 DIAGNOSIS — M19011 Primary osteoarthritis, right shoulder: Secondary | ICD-10-CM | POA: Diagnosis not present

## 2018-04-27 DIAGNOSIS — I482 Chronic atrial fibrillation: Secondary | ICD-10-CM | POA: Diagnosis not present

## 2018-04-27 DIAGNOSIS — I5023 Acute on chronic systolic (congestive) heart failure: Secondary | ICD-10-CM | POA: Diagnosis not present

## 2018-04-27 DIAGNOSIS — E785 Hyperlipidemia, unspecified: Secondary | ICD-10-CM | POA: Diagnosis not present

## 2018-04-27 DIAGNOSIS — K219 Gastro-esophageal reflux disease without esophagitis: Secondary | ICD-10-CM | POA: Diagnosis not present

## 2018-04-27 DIAGNOSIS — F419 Anxiety disorder, unspecified: Secondary | ICD-10-CM | POA: Diagnosis not present

## 2018-04-27 DIAGNOSIS — M19012 Primary osteoarthritis, left shoulder: Secondary | ICD-10-CM | POA: Diagnosis not present

## 2018-04-27 DIAGNOSIS — Z7901 Long term (current) use of anticoagulants: Secondary | ICD-10-CM

## 2018-04-27 DIAGNOSIS — F329 Major depressive disorder, single episode, unspecified: Secondary | ICD-10-CM | POA: Diagnosis not present

## 2018-04-29 ENCOUNTER — Ambulatory Visit: Payer: Medicare Other | Admitting: Internal Medicine

## 2018-04-29 DIAGNOSIS — L905 Scar conditions and fibrosis of skin: Secondary | ICD-10-CM | POA: Diagnosis not present

## 2018-04-29 DIAGNOSIS — C4442 Squamous cell carcinoma of skin of scalp and neck: Secondary | ICD-10-CM | POA: Diagnosis not present

## 2018-05-01 ENCOUNTER — Telehealth: Payer: Self-pay | Admitting: Internal Medicine

## 2018-05-01 NOTE — Telephone Encounter (Signed)
Please advise 

## 2018-05-01 NOTE — Telephone Encounter (Signed)
Copied from [place] [id number]. Topic: Inquiry >> May 01, 2018  2:05 PM Oliver Pila B wrote: Reason for CRM: [facility] called to states pt's knee pain is usually rated b/w a 5-7 but has increased in pain; thru assesment therapist see's the pt has tylenol of 650mg  @ home or if the pcp see fit ; they can call in tramadol for the pt, contact [phone] to discuss if needed or call pt

## 2018-05-03 NOTE — Telephone Encounter (Signed)
Yes we can give him tramadol.  Does he need a knee injection with cortisone?  Thank you

## 2018-05-05 ENCOUNTER — Telehealth: Payer: Self-pay | Admitting: Cardiology

## 2018-05-05 ENCOUNTER — Other Ambulatory Visit: Payer: Self-pay | Admitting: Internal Medicine

## 2018-05-05 ENCOUNTER — Ambulatory Visit: Payer: Medicare Other | Admitting: Cardiology

## 2018-05-05 ENCOUNTER — Ambulatory Visit: Payer: Self-pay | Admitting: Internal Medicine

## 2018-05-05 NOTE — Telephone Encounter (Signed)
Pt would like tramadol sent in and does not want injection

## 2018-05-05 NOTE — Telephone Encounter (Signed)
Ubaldo Glassing, RN with [facility] called in from pt's home.    He is having difficulty with constipation.   He is taking Senna nightly and using Miralax daily without results.   He did have a small hard stool yesterday with pellets.   He is also on Linzess for IBS.   He goes back and forth between diarrhea and constipation but constipation has been his main problem lately.    His abd is more distended than normal but he has normal bowel sounds per Ubaldo Glassing, Therapist, sports. Requesting further directions from Dr. Alain Marion.   Reason for Disposition . Abdomen is more swollen than usual  Answer Assessment - Initial Assessment Questions 1. STOOL PATTERN OR FREQUENCY: "How often do you pass bowel movements (BMs)?"  (Normal range: tid to q 3 days)  "When was the last BM passed?"      Yesterday had a small BM after an enema.   RN said has great bowel sounds.   Takes Senna and Miralax.   Also on Linzess for IBS.    2. STRAINING: "Do you have to strain to have a BM?"      He tries not to strain but when he does nothing comes out. 3. RECTAL PAIN: "Does your rectum hurt when the stool comes out?" If so, ask: "Do you have hemorrhoids? How bad is the pain?"  (Scale 1-10; or mild, moderate, severe)     No.    4. STOOL COMPOSITION: "Are the stools hard?"      Yesterday's stool was small and hard like pellets. 5. BLOOD ON STOOLS: "Has there been any blood on the toilet tissue or on the surface of the BM?" If so, ask: "When was the last time?"      No 6. CHRONIC CONSTIPATION: "Is this a new problem for you?"  If no, ask: How long have you had this problem?" (days, weeks, months)      Has IBS.  Constipation lately is main problem. 7. CHANGES IN DIET: "Have there been any recent changes in your diet?"      Eating fiber. 8. MEDICATIONS: "Have you been taking any new medications?"     No changes 9. LAXATIVES: "Have you been using any laxatives or enemas?"  If yes, ask "What, how often, and when was the last time?"     See  above.   Taking Senna every night and taking Miralax daily.   Takes 17 Gram dose of Mirlax.    Tried prune juice with butter in it.  Supposed to help but nothing happened. 10. CAUSE: "What do you think is causing the constipation?"        Taking Linzess.    Not taken anything for pain.    Only Xanax at bedtime which is not new for him. 11. OTHER SYMPTOMS: "Do you have any other symptoms?" (e.g., abdominal pain, fever, vomiting)       No   Abd is distended but with good bowel sounds.  12. PREGNANCY: "Is there any chance you are pregnant?" "When was your last menstrual period?"       N/A  Protocols used: CONSTIPATION-A-AH

## 2018-05-05 NOTE — Telephone Encounter (Signed)
New Message   [name] [name]  States that the pt has been making progress in the home health physical therapy and in current visit would recommend conitnuing frequency 1x a week for 4 weeks. Also states that the pt would benefit for a home health aid for bathing 2x a week for 4 weeks and a  referral for medical social worker for long term assistance plan

## 2018-05-05 NOTE — Telephone Encounter (Signed)
Left message to contact PCP who Clair Gulling received original order from. If any problems, call back

## 2018-05-05 NOTE — Telephone Encounter (Signed)
Discussed message with Dr Percival Spanish and pt may take a extra 20 mg of Furosemide for 2-3 days Pt notified of new directions ./cy

## 2018-05-05 NOTE — Telephone Encounter (Signed)
Ubaldo Glassing, RN can be reached at [phone].  ([place])

## 2018-05-05 NOTE — Telephone Encounter (Signed)
Me     Spoke with [name] [name] and he stated he has tried to reach out to Dr [employer] office via phone and fax with no response. Advised would forward message to his office

## 2018-05-05 NOTE — Telephone Encounter (Signed)
New message    Got message about orders from PCP , unable to get response from PCP that is why he is asking Dr Percival Spanish

## 2018-05-05 NOTE — Telephone Encounter (Signed)
Please advise about constipation

## 2018-05-05 NOTE — Telephone Encounter (Signed)
Spoke with Clair Gulling and he stated he has tried to reach out to Dr [employer] office via phone and fax with no response. Advised would forward message to his office

## 2018-05-05 NOTE — Telephone Encounter (Signed)
New message    Pt c/o swelling: STAT is pt has developed SOB within 24 hours  1) How much weight have you gained and in what time span? 3lb weight gain  2) If swelling, where is the swelling located? Ankles and waist 107 in abdomin to 113 cm  Ankles 22 last week to 24 today   3) Are you currently taking a fluid pill?  Yes   4) Are you currently SOB? Only with exertion   Do you have a log of your daily weights (if so, list)? 6/13 167.8 6/14 167.4 6/15 169. 6/16 169.9 6/17 170.2  5) Have you gained 3 pounds in a day or 5 pounds in a week? Yes   6) Have you traveled recently? no

## 2018-05-06 MED ORDER — TRAMADOL HCL 50 MG PO TABS: 25.0000 mg | ORAL_TABLET | Freq: Four times a day (QID) | ORAL | 0 refills | Status: DC | PRN

## 2018-05-06 NOTE — Telephone Encounter (Signed)
[name] [name] with [facility] notified

## 2018-05-06 NOTE — Telephone Encounter (Signed)
Ok Thx 

## 2018-05-06 NOTE — Telephone Encounter (Signed)
Linzess Rx prn OTC Miralax prn OTC Senakot S 1-2 tab prn Thx

## 2018-05-08 ENCOUNTER — Encounter: Payer: Self-pay | Admitting: Cardiology

## 2018-05-08 ENCOUNTER — Ambulatory Visit: Payer: Medicare Other | Admitting: Cardiology

## 2018-05-08 ENCOUNTER — Telehealth: Payer: Self-pay | Admitting: Cardiology

## 2018-05-08 VITALS — BP 130/82 | HR 147 | Ht 67.0 in | Wt 178.0 lb

## 2018-05-08 DIAGNOSIS — I10 Essential (primary) hypertension: Secondary | ICD-10-CM

## 2018-05-08 MED ORDER — DILTIAZEM HCL ER COATED BEADS 120 MG PO CP24: 120.0000 mg | ORAL_CAPSULE | Freq: Every day | ORAL | 0 refills | Status: DC

## 2018-05-08 MED ORDER — FUROSEMIDE 40 MG PO TABS: 40.0000 mg | ORAL_TABLET | Freq: Two times a day (BID) | ORAL | 0 refills | Status: DC

## 2018-05-08 NOTE — Telephone Encounter (Signed)
New Message:       [place] needing to speak with RN about a possible reaction b/w two medications sent in for the pt.

## 2018-05-08 NOTE — Telephone Encounter (Signed)
The pharmacist called to verify that the patient should be taking Diltiazem 120 mg with the Amiodarone. This has been verified as correct. She verbalized her understanding.

## 2018-05-08 NOTE — Progress Notes (Signed)
05/08/2018 [name] [name]   [birth year]  [id number]  Primary Physician Plotnikov, Evie Lacks, MD Primary Cardiologist: Dr Percival Spanish  HPI:  [name] [name] 84 y/o male, [employer] in [employer], with a history of PAF with a tendency to bradycardia when in NSR. He had been controlled with Amiodarone 3 times a week. This was cut back to 100 mg thre times a week in Feb 2019. He presented to the ED 03/30/18 with dyspnea, AKI, AF with RVR and a weight of 192 lbs, almost 20 lbs over his baseline. He was admitted and placed on Diltiazem for rate control. He was re loaded with Amiodarone. He underwent TEE CV to NSR. When discharged home his weight was down to 173 lbs. He is seen in the office 04/14/18 for TOC follow up. He was doing well, still weak. His weight was 169.8 lbs. He was taking Amiodarone 200 mg MWF though he was discharged on Amiodarone 200 mg daily. In the office 04/14/18 he was back in A flutter. I increased his Amiodarone to 200 mg daily and I'm seeing him back today for follow up. His neighbor accompanied him. The pt says he feels generally weak. He has LE edema. His weight is 178 lbs, HR 140.    Current Outpatient Medications  Medication Sig Dispense Refill  . ALPRAZolam (XANAX) 1 MG tablet Take 1 tablet (1 mg total) by mouth at bedtime. 90 tablet 1  . amiodarone (PACERONE) 200 MG tablet TAKE 1 TABLET ONCE DAILY. 30 tablet 3  . apixaban (ELIQUIS) 2.5 MG TABS tablet Take 1 tablet (2.5 mg total) by mouth 2 (two) times daily. 60 tablet 11  . benzonatate (TESSALON) 100 MG capsule Take 1 capsule (100 mg total) by mouth 3 (three) times daily as needed. (Patient taking differently: Take 100 mg by mouth 3 (three) times daily as needed for cough. ) 20 capsule 0  . finasteride (PROSCAR) 5 MG tablet Take 1 tablet (5 mg total) by mouth daily. 90 tablet 3  . furosemide (LASIX) 40 MG tablet Take 1 tablet (40 mg total) by mouth daily. 60 tablet 0  . linaclotide (LINZESS) 290 MCG CAPS capsule Take 1 capsule (290 mcg total)  by mouth daily before breakfast. (Patient taking differently: Take 28 mcg by mouth daily as needed (constipation). ) 28 capsule 0  . polyethylene glycol (MIRALAX / GLYCOLAX) packet Take 17 g by mouth at bedtime.    . pravastatin (PRAVACHOL) 20 MG tablet TAKE 1 TABLET EACH DAY. 30 tablet 11  . ranitidine (ZANTAC) 150 MG tablet Take 1 tablet (150 mg total) by mouth 2 (two) times daily as needed for heartburn. 180 tablet 3  . senna (SENOKOT) 8.6 MG TABS tablet Take 1-2 tablets (8.6-17.2 mg total) by mouth at bedtime. 120 each 11  . traMADol (ULTRAM) 50 MG tablet Take 0.5-1 tablets (25-50 mg total) by mouth every 6 (six) hours as needed for severe pain. 20 tablet 0  . VENTOLIN HFA 108 (90 Base) MCG/ACT inhaler Inhale 2 puffs into the lungs every 6 (six) hours as needed for wheezing or shortness of breath.   0   No current facility-administered medications for this visit.     Allergies  Allergen Reactions  . Lisinopril Cough    Past Medical History:  Diagnosis Date  . Anxiety   . Arthritis    "shoulders" (05/28/2016)  . Cardiomyopathy   . Coronary artery disease   . Depression    hx (05/28/2016)  . Hemorrhoids   . Hyperlipidemia   .  Hypertension   . Skipped heart beats     Social History   Socioeconomic History  . Marital status: Widowed    Spouse name: Not on file  . Number of children: 1  . Years of education: Not on file  . Highest education level: Not on file  Occupational History  . Occupation: Retired  Scientific laboratory technician  . Financial resource strain: Not on file  . Food insecurity:    Worry: Not on file    Inability: Not on file  . Transportation needs:    Medical: Not on file    Non-medical: Not on file  Tobacco Use  . Smoking status: Never Smoker  . Smokeless tobacco: Never Used  Substance and Sexual Activity  . Alcohol use: No  . Drug use: No  . Sexual activity: Yes  Lifestyle  . Physical activity:    Days per week: Not on file    Minutes per session: Not on  file  . Stress: Not on file  Relationships  . Social connections:    Talks on phone: Not on file    Gets together: Not on file    Attends religious service: Not on file    Active member of club or organization: Not on file    Attends meetings of clubs or organizations: Not on file    Relationship status: Not on file  . Intimate partner violence:    Fear of current or ex partner: Not on file    Emotionally abused: Not on file    Physically abused: Not on file    Forced sexual activity: Not on file  Other Topics Concern  . Not on file  Social History Narrative  . Not on file     Family History  Problem Relation Age of Onset  . Heart disease Brother      Review of Systems: General: negative for chills, fever, night sweats or weight changes.  Cardiovascular: negative for chest pain, dyspnea on exertion, edema, orthopnea, palpitations, paroxysmal nocturnal dyspnea or shortness of breath Dermatological: negative for rash Respiratory: negative for cough or wheezing Urologic: negative for hematuria Abdominal: negative for nausea, vomiting, diarrhea, bright red blood per rectum, melena, or hematemesis Neurologic: negative for visual changes, syncope, or dizziness All other systems reviewed and are otherwise negative except as noted above.    Blood pressure 130/82, pulse (!) 147, height 5\' 7"  (1.702 m), weight 178 lb (80.7 kg).  General appearance: alert, cooperative, appears stated age and no distress Neck: no JVD Lungs: clear to auscultation bilaterally Heart: irregularly irregular rhythm Extremities: 1+ pre tibial pitting edema Skin: Skin color, texture, turgor normal. No rashes or lesions Neurologic: Grossly normal  EKG AF with RBBB- HR 147  ASSESSMENT AND PLAN:   Acute on chronic combined systolic and diastolic CHF (congestive heart failure) ([name] [name]) Pt admitted 03/30/18-04/04/18 with CHF, AF with RVR, and AKI. DC weight (173 lbs), now 178 lbs with LE edema  Atrial  fibrillation with RVR (HCC) Pt's Amiodarone decreased to 100 mg 3 times a week in Feb 2019- He presented with AF with RVR May 2019- reloaded with Amiodarone and DCCV. He was back in AF when I saw him 5/28- Amiodarone increased to 200 mg daily.  He has had a tendency to bradycardia when in NSR, he is not on a beta blocker.   Congestive dilated cardiomyopathy (HCC) EF 20% by TEE May 2019 when he was in AF  CKD (chronic kidney disease) stage 3, GFR 30-59 ml/min (HCC) GFR  28- SCr 1.83 -May 2019  Chronic anticoagulation Low dose Eliquis    PLAN  I strongly encouraged admission for diuresis and rate control but he would like to avoid hospitalization. I suggested we increase his Lasix to 40 mg BID and his Amiodarone to 400 mg QD, and Diltiazem 120 mg daily. I'll see him back Monday.   Dr Percival Spanish was in the office today and came by to see Mr Arcia. He discussed Hospice care with the patient but at this time Mr abalos declines this.   Kerin Ransom PA-C 05/08/2018 10:53 AM

## 2018-05-08 NOTE — Patient Instructions (Addendum)
Medication Instructions: INCREASE the Amiodarone to 400 mg daily, JUST FOR THIS WEEKEND. START Diltiazem 120 mg daily INCREASE the Furosemide to 40 mg twice daily.  If you need a refill on your cardiac medications before your next appointment, please call your pharmacy.   Follow-Up: Your physician wants you to follow-up on Monday 6/24 at 11:30 with Kerin Ransom, PA.  Thank you for choosing Heartcare at [facility]!!

## 2018-05-11 ENCOUNTER — Encounter: Payer: Self-pay | Admitting: Cardiology

## 2018-05-11 ENCOUNTER — Ambulatory Visit: Payer: Medicare Other | Admitting: Cardiology

## 2018-05-11 VITALS — BP 115/82 | HR 114 | Ht 67.0 in | Wt 177.6 lb

## 2018-05-11 DIAGNOSIS — N183 Chronic kidney disease, stage 3 unspecified: Secondary | ICD-10-CM

## 2018-05-11 DIAGNOSIS — I480 Paroxysmal atrial fibrillation: Secondary | ICD-10-CM | POA: Diagnosis not present

## 2018-05-11 DIAGNOSIS — Z7901 Long term (current) use of anticoagulants: Secondary | ICD-10-CM

## 2018-05-11 DIAGNOSIS — I5043 Acute on chronic combined systolic (congestive) and diastolic (congestive) heart failure: Secondary | ICD-10-CM | POA: Diagnosis not present

## 2018-05-11 DIAGNOSIS — I4891 Unspecified atrial fibrillation: Secondary | ICD-10-CM

## 2018-05-11 MED ORDER — FUROSEMIDE 40 MG PO TABS: 60.0000 mg | ORAL_TABLET | Freq: Every day | ORAL | 0 refills | Status: DC

## 2018-05-11 NOTE — Patient Instructions (Addendum)
Medication Instructions: TAKE Amiodarone 200 mg daily DECREASE the furosemide to 60 mg every morning.  If you need a refill on your cardiac medications before your next appointment, please call your pharmacy.   Follow-Up: Your physician wants you to follow-up in 4 weeks with Kerin Ransom, PA on the same day as Dr. Percival Spanish is in the office.  Thank you for choosing Heartcare at [facility]!!

## 2018-05-11 NOTE — Progress Notes (Signed)
05/11/2018 [name] [name]   [birth year]  [id number]  Primary Physician Plotnikov, Evie Lacks, MD Primary Cardiologist: Dr Percival Spanish  HPI:  Pleasant 84 y/o male, [employer] in [employer], with a history of PAF with a tendency to bradycardia when in NSR. He had been controlled with Amiodarone 3 times a week. This was cut back to 100 mg thre times a week in Feb 2019. He presented to the ED 03/30/18 with dyspnea, AKI, AF with RVR and a weight of 192 lbs, almost 20 lbs over his baseline. He was admitted and placed on Diltiazem for rate control. He was re loaded with Amiodarone and underwent TEE CV to NSR. When discharged home his weight was down to 173 lbs. He is seen in the office 04/14/18 for TOC follow up. He was doing well, still weak. His weight was 169.8 lbs. He was taking Amiodarone 200 mg MWF though he was discharged on Amiodarone 200 mg daily. In the office 04/14/18 he was back in A flutter. I increased his Amiodarone to 200 mg daily.  I saw him back in follow up 05/08/18. He was in rapid AF with VR 140. His weight was 178 lbs and he had LE edema on exam. I though he should be admitted for IV diuresis and rate control but the patient was reluctant. Dr Percival Spanish was in the office and came in and saw him as well. The plans are to avoid sending him to the hospital if possible. Dr Percival Spanish did discuss Hospice but the patient declined. I increased his Lasix to 40 mg BID, his Amiodarone to 400 mg a day, and added Diltiazem 120 mg. He is back today for follow up. He is doing better, less edema, no significant increase in dyspnea. His HR has been running 90-110. He is still in AF.    Current Outpatient Medications  Medication Sig Dispense Refill  . ALPRAZolam (XANAX) 1 MG tablet Take 1 tablet (1 mg total) by mouth at bedtime. 90 tablet 1  . amiodarone (PACERONE) 200 MG tablet TAKE 1 TABLET ONCE DAILY. 30 tablet 3  . apixaban (ELIQUIS) 2.5 MG TABS tablet Take 1 tablet (2.5 mg total) by mouth 2 (two) times daily. 60  tablet 11  . benzonatate (TESSALON) 100 MG capsule Take 1 capsule (100 mg total) by mouth 3 (three) times daily as needed. (Patient taking differently: Take 100 mg by mouth 3 (three) times daily as needed for cough. ) 20 capsule 0  . diltiazem (CARDIZEM CD) 120 MG 24 hr capsule Take 1 capsule (120 mg total) by mouth daily. 90 capsule 0  . finasteride (PROSCAR) 5 MG tablet Take 1 tablet (5 mg total) by mouth daily. 90 tablet 3  . furosemide (LASIX) 40 MG tablet Take 1 tablet (40 mg total) by mouth 2 (two) times daily. 60 tablet 0  . linaclotide (LINZESS) 290 MCG CAPS capsule Take 1 capsule (290 mcg total) by mouth daily before breakfast. (Patient taking differently: Take 28 mcg by mouth daily as needed (constipation). ) 28 capsule 0  . polyethylene glycol (MIRALAX / GLYCOLAX) packet Take 17 g by mouth at bedtime.    . pravastatin (PRAVACHOL) 20 MG tablet TAKE 1 TABLET EACH DAY. 30 tablet 11  . ranitidine (ZANTAC) 150 MG tablet Take 1 tablet (150 mg total) by mouth 2 (two) times daily as needed for heartburn. 180 tablet 3  . senna (SENOKOT) 8.6 MG TABS tablet Take 1-2 tablets (8.6-17.2 mg total) by mouth at bedtime. 120 each 11  .  traMADol (ULTRAM) 50 MG tablet Take 0.5-1 tablets (25-50 mg total) by mouth every 6 (six) hours as needed for severe pain. 20 tablet 0  . VENTOLIN HFA 108 (90 Base) MCG/ACT inhaler Inhale 2 puffs into the lungs every 6 (six) hours as needed for wheezing or shortness of breath.   0   No current facility-administered medications for this visit.     Allergies  Allergen Reactions  . Lisinopril Cough    Past Medical History:  Diagnosis Date  . Anxiety   . Arthritis    "shoulders" (05/28/2016)  . Cardiomyopathy   . Coronary artery disease   . Depression    hx (05/28/2016)  . Hemorrhoids   . Hyperlipidemia   . Hypertension   . Skipped heart beats     Social History   Socioeconomic History  . Marital status: Widowed    Spouse name: Not on file  . Number of  children: 1  . Years of education: Not on file  . Highest education level: Not on file  Occupational History  . Occupation: Retired  Scientific laboratory technician  . Financial resource strain: Not on file  . Food insecurity:    Worry: Not on file    Inability: Not on file  . Transportation needs:    Medical: Not on file    Non-medical: Not on file  Tobacco Use  . Smoking status: Never Smoker  . Smokeless tobacco: Never Used  Substance and Sexual Activity  . Alcohol use: No  . Drug use: No  . Sexual activity: Yes  Lifestyle  . Physical activity:    Days per week: Not on file    Minutes per session: Not on file  . Stress: Not on file  Relationships  . Social connections:    Talks on phone: Not on file    Gets together: Not on file    Attends religious service: Not on file    Active member of club or organization: Not on file    Attends meetings of clubs or organizations: Not on file    Relationship status: Not on file  . Intimate partner violence:    Fear of current or ex partner: Not on file    Emotionally abused: Not on file    Physically abused: Not on file    Forced sexual activity: Not on file  Other Topics Concern  . Not on file  Social History Narrative  . Not on file     Family History  Problem Relation Age of Onset  . Heart disease Brother      Review of Systems: General: negative for chills, fever, night sweats or weight changes.  Cardiovascular: negative for chest pain, dyspnea on exertion, edema, orthopnea, palpitations, paroxysmal nocturnal dyspnea or shortness of breath Dermatological: negative for rash Respiratory: negative for cough or wheezing Urologic: negative for hematuria Abdominal: negative for nausea, vomiting, diarrhea, bright red blood per rectum, melena, or hematemesis Neurologic: negative for visual changes, syncope, or dizziness All other systems reviewed and are otherwise negative except as noted above.    Blood pressure 115/82, pulse (!) 114,  height 5\' 7"  (1.702 m), weight 177 lb 9.6 oz (80.6 kg).  General appearance: alert, cooperative and no distress Lungs: decreased 1/3 on Lt base Heart: irregularly irregular rhythm Extremities: trace LE edema Skin: Skin color, texture, turgor normal. No rashes or lesions Neurologic: Grossly normal  EKG AF, LBBB 112  ASSESSMENT AND PLAN:   Acute on chronic combined systolic and diastolic CHF (congestive  heart failure) ([place]) Pt admitted 03/30/18-04/04/18 with CHF, AF with RVR, and AKI. DC weight (173 lbs), recurrent CHF and AF with RVR 05/08/18- medications adjusted  Atrial fibrillation with RVR (HCC) Pt's Amiodarone decreased to 100 mg 3 times a week in Feb 2019- He presented with AF with RVR May 2019- reloaded with Amiodarone and DCCV. He was back in AF when I saw him 5/28- Amiodarone increased to 200 mg daily.  He has had a tendency to bradycardia when in NSR, he is not on a beta blocker.   Congestive dilated cardiomyopathy (HCC) EF 20% by TEE May 2019 when he was in AF  CKD (chronic kidney disease) stage 3, GFR 30-59 ml/min (HCC) GFR 28- SCr 1.83 -May 2019  Chronic anticoagulation Low dose Eliquis    PLAN  Decrease Amiodarone to 200 mg daily. Continue Diltiazem 120 mg. Decrease Lasix to 60 mg Q AM (c/o of nocturia on BID Lasix). He'll need close f/u to keep him out of the hospital. Not sure there is any point in attempting another cardioversion- will review with Dr Percival Spanish.   [name] Ransom PA-C 05/11/2018 11:50 AM

## 2018-05-12 ENCOUNTER — Other Ambulatory Visit: Payer: Self-pay | Admitting: Cardiology

## 2018-05-13 ENCOUNTER — Other Ambulatory Visit: Payer: Self-pay

## 2018-05-19 ENCOUNTER — Telehealth: Payer: Self-pay | Admitting: Internal Medicine

## 2018-05-19 NOTE — Telephone Encounter (Signed)
Copied from [place] [id number]. Topic: Quick Communication - See Telephone Encounter >> May 19, 2018 12:42 PM Gardiner Ramus wrote: CRM for notification. See Telephone encounter for: 05/19/18. Clair Gulling from [facility] called to inform that pt has not had a BM since 05/16/18. PT is not in distress. Taking linzess, Senokot, and miralax as directed. Should pt do anything different. Pt also has plus 3 aderma on ankle. Daily log weight no significant changes Cb# [phone] please advise

## 2018-05-20 NOTE — Telephone Encounter (Signed)
pls add OTC MOM Thx

## 2018-05-20 NOTE — Telephone Encounter (Signed)
Jim @ [facility] and patient informed of below.

## 2018-05-22 ENCOUNTER — Telehealth: Payer: Self-pay | Admitting: Internal Medicine

## 2018-05-22 ENCOUNTER — Telehealth: Payer: Self-pay | Admitting: Cardiology

## 2018-05-22 NOTE — Telephone Encounter (Signed)
New message:       Pt c/o medication issue:  1. Name of Medication: furosemide (LASIX) 40 MG tablet  2. How are you currently taking this medication (dosage and times per day)? TAKE 1 TABLET BY MOUTH TWICE DAILY.  3. Are you having a reaction (difficulty breathing--STAT)? No  4. What is your medication issue? Pt states Dr. Alain Marion told him to increase his dosage of this medication due to his swollen ankles. Pt states he wants to make sure that this is okay with hochrein.

## 2018-05-22 NOTE — Telephone Encounter (Signed)
Copied from [place] [id number]. Topic: Quick Communication - See Telephone Encounter >> May 22, 2018 12:46 PM Mylinda Latina, NT wrote: CRM for notification. See Telephone encounter for: 05/22/18. Santiago Glad calling from [facility] states the patient called her today and expressed he was having increased swelling in his legs and ankles. He took an extra lasix on his [place]. Patient states his weight is 170 today which is down 4 lbs since yesterday. His BP  Is 102/67. Santiago Glad is wondering if there is anything else that can be done. She did tell the patient to evaluate his leg. Please call Santiago Glad CB#  [phone].

## 2018-05-22 NOTE — Telephone Encounter (Signed)
Spoke to patient . Patient wanted to know if it would be okay to follow Dr Alain Marion instructions concerning  to double furosemide due to swelling. RN informed patient to follow instruction he was given by primary office.  Keep a record of weight and take 2nd dose by 2 to 3 pm daily. Contact primary on Monday as directed..  Patient verbalized understanding.

## 2018-05-22 NOTE — Telephone Encounter (Signed)
Called pt and he stated that he did take his 2 40mg  of lasix yesterday morning and then an extra 40 mg lasix yesterday evening.   Pt states that his legs and ankle are looking better than they were yesterday.   Pt states that his vitals are as follows:   170lb - down 4 pounds.   102/67 BP  Pt stated that he does not have CP, SOB, CP or Wheezing.   Spoke to Dr. Alain Marion - Pt may take a double dose of the lasix through the weekend and let us know how it looks on Monday. Pt informed of same stated understanding. Pt informed of ED precautions and understands to go to the ED if he experiences any SOB, CP, DOE or Wheezing.

## 2018-05-25 DIAGNOSIS — N401 Enlarged prostate with lower urinary tract symptoms: Secondary | ICD-10-CM | POA: Diagnosis not present

## 2018-05-25 DIAGNOSIS — N528 Other male erectile dysfunction: Secondary | ICD-10-CM | POA: Diagnosis not present

## 2018-05-25 DIAGNOSIS — R351 Nocturia: Secondary | ICD-10-CM | POA: Diagnosis not present

## 2018-05-25 DIAGNOSIS — N138 Other obstructive and reflux uropathy: Secondary | ICD-10-CM | POA: Diagnosis not present

## 2018-05-27 ENCOUNTER — Telehealth: Payer: Self-pay | Admitting: Internal Medicine

## 2018-05-27 NOTE — Telephone Encounter (Signed)
Copied from [place] [id number]. Topic: Inquiry >> May 27, 2018  4:52 PM Oliver Pila B wrote: Reason for CRM: [facility]) called to state pt is aware of hernia for several weeks, stabbing pain, 3 days no bowel movement (history of irregularity), size of hernia is 2in x 3in depth 1/2in, contact [phone]

## 2018-05-27 NOTE — Telephone Encounter (Signed)
I have spoken with Plotnikov in regard and was able to reach [place] still at [facility].  Pain is only intermediate with patient expierencing no pain currently.   Dr. Alain Marion  has informed patient to use milk of magnesia and drink plenty of water.  Patient stated he did not want to drink a lot of water and had taken milk of magnesia at 1pm.  Dr. Alain Marion then advised for patient to try either fleet enema or dulcolax.  Patient was also informed to go to the ED if sharp pain returned.  I have also made an appointment for patient to follow up with Dr. Alain Marion on Friday 7/12.

## 2018-05-28 NOTE — Telephone Encounter (Signed)
noted 

## 2018-05-29 ENCOUNTER — Ambulatory Visit: Payer: Medicare Other | Admitting: Internal Medicine

## 2018-05-29 ENCOUNTER — Encounter: Payer: Self-pay | Admitting: Internal Medicine

## 2018-05-29 DIAGNOSIS — K4091 Unilateral inguinal hernia, without obstruction or gangrene, recurrent: Secondary | ICD-10-CM

## 2018-05-29 DIAGNOSIS — K4090 Unilateral inguinal hernia, without obstruction or gangrene, not specified as recurrent: Secondary | ICD-10-CM | POA: Insufficient documentation

## 2018-05-29 DIAGNOSIS — K5900 Constipation, unspecified: Secondary | ICD-10-CM | POA: Diagnosis not present

## 2018-05-29 MED ORDER — PEG 3350-KCL-NA BICARB-NACL 420 G PO SOLR: ORAL | 3 refills | Status: DC

## 2018-05-29 NOTE — Patient Instructions (Addendum)
Use Miralax as needed, Linzess daily, Senakot S as needed, Fleet enema as needed,  Nulytely - drink 1 glass 2-4 times a day as needed       Inguinal Hernia, Adult An inguinal hernia is when fat or the intestines push through the area where the leg meets the lower belly (groin) and make a rounded lump (bulge). This condition happens over time. There are three types of inguinal hernias. These types include:  Hernias that can be pushed back into the belly (are reducible).  Hernias that cannot be pushed back into the belly (are incarcerated).  Hernias that cannot be pushed back into the belly and lose their blood supply (get strangulated). This type needs emergency surgery.  Follow these instructions at home: Lifestyle  Drink enough fluid to keep your urine (pee) clear or pale yellow.  Eat plenty of fruits, vegetables, and whole grains. These have a lot of fiber. Talk with your doctor if you have questions.  Avoid lifting heavy objects.  Avoid standing for long periods of time.  Do not use tobacco products. These include cigarettes, chewing tobacco, or e-cigarettes. If you need help quitting, ask your doctor.  Try to stay at a healthy weight. General instructions  Do not try to force the hernia back in.  Watch your hernia for any changes in color or size. Let your doctor know if there are any changes.  Take over-the-counter and prescription medicines only as told by your doctor.  Keep all follow-up visits as told by your doctor. This is important. Contact a doctor if:  You have a fever.  You have new symptoms.  Your symptoms get worse. Get help right away if:  The area where the legs meets the lower belly has: ? Pain that gets worse suddenly. ? A bulge that gets bigger suddenly and does not go down. ? A bulge that turns red or purple. ? A bulge that is painful to the touch.  You are a man and your scrotum: ? Suddenly feels painful. ? Suddenly changes in  size.  You feel sick to your stomach (nauseous) and this feeling does not go away.  You throw up (vomit) and this keeps happening.  You feel your heart beating a lot more quickly than normal.  You cannot poop (have a bowel movement) or pass gas. This information is not intended to replace advice given to you by your health care provider. Make sure you discuss any questions you have with your health care provider. Document Released: 12/05/2006 Document Revised: 04/11/2016 Document Reviewed: 09/14/2014 Elsevier Interactive Patient Education  2018 [employer].

## 2018-05-29 NOTE — Assessment & Plan Note (Signed)
Use Miralax as needed, Linzess daily, Senakot S as needed, Fleet enema as needed,  Nulytely - drink 1 glass 2-4 times a day as needed

## 2018-05-29 NOTE — Progress Notes (Signed)
Subjective:  Patient ID: [name] [name], male    DOB: [birth year]  Age: 84 y.o. MRN: [id number]  CC: No chief complaint on file.   HPI [name] [name] presents for severe constipation and abd pain - chronic. Has small stools, hard. C/o hernia  Outpatient Medications Prior to Visit  Medication Sig Dispense Refill  . ALPRAZolam (XANAX) 1 MG tablet Take 1 tablet (1 mg total) by mouth at bedtime. 90 tablet 1  . amiodarone (PACERONE) 200 MG tablet TAKE 1 TABLET ONCE DAILY. 30 tablet 3  . apixaban (ELIQUIS) 2.5 MG TABS tablet Take 1 tablet (2.5 mg total) by mouth 2 (two) times daily. 60 tablet 11  . benzonatate (TESSALON) 100 MG capsule Take 1 capsule (100 mg total) by mouth 3 (three) times daily as needed. (Patient taking differently: Take 100 mg by mouth 3 (three) times daily as needed for cough. ) 20 capsule 0  . diltiazem (CARDIZEM CD) 120 MG 24 hr capsule Take 1 capsule (120 mg total) by mouth daily. 90 capsule 0  . finasteride (PROSCAR) 5 MG tablet Take 1 tablet (5 mg total) by mouth daily. 90 tablet 3  . furosemide (LASIX) 40 MG tablet TAKE 1 TABLET BY MOUTH TWICE DAILY. 60 tablet 7  . linaclotide (LINZESS) 290 MCG CAPS capsule Take 1 capsule (290 mcg total) by mouth daily before breakfast. (Patient taking differently: Take 28 mcg by mouth daily as needed (constipation). ) 28 capsule 0  . polyethylene glycol (MIRALAX / GLYCOLAX) packet Take 17 g by mouth at bedtime.    . pravastatin (PRAVACHOL) 20 MG tablet TAKE 1 TABLET EACH DAY. 30 tablet 11  . ranitidine (ZANTAC) 150 MG tablet Take 1 tablet (150 mg total) by mouth 2 (two) times daily as needed for heartburn. 180 tablet 3  . senna (SENOKOT) 8.6 MG TABS tablet Take 1-2 tablets (8.6-17.2 mg total) by mouth at bedtime. 120 each 11  . traMADol (ULTRAM) 50 MG tablet Take 0.5-1 tablets (25-50 mg total) by mouth every 6 (six) hours as needed for severe pain. 20 tablet 0  . VENTOLIN HFA 108 (90 Base) MCG/ACT inhaler Inhale 2 puffs into the lungs every  6 (six) hours as needed for wheezing or shortness of breath.   0   No facility-administered medications prior to visit.     ROS: Review of Systems  Constitutional: Positive for fatigue. Negative for appetite change and unexpected weight change.  HENT: Negative for congestion, nosebleeds, sneezing, sore throat and trouble swallowing.   Eyes: Negative for itching and visual disturbance.  Respiratory: Negative for cough.   Cardiovascular: Negative for chest pain, palpitations and leg swelling.  Gastrointestinal: Positive for abdominal distention and constipation. Negative for blood in stool, diarrhea and nausea.  Genitourinary: Negative for frequency and hematuria.  Musculoskeletal: Positive for gait problem. Negative for back pain, joint swelling and neck pain.  Skin: Negative for rash.  Neurological: Negative for dizziness, tremors, speech difficulty and weakness.  Psychiatric/Behavioral: Negative for agitation, dysphoric mood, sleep disturbance and suicidal ideas. The patient is not nervous/anxious.     Objective:  BP 114/78 (BP Location: Left Arm, Patient Position: Sitting, Cuff Size: Large)   Pulse (!) 56   Temp 98 F (36.7 C) (Oral)   Ht 5\' 7"  (1.702 m)   Wt 173 lb (78.5 kg)   SpO2 94%   BMI 27.10 kg/m   BP Readings from Last 3 Encounters:  05/29/18 114/78  05/11/18 115/82  05/08/18 130/82    Wt Readings from Last  3 Encounters:  05/29/18 173 lb (78.5 kg)  05/11/18 177 lb 9.6 oz (80.6 kg)  05/08/18 178 lb (80.7 kg)    Physical Exam  Constitutional: He is oriented to person, place, and time. He appears well-developed. No distress.  NAD  HENT:  Mouth/Throat: Oropharynx is clear and moist.  Eyes: Pupils are equal, round, and reactive to light. Conjunctivae are normal.  Neck: Normal range of motion. No JVD present. No thyromegaly present.  Cardiovascular: Normal rate, regular rhythm, normal heart sounds and intact distal pulses. Exam reveals no gallop and no friction  rub.  No murmur heard. Pulmonary/Chest: Effort normal and breath sounds normal. No respiratory distress. He has no wheezes. He has no rales. He exhibits no tenderness.  Abdominal: Soft. Bowel sounds are normal. He exhibits mass. He exhibits no distension. There is no tenderness. There is no rebound and no guarding. A hernia is present.  Musculoskeletal: Normal range of motion. He exhibits no edema or tenderness.  Lymphadenopathy:    He has no cervical adenopathy.  Neurological: He is alert and oriented to person, place, and time. He has normal reflexes. No cranial nerve deficit. He exhibits normal muscle tone. He displays a negative Romberg sign. Coordination and gait normal.  Skin: Skin is warm and dry. No rash noted.  Psychiatric: He has a normal mood and affect. His behavior is normal. Judgment and thought content normal.  Cane abd NT, soft R inguinal hernia - orange size, NT  Lab Results  Component Value Date   WBC 6.3 04/02/2018   HGB 15.9 04/02/2018   HCT 49.2 04/02/2018   PLT 127 (L) 04/02/2018   GLUCOSE 88 04/14/2018   CHOL 112 03/30/2018   TRIG 80 03/30/2018   HDL 39 (L) 03/30/2018   LDLCALC 57 03/30/2018   ALT 14 (L) 04/04/2018   AST 14 (L) 04/04/2018   NA 144 04/14/2018   K 5.1 04/14/2018   CL 98 04/14/2018   CREATININE 1.83 (H) 04/14/2018   BUN 37 (H) 04/14/2018   CO2 27 04/14/2018   TSH 1.681 03/30/2018   PSA 0.24 03/08/2014   INR 1.32 03/30/2018   HGBA1C 5.7 (H) 03/31/2018    No results found.  Assessment & Plan:   There are no diagnoses linked to this encounter.   No orders of the defined types were placed in this encounter.    Follow-up: No follow-ups on file.  Walker Kehr, MD

## 2018-05-29 NOTE — Assessment & Plan Note (Signed)
R inguinal hernia - orange size, NT Will observe

## 2018-06-02 ENCOUNTER — Telehealth: Payer: Self-pay | Admitting: Internal Medicine

## 2018-06-02 DIAGNOSIS — M159 Polyosteoarthritis, unspecified: Secondary | ICD-10-CM

## 2018-06-02 DIAGNOSIS — W19XXXA Unspecified fall, initial encounter: Secondary | ICD-10-CM

## 2018-06-02 NOTE — Telephone Encounter (Signed)
Copied from [place] [id number]. Topic: General - Other >> Jun 02, 2018 12:50 PM Valla Leaver wrote: Reason for CRM: Abe People, RN, [place] with [facility] calling to relay to Tammy that the patient would like a script for a lift chair.

## 2018-06-02 NOTE — Telephone Encounter (Signed)
Copied from [place] [phone]. Topic: General - Other >> Jun 02, 2018 12:50 PM Valla Leaver wrote: Reason for CRM: Abe People, RN, [place] with [facility] calling to relay to Tammy that the patient would like a script for a lift chair.

## 2018-06-03 NOTE — Telephone Encounter (Signed)
Printed DME order for lift chair. [facility].Marland KitchenJohny [name]

## 2018-06-03 NOTE — Telephone Encounter (Signed)
Ok Thx 

## 2018-06-10 ENCOUNTER — Ambulatory Visit: Payer: Medicare Other | Admitting: Cardiology

## 2018-06-10 ENCOUNTER — Encounter: Payer: Self-pay | Admitting: Cardiology

## 2018-06-10 VITALS — BP 116/74 | HR 56 | Ht 66.0 in | Wt 172.0 lb

## 2018-06-10 DIAGNOSIS — I452 Bifascicular block: Secondary | ICD-10-CM | POA: Diagnosis not present

## 2018-06-10 DIAGNOSIS — I4891 Unspecified atrial fibrillation: Secondary | ICD-10-CM | POA: Diagnosis not present

## 2018-06-10 DIAGNOSIS — N183 Chronic kidney disease, stage 3 unspecified: Secondary | ICD-10-CM

## 2018-06-10 DIAGNOSIS — Z7901 Long term (current) use of anticoagulants: Secondary | ICD-10-CM

## 2018-06-10 DIAGNOSIS — I5043 Acute on chronic combined systolic (congestive) and diastolic (congestive) heart failure: Secondary | ICD-10-CM

## 2018-06-10 DIAGNOSIS — I420 Dilated cardiomyopathy: Secondary | ICD-10-CM

## 2018-06-10 MED ORDER — FUROSEMIDE 40 MG PO TABS: 80.0000 mg | ORAL_TABLET | ORAL | 11 refills | Status: DC

## 2018-06-10 NOTE — Progress Notes (Signed)
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 He had been controlled with Amiodarone 3 times a week. This was cut back to 100 mg three times a week in Feb 2019. He presented to the ED 03/30/18 with dyspnea, AKI, AF with RVR and a weight of 192 lbs, almost 20 lbs over his baseline. He was admitted then and placed on Diltiazem for rate control. He was re loaded with Amiodarone and underwent TEE CV to NSR. When dischargedhome his weight was down to 173 lbs. When seen in follow up he was back in AF and his weight was up. Diltiazem was added for rate control and his diuretic increased. The plan has been to try and avoid hospitalization. Dr Percival Spanish offered Hospice but the patient declined.   The pt is seen today in follow up. His main complaint is constipation. He has mild LE edema, no increased dyspnea. His weigh is steady at 172 lbs. He doesn't like taking the PM Lasix dose because he has urinary frequency with it. I had tried him on Lasix 60 mg but he developed LE edema and it was increased to 40 mg BID. On exam he remains in AF with variable VR- 50-90.   Current Outpatient Medications  Medication Sig Dispense Refill  . ALPRAZolam (XANAX) 1 MG tablet Take 1 tablet (1 mg total) by mouth at bedtime. 90 tablet 1  . amiodarone (PACERONE) 200 MG tablet TAKE 1 TABLET ONCE DAILY. 30 tablet 3  . apixaban (ELIQUIS) 2.5 MG TABS tablet Take 1 tablet (2.5 mg total) by mouth 2 (two) times daily. 60 tablet 11  . benzonatate (TESSALON) 100 MG capsule Take 1 capsule (100 mg total) by mouth 3 (three) times daily as needed. (Patient taking differently: Take 100 mg by mouth 3 (three) times daily as needed for cough. ) 20 capsule 0  . diltiazem (CARDIZEM CD) 120 MG 24 hr capsule Take 1 capsule (120 mg total) by mouth daily. 90 capsule  0  . finasteride (PROSCAR) 5 MG tablet Take 1 tablet (5 mg total) by mouth daily. 90 tablet 3  . furosemide (LASIX) 40 MG tablet TAKE 1 TABLET BY MOUTH TWICE DAILY. 60 tablet 7  . linaclotide (LINZESS) 290 MCG CAPS capsule Take 1 capsule (290 mcg total) by mouth daily before breakfast. (Patient taking differently: Take 28 mcg by mouth daily as needed (constipation). ) 28 capsule 0  . polyethylene glycol (MIRALAX / GLYCOLAX) packet Take 17 g by mouth at bedtime.    . polyethylene glycol-electrolytes (NULYTELY/GOLYTELY) 420 g solution Use 200 ml 4 times a day prn constipation in addition to Miralax (17 g bid) and Linzess 4000 mL 3  . pravastatin (PRAVACHOL) 20 MG tablet TAKE 1 TABLET EACH DAY. 30 tablet 11  . ranitidine (ZANTAC) 150 MG tablet Take 1 tablet (150 mg total) by mouth 2 (two) times daily as needed for heartburn. 180 tablet 3  . senna (SENOKOT) 8.6 MG TABS tablet Take 1-2 tablets (8.6-17.2 mg total) by mouth at bedtime. 120 each 11  . traMADol (ULTRAM) 50 MG tablet Take 0.5-1 tablets (25-50 mg total) by mouth every 6 (six) hours as needed for severe pain. 20 tablet 0  . VENTOLIN HFA 108 (90 Base) MCG/ACT inhaler Inhale 2 puffs into the lungs every 6 (six) hours as  needed for wheezing or shortness of breath.   0   No current facility-administered medications for this visit.     Allergies  Allergen Reactions  . Lisinopril Cough    Past Medical History:  Diagnosis Date  . Anxiety   . Arthritis    "shoulders" (05/28/2016)  . Cardiomyopathy   . Coronary artery disease   . Depression    hx (05/28/2016)  . Hemorrhoids   . Hyperlipidemia   . Hypertension   . Skipped heart beats     Social History   Socioeconomic History  . Marital status: Widowed    Spouse name: Not on file  . Number of children: 1  . Years of education: Not on file  . Highest education level: Not on file  Occupational History  . Occupation: Retired  Scientific laboratory technician  . Financial resource strain: Not on file    . Food insecurity:    Worry: Not on file    Inability: Not on file  . Transportation needs:    Medical: Not on file    Non-medical: Not on file  Tobacco Use  . Smoking status: Never Smoker  . Smokeless tobacco: Never Used  Substance and Sexual Activity  . Alcohol use: No  . Drug use: No  . Sexual activity: Yes  Lifestyle  . Physical activity:    Days per week: Not on file    Minutes per session: Not on file  . Stress: Not on file  Relationships  . Social connections:    Talks on phone: Not on file    Gets together: Not on file    Attends religious service: Not on file    Active member of club or organization: Not on file    Attends meetings of clubs or organizations: Not on file    Relationship status: Not on file  . Intimate partner violence:    Fear of current or ex partner: Not on file    Emotionally abused: Not on file    Physically abused: Not on file    Forced sexual activity: Not on file  Other Topics Concern  . Not on file  Social History Narrative  . Not on file     Family History  Problem Relation Age of Onset  . Heart disease Brother      Review of Systems: General: negative for chills, fever, night sweats or weight changes.  Cardiovascular: negative for chest pain, dyspnea on exertion, edema, orthopnea, palpitations, paroxysmal nocturnal dyspnea or shortness of breath Dermatological: negative for rash Respiratory: negative for cough or wheezing Urologic: negative for hematuria Abdominal: negative for nausea, vomiting, diarrhea, bright red blood per rectum, melena, or hematemesis Neurologic: negative for visual changes, syncope, or dizziness All other systems reviewed and are otherwise negative except as noted above.    Height 5\' 6"  (1.676 m), weight 172 lb (78 kg).  General appearance: alert, cooperative, appears stated age and no distress Lungs: clear to auscultation bilaterally Heart: irregularly irregular rhythm Extremities: 1+  edema Neurologic: Grossly normal   ASSESSMENT AND PLAN:    Acute on chronic combined systolic and diastolic CHF (congestive heart failure) ([place]) He appears stable, some of his LE edema may be from Diltiazem as well as his constipation.   Atrial fibrillation with RVR (HCC) Pt's Amiodarone decreased to 100 mg 3 times a week in Feb 2019- He presented with AF with RVR May 2019- reloaded with Amiodarone and DCCV. He was back in AF when I saw him 5/28- Amiodarone  increased to 200 mg daily.  He has had a tendency to bradycardia when in NSR, he is not on a beta blocker.  Congestive dilated cardiomyopathy (HCC) EF 20% by TEE May 2019 when he was in AF  CKD (chronic kidney disease) stage 3, GFR 30-59 ml/min (HCC) GFR 28- SCr 1.83 -May 2019  Chronic anticoagulation Low dose Eliquis    PLAN  I suggested he take his Lasix all at once in the am. I stopped his Diltiazem (constipation and LE edema)- we'll leave him on Amiodarone for rate control.  F/U with Dr Percival Spanish in August- his code status should be addressed- he was a full code in May 2019.  Kerin Ransom PA-C 06/10/2018 10:46 AM

## 2018-06-10 NOTE — Patient Instructions (Signed)
Kerin Ransom, PA-C has recommended making the following medication changes: 1. STOP Diltiazem 2. CHANGE the way you take Furosemide  >>TAKE 80 mg ONCE DAILY  Your physician recommends that you schedule a follow-up appointment in AUGUST 2019 with Dr Percival Spanish.  If you need a refill on your cardiac medications before your next appointment, please call your pharmacy.

## 2018-06-15 ENCOUNTER — Other Ambulatory Visit: Payer: Self-pay

## 2018-06-15 ENCOUNTER — Emergency Department (HOSPITAL_COMMUNITY): Payer: Medicare Other

## 2018-06-15 ENCOUNTER — Inpatient Hospital Stay (HOSPITAL_COMMUNITY)
Admission: EM | Admit: 2018-06-15 | Discharge: 2018-06-18 | DRG: 871 | Disposition: A | Discharge status: Home Health | Payer: Medicare Other | Attending: Internal Medicine | Admitting: Internal Medicine

## 2018-06-15 ENCOUNTER — Encounter (HOSPITAL_COMMUNITY): Payer: Self-pay | Admitting: Emergency Medicine

## 2018-06-15 DIAGNOSIS — F329 Major depressive disorder, single episode, unspecified: Secondary | ICD-10-CM | POA: Diagnosis present

## 2018-06-15 DIAGNOSIS — I429 Cardiomyopathy, unspecified: Secondary | ICD-10-CM | POA: Diagnosis not present

## 2018-06-15 DIAGNOSIS — Z66 Do not resuscitate: Secondary | ICD-10-CM | POA: Diagnosis not present

## 2018-06-15 DIAGNOSIS — M1990 Unspecified osteoarthritis, unspecified site: Secondary | ICD-10-CM | POA: Diagnosis present

## 2018-06-15 DIAGNOSIS — J159 Unspecified bacterial pneumonia: Secondary | ICD-10-CM | POA: Diagnosis not present

## 2018-06-15 DIAGNOSIS — K5900 Constipation, unspecified: Secondary | ICD-10-CM | POA: Diagnosis not present

## 2018-06-15 DIAGNOSIS — I480 Paroxysmal atrial fibrillation: Secondary | ICD-10-CM | POA: Diagnosis not present

## 2018-06-15 DIAGNOSIS — K567 Ileus, unspecified: Secondary | ICD-10-CM | POA: Diagnosis not present

## 2018-06-15 DIAGNOSIS — F419 Anxiety disorder, unspecified: Secondary | ICD-10-CM | POA: Diagnosis present

## 2018-06-15 DIAGNOSIS — N400 Enlarged prostate without lower urinary tract symptoms: Secondary | ICD-10-CM | POA: Diagnosis not present

## 2018-06-15 DIAGNOSIS — N183 Chronic kidney disease, stage 3 unspecified: Secondary | ICD-10-CM | POA: Diagnosis present

## 2018-06-15 DIAGNOSIS — A419 Sepsis, unspecified organism: Secondary | ICD-10-CM | POA: Diagnosis not present

## 2018-06-15 DIAGNOSIS — K219 Gastro-esophageal reflux disease without esophagitis: Secondary | ICD-10-CM | POA: Diagnosis present

## 2018-06-15 DIAGNOSIS — Z79899 Other long term (current) drug therapy: Secondary | ICD-10-CM | POA: Diagnosis not present

## 2018-06-15 DIAGNOSIS — N179 Acute kidney failure, unspecified: Secondary | ICD-10-CM | POA: Diagnosis not present

## 2018-06-15 DIAGNOSIS — E785 Hyperlipidemia, unspecified: Secondary | ICD-10-CM | POA: Diagnosis present

## 2018-06-15 DIAGNOSIS — I10 Essential (primary) hypertension: Secondary | ICD-10-CM | POA: Diagnosis not present

## 2018-06-15 DIAGNOSIS — J9601 Acute respiratory failure with hypoxia: Secondary | ICD-10-CM

## 2018-06-15 DIAGNOSIS — I2510 Atherosclerotic heart disease of native coronary artery without angina pectoris: Secondary | ICD-10-CM | POA: Diagnosis present

## 2018-06-15 DIAGNOSIS — I482 Chronic atrial fibrillation: Secondary | ICD-10-CM | POA: Diagnosis not present

## 2018-06-15 DIAGNOSIS — D696 Thrombocytopenia, unspecified: Secondary | ICD-10-CM | POA: Diagnosis not present

## 2018-06-15 DIAGNOSIS — N281 Cyst of kidney, acquired: Secondary | ICD-10-CM | POA: Diagnosis present

## 2018-06-15 DIAGNOSIS — E872 Acidosis: Secondary | ICD-10-CM | POA: Diagnosis not present

## 2018-06-15 DIAGNOSIS — I130 Hypertensive heart and chronic kidney disease with heart failure and stage 1 through stage 4 chronic kidney disease, or unspecified chronic kidney disease: Secondary | ICD-10-CM | POA: Diagnosis present

## 2018-06-15 DIAGNOSIS — J90 Pleural effusion, not elsewhere classified: Secondary | ICD-10-CM | POA: Diagnosis not present

## 2018-06-15 DIAGNOSIS — J181 Lobar pneumonia, unspecified organism: Secondary | ICD-10-CM | POA: Diagnosis not present

## 2018-06-15 DIAGNOSIS — J189 Pneumonia, unspecified organism: Secondary | ICD-10-CM

## 2018-06-15 DIAGNOSIS — I420 Dilated cardiomyopathy: Secondary | ICD-10-CM | POA: Diagnosis present

## 2018-06-15 DIAGNOSIS — N131 Hydronephrosis with ureteral stricture, not elsewhere classified: Secondary | ICD-10-CM | POA: Diagnosis present

## 2018-06-15 DIAGNOSIS — I5023 Acute on chronic systolic (congestive) heart failure: Secondary | ICD-10-CM | POA: Diagnosis not present

## 2018-06-15 DIAGNOSIS — Z86010 Personal history of colonic polyps: Secondary | ICD-10-CM

## 2018-06-15 DIAGNOSIS — Z888 Allergy status to other drugs, medicaments and biological substances status: Secondary | ICD-10-CM

## 2018-06-15 DIAGNOSIS — Z7901 Long term (current) use of anticoagulants: Secondary | ICD-10-CM

## 2018-06-15 HISTORY — DX: Paroxysmal atrial fibrillation: I48.0

## 2018-06-15 LAB — CBC
HCT: 48.7 % (ref 39.0–52.0)
HEMOGLOBIN: 16.2 g/dL (ref 13.0–17.0)
MCH: 32.2 pg (ref 26.0–34.0)
MCHC: 33.3 g/dL (ref 30.0–36.0)
MCV: 96.8 fL (ref 78.0–100.0)
PLATELETS: 112 10*3/uL — AB (ref 150–400)
RBC: 5.03 MIL/uL (ref 4.22–5.81)
RDW: 16.3 % — ABNORMAL HIGH (ref 11.5–15.5)
WBC: 15 10*3/uL — AB (ref 4.0–10.5)

## 2018-06-15 LAB — BASIC METABOLIC PANEL
ANION GAP: 12 (ref 5–15)
Anion gap: 9 (ref 5–15)
BUN: 33 mg/dL — ABNORMAL HIGH (ref 8–23)
BUN: 31 mg/dL — ABNORMAL HIGH (ref 8–23)
CALCIUM: 8.1 mg/dL — AB (ref 8.9–10.3)
CALCIUM: 7.7 mg/dL — AB (ref 8.9–10.3)
CHLORIDE: 102 mmol/L (ref 98–111)
CO2: 27 mmol/L (ref 22–32)
CO2: 30 mmol/L (ref 22–32)
CREATININE: 2.12 mg/dL — AB (ref 0.61–1.24)
CREATININE: 1.86 mg/dL — AB (ref 0.61–1.24)
Chloride: 104 mmol/L (ref 98–111)
GFR calc non Af Amer: 25 mL/min — ABNORMAL LOW (ref >60)
GFR, EST AFRICAN AMERICAN: 29 mL/min — AB (ref >60)
GFR, EST AFRICAN AMERICAN: 34 mL/min — AB (ref >60)
GFR, EST NON AFRICAN AMERICAN: 29 mL/min — AB (ref >60)
Glucose, Bld: 181 mg/dL — ABNORMAL HIGH (ref 70–99)
Glucose, Bld: 130 mg/dL — ABNORMAL HIGH (ref 70–99)
Potassium: 4.6 mmol/L (ref 3.5–5.1)
Potassium: 3.7 mmol/L (ref 3.5–5.1)
SODIUM: 141 mmol/L (ref 135–145)
SODIUM: 143 mmol/L (ref 135–145)

## 2018-06-15 LAB — POC OCCULT BLOOD, ED: FECAL OCCULT BLD: NEGATIVE

## 2018-06-15 LAB — CBC WITH DIFFERENTIAL/PLATELET
BASOS ABS: 0 10*3/uL (ref 0.0–0.1)
BASOS PCT: 0 %
EOS ABS: 0 10*3/uL (ref 0.0–0.7)
Eosinophils Relative: 0 %
HCT: 51.6 % (ref 39.0–52.0)
HEMOGLOBIN: 17.5 g/dL — AB (ref 13.0–17.0)
Lymphocytes Relative: 2 %
Lymphs Abs: 0.3 10*3/uL — ABNORMAL LOW (ref 0.7–4.0)
MCH: 32.5 pg (ref 26.0–34.0)
MCHC: 33.9 g/dL (ref 30.0–36.0)
MCV: 95.9 fL (ref 78.0–100.0)
MONOS PCT: 8 %
Monocytes Absolute: 1 10*3/uL (ref 0.1–1.0)
Neutro Abs: 11.8 10*3/uL — ABNORMAL HIGH (ref 1.7–7.7)
Neutrophils Relative %: 90 %
Platelets: 120 10*3/uL — ABNORMAL LOW (ref 150–400)
RBC: 5.38 MIL/uL (ref 4.22–5.81)
RDW: 16 % — AB (ref 11.5–15.5)
WBC: 13.2 10*3/uL — AB (ref 4.0–10.5)

## 2018-06-15 LAB — I-STAT CG4 LACTIC ACID, ED
LACTIC ACID, VENOUS: 4.27 mmol/L — AB (ref 0.5–1.9)
LACTIC ACID, VENOUS: 2.71 mmol/L — AB (ref 0.5–1.9)

## 2018-06-15 LAB — LACTIC ACID, PLASMA: LACTIC ACID, VENOUS: 1.6 mmol/L (ref 0.5–1.9)

## 2018-06-15 LAB — URINALYSIS, ROUTINE W REFLEX MICROSCOPIC
Bilirubin Urine: NEGATIVE
Glucose, UA: NEGATIVE mg/dL
Hgb urine dipstick: NEGATIVE
KETONES UR: NEGATIVE mg/dL
Leukocytes, UA: NEGATIVE
NITRITE: NEGATIVE
Protein, ur: NEGATIVE mg/dL
Specific Gravity, Urine: 1.012 (ref 1.005–1.030)
pH: 6 (ref 5.0–8.0)

## 2018-06-15 LAB — STREP PNEUMONIAE URINARY ANTIGEN: Strep Pneumo Urinary Antigen: NEGATIVE

## 2018-06-15 MED ORDER — SODIUM CHLORIDE 0.9 % IV BOLUS
1000.0000 mL | Freq: Once | INTRAVENOUS | Status: AC
  Administered 2018-06-15: 1000 mL via INTRAVENOUS

## 2018-06-15 MED ORDER — APIXABAN 2.5 MG PO TABS
2.5000 mg | ORAL_TABLET | Freq: Two times a day (BID) | ORAL | Status: DC
  Administered 2018-06-15 – 2018-06-18 (×7): 2.5 mg via ORAL
  Filled 2018-06-15 (×7): qty 1

## 2018-06-15 MED ORDER — PRAVASTATIN SODIUM 20 MG PO TABS
20.0000 mg | ORAL_TABLET | Freq: Every day | ORAL | Status: DC
  Administered 2018-06-15 – 2018-06-18 (×4): 20 mg via ORAL
  Filled 2018-06-15 (×4): qty 1

## 2018-06-15 MED ORDER — LEVALBUTEROL HCL 0.63 MG/3ML IN NEBU
0.6300 mg | INHALATION_SOLUTION | Freq: Three times a day (TID) | RESPIRATORY_TRACT | Status: DC
Start: 2018-06-15 — End: 2018-06-16
  Administered 2018-06-15 – 2018-06-16 (×2): 0.63 mg via RESPIRATORY_TRACT
  Filled 2018-06-15 (×2): qty 3

## 2018-06-15 MED ORDER — FINASTERIDE 5 MG PO TABS
5.0000 mg | ORAL_TABLET | Freq: Every day | ORAL | Status: DC
  Administered 2018-06-15 – 2018-06-18 (×4): 5 mg via ORAL
  Filled 2018-06-15 (×4): qty 1

## 2018-06-15 MED ORDER — AMIODARONE HCL 200 MG PO TABS
200.0000 mg | ORAL_TABLET | Freq: Every day | ORAL | Status: DC
  Administered 2018-06-15 – 2018-06-18 (×4): 200 mg via ORAL
  Filled 2018-06-15 (×4): qty 1

## 2018-06-15 MED ORDER — FUROSEMIDE 10 MG/ML IJ SOLN
20.0000 mg | Freq: Once | INTRAMUSCULAR | Status: AC
  Administered 2018-06-15: 20 mg via INTRAVENOUS
  Filled 2018-06-15: qty 2

## 2018-06-15 MED ORDER — SODIUM CHLORIDE 0.9% FLUSH
3.0000 mL | Freq: Two times a day (BID) | INTRAVENOUS | Status: DC
  Administered 2018-06-15 – 2018-06-18 (×7): 3 mL via INTRAVENOUS

## 2018-06-15 MED ORDER — LINACLOTIDE 145 MCG PO CAPS
290.0000 ug | ORAL_CAPSULE | Freq: Every day | ORAL | Status: DC
  Administered 2018-06-16 – 2018-06-18 (×3): 290 ug via ORAL
  Filled 2018-06-15: qty 1
  Filled 2018-06-15 (×4): qty 2

## 2018-06-15 MED ORDER — TRAMADOL HCL 50 MG PO TABS: 25.0000 mg | ORAL_TABLET | Freq: Four times a day (QID) | ORAL | Status: DC | PRN

## 2018-06-15 MED ORDER — SODIUM CHLORIDE 0.9 % IV SOLN
1.0000 g | Freq: Once | INTRAVENOUS | Status: AC
  Administered 2018-06-15: 1 g via INTRAVENOUS
  Filled 2018-06-15: qty 10

## 2018-06-15 MED ORDER — SODIUM CHLORIDE 0.9 % IV SOLN
1.0000 g | INTRAVENOUS | Status: DC
  Administered 2018-06-16 – 2018-06-18 (×3): 1 g via INTRAVENOUS
  Filled 2018-06-15 (×3): qty 1

## 2018-06-15 MED ORDER — LEVALBUTEROL HCL 0.63 MG/3ML IN NEBU
0.6300 mg | INHALATION_SOLUTION | Freq: Three times a day (TID) | RESPIRATORY_TRACT | Status: DC
Start: 2018-06-15 — End: 2018-06-15
  Administered 2018-06-15: 0.63 mg via RESPIRATORY_TRACT
  Filled 2018-06-15: qty 3

## 2018-06-15 MED ORDER — ACETAMINOPHEN 650 MG RE SUPP: 650.0000 mg | Freq: Four times a day (QID) | RECTAL | Status: DC | PRN

## 2018-06-15 MED ORDER — SENNA 8.6 MG PO TABS
1.0000 | ORAL_TABLET | Freq: Every day | ORAL | Status: DC
Start: 2018-06-15 — End: 2018-06-18
  Administered 2018-06-15 – 2018-06-17 (×3): 8.6 mg via ORAL
  Filled 2018-06-15 (×3): qty 1

## 2018-06-15 MED ORDER — FUROSEMIDE 40 MG PO TABS
80.0000 mg | ORAL_TABLET | Freq: Every day | ORAL | Status: DC
  Administered 2018-06-15 – 2018-06-17 (×3): 80 mg via ORAL
  Filled 2018-06-15 (×3): qty 2

## 2018-06-15 MED ORDER — GUAIFENESIN ER 600 MG PO TB12
600.0000 mg | ORAL_TABLET | Freq: Two times a day (BID) | ORAL | Status: DC
  Administered 2018-06-15 (×2): 600 mg via ORAL
  Filled 2018-06-15 (×2): qty 1

## 2018-06-15 MED ORDER — SODIUM CHLORIDE 0.9 % IV SOLN
500.0000 mg | Freq: Once | INTRAVENOUS | Status: AC
  Administered 2018-06-15: 500 mg via INTRAVENOUS
  Filled 2018-06-15: qty 500

## 2018-06-15 MED ORDER — ACETAMINOPHEN 325 MG PO TABS: 650.0000 mg | ORAL_TABLET | Freq: Four times a day (QID) | ORAL | Status: DC | PRN

## 2018-06-15 MED ORDER — ONDANSETRON HCL 4 MG PO TABS: 4.0000 mg | ORAL_TABLET | Freq: Four times a day (QID) | ORAL | Status: DC | PRN

## 2018-06-15 MED ORDER — BENZONATATE 100 MG PO CAPS
100.0000 mg | ORAL_CAPSULE | Freq: Three times a day (TID) | ORAL | Status: DC | PRN
  Administered 2018-06-17 – 2018-06-18 (×3): 100 mg via ORAL
  Filled 2018-06-15 (×3): qty 1

## 2018-06-15 MED ORDER — SODIUM CHLORIDE 0.9% FLUSH: 3.0000 mL | INTRAVENOUS | Status: DC | PRN

## 2018-06-15 MED ORDER — ONDANSETRON HCL 4 MG/2ML IJ SOLN: 4.0000 mg | Freq: Four times a day (QID) | INTRAMUSCULAR | Status: DC | PRN

## 2018-06-15 MED ORDER — FAMOTIDINE 20 MG PO TABS
20.0000 mg | ORAL_TABLET | Freq: Two times a day (BID) | ORAL | Status: DC
  Administered 2018-06-15 – 2018-06-18 (×7): 20 mg via ORAL
  Filled 2018-06-15 (×7): qty 1

## 2018-06-15 MED ORDER — ALPRAZOLAM 0.5 MG PO TABS
1.0000 mg | ORAL_TABLET | Freq: Every day | ORAL | Status: DC
  Administered 2018-06-15 – 2018-06-17 (×3): 1 mg via ORAL
  Filled 2018-06-15 (×3): qty 2

## 2018-06-15 MED ORDER — POLYETHYLENE GLYCOL 3350 17 G PO PACK
17.0000 g | PACK | Freq: Every day | ORAL | Status: DC
  Administered 2018-06-15 – 2018-06-16 (×2): 17 g via ORAL
  Filled 2018-06-15 (×2): qty 1

## 2018-06-15 MED ORDER — SODIUM CHLORIDE 0.9 % IV SOLN: 250.0000 mL | INTRAVENOUS | Status: DC | PRN

## 2018-06-15 MED ORDER — SODIUM CHLORIDE 0.9 % IV SOLN
500.0000 mg | INTRAVENOUS | Status: DC
  Administered 2018-06-16 – 2018-06-17 (×2): 500 mg via INTRAVENOUS
  Filled 2018-06-15 (×3): qty 500

## 2018-06-15 NOTE — Progress Notes (Signed)
Order for 20mg  IV lasix and 80mg  PO lasix, notified Dr. Wynelle Cleveland of she wants the Iv or PO lasix given, she stated to give both IV and PO.

## 2018-06-15 NOTE — Evaluation (Signed)
Physical Therapy Evaluation Patient Details Name: [name] [name] MRN: [id number] DOB: 10-07-1920 Today's Date: 06/15/2018   History of Present Illness  84 y.o. male with medical history of PAF s/p TEE with cardioversion, cardiomyopathy with EF of 20%, HTN, CKD3, BPH, right eye blindness and admitted for CAP and sepsis  Clinical Impression  Pt admitted with above diagnosis. Pt currently with functional limitations due to the deficits listed below (see PT Problem List).  Pt will benefit from skilled PT to increase their independence and safety with mobility to allow discharge to the venue listed below.  Pt reports having a "girlfriend" and his son is a retired PT.  Pt plans on discharging home.  Recommend initial 24/7 supervision due to high fall risk.     Follow Up Recommendations Home health PT;Supervision/Assistance - 24 hour    Equipment Recommendations  None recommended by PT    Recommendations for Other Services       Precautions / Restrictions Precautions Precautions: Fall      Mobility  Bed Mobility Overal bed mobility: Needs Assistance Bed Mobility: Supine to Sit     Supine to sit: Min assist     General bed mobility comments: slight assist for trunk upright  Transfers Overall transfer level: Needs assistance Equipment used: Rolling walker (2 wheeled) Transfers: Sit to/from Stand Sit to Stand: Min assist         General transfer comment: assist to rise and steady  Ambulation/Gait Ambulation/Gait assistance: Min assist Gait Distance (Feet): 100 Feet Assistive device: Rolling walker (2 wheeled) Gait Pattern/deviations: Step-through pattern;Decreased stride length     General Gait Details: verbal cues for posture, assist x2 for LOB, SpO2 reading 77% on room air however finger gripping RW so likely inaccurate, SpO2 94% room air once seated in recliner; pt reports fatigue, denies SOB  Stairs            Wheelchair Mobility    Modified Rankin (Stroke  Patients Only)       Balance Overall balance assessment: Mild deficits observed, not formally tested                                           Pertinent Vitals/Pain Pain Assessment: No/denies pain    Home Living Family/patient expects to be discharged to:: Private residence Living Arrangements: Alone Available Help at Discharge: Family;Friend(s) Type of Home: House Home Access: Stairs to enter Entrance Stairs-Rails: Psychiatric nurse of Steps: 3 Home Layout: One level Home Equipment: Cane - single point;Walker - 2 wheels;Bedside commode      Prior Function Level of Independence: Independent with assistive device(s)         Comments: amb with RW     Hand Dominance        Extremity/Trunk Assessment        Lower Extremity Assessment Lower Extremity Assessment: Generalized weakness    Cervical / Trunk Assessment Cervical / Trunk Assessment: Kyphotic  Communication   Communication: No difficulties(blind R eye)  Cognition Arousal/Alertness: Awake/alert Behavior During Therapy: WFL for tasks assessed/performed Overall Cognitive Status: Within Functional Limits for tasks assessed                                        General Comments      Exercises  Assessment/Plan    PT Assessment Patient needs continued PT services  PT Problem List Decreased strength;Decreased mobility;Decreased activity tolerance;Decreased balance       PT Treatment Interventions DME instruction;Therapeutic activities;Gait training;Therapeutic exercise;Patient/family education;Functional mobility training;Balance training;Stair training    PT Goals (Current goals can be found in the Care Plan section)  Acute Rehab PT Goals PT Goal Formulation: With patient Time For Goal Achievement: 06/29/18 Potential to Achieve Goals: Good    Frequency Min 3X/week   Barriers to discharge        Co-evaluation                AM-PAC PT "6 Clicks" Daily Activity  Outcome Measure Difficulty turning over in bed (including adjusting bedclothes, sheets and blankets)?: A Little Difficulty moving from lying on back to sitting on the side of the bed? : Unable Difficulty sitting down on and standing up from a chair with arms (e.g., wheelchair, bedside commode, etc,.)?: Unable Help needed moving to and from a bed to chair (including a wheelchair)?: A Little Help needed walking in hospital room?: A Little Help needed climbing 3-5 steps with a railing? : A Lot 6 Click Score: 13    End of Session Equipment Utilized During Treatment: Gait belt Activity Tolerance: Patient tolerated treatment well Patient left: in chair;with chair alarm set;with call bell/phone within reach Nurse Communication: Mobility status PT Visit Diagnosis: Other abnormalities of gait and mobility (R26.89)    Time: 2080-2233 PT Time Calculation (min) (ACUTE ONLY): 12 min   Charges:   PT Evaluation $PT Eval Low Complexity: 1 Low          Carmelia Bake, PT, DPT 06/15/2018 Pager: [phone]  [name] 06/15/2018, 4:00 PM

## 2018-06-15 NOTE — ED Notes (Signed)
Patient transported to CT 

## 2018-06-15 NOTE — ED Provider Notes (Addendum)
[place] DEPT Provider Note: Georgena Spurling, MD, FACEP  CSN: [id number] MRN: [id number] ARRIVAL: 06/15/18 at [place]: [place]   HISTORY OF PRESENT ILLNESS  06/15/18 1:34 AM [name] [name] is a 84 y.o. male is here with an episode of generalized shaking which began about 10 PM yesterday evening and lasted about an hour.  He states he did not feel cold or chilled at the time but his wife states he felt warm to the touch.  He denies chest pain, shortness of breath, cough, nausea, vomiting or abdominal pain.  He has been constipated for about 4 days with no bowel movement during that time.  He has tried a Radiation protection practitioner enema, stool softener, MiraLAX and Senokot without relief.  He denies dysuria.  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  He was noted to have an oxygen saturation of 88% on room air which improved with supplemental oxygen by nasal cannula.  Past Medical History:  Diagnosis Date  . Anxiety   . Arthritis    "shoulders" (05/28/2016)  . Cardiomyopathy   . Coronary artery disease   . Depression    hx (05/28/2016)  . Hemorrhoids   . Hyperlipidemia   . Hypertension   . Paroxysmal atrial fibrillation (HCC)   . Skipped heart beats     Past Surgical History:  Procedure Laterality Date  . APPENDECTOMY    . CARDIOVERSION N/A 06/04/2016   Procedure: CARDIOVERSION;  Surgeon: Pixie Casino, MD;  Location: [facility] ENDOSCOPY;  Service: Cardiovascular;  Laterality: N/A;  . CARDIOVERSION N/A 04/09/2017   Procedure: CARDIOVERSION;  Surgeon: Thayer Headings, MD;  Location: [facility] ENDOSCOPY;  Service: Cardiovascular;  Laterality: N/A;  . CARDIOVERSION N/A 04/01/2018   Procedure: CARDIOVERSION;  Surgeon: Skeet Latch, MD;  Location: [place];  Service: Cardiovascular;  Laterality: N/A;  . CATARACT EXTRACTION Left   . CHOLECYSTECTOMY OPEN    . COLONOSCOPY W/ BIOPSIES AND POLYPECTOMY    . INGUINAL HERNIA REPAIR Bilateral   . MYRINGOTOMY WITH TUBE  PLACEMENT Bilateral   . skin cancer removal Right    side of nose by Rt eye  . TEE WITHOUT CARDIOVERSION N/A 06/04/2016   Procedure: TRANSESOPHAGEAL ECHOCARDIOGRAM (TEE);  Surgeon: Pixie Casino, MD;  Location: [facility] ENDOSCOPY;  Service: Cardiovascular;  Laterality: N/A;  . TEE WITHOUT CARDIOVERSION N/A 04/01/2018   Procedure: TRANSESOPHAGEAL ECHOCARDIOGRAM (TEE);  Surgeon: Skeet Latch, MD;  Location: [place];  Service: Cardiovascular;  Laterality: N/A;  . TONSILLECTOMY AND ADENOIDECTOMY      Family History  Problem Relation Age of Onset  . Heart disease Brother     Social History   Tobacco Use  . Smoking status: Never Smoker  . Smokeless tobacco: Never Used  Substance Use Topics  . Alcohol use: No  . Drug use: No    Prior to Admission medications   Medication Sig Start Date End Date Taking? Authorizing Provider  ALPRAZolam Duanne Moron) 1 MG tablet Take 1 tablet (1 mg total) by mouth at bedtime. 04/22/18  Yes Plotnikov, Evie Lacks, MD  amiodarone (PACERONE) 200 MG tablet TAKE 1 TABLET ONCE DAILY. 05/05/18  Yes Plotnikov, Evie Lacks, MD  apixaban (ELIQUIS) 2.5 MG TABS tablet Take 1 tablet (2.5 mg total) by mouth 2 (two) times daily. 01/14/18  Yes Minus Breeding, MD  benzonatate (TESSALON) 100 MG capsule Take 1 capsule (100 mg total) by mouth 3 (three) times daily as needed. Patient taking differently: Take 100 mg by  mouth 3 (three) times daily as needed for cough.  03/11/18  Yes Marrian Salvage, FNP  finasteride (PROSCAR) 5 MG tablet Take 1 tablet (5 mg total) by mouth daily. 10/24/17  Yes Plotnikov, Evie Lacks, MD  linaclotide (LINZESS) 290 MCG CAPS capsule Take 1 capsule (290 mcg total) by mouth daily before breakfast. Patient taking differently: Take 28 mcg by mouth daily as needed (constipation).  06/30/17  Yes Pyrtle, Lajuan Lines, MD  polyethylene glycol (MIRALAX / GLYCOLAX) packet Take 17 g by mouth at bedtime.   Yes [provider]  polyethylene glycol-electrolytes  (NULYTELY/GOLYTELY) 420 g solution Use 200 ml 4 times a day prn constipation in addition to Miralax (17 g bid) and Linzess 05/29/18  Yes Plotnikov, Evie Lacks, MD  pravastatin (PRAVACHOL) 20 MG tablet TAKE 1 TABLET EACH DAY. 04/09/18  Yes Plotnikov, Evie Lacks, MD  ranitidine (ZANTAC) 150 MG tablet Take 1 tablet (150 mg total) by mouth 2 (two) times daily as needed for heartburn. 10/24/17  Yes Plotnikov, Evie Lacks, MD  senna (SENOKOT) 8.6 MG TABS tablet Take 1-2 tablets (8.6-17.2 mg total) by mouth at bedtime. 12/23/17  Yes Plotnikov, Evie Lacks, MD  traMADol (ULTRAM) 50 MG tablet Take 0.5-1 tablets (25-50 mg total) by mouth every 6 (six) hours as needed for severe pain. 05/06/18  Yes Plotnikov, Evie Lacks, MD  VENTOLIN HFA 108 (90 Base) MCG/ACT inhaler Inhale 2 puffs into the lungs every 6 (six) hours as needed for wheezing or shortness of breath.  03/19/18  Yes [provider]  furosemide (LASIX) 40 MG tablet Take 2 tablets (80 mg total) by mouth every morning. 06/10/18   Erlene Quan, PA-C    Allergies Lisinopril   REVIEW OF SYSTEMS  Negative except as noted here or in the History of Present Illness.   PHYSICAL EXAMINATION  Initial Vital Signs Blood pressure 115/72, pulse 73, temperature 98.9 F (37.2 C), temperature source Oral, resp. rate 18, SpO2 (!) 88 %.  Examination General: Well-developed, well-nourished male in no acute distress; appearance consistent with age of record HENT: normocephalic; atraumatic Eyes: Left pupil round and nonreactive, pseudophakia; right pupil not seen due to corneal opacity Neck: supple Heart: regular rate and rhythm Lungs: clear to auscultation bilaterally Abdomen: soft; nondistended; nontender; no masses or hepatosplenomegaly; bowel sounds present Rectal: Normal sphincter tone; no impaction palpated; small amount of tan-colored stool high in rectal vault Extremities: No deformity; full range of motion; pulses normal; edema of lower legs Neurologic:  Awake, alert and oriented; motor function intact in all extremities and symmetric; no facial droop Skin: Warm and dry Psychiatric: Normal mood and affect   RESULTS  Summary of this visit's results, reviewed by myself:   EKG Interpretation  Date/Time:    Ventricular Rate:    PR Interval:    QRS Duration:   QT Interval:    QTC Calculation:   R Axis:     Text Interpretation:        Laboratory Studies: Results for orders placed or performed during the hospital encounter of 06/15/18 (from the past 24 hour(s))  CBC with Differential/Platelet     Status: Abnormal   Collection Time: 06/15/18  1:51 AM  Result Value Ref Range   WBC 13.2 (H) 4.0 - 10.5 K/uL   RBC 5.38 4.22 - 5.81 MIL/uL   Hemoglobin 17.5 (H) 13.0 - 17.0 g/dL   HCT 51.6 39.0 - 52.0 %   MCV 95.9 78.0 - 100.0 fL   MCH 32.5 26.0 - 34.0  pg   MCHC 33.9 30.0 - 36.0 g/dL   RDW 16.0 (H) 11.5 - 15.5 %   Platelets 120 (L) 150 - 400 K/uL   Neutrophils Relative % 90 %   Neutro Abs 11.8 (H) 1.7 - 7.7 K/uL   Lymphocytes Relative 2 %   Lymphs Abs 0.3 (L) 0.7 - 4.0 K/uL   Monocytes Relative 8 %   Monocytes Absolute 1.0 0.1 - 1.0 K/uL   Eosinophils Relative 0 %   Eosinophils Absolute 0.0 0.0 - 0.7 K/uL   Basophils Relative 0 %   Basophils Absolute 0.0 0.0 - 0.1 K/uL  Basic metabolic panel     Status: Abnormal   Collection Time: 06/15/18  1:51 AM  Result Value Ref Range   Sodium 141 135 - 145 mmol/L   Potassium 4.6 3.5 - 5.1 mmol/L   Chloride 102 98 - 111 mmol/L   CO2 27 22 - 32 mmol/L   Glucose, Bld 181 (H) 70 - 99 mg/dL   BUN 33 (H) 8 - 23 mg/dL   Creatinine, Ser 2.12 (H) 0.61 - 1.24 mg/dL   Calcium 8.1 (L) 8.9 - 10.3 mg/dL   GFR calc non Af Amer 25 (L) >60 mL/min   GFR calc Af Amer 29 (L) >60 mL/min   Anion gap 12 5 - 15  POC occult blood, ED     Status: None   Collection Time: 06/15/18  1:54 AM  Result Value Ref Range   Fecal Occult Bld NEGATIVE NEGATIVE  I-Stat CG4 Lactic Acid, ED     Status: Abnormal    Collection Time: 06/15/18  2:38 AM  Result Value Ref Range   Lactic Acid, Venous 4.27 (HH) 0.5 - 1.9 mmol/L   Comment NOTIFIED PHYSICIAN   I-Stat CG4 Lactic Acid, ED     Status: Abnormal   Collection Time: 06/15/18  4:01 AM  Result Value Ref Range   Lactic Acid, Venous 2.71 (HH) 0.5 - 1.9 mmol/L   Comment NOTIFIED PHYSICIAN   Urinalysis, Routine w reflex microscopic     Status: None   Collection Time: 06/15/18  4:59 AM  Result Value Ref Range   Color, Urine YELLOW YELLOW   APPearance CLEAR CLEAR   Specific Gravity, Urine 1.012 1.005 - 1.030   pH 6.0 5.0 - 8.0   Glucose, UA NEGATIVE NEGATIVE mg/dL   Hgb urine dipstick NEGATIVE NEGATIVE   Bilirubin Urine NEGATIVE NEGATIVE   Ketones, ur NEGATIVE NEGATIVE mg/dL   Protein, ur NEGATIVE NEGATIVE mg/dL   Nitrite NEGATIVE NEGATIVE   Leukocytes, UA NEGATIVE NEGATIVE   Imaging Studies: Ct Abdomen Pelvis Wo Contrast  Result Date: 06/15/2018 CLINICAL DATA:  84 year old male with constipation for 3 days. No relief with over the counter stool softeners. Post bilateral inguinal hernia repair, cholecystectomy and appendectomy. Subsequent encounter. EXAM: CT ABDOMEN AND PELVIS WITHOUT CONTRAST TECHNIQUE: Multidetector CT imaging of the abdomen and pelvis was performed following the standard protocol without IV contrast. COMPARISON:  08/16/2018 plain film exam.  04/06/2017 CT. FINDINGS: Lower chest: Bilateral lower lobe and inferior lingula consolidation may represent infiltrates and/or atelectasis. Associated pleural effusion greater on the right. Small amount of gas within the non dependent aspect of the right atrium and right ventricle. Question if this is related to IV line. Heart slightly enlarged. Hepatobiliary: Taking into account limitation by non contrast imaging, no worrisome hepatic lesion. Post cholecystectomy. Pancreas: Taking into account limitation by non contrast imaging, no worrisome pancreatic lesion or inflammation. Spleen: Taking into  account limitation  by non contrast imaging, no splenic mass or enlargement. Adrenals/Urinary Tract: Chronic left hydronephrosis with ureteral pelvic junction obstruction pattern unchanged. No right-sided hydronephrosis. Bilateral renal cysts some of which are minimally complex. Taking into account limitation by non contrast imaging, no worrisome renal or adrenal lesion. Noncontrast filled imaging of the urinary bladder unremarkable. Stomach/Bowel: Cecum and appendix extend into right inguinal canal. The terminal ileum is just above the herniated bowel. Fluid and gas-filled top-normal size small bowel loops without evidence of obstruction. If there were further herniation of colon into the right inguinal canal, it is possible this could cause small-bowel obstruction. Stool and gas throughout the colon without obstruction noted. Stomach unremarkable. Vascular/Lymphatic: Atherosclerotic changes aorta and aortic branch vessels. No abdominal aortic aneurysm. Scattered normal size lymph nodes. Reproductive: No worrisome abnormality. Other: No free intraperitoneal air.  No drainable fluid collection. Musculoskeletal: Degenerative changes throughout the lower thoracic and lumbar spine most notable L4-5 level. Hip joint degenerative changes. Sacroiliac joint degenerative changes. IMPRESSION: 1. Small amount of gas within the non dependent aspect of the right atrium and right ventricle. Question if this is related to IV line. 2. Cecum and appendix extend into right inguinal canal. The terminal ileum is just above the herniated bowel. Fluid and gas-filled top-normal size small bowel loops without evidence of obstruction. If there were further herniation of colon into the right inguinal canal, it is possible this could cause small-bowel obstruction. 3. Stool and gas throughout the colon without obstruction noted. 4. Bilateral lower lobe and inferior lingula consolidation may represent infiltrates and/or atelectasis. Associated  pleural effusion greater on the right. 5. Chronic left hydronephrosis with ureteral pelvic junction obstruction pattern unchanged. Bilateral renal cysts similar to prior exam. 6.  Aortic Atherosclerosis (ICD10-I70.0). These results will be called to the ordering clinician or representative by the Radiologist Assistant, and communication documented in the PACS or zVision Dashboard. Electronically Signed   By: Genia Del M.D.   On: 06/15/2018 07:24   Dg Abd Acute W/chest  Result Date: 06/15/2018 CLINICAL DATA:  Constipation for 3 days. EXAM: DG ABDOMEN ACUTE W/ 1V CHEST COMPARISON:  Chest radiograph Mar 30, 2018 FINDINGS: Stable cardiomegaly. Calcified aortic knob. Small to moderate pleural layering pleural effusions, decreased on the RIGHT. Patchy LEFT mid lung zone airspace opacity. No pneumothorax. Osteopenia. Severe degenerative change of the shoulders. Old RIGHT rib fractures. Gas-filled mildly distended small large bowel, small bowel measures to 3.4 cm, large bowel measures to 7.9 cm. Moderate amount of retained large bowel stool. Surgical clips in the included right abdomen compatible with cholecystectomy. No intra-abdominal mass effect, pathologic calcifications or free air. Soft tissue planes and included osseous structures are non-suspicious. Osteopenia. IMPRESSION: Stable cardiomegaly with small to moderate pleural effusions. Focal consolidation LEFT mid lung zone. Mild ileus and moderate amount of retained large bowel stool. Electronically Signed   By: Elon Alas M.D.   On: 06/15/2018 04:09    ED COURSE and MDM  Nursing notes and initial vitals signs, including pulse oximetry, reviewed.  Vitals:   06/15/18 0600 06/15/18 0613 06/15/18 0615 06/15/18 0630  BP:  (!) 114/91    Pulse: 96 87 69 (!) 122  Resp: (!) 21 (!) 23 20 (!) 21  Temp:      TempSrc:      SpO2: 97% 97% 97% 97%   3:04 AM Although the patient was afebrile with normal blood pressure his earlier episode of shaking is  concerning for chills and the sepsis protocol was initiated.  His  first lactic acid was 4.27 and he was started on a normal saline fluid bolus.  4:22 AM Lactate improved after initial bolus.  Additional normal saline bolus ordered.  6:02 AM Discussed with Dr. Alcario Drought of hospitalist service.  Given patient's abnormal abdominal films and lack of bowel movement will obtain a CT scan of the abdomen and pelvis.  Third liter of normal saline infusing.  7:28 AM Rocephin and Zithromax ordered for possible pneumonia.  We will have the patient admitted for this and his bowel issues.   7:44 AM Dr. Wynelle Cleveland to admit.  PROCEDURES   CRITICAL CARE Performed by: Shanon Rosser L Total critical care time: 35 minutes Critical care time was exclusive of separately billable procedures and treating other patients. Critical care was necessary to treat or prevent imminent or life-threatening deterioration. Critical care was time spent personally by me on the following activities: development of treatment plan with patient and/or surrogate as well as nursing, discussions with consultants, evaluation of patient's response to treatment, examination of patient, obtaining history from patient or surrogate, ordering and performing treatments and interventions, ordering and review of laboratory studies, ordering and review of radiographic studies, pulse oximetry and re-evaluation of patient's condition.   ED DIAGNOSES     ICD-10-CM   1. Ileus ([place]) K56.7   2. Community acquired pneumonia of left lower lobe of lung ([place]) J18.1   3. Community acquired pneumonia of right lower lobe of lung ([place]) J18.1   4. Paroxysmal atrial fibrillation ([place]) I48.0        Param Capri, MD 06/15/18 0746    Shanon Rosser, MD 06/15/18 [phone]

## 2018-06-15 NOTE — ED Notes (Addendum)
Attempted to give report to Ljep,RN will call back in a few minutes.

## 2018-06-15 NOTE — Progress Notes (Signed)
[facility]  Patient Status: Active (receiving services up to time of hospitalization)  [facility] is providing the following services: RN and HHA  If patient discharges after hours, please call [phone].   [name] [name] 06/15/2018, 12:53 PM

## 2018-06-15 NOTE — ED Notes (Signed)
Report given to Lepa,RN.

## 2018-06-15 NOTE — H&P (Signed)
History and Physical    [name] [name]  EYC:[id number]  DOB: [birth year]  DOA: 06/15/2018 PCP: Cassandria Anger, MD   Patient coming from: home  Chief Complaint: chills and cough  HPI: [name] [name] is a 84 y.o. male with medical history of PAF s/p TEE with cardioversion, cardiomyopathy with EF of 20%, HTN, CKD3, BPH who presents for chills and cough. He was sitting in his sunroom yesterday evening when he began to "shake like a leaf". He did not have any other symptoms and the shaking went away after a while but then recurred. He called his son who ignored his symptoms. Soon after he began to have a hoarse cough and his wife decided to call EMS. His cough is congested but not severe. His shaking has stopped for now.  No h/o choking on his food. He is also severely constipated which is not a new issue but is has not resolved after a Fleets enema at home. He eats normally and has lost about 10 lb in the past few months.  He has been taking his medications as ordered.   He did not urinate last night and he typically goes 3-4 times at night.  He was last admitted to the hospital from 03/30/18 -04/04/18 for acute CHF and A-fib with RVR and was DCCV on 5/15.   ED Course:  Pulse ox 88% on room air, now 97% on 2 L WBC count 13.2, Hb 17.5, Plt 120 BUN 33, Cr 2.12 LA 4.27 improved to 2.71   Review of Systems:  All other systems reviewed and apart from HPI, are negative.  Past Medical History:  Diagnosis Date  . Anxiety   . Arthritis    "shoulders" (05/28/2016)  . Cardiomyopathy   . Coronary artery disease   . Depression    hx (05/28/2016)  . Hemorrhoids   . Hyperlipidemia   . Hypertension   . Paroxysmal atrial fibrillation (HCC)   . Skipped heart beats     Past Surgical History:  Procedure Laterality Date  . APPENDECTOMY    . CARDIOVERSION N/A 06/04/2016   Procedure: CARDIOVERSION;  Surgeon: Pixie Casino, MD;  Location: [facility] ENDOSCOPY;  Service: Cardiovascular;  Laterality: N/A;    . CARDIOVERSION N/A 04/09/2017   Procedure: CARDIOVERSION;  Surgeon: Thayer Headings, MD;  Location: [facility] ENDOSCOPY;  Service: Cardiovascular;  Laterality: N/A;  . CARDIOVERSION N/A 04/01/2018   Procedure: CARDIOVERSION;  Surgeon: Skeet Latch, MD;  Location: [place];  Service: Cardiovascular;  Laterality: N/A;  . CATARACT EXTRACTION Left   . CHOLECYSTECTOMY OPEN    . COLONOSCOPY W/ BIOPSIES AND POLYPECTOMY    . INGUINAL HERNIA REPAIR Bilateral   . MYRINGOTOMY WITH TUBE PLACEMENT Bilateral   . skin cancer removal Right    side of nose by Rt eye  . TEE WITHOUT CARDIOVERSION N/A 06/04/2016   Procedure: TRANSESOPHAGEAL ECHOCARDIOGRAM (TEE);  Surgeon: Pixie Casino, MD;  Location: [facility] ENDOSCOPY;  Service: Cardiovascular;  Laterality: N/A;  . TEE WITHOUT CARDIOVERSION N/A 04/01/2018   Procedure: TRANSESOPHAGEAL ECHOCARDIOGRAM (TEE);  Surgeon: Skeet Latch, MD;  Location: [place];  Service: Cardiovascular;  Laterality: N/A;  . TONSILLECTOMY AND ADENOIDECTOMY      Social History:   reports that he has never smoked. He has never used smokeless tobacco. He reports that he does not drink alcohol or use drugs.  Lives with his wife, uses a walker.  Allergies  Allergen Reactions  . Lisinopril Cough    Family History  Problem Relation Age of  Onset  . Heart disease Brother      Prior to Admission medications   Medication Sig Start Date End Date Taking? Authorizing Provider  ALPRAZolam Duanne Moron) 1 MG tablet Take 1 tablet (1 mg total) by mouth at bedtime. 04/22/18  Yes Plotnikov, Evie Lacks, MD  amiodarone (PACERONE) 200 MG tablet TAKE 1 TABLET ONCE DAILY. 05/05/18  Yes Plotnikov, Evie Lacks, MD  apixaban (ELIQUIS) 2.5 MG TABS tablet Take 1 tablet (2.5 mg total) by mouth 2 (two) times daily. 01/14/18  Yes Minus Breeding, MD  benzonatate (TESSALON) 100 MG capsule Take 1 capsule (100 mg total) by mouth 3 (three) times daily as needed. Patient taking differently: Take 100 mg by mouth 3  (three) times daily as needed for cough.  03/11/18  Yes Marrian Salvage, FNP  finasteride (PROSCAR) 5 MG tablet Take 1 tablet (5 mg total) by mouth daily. 10/24/17  Yes Plotnikov, Evie Lacks, MD  linaclotide (LINZESS) 290 MCG CAPS capsule Take 1 capsule (290 mcg total) by mouth daily before breakfast. Patient taking differently: Take 28 mcg by mouth daily as needed (constipation).  06/30/17  Yes Pyrtle, Lajuan Lines, MD  polyethylene glycol (MIRALAX / GLYCOLAX) packet Take 17 g by mouth at bedtime.   Yes [provider]  polyethylene glycol-electrolytes (NULYTELY/GOLYTELY) 420 g solution Use 200 ml 4 times a day prn constipation in addition to Miralax (17 g bid) and Linzess 05/29/18  Yes Plotnikov, Evie Lacks, MD  pravastatin (PRAVACHOL) 20 MG tablet TAKE 1 TABLET EACH DAY. 04/09/18  Yes Plotnikov, Evie Lacks, MD  ranitidine (ZANTAC) 150 MG tablet Take 1 tablet (150 mg total) by mouth 2 (two) times daily as needed for heartburn. 10/24/17  Yes Plotnikov, Evie Lacks, MD  senna (SENOKOT) 8.6 MG TABS tablet Take 1-2 tablets (8.6-17.2 mg total) by mouth at bedtime. 12/23/17  Yes Plotnikov, Evie Lacks, MD  traMADol (ULTRAM) 50 MG tablet Take 0.5-1 tablets (25-50 mg total) by mouth every 6 (six) hours as needed for severe pain. 05/06/18  Yes Plotnikov, Evie Lacks, MD  VENTOLIN HFA 108 (90 Base) MCG/ACT inhaler Inhale 2 puffs into the lungs every 6 (six) hours as needed for wheezing or shortness of breath.  03/19/18  Yes [provider]  furosemide (LASIX) 40 MG tablet Take 2 tablets (80 mg total) by mouth every morning. 06/10/18   Erlene Quan, PA-C    Physical Exam: Wt Readings from Last 3 Encounters:  06/10/18 78 kg (172 lb)  05/29/18 78.5 kg (173 lb)  05/11/18 80.6 kg (177 lb 9.6 oz)   Vitals:   06/15/18 0613 06/15/18 0615 06/15/18 0630 06/15/18 0749  BP: (!) 114/91   106/79  Pulse: 87 69 (!) 122 (!) 106  Resp: (!) 23 20 (!) 21 (!) 22  Temp:      TempSrc:      SpO2: 97% 97% 97% 98%       Constitutional:  Calm & comfortable Eyes: PERRLA, lids and conjunctivae normal ENT:  Mucous membranes are moist.  Pharynx clear of exudate   Normal dentition.  Neck: Supple, no masses  Respiratory:  Faint crackles at bases- pulse ox is 98% on 2 L, he is breathing comfortably and is speaking in full sentences. Cardiovascular:  S1 & S2 heard, IIRR- HR in low 100s at rest No Murmurs Abdomen:  mildly distended No tenderness, No masses Bowel sounds normal Extremities:  No clubbing / cyanosis 3+ pedal edema No joint deformity   -very long toe nails Skin:  No  rashes, lesions or ulcers Neurologic:  AAO x 3 CN 2-12 grossly intact Sensation intact Strength 5/5 in all 4 extremities Psychiatric:  Normal Mood and affect    Labs on Admission: I have personally reviewed following labs and imaging studies  CBC: Recent Labs  Lab 06/15/18 0151  WBC 13.2*  NEUTROABS 11.8*  HGB 17.5*  HCT 51.6  MCV 95.9  PLT 259*   Basic Metabolic Panel: Recent Labs  Lab 06/15/18 0151  NA 141  K 4.6  CL 102  CO2 27  GLUCOSE 181*  BUN 33*  CREATININE 2.12*  CALCIUM 8.1*   GFR: Estimated Creatinine Clearance: 20 mL/min (A) (by C-G formula based on SCr of 2.12 mg/dL (H)). Liver Function Tests: No results for input(s): AST, ALT, ALKPHOS, BILITOT, PROT, ALBUMIN in the last 168 hours. No results for input(s): LIPASE, AMYLASE in the last 168 hours. No results for input(s): AMMONIA in the last 168 hours. Coagulation Profile: No results for input(s): INR, PROTIME in the last 168 hours. Cardiac Enzymes: No results for input(s): CKTOTAL, CKMB, CKMBINDEX, TROPONINI in the last 168 hours. BNP (last 3 results) No results for input(s): PROBNP in the last 8760 hours. HbA1C: No results for input(s): HGBA1C in the last 72 hours. CBG: No results for input(s): GLUCAP in the last 168 hours. Lipid Profile: No results for input(s): CHOL, HDL, LDLCALC, TRIG, CHOLHDL, LDLDIRECT in the last 72  hours. Thyroid Function Tests: No results for input(s): TSH, T4TOTAL, FREET4, T3FREE, THYROIDAB in the last 72 hours. Anemia Panel: No results for input(s): VITAMINB12, FOLATE, FERRITIN, TIBC, IRON, RETICCTPCT in the last 72 hours. Urine analysis:    Component Value Date/Time   COLORURINE YELLOW 06/15/2018 0459   APPEARANCEUR CLEAR 06/15/2018 0459   LABSPEC 1.012 06/15/2018 0459   PHURINE 6.0 06/15/2018 0459   GLUCOSEU NEGATIVE 06/15/2018 0459   GLUCOSEU NEGATIVE 10/23/2017 1245   HGBUR NEGATIVE 06/15/2018 0459   BILIRUBINUR NEGATIVE 06/15/2018 0459   KETONESUR NEGATIVE 06/15/2018 0459   PROTEINUR NEGATIVE 06/15/2018 0459   UROBILINOGEN 0.2 10/23/2017 1245   NITRITE NEGATIVE 06/15/2018 0459   LEUKOCYTESUR NEGATIVE 06/15/2018 0459   Sepsis Labs: @LABRCNTIP (procalcitonin:4,lacticidven:4) )No results found for this or any previous visit (from the past 240 hour(s)).   Radiological Exams on Admission: Ct Abdomen Pelvis Wo Contrast  Result Date: 06/15/2018 CLINICAL DATA:  84 year old male with constipation for 3 days. No relief with over the counter stool softeners. Post bilateral inguinal hernia repair, cholecystectomy and appendectomy. Subsequent encounter. EXAM: CT ABDOMEN AND PELVIS WITHOUT CONTRAST TECHNIQUE: Multidetector CT imaging of the abdomen and pelvis was performed following the standard protocol without IV contrast. COMPARISON:  08/16/2018 plain film exam.  04/06/2017 CT. FINDINGS: Lower chest: Bilateral lower lobe and inferior lingula consolidation may represent infiltrates and/or atelectasis. Associated pleural effusion greater on the right. Small amount of gas within the non dependent aspect of the right atrium and right ventricle. Question if this is related to IV line. Heart slightly enlarged. Hepatobiliary: Taking into account limitation by non contrast imaging, no worrisome hepatic lesion. Post cholecystectomy. Pancreas: Taking into account limitation by non contrast  imaging, no worrisome pancreatic lesion or inflammation. Spleen: Taking into account limitation by non contrast imaging, no splenic mass or enlargement. Adrenals/Urinary Tract: Chronic left hydronephrosis with ureteral pelvic junction obstruction pattern unchanged. No right-sided hydronephrosis. Bilateral renal cysts some of which are minimally complex. Taking into account limitation by non contrast imaging, no worrisome renal or adrenal lesion. Noncontrast filled imaging of the urinary bladder unremarkable. Stomach/Bowel:  Cecum and appendix extend into right inguinal canal. The terminal ileum is just above the herniated bowel. Fluid and gas-filled top-normal size small bowel loops without evidence of obstruction. If there were further herniation of colon into the right inguinal canal, it is possible this could cause small-bowel obstruction. Stool and gas throughout the colon without obstruction noted. Stomach unremarkable. Vascular/Lymphatic: Atherosclerotic changes aorta and aortic branch vessels. No abdominal aortic aneurysm. Scattered normal size lymph nodes. Reproductive: No worrisome abnormality. Other: No free intraperitoneal air.  No drainable fluid collection. Musculoskeletal: Degenerative changes throughout the lower thoracic and lumbar spine most notable L4-5 level. Hip joint degenerative changes. Sacroiliac joint degenerative changes. IMPRESSION: 1. Small amount of gas within the non dependent aspect of the right atrium and right ventricle. Question if this is related to IV line. 2. Cecum and appendix extend into right inguinal canal. The terminal ileum is just above the herniated bowel. Fluid and gas-filled top-normal size small bowel loops without evidence of obstruction. If there were further herniation of colon into the right inguinal canal, it is possible this could cause small-bowel obstruction. 3. Stool and gas throughout the colon without obstruction noted. 4. Bilateral lower lobe and inferior  lingula consolidation may represent infiltrates and/or atelectasis. Associated pleural effusion greater on the right. 5. Chronic left hydronephrosis with ureteral pelvic junction obstruction pattern unchanged. Bilateral renal cysts similar to prior exam. 6.  Aortic Atherosclerosis (ICD10-I70.0). These results will be called to the ordering clinician or representative by the Radiologist Assistant, and communication documented in the PACS or zVision Dashboard. Electronically Signed   By: Genia Del M.D.   On: 06/15/2018 07:24   Dg Abd Acute W/chest  Result Date: 06/15/2018 CLINICAL DATA:  Constipation for 3 days. EXAM: DG ABDOMEN ACUTE W/ 1V CHEST COMPARISON:  Chest radiograph Mar 30, 2018 FINDINGS: Stable cardiomegaly. Calcified aortic knob. Small to moderate pleural layering pleural effusions, decreased on the RIGHT. Patchy LEFT mid lung zone airspace opacity. No pneumothorax. Osteopenia. Severe degenerative change of the shoulders. Old RIGHT rib fractures. Gas-filled mildly distended small large bowel, small bowel measures to 3.4 cm, large bowel measures to 7.9 cm. Moderate amount of retained large bowel stool. Surgical clips in the included right abdomen compatible with cholecystectomy. No intra-abdominal mass effect, pathologic calcifications or free air. Soft tissue planes and included osseous structures are non-suspicious. Osteopenia. IMPRESSION: Stable cardiomegaly with small to moderate pleural effusions. Focal consolidation LEFT mid lung zone. Mild ileus and moderate amount of retained large bowel stool. Electronically Signed   By: Elon Alas M.D.   On: 06/15/2018 04:09    EKG: Independently reviewed. A-fib @107  bpm, RBB, LAFB  Assessment/Plan Principal Problem:   CAP (community acquired pneumonia)/   Acute respiratory failure with hypoxia  - CT chest> Bilateral lower lobe and inferior lingula consolidation may represent infiltrates and/or atelectasis. Associated pleural effusion  greater on the right. - his is very comfortable on 2 L O2 - received Rocephin and Zithromax which we can continue- does not sound to be aspiration per history - add Mucinex and Xopenex PRN - Wean O2 as able  Active Problems: Sepsis - tachycardia, leukocytosis in setting of pneumonia  Lactic acidosis - due to infection in setting of already poor cardiac output - will recheck LA  EF 20%, cardiomyopathy -  follows with Dr Percival Spanish  - mod pleural effusions on imaging and significant pedal edema - has received 3 L NS in the ED and so I  will give 20 mg IV Lasix  and then resume his home dose of Lasix (to be given today) - no respiratory distress but is fluid overloaded  - as his sepsis improves, we can diurese him more aggressively - not on ACE/ARB due to CKD 3b   Mild AKI- CKD (chronic kidney disease) stage 3, GFR 30-59 ml/min   - has received a good amount of fluids  - check Bmet    PAF (paroxysmal atrial fibrillation) s/p DCCV - cont Amio and Eliquis - rate controlled  Chronic thrombocytopenia - noted on labs   Constipation - no obstruction noted on CT but he has a herniated cecum and appendix into his right inguinal canal - on Linzess, Miralax, Senna & PRN Go-lytely per med list - give soap suds enema  Chronic left hydronephrosis with UPJ obstruction Bilateral renal cysts - stable  BPH - cont Proscar  HLD - Proscar  GERD - Zantac   NOTE: "Small amount of gas within the non dependent aspect of the right atrium and right ventricle." on CT may be related to IV  DVT prophylaxis: Eliquis  Code Status: DNR  Family Communication: wife at bedside  Disposition Plan: telemetry  Consults called: none  Admission status: inpatient    Debbe Odea MD [facility] Pager: [email or web address] Password [email or web address] [phone], please contact night-coverage   06/15/2018, 8:38 AM

## 2018-06-15 NOTE — ED Notes (Signed)
ED TO INPATIENT HANDOFF REPORT  Name/Age/Gender [name] [name] 84 y.o. male  Code Status    Code Status Orders  (From admission, onward)        Start     Ordered   06/15/18 0804  Do not attempt resuscitation (DNR)  Continuous    Question Answer Comment  In the event of cardiac or respiratory ARREST Do not call a "code blue"   In the event of cardiac or respiratory ARREST Do not perform Intubation, CPR, defibrillation or ACLS   In the event of cardiac or respiratory ARREST Use medication by any route, position, wound care, and other measures to relive pain and suffering. May use oxygen, suction and manual treatment of airway obstruction as needed for comfort.      06/15/18 0804    Code Status History    Date Active Date Inactive Code Status Order ID Comments User Context   03/30/2018 2225 04/04/2018 1913 Full Code [id number]  Bennie Pierini, MD Inpatient   04/06/2017 1842 04/14/2017 1819 Full Code [id number]  Jonetta Osgood, MD Inpatient   08/22/2016 1801 08/23/2016 1802 Full Code [id number]  Louellen Molder, MD Inpatient   08/22/2016 1651 08/22/2016 1801 Full Code [id number]  Louellen Molder, MD ED   06/03/2016 2019 06/05/2016 1823 Full Code [id number]  Schorr, Rhetta Mura, NP Inpatient   05/28/2016 1514 06/03/2016 2019 DNR [id number]  Samella Parr, NP Inpatient      Home/SNF/Other Home  Chief Complaint Constipation  Level of Care/Admitting Diagnosis ED Disposition    ED Disposition Condition [facility]: [facility] [[id number]]  Level of Care: Telemetry [5]  Admit to tele based on following criteria: Complex arrhythmia (Bradycardia/Tachycardia)  Diagnosis: CAP (community acquired pneumonia) [[id number]]  Admitting Physician: MacArthur, Forreston  Attending Physician: Debbe Odea [[id number]]  Estimated length of stay: past midnight tomorrow  Certification:: I certify this patient will need inpatient services for at least 2 midnights  PT Class  (Do Not Modify): Inpatient [101]  PT Acc Code (Do Not Modify): Private [1]       Medical History Past Medical History:  Diagnosis Date  . Anxiety   . Arthritis    "shoulders" (05/28/2016)  . Cardiomyopathy   . Coronary artery disease   . Depression    hx (05/28/2016)  . Hemorrhoids   . Hyperlipidemia   . Hypertension   . Paroxysmal atrial fibrillation (HCC)   . Skipped heart beats     Allergies Allergies  Allergen Reactions  . Lisinopril Cough    IV Location/Drains/Wounds Patient Lines/Drains/Airways Status   Active Line/Drains/Airways    Name:   Placement date:   Placement time:   Site:   Days:   Peripheral IV 03/30/18 Left Forearm   03/30/18    1600    Forearm   77   Peripheral IV 03/30/18 Right Forearm   03/30/18    1610    Forearm   77   Peripheral IV 06/15/18 Left;Posterior Forearm   06/15/18    0230    Forearm   less than 1   Peripheral IV 06/15/18 Right;Anterior Forearm   06/15/18    0220    Forearm   less than 1   External Urinary Catheter   04/12/17    1100    -   429   External Urinary Catheter   04/02/18    0815    -   74   Wound 10/13/12 Abrasion(s) Face Left  bleeding controlled, red   10/13/12    1147    Face   2071   Wound 10/13/12 Skin tear Wrist Left bleeding controlled, red, purple, black   10/13/12    1148    Wrist   2071   Wound 10/13/12 Abrasion(s) Knee Left bleeding controlled, red   10/13/12    1149    Knee   2071   Wound / Incision (Open or Dehisced) 05/28/16 Laceration Head Upper;Mid jagged   05/28/16    1130    Head   748   Wound / Incision (Open or Dehisced) 05/30/16 Laceration Arm Right   05/30/16    2115    Arm   746   Wound / Incision (Open or Dehisced) 08/22/16 Laceration Head Posterior Old blood with 2 staples from ED   08/22/16    1759    Head   662          Labs/Imaging Results for orders placed or performed during the hospital encounter of 06/15/18 (from the past 48 hour(s))  CBC with Differential/Platelet     Status: Abnormal    Collection Time: 06/15/18  1:51 AM  Result Value Ref Range   WBC 13.2 (H) 4.0 - 10.5 K/uL   RBC 5.38 4.22 - 5.81 MIL/uL   Hemoglobin 17.5 (H) 13.0 - 17.0 g/dL   HCT 51.6 39.0 - 52.0 %   MCV 95.9 78.0 - 100.0 fL   MCH 32.5 26.0 - 34.0 pg   MCHC 33.9 30.0 - 36.0 g/dL   RDW 16.0 (H) 11.5 - 15.5 %   Platelets 120 (L) 150 - 400 K/uL   Neutrophils Relative % 90 %   Neutro Abs 11.8 (H) 1.7 - 7.7 K/uL   Lymphocytes Relative 2 %   Lymphs Abs 0.3 (L) 0.7 - 4.0 K/uL   Monocytes Relative 8 %   Monocytes Absolute 1.0 0.1 - 1.0 K/uL   Eosinophils Relative 0 %   Eosinophils Absolute 0.0 0.0 - 0.7 K/uL   Basophils Relative 0 %   Basophils Absolute 0.0 0.0 - 0.1 K/uL    Comment: Performed at [facility], [place] [address] [place], [place] [address]  Basic metabolic panel     Status: Abnormal   Collection Time: 06/15/18  1:51 AM  Result Value Ref Range   Sodium 141 135 - 145 mmol/L   Potassium 4.6 3.5 - 5.1 mmol/L   Chloride 102 98 - 111 mmol/L   CO2 27 22 - 32 mmol/L   Glucose, Bld 181 (H) 70 - 99 mg/dL   BUN 33 (H) 8 - 23 mg/dL   Creatinine, Ser 2.12 (H) 0.61 - 1.24 mg/dL   Calcium 8.1 (L) 8.9 - 10.3 mg/dL   GFR calc non Af Amer 25 (L) >60 mL/min   GFR calc Af Amer 29 (L) >60 mL/min    Comment: (NOTE) The eGFR has been calculated using the CKD EPI equation. This calculation has not been validated in all clinical situations. eGFR's persistently <60 mL/min signify possible Chronic Kidney Disease.    Anion gap 12 5 - 15    Comment: Performed at [facility], [place] [address], [place], [place] [address]  POC occult blood, ED     Status: None   Collection Time: 06/15/18  1:54 AM  Result Value Ref Range   Fecal Occult Bld NEGATIVE NEGATIVE  I-Stat CG4 Lactic Acid, ED     Status: Abnormal   Collection Time: 06/15/18  2:38  AM  Result Value Ref Range   Lactic Acid, Venous 4.27 (HH) 0.5 - 1.9 mmol/L   Comment NOTIFIED PHYSICIAN   I-Stat CG4 Lactic Acid, ED      Status: Abnormal   Collection Time: 06/15/18  4:01 AM  Result Value Ref Range   Lactic Acid, Venous 2.71 (HH) 0.5 - 1.9 mmol/L   Comment NOTIFIED PHYSICIAN   Urinalysis, Routine w reflex microscopic     Status: None   Collection Time: 06/15/18  4:59 AM  Result Value Ref Range   Color, Urine YELLOW YELLOW   APPearance CLEAR CLEAR   Specific Gravity, Urine 1.012 1.005 - 1.030   pH 6.0 5.0 - 8.0   Glucose, UA NEGATIVE NEGATIVE mg/dL   Hgb urine dipstick NEGATIVE NEGATIVE   Bilirubin Urine NEGATIVE NEGATIVE   Ketones, ur NEGATIVE NEGATIVE mg/dL   Protein, ur NEGATIVE NEGATIVE mg/dL   Nitrite NEGATIVE NEGATIVE   Leukocytes, UA NEGATIVE NEGATIVE    Comment: Performed at [facility], [place] [address], [place], [place] [address]   Ct Abdomen Pelvis Wo Contrast  Result Date: 06/15/2018 CLINICAL DATA:  84 year old male with constipation for 3 days. No relief with over the counter stool softeners. Post bilateral inguinal hernia repair, cholecystectomy and appendectomy. Subsequent encounter. EXAM: CT ABDOMEN AND PELVIS WITHOUT CONTRAST TECHNIQUE: Multidetector CT imaging of the abdomen and pelvis was performed following the standard protocol without IV contrast. COMPARISON:  08/16/2018 plain film exam.  04/06/2017 CT. FINDINGS: Lower chest: Bilateral lower lobe and inferior lingula consolidation may represent infiltrates and/or atelectasis. Associated pleural effusion greater on the right. Small amount of gas within the non dependent aspect of the right atrium and right ventricle. Question if this is related to IV line. Heart slightly enlarged. Hepatobiliary: Taking into account limitation by non contrast imaging, no worrisome hepatic lesion. Post cholecystectomy. Pancreas: Taking into account limitation by non contrast imaging, no worrisome pancreatic lesion or inflammation. Spleen: Taking into account limitation by non contrast imaging, no splenic mass or enlargement.  Adrenals/Urinary Tract: Chronic left hydronephrosis with ureteral pelvic junction obstruction pattern unchanged. No right-sided hydronephrosis. Bilateral renal cysts some of which are minimally complex. Taking into account limitation by non contrast imaging, no worrisome renal or adrenal lesion. Noncontrast filled imaging of the urinary bladder unremarkable. Stomach/Bowel: Cecum and appendix extend into right inguinal canal. The terminal ileum is just above the herniated bowel. Fluid and gas-filled top-normal size small bowel loops without evidence of obstruction. If there were further herniation of colon into the right inguinal canal, it is possible this could cause small-bowel obstruction. Stool and gas throughout the colon without obstruction noted. Stomach unremarkable. Vascular/Lymphatic: Atherosclerotic changes aorta and aortic branch vessels. No abdominal aortic aneurysm. Scattered normal size lymph nodes. Reproductive: No worrisome abnormality. Other: No free intraperitoneal air.  No drainable fluid collection. Musculoskeletal: Degenerative changes throughout the lower thoracic and lumbar spine most notable L4-5 level. Hip joint degenerative changes. Sacroiliac joint degenerative changes. IMPRESSION: 1. Small amount of gas within the non dependent aspect of the right atrium and right ventricle. Question if this is related to IV line. 2. Cecum and appendix extend into right inguinal canal. The terminal ileum is just above the herniated bowel. Fluid and gas-filled top-normal size small bowel loops without evidence of obstruction. If there were further herniation of colon into the right inguinal canal, it is possible this could cause small-bowel obstruction. 3. Stool and gas throughout the colon without obstruction noted. 4. Bilateral lower lobe and inferior lingula  consolidation may represent infiltrates and/or atelectasis. Associated pleural effusion greater on the right. 5. Chronic left hydronephrosis with  ureteral pelvic junction obstruction pattern unchanged. Bilateral renal cysts similar to prior exam. 6.  Aortic Atherosclerosis (ICD10-I70.0). These results will be called to the ordering clinician or representative by the Radiologist Assistant, and communication documented in the PACS or zVision Dashboard. Electronically Signed   By: Genia Del M.D.   On: 06/15/2018 07:24   Dg Abd Acute W/chest  Result Date: 06/15/2018 CLINICAL DATA:  Constipation for 3 days. EXAM: DG ABDOMEN ACUTE W/ 1V CHEST COMPARISON:  Chest radiograph Mar 30, 2018 FINDINGS: Stable cardiomegaly. Calcified aortic knob. Small to moderate pleural layering pleural effusions, decreased on the RIGHT. Patchy LEFT mid lung zone airspace opacity. No pneumothorax. Osteopenia. Severe degenerative change of the shoulders. Old RIGHT rib fractures. Gas-filled mildly distended small large bowel, small bowel measures to 3.4 cm, large bowel measures to 7.9 cm. Moderate amount of retained large bowel stool. Surgical clips in the included right abdomen compatible with cholecystectomy. No intra-abdominal mass effect, pathologic calcifications or free air. Soft tissue planes and included osseous structures are non-suspicious. Osteopenia. IMPRESSION: Stable cardiomegaly with small to moderate pleural effusions. Focal consolidation LEFT mid lung zone. Mild ileus and moderate amount of retained large bowel stool. Electronically Signed   By: Elon Alas M.D.   On: 06/15/2018 04:09    Pending Labs Unresulted Labs (From admission, onward)   Start     Ordered   06/16/18 4496  Basic metabolic panel  Tomorrow morning,   R     06/15/18 0804   06/16/18 0500  CBC  Tomorrow morning,   R     06/15/18 0804   06/15/18 0151  Blood culture (routine x 2)  BLOOD CULTURE X 2,   STAT     06/15/18 0151      Vitals/Pain Today's Vitals   06/15/18 0613 06/15/18 0615 06/15/18 0630 06/15/18 0749  BP: (!) 114/91   106/79  Pulse: 87 69 (!) 122 (!) 106  Resp: (!)  23 20 (!) 21 (!) 22  Temp:      TempSrc:      SpO2: 97% 97% 97% 98%  PainSc:        Isolation Precautions No active isolations  Medications Medications  cefTRIAXone (ROCEPHIN) 1 g in sodium chloride 0.9 % 100 mL IVPB (1 g Intravenous New Bag/Given 06/15/18 0749)  azithromycin (ZITHROMAX) 500 mg in sodium chloride 0.9 % 250 mL IVPB (500 mg Intravenous New Bag/Given 06/15/18 0751)  sodium chloride flush (NS) 0.9 % injection 3 mL (has no administration in time range)  sodium chloride flush (NS) 0.9 % injection 3 mL (has no administration in time range)  0.9 %  sodium chloride infusion (has no administration in time range)  acetaminophen (TYLENOL) tablet 650 mg (has no administration in time range)    Or  acetaminophen (TYLENOL) suppository 650 mg (has no administration in time range)  ondansetron (ZOFRAN) tablet 4 mg (has no administration in time range)    Or  ondansetron (ZOFRAN) injection 4 mg (has no administration in time range)  furosemide (LASIX) injection 20 mg (has no administration in time range)  ALPRAZolam (XANAX) tablet 1 mg (has no administration in time range)  amiodarone (PACERONE) tablet 200 mg (has no administration in time range)  apixaban (ELIQUIS) tablet 2.5 mg (has no administration in time range)  benzonatate (TESSALON) capsule 100 mg (has no administration in time range)  finasteride (PROSCAR) tablet 5  mg (has no administration in time range)  furosemide (LASIX) tablet 80 mg (has no administration in time range)  linaclotide (LINZESS) capsule 290 mcg (has no administration in time range)  pravastatin (PRAVACHOL) tablet 20 mg (has no administration in time range)  famotidine (PEPCID) tablet 20 mg (has no administration in time range)  senna (SENOKOT) tablet 8.6-17.2 mg (has no administration in time range)  guaiFENesin (MUCINEX) 12 hr tablet 600 mg (has no administration in time range)  levalbuterol (XOPENEX) nebulizer solution 0.63 mg (has no administration in  time range)  traMADol (ULTRAM) tablet 25 mg (has no administration in time range)  sodium chloride 0.9 % bolus 1,000 mL (0 mLs Intravenous Stopped 06/15/18 0406)  sodium chloride 0.9 % bolus 1,000 mL (0 mLs Intravenous Stopped 06/15/18 0600)  sodium chloride 0.9 % bolus 1,000 mL (0 mLs Intravenous Stopped 06/15/18 0748)    Mobility non-ambulatory

## 2018-06-15 NOTE — ED Notes (Signed)
Bed: JE83 Expected date:  Expected time:  Means of arrival:  Comments: EMS 84 yo male constipation x 3 days-from home 156/90 HR 80

## 2018-06-15 NOTE — ED Notes (Signed)
Dr. Genia Del. First impression is small amount of gas in the non dependent aspect of the right atrium and right aspect. Question is this is related to IV line?

## 2018-06-15 NOTE — ED Triage Notes (Signed)
Per EMS, pt. From home with complaint of constipation x 3 days, pt. Stated that he was taking OTC stool softener but not effective, denied pain , denied N/V. Per EMS, pt. Was ambulatory at home with walker , ,A/O x3.

## 2018-06-16 DIAGNOSIS — A419 Sepsis, unspecified organism: Secondary | ICD-10-CM

## 2018-06-16 DIAGNOSIS — K5900 Constipation, unspecified: Secondary | ICD-10-CM

## 2018-06-16 LAB — BASIC METABOLIC PANEL
ANION GAP: 8 (ref 5–15)
BUN: 33 mg/dL — ABNORMAL HIGH (ref 8–23)
CHLORIDE: 105 mmol/L (ref 98–111)
CO2: 29 mmol/L (ref 22–32)
Calcium: 8 mg/dL — ABNORMAL LOW (ref 8.9–10.3)
Creatinine, Ser: 1.74 mg/dL — ABNORMAL HIGH (ref 0.61–1.24)
GFR calc non Af Amer: 31 mL/min — ABNORMAL LOW (ref >60)
GFR, EST AFRICAN AMERICAN: 36 mL/min — AB (ref >60)
Glucose, Bld: 106 mg/dL — ABNORMAL HIGH (ref 70–99)
POTASSIUM: 3.8 mmol/L (ref 3.5–5.1)
Sodium: 142 mmol/L (ref 135–145)

## 2018-06-16 LAB — CBC
HEMATOCRIT: 46.3 % (ref 39.0–52.0)
HEMOGLOBIN: 15.1 g/dL (ref 13.0–17.0)
MCH: 31.3 pg (ref 26.0–34.0)
MCHC: 32.6 g/dL (ref 30.0–36.0)
MCV: 96.1 fL (ref 78.0–100.0)
Platelets: 104 10*3/uL — ABNORMAL LOW (ref 150–400)
RBC: 4.82 MIL/uL (ref 4.22–5.81)
RDW: 16.2 % — ABNORMAL HIGH (ref 11.5–15.5)
WBC: 8 10*3/uL (ref 4.0–10.5)

## 2018-06-16 LAB — HIV ANTIBODY (ROUTINE TESTING W REFLEX): HIV Screen 4th Generation wRfx: NONREACTIVE

## 2018-06-16 MED ORDER — LEVALBUTEROL HCL 0.63 MG/3ML IN NEBU
0.6300 mg | INHALATION_SOLUTION | RESPIRATORY_TRACT | Status: DC | PRN
Start: 2018-06-16 — End: 2018-06-18

## 2018-06-16 MED ORDER — SODIUM CHLORIDE 0.9 % IV SOLN
INTRAVENOUS | Status: DC | PRN
  Administered 2018-06-16 – 2018-06-17 (×2): 250 mL via INTRAVENOUS

## 2018-06-16 MED ORDER — GUAIFENESIN ER 600 MG PO TB12
1200.0000 mg | ORAL_TABLET | Freq: Two times a day (BID) | ORAL | Status: DC
  Administered 2018-06-16 – 2018-06-18 (×5): 1200 mg via ORAL
  Filled 2018-06-16 (×5): qty 2

## 2018-06-16 MED ORDER — POLYETHYLENE GLYCOL 3350 17 G PO PACK
17.0000 g | PACK | Freq: Every day | ORAL | Status: DC
  Administered 2018-06-16: 17 g via ORAL

## 2018-06-16 MED ORDER — POLYETHYLENE GLYCOL 3350 17 G PO PACK: 17.0000 g | PACK | Freq: Two times a day (BID) | ORAL | Status: DC

## 2018-06-16 NOTE — Care Management Note (Signed)
Case Management Note  Patient Details  Name: [name] [name] MRN: [id number] Date of Birth: 1920/04/12  Subjective/Objective: 84 y/o m admitted w/CAP. From home. Active w/[facility] HHRN/aide rep Santiago Glad aware. PT recc HHPT. Await final [facility] orders-HHRN/PT/aide.                   Action/Plan:d/c home w/HHC.   Expected Discharge Date:  (unknown)               Expected Discharge Plan:  [place]  In-House Referral:     Discharge planning Services  CM Consult  Post Acute Care Choice:  Home Health(Active w/[facility]-RN/aide) Choice offered to:     DME Arranged:    DME Agency:     HH Arranged:    [place] Agency:     Status of Service:  In process, will continue to follow  If discussed at Long Length of Stay Meetings, dates discussed:    Additional Comments:  Dessa Phi, RN 06/16/2018, 11:01 AM

## 2018-06-16 NOTE — Progress Notes (Signed)
PROGRESS NOTE    [name] [name]  NIO:[id number] DOB: 1920/11/06 DOA: 06/15/2018 PCP: Cassandria Anger, MD    Brief Narrative:  Patient is a 84 year old gentleman history of paroxysmal A. fib status post TEE with cardioversion, cardiomyopathy EF 20%, hypertension, chronic kidney disease stage III, BPH presented with chills and cough.  Patient noted to be hypoxic on room air with sats of 88%.  Patient noted to have a white count of 15 and a creatinine of 2.12.  Lactic acid elevated at 4.27.  Acute abdominal series concerning for pneumonia.  Patient admitted and being treated for community-acquired pneumonia.   Assessment & Plan:   Principal Problem:   CAP (community acquired pneumonia) Active Problems:   Essential hypertension   CKD (chronic kidney disease) stage 3, GFR 30-59 ml/min (HCC)   Congestive dilated cardiomyopathy (HCC)   PAF (paroxysmal atrial fibrillation) (HCC)   Acute respiratory failure with hypoxia (HCC)  #1 acute acute respiratory failure with hypoxia felt secondary to community-acquired pneumonia Acute abdominal series concerning for focal consolidation left midlung zone with stable cardiomegaly with small to moderate pleural effusions.  CT abdomen and pelvis with bilateral lower lobe and inferior lingula consolidation may represent infiltrates and/or atelectasis.  Patient noted to have elevated lactic acid level and hypoxic on room air and had presented with chills and cough.  Patient currently afebrile.  Blood cultures pending.  Sputum Gram stain and culture pending.  Continue empiric IV azithromycin IV Rocephin.  Follow.  2.  Sepsis Patient on admission with concerns for sepsis as patient was tachycardic, with a leukocytosis, elevated lactic acid level in the setting of a pneumonia.  Patient currently afebrile.  Lactic acid levels trending down.  Tachycardia improved.  Patient pancultured and results pending.  Leukocytosis improving.  Continue empiric IV Rocephin and  azithromycin.  Follow.  3.  Lactic acidosis Likely secondary to problem #1 and 2.  Lactic acid level trending down.  Continue empiric IV antibiotics.  Follow.  4.  Cardiomyopathy EF 20% Patient noted to have a pedal edema on admission and moderate pleural effusions.  Patient given 3 L normal saline the ED.  Patient received a dose of IV Lasix with clinical improvement.  Continue current home regimen of oral Lasix.  ACE inhibitor/ARB not started due to chronic kidney disease stage IIIb.  5.  Mild acute kidney injury on chronic kidney disease stage III Improved.  6.  Constipation No obstruction noted on CT however noted to have herniated cecum and appendix into his right inguinal canal.  Patient having bowel movements after soapsuds enema yesterday.  Continue current regimen of Linzess, MiraLAX, senna.  7.  BPH Proscar.  8.  Chronic left hydronephrosis with UPJ obstruction/bilateral renal cyst Stable.  9.  Gastro esophageal reflux disease Continue Zantac.    DVT prophylaxis: Eliquis Code Status: DNR Family Communication: Updated patient.  No family present. Disposition Plan: Likely home once clinically stable and medically improved.   Consultants:   None  Procedures:   CT abdomen and pelvis 06/15/2018  Acute abdominal series 06/15/2018  Antimicrobials:   IV Rocephin 06/15/2018  IV azithromycin 06/15/2018   Subjective: Patient sitting up in bed.  Denies any chest pain or shortness of breath.  States he is feeling better than on admission.  Patient stated had a bowel movement today.  Objective: Vitals:   06/15/18 2257 06/16/18 0517 06/16/18 0711 06/16/18 0903  BP: 96/63 104/72  (!) 110/94  Pulse: 63 69  80  Resp:  16  Temp:  (!) 97.5 F (36.4 C)    TempSrc:  Oral    SpO2:  96% 97%   Weight:      Height:        Intake/Output Summary (Last 24 hours) at 06/16/2018 0951 Last data filed at 06/16/2018 0600 Gross per 24 hour  Intake 0 ml  Output 1650 ml  Net  -1650 ml   Filed Weights   06/15/18 0929  Weight: 81.3 kg (179 lb 3.7 oz)    Examination:  General exam: Appears calm and comfortable  Respiratory system: Lungs clear to auscultation bilaterally.  No wheezes, no crackles, no rhonchi.  Normal respiratory effort. Cardiovascular system: S1 & S2 heard, RRR. No JVD, murmurs, rubs, gallops or clicks. No pedal edema. Gastrointestinal system: Abdomen is nondistended, soft and nontender. No organomegaly or masses felt. Normal bowel sounds heard. Central nervous system: Alert and oriented. No focal neurological deficits. Extremities: Symmetric 5 x 5 power. Skin: No rashes, lesions or ulcers Psychiatry: Judgement and insight appear normal. Mood & affect appropriate.     Data Reviewed: I have personally reviewed following labs and imaging studies  CBC: Recent Labs  Lab 06/15/18 0151 06/15/18 1014 06/16/18 0453  WBC 13.2* 15.0* 8.0  NEUTROABS 11.8*  --   --   HGB 17.5* 16.2 15.1  HCT 51.6 48.7 46.3  MCV 95.9 96.8 96.1  PLT 120* 112* 409*   Basic Metabolic Panel: Recent Labs  Lab 06/15/18 0151 06/15/18 1014 06/16/18 0453  NA 141 143 142  K 4.6 3.7 3.8  CL 102 104 105  CO2 27 30 29   GLUCOSE 181* 130* 106*  BUN 33* 31* 33*  CREATININE 2.12* 1.86* 1.74*  CALCIUM 8.1* 7.7* 8.0*   GFR: Estimated Creatinine Clearance: 25.4 mL/min (A) (by C-G formula based on SCr of 1.74 mg/dL (H)). Liver Function Tests: No results for input(s): AST, ALT, ALKPHOS, BILITOT, PROT, ALBUMIN in the last 168 hours. No results for input(s): LIPASE, AMYLASE in the last 168 hours. No results for input(s): AMMONIA in the last 168 hours. Coagulation Profile: No results for input(s): INR, PROTIME in the last 168 hours. Cardiac Enzymes: No results for input(s): CKTOTAL, CKMB, CKMBINDEX, TROPONINI in the last 168 hours. BNP (last 3 results) No results for input(s): PROBNP in the last 8760 hours. HbA1C: No results for input(s): HGBA1C in the last 72  hours. CBG: No results for input(s): GLUCAP in the last 168 hours. Lipid Profile: No results for input(s): CHOL, HDL, LDLCALC, TRIG, CHOLHDL, LDLDIRECT in the last 72 hours. Thyroid Function Tests: No results for input(s): TSH, T4TOTAL, FREET4, T3FREE, THYROIDAB in the last 72 hours. Anemia Panel: No results for input(s): VITAMINB12, FOLATE, FERRITIN, TIBC, IRON, RETICCTPCT in the last 72 hours. Sepsis Labs: Recent Labs  Lab 06/15/18 0238 06/15/18 0401 06/15/18 1014  LATICACIDVEN 4.27* 2.71* 1.6    No results found for this or any previous visit (from the past 240 hour(s)).       Radiology Studies: Ct Abdomen Pelvis Wo Contrast  Result Date: 06/15/2018 CLINICAL DATA:  84 year old male with constipation for 3 days. No relief with over the counter stool softeners. Post bilateral inguinal hernia repair, cholecystectomy and appendectomy. Subsequent encounter. EXAM: CT ABDOMEN AND PELVIS WITHOUT CONTRAST TECHNIQUE: Multidetector CT imaging of the abdomen and pelvis was performed following the standard protocol without IV contrast. COMPARISON:  08/16/2018 plain film exam.  04/06/2017 CT. FINDINGS: Lower chest: Bilateral lower lobe and inferior lingula consolidation may represent infiltrates and/or atelectasis. Associated pleural effusion  greater on the right. Small amount of gas within the non dependent aspect of the right atrium and right ventricle. Question if this is related to IV line. Heart slightly enlarged. Hepatobiliary: Taking into account limitation by non contrast imaging, no worrisome hepatic lesion. Post cholecystectomy. Pancreas: Taking into account limitation by non contrast imaging, no worrisome pancreatic lesion or inflammation. Spleen: Taking into account limitation by non contrast imaging, no splenic mass or enlargement. Adrenals/Urinary Tract: Chronic left hydronephrosis with ureteral pelvic junction obstruction pattern unchanged. No right-sided hydronephrosis. Bilateral  renal cysts some of which are minimally complex. Taking into account limitation by non contrast imaging, no worrisome renal or adrenal lesion. Noncontrast filled imaging of the urinary bladder unremarkable. Stomach/Bowel: Cecum and appendix extend into right inguinal canal. The terminal ileum is just above the herniated bowel. Fluid and gas-filled top-normal size small bowel loops without evidence of obstruction. If there were further herniation of colon into the right inguinal canal, it is possible this could cause small-bowel obstruction. Stool and gas throughout the colon without obstruction noted. Stomach unremarkable. Vascular/Lymphatic: Atherosclerotic changes aorta and aortic branch vessels. No abdominal aortic aneurysm. Scattered normal size lymph nodes. Reproductive: No worrisome abnormality. Other: No free intraperitoneal air.  No drainable fluid collection. Musculoskeletal: Degenerative changes throughout the lower thoracic and lumbar spine most notable L4-5 level. Hip joint degenerative changes. Sacroiliac joint degenerative changes. IMPRESSION: 1. Small amount of gas within the non dependent aspect of the right atrium and right ventricle. Question if this is related to IV line. 2. Cecum and appendix extend into right inguinal canal. The terminal ileum is just above the herniated bowel. Fluid and gas-filled top-normal size small bowel loops without evidence of obstruction. If there were further herniation of colon into the right inguinal canal, it is possible this could cause small-bowel obstruction. 3. Stool and gas throughout the colon without obstruction noted. 4. Bilateral lower lobe and inferior lingula consolidation may represent infiltrates and/or atelectasis. Associated pleural effusion greater on the right. 5. Chronic left hydronephrosis with ureteral pelvic junction obstruction pattern unchanged. Bilateral renal cysts similar to prior exam. 6.  Aortic Atherosclerosis (ICD10-I70.0). These  results will be called to the ordering clinician or representative by the Radiologist Assistant, and communication documented in the PACS or zVision Dashboard. Electronically Signed   By: Genia Del M.D.   On: 06/15/2018 07:24   Dg Abd Acute W/chest  Result Date: 06/15/2018 CLINICAL DATA:  Constipation for 3 days. EXAM: DG ABDOMEN ACUTE W/ 1V CHEST COMPARISON:  Chest radiograph Mar 30, 2018 FINDINGS: Stable cardiomegaly. Calcified aortic knob. Small to moderate pleural layering pleural effusions, decreased on the RIGHT. Patchy LEFT mid lung zone airspace opacity. No pneumothorax. Osteopenia. Severe degenerative change of the shoulders. Old RIGHT rib fractures. Gas-filled mildly distended small large bowel, small bowel measures to 3.4 cm, large bowel measures to 7.9 cm. Moderate amount of retained large bowel stool. Surgical clips in the included right abdomen compatible with cholecystectomy. No intra-abdominal mass effect, pathologic calcifications or free air. Soft tissue planes and included osseous structures are non-suspicious. Osteopenia. IMPRESSION: Stable cardiomegaly with small to moderate pleural effusions. Focal consolidation LEFT mid lung zone. Mild ileus and moderate amount of retained large bowel stool. Electronically Signed   By: Elon Alas M.D.   On: 06/15/2018 04:09        Scheduled Meds: . ALPRAZolam  1 mg Oral QHS  . amiodarone  200 mg Oral Daily  . apixaban  2.5 mg Oral BID  . famotidine  20 mg Oral BID  . finasteride  5 mg Oral Daily  . furosemide  80 mg Oral Daily  . guaiFENesin  1,200 mg Oral BID  . linaclotide  290 mcg Oral QAC breakfast  . polyethylene glycol  17 g Oral Daily  . pravastatin  20 mg Oral Daily  . senna  1-2 tablet Oral QHS  . sodium chloride flush  3 mL Intravenous Q12H   Continuous Infusions: . sodium chloride    . azithromycin    . cefTRIAXone (ROCEPHIN)  IV 1 g (06/16/18 0902)     LOS: 1 day    Time spent: 35 minutes    Irine Seal, MD [facility] Pager [phone]  If 7PM-7AM, please contact night-coverage [email or web address] Password [email or web address] 06/16/2018, 9:51 AM

## 2018-06-17 DIAGNOSIS — I480 Paroxysmal atrial fibrillation: Secondary | ICD-10-CM

## 2018-06-17 DIAGNOSIS — I10 Essential (primary) hypertension: Secondary | ICD-10-CM

## 2018-06-17 DIAGNOSIS — J181 Lobar pneumonia, unspecified organism: Secondary | ICD-10-CM

## 2018-06-17 DIAGNOSIS — N183 Chronic kidney disease, stage 3 (moderate): Secondary | ICD-10-CM

## 2018-06-17 LAB — CBC WITH DIFFERENTIAL/PLATELET
BASOS ABS: 0 10*3/uL (ref 0.0–0.1)
BASOS PCT: 0 %
EOS ABS: 0.2 10*3/uL (ref 0.0–0.7)
Eosinophils Relative: 3 %
HCT: 47.1 % (ref 39.0–52.0)
HEMOGLOBIN: 15.8 g/dL (ref 13.0–17.0)
Lymphocytes Relative: 13 %
Lymphs Abs: 0.8 10*3/uL (ref 0.7–4.0)
MCH: 32.4 pg (ref 26.0–34.0)
MCHC: 33.5 g/dL (ref 30.0–36.0)
MCV: 96.7 fL (ref 78.0–100.0)
MONOS PCT: 9 %
Monocytes Absolute: 0.6 10*3/uL (ref 0.1–1.0)
NEUTROS PCT: 75 %
Neutro Abs: 4.6 10*3/uL (ref 1.7–7.7)
Platelets: 114 10*3/uL — ABNORMAL LOW (ref 150–400)
RBC: 4.87 MIL/uL (ref 4.22–5.81)
RDW: 16.1 % — ABNORMAL HIGH (ref 11.5–15.5)
WBC: 6.2 10*3/uL (ref 4.0–10.5)

## 2018-06-17 LAB — BASIC METABOLIC PANEL
ANION GAP: 11 (ref 5–15)
BUN: 38 mg/dL — ABNORMAL HIGH (ref 8–23)
CHLORIDE: 103 mmol/L (ref 98–111)
CO2: 28 mmol/L (ref 22–32)
CREATININE: 1.63 mg/dL — AB (ref 0.61–1.24)
Calcium: 8.2 mg/dL — ABNORMAL LOW (ref 8.9–10.3)
GFR calc non Af Amer: 34 mL/min — ABNORMAL LOW (ref >60)
GFR, EST AFRICAN AMERICAN: 39 mL/min — AB (ref >60)
Glucose, Bld: 99 mg/dL (ref 70–99)
Potassium: 4 mmol/L (ref 3.5–5.1)
SODIUM: 142 mmol/L (ref 135–145)

## 2018-06-17 LAB — LACTIC ACID, PLASMA: Lactic Acid, Venous: 0.9 mmol/L (ref 0.5–1.9)

## 2018-06-17 MED ORDER — FUROSEMIDE 40 MG PO TABS
80.0000 mg | ORAL_TABLET | Freq: Every day | ORAL | Status: DC
  Administered 2018-06-18: 80 mg via ORAL
  Filled 2018-06-17: qty 2

## 2018-06-17 MED ORDER — POLYETHYLENE GLYCOL 3350 17 G PO PACK
17.0000 g | PACK | Freq: Two times a day (BID) | ORAL | Status: DC
  Administered 2018-06-17 – 2018-06-18 (×3): 17 g via ORAL
  Filled 2018-06-17 (×3): qty 1

## 2018-06-17 MED ORDER — FUROSEMIDE 40 MG PO TABS: 40.0000 mg | ORAL_TABLET | Freq: Every day | ORAL | Status: DC

## 2018-06-17 NOTE — Progress Notes (Signed)
[facility] PROGRESS NOTE    Progress Note  [name] [name]  ZHG:[id number] DOB: 07-12-1920 DOA: 06/15/2018 PCP: Cassandria Anger, MD     Brief Narrative:   [name] [name] is an 84 y.o. male past medical history of paroxysmal atrial fibrillation status post TEE with cardioversion, with chronic systolic heart failure with an EF of 20% chronic kidney disease stage III BPH patient with chills and call noted to be hypoxic in the ED found to be acquired pneumonia  Assessment/Plan:   Acute respiratory failure with hypoxia due to CAP (community acquired pneumonia): Focal consolidation on imaging of the left midlung. Patient had been fluid resuscitated placed on oxygen started empirically on IV Rocephin and azithromycin culture data has remained negative. Remain afebrile with no leukocytosis. Zickel therapy evaluated the patient the recommended home health PT.  Sepsis secondary to above: has been pancultured and have remained negative. He was fluid resuscitated and started on empiric antibiotics on admission.  His blood pressure seems to be stable. Lactic acidosis due to sepsis now resolved.  Chronic systolic heart failure: Currently on home dose of Lasix positive about 2.2 L Blood pressure seems to be tolerating it.  Essential hypertension: Is to be stable continue beta-blockers and diuretics.  Mild acute kidney injury on CKD (chronic kidney disease) stage 3, GFR 30-59 ml/min (HCC) Improved with IV fluid hydration.  Constipation: He was started on MiraLAX now improved.  Paroxysmal atrial fibrillation: Seems to be rate controlled continue amiodarone and Eliquis.  DVT prophylaxis: eluquis Family Communication:none Disposition Plan/Barrier to D/C: home in 1-2 days Code Status:     Code Status Orders  (From admission, onward)        Start     Ordered   06/15/18 0804  Do not attempt resuscitation (DNR)  Continuous    Question Answer Comment  In the event of cardiac or  respiratory ARREST Do not call a "code blue"   In the event of cardiac or respiratory ARREST Do not perform Intubation, CPR, defibrillation or ACLS   In the event of cardiac or respiratory ARREST Use medication by any route, position, wound care, and other measures to relive pain and suffering. May use oxygen, suction and manual treatment of airway obstruction as needed for comfort.      06/15/18 0804    Code Status History    Date Active Date Inactive Code Status Order ID Comments User Context   03/30/2018 2225 04/04/2018 1913 Full Code [id number]  Bennie Pierini, MD Inpatient   04/06/2017 1842 04/14/2017 1819 Full Code [id number]  Jonetta Osgood, MD Inpatient   08/22/2016 1801 08/23/2016 1802 Full Code [id number]  Louellen Molder, MD Inpatient   08/22/2016 1651 08/22/2016 1801 Full Code [id number]  Louellen Molder, MD ED   06/03/2016 2019 06/05/2016 1823 Full Code [id number]  Schorr, Rhetta Mura, NP Inpatient   05/28/2016 1514 06/03/2016 2019 DNR [id number]  Samella Parr, NP Inpatient    Advance Directive Documentation     Most Recent Value  Type of Advance Directive  Healthcare Power of Attorney, Living will  Pre-existing out of facility DNR order (yellow form or pink MOST form)  -  "MOST" Form in Place?  -        IV Access:    Peripheral IV   Procedures and diagnostic studies:   No results found.   Medical Consultants:    None.  Anti-Infectives:   IV Rocephin and azithromycin.  Subjective:    [name] [name] he relates his breathing  is better than yesterday.  Objective:    Vitals:   06/16/18 0903 06/16/18 1400 06/16/18 2013 06/17/18 0405  BP: (!) 110/94 116/70 118/70 107/79  Pulse: 80 (!) 54 66 79  Resp:  18 18   Temp:  97.7 F (36.5 C) 97.6 F (36.4 C) (!) 97.5 F (36.4 C)  TempSrc:  Oral Oral Oral  SpO2:  96% 96% 99%  Weight:      Height:        Intake/Output Summary (Last 24 hours) at 06/17/2018 0711 Last data filed at 06/17/2018 0557 Gross per 24  hour  Intake 1016.18 ml  Output 2300 ml  Net -1283.82 ml   Filed Weights   06/15/18 0929  Weight: 81.3 kg (179 lb 3.7 oz)    Exam: General exam: In no acute distress. Respiratory system: Good air movement and clear to auscultation. Cardiovascular system: S1 & S2 heard, RRR. No JVD, murmurs, rubs, gallops or clicks.  Gastrointestinal system: Abdomen is nondistended, soft and nontender.  Central nervous system: Alert and oriented. No focal neurological deficits. Extremities: No pedal edema. Skin: No rashes, lesions or ulcers Psychiatry: Judgement and insight appear normal. Mood & affect appropriate.    Data Reviewed:    Labs: Basic Metabolic Panel: Recent Labs  Lab 06/15/18 0151 06/15/18 1014 06/16/18 0453 06/17/18 0501  NA 141 143 142 142  K 4.6 3.7 3.8 4.0  CL 102 104 105 103  CO2 27 30 29 28   GLUCOSE 181* 130* 106* 99  BUN 33* 31* 33* 38*  CREATININE 2.12* 1.86* 1.74* 1.63*  CALCIUM 8.1* 7.7* 8.0* 8.2*   GFR Estimated Creatinine Clearance: 27.1 mL/min (A) (by C-G formula based on SCr of 1.63 mg/dL (H)). Liver Function Tests: No results for input(s): AST, ALT, ALKPHOS, BILITOT, PROT, ALBUMIN in the last 168 hours. No results for input(s): LIPASE, AMYLASE in the last 168 hours. No results for input(s): AMMONIA in the last 168 hours. Coagulation profile No results for input(s): INR, PROTIME in the last 168 hours.  CBC: Recent Labs  Lab 06/15/18 0151 06/15/18 1014 06/16/18 0453 06/17/18 0501  WBC 13.2* 15.0* 8.0 6.2  NEUTROABS 11.8*  --   --  4.6  HGB 17.5* 16.2 15.1 15.8  HCT 51.6 48.7 46.3 47.1  MCV 95.9 96.8 96.1 96.7  PLT 120* 112* 104* 114*   Cardiac Enzymes: No results for input(s): CKTOTAL, CKMB, CKMBINDEX, TROPONINI in the last 168 hours. BNP (last 3 results) No results for input(s): PROBNP in the last 8760 hours. CBG: No results for input(s): GLUCAP in the last 168 hours. D-Dimer: No results for input(s): DDIMER in the last 72 hours. Hgb  A1c: No results for input(s): HGBA1C in the last 72 hours. Lipid Profile: No results for input(s): CHOL, HDL, LDLCALC, TRIG, CHOLHDL, LDLDIRECT in the last 72 hours. Thyroid function studies: No results for input(s): TSH, T4TOTAL, T3FREE, THYROIDAB in the last 72 hours.  Invalid input(s): FREET3 Anemia work up: No results for input(s): VITAMINB12, FOLATE, FERRITIN, TIBC, IRON, RETICCTPCT in the last 72 hours. Sepsis Labs: Recent Labs  Lab 06/15/18 0151 06/15/18 0238 06/15/18 0401 06/15/18 1014 06/16/18 0453 06/17/18 0501  WBC 13.2*  --   --  15.0* 8.0 6.2  LATICACIDVEN  --  4.27* 2.71* 1.6  --  0.9   Microbiology Recent Results (from the past 240 hour(s))  Blood culture (routine x 2)     Status: None (Preliminary result)   Collection Time: 06/15/18  1:51 AM  Result Value Ref Range  Status   Specimen Description   Final    BLOOD BLOOD RIGHT ARM Performed at [facility] [address], [place], [place] [address]    Special Requests   Final    BOTTLES DRAWN AEROBIC AND ANAEROBIC Blood Culture adequate volume Performed at [facility] [address] [place], [place] [address]    Culture   Final    NO GROWTH 1 DAY Performed at [facility], [place] [address], [place], [place] [address]    Report Status PENDING  Incomplete  Blood culture (routine x 2)     Status: None (Preliminary result)   Collection Time: 06/15/18  1:56 AM  Result Value Ref Range Status   Specimen Description   Final    BLOOD BLOOD RIGHT ARM Performed at [facility] [address], [place], [place] [address]    Special Requests   Final    BOTTLES DRAWN AEROBIC AND ANAEROBIC Blood Culture adequate volume Performed at [facility] [address] [place], [place] [address]    Culture   Final    NO GROWTH 1 DAY Performed at [facility], [place] [address], [place], [place] [address]    Report Status PENDING   Incomplete     Medications:   . ALPRAZolam  1 mg Oral QHS  . amiodarone  200 mg Oral Daily  . apixaban  2.5 mg Oral BID  . famotidine  20 mg Oral BID  . finasteride  5 mg Oral Daily  . furosemide  80 mg Oral Daily  . guaiFENesin  1,200 mg Oral BID  . linaclotide  290 mcg Oral QAC breakfast  . polyethylene glycol  17 g Oral Daily  . pravastatin  20 mg Oral Daily  . senna  1-2 tablet Oral QHS  . sodium chloride flush  3 mL Intravenous Q12H   Continuous Infusions: . sodium chloride    . sodium chloride Stopped (06/16/18 1900)  . azithromycin Stopped (06/16/18 1122)  . cefTRIAXone (ROCEPHIN)  IV Stopped (06/16/18 1052)      LOS: 2 days   [facility] Pager [phone]  *Please refer to [email or web address], password [email or web address] to get updated schedule on who will round on this patient, as hospitalists switch teams weekly. If 7PM-7AM, please contact night-coverage at [email or web address], password [email or web address] for any overnight needs.  06/17/2018, 7:11 AM

## 2018-06-17 NOTE — Progress Notes (Addendum)
Physical Therapy Treatment Patient Details Name: [name] [name] MRN: [id number] DOB: Jan 19, 1920 Today's Date: 06/17/2018    History of Present Illness 84 y.o. male with medical history of PAF s/p TEE with cardioversion, cardiomyopathy with EF of 20%, HTN, CKD3, BPH, right eye blindness and admitted for CAP and sepsis    PT Comments    Th patient is improving in balance and  Ambulation. Highly recommend that patient have 24/7 caregivers initially.  Patient is noted to cough frequently and clear throat often. Continue PT. Patient did  gwet up x 1  Without  Close supervision.   Follow Up Recommendations  Home health PT;Supervision/Assistance - 24 hour     Equipment Recommendations  None recommended by PT    Recommendations for Other Services       Precautions / Restrictions Precautions Precautions: Fall    Mobility  Bed Mobility   Bed Mobility: Sit to Supine       Sit to supine: Min guard   General bed mobility comments: extra time to get legs onto bed  Transfers   Equipment used: Rolling walker (2 wheeled) Transfers: Sit to/from Stand Sit to Stand: Min guard         General transfer comment: stood with and without RW in front  Ambulation/Gait Ambulation/Gait assistance: Herbalist (Feet): 300 Feet Assistive device: Rolling walker (2 wheeled) Gait Pattern/deviations: Step-through pattern;Decreased stride length     General Gait Details: 1 rest break standing.   Stairs             Wheelchair Mobility    Modified Rankin (Stroke Patients Only)       Balance Overall balance assessment: Mild deficits observed, not formally tested                                          Cognition Arousal/Alertness: Awake/alert Behavior During Therapy: WFL for tasks assessed/performed Overall Cognitive Status: Within Functional Limits for tasks assessed                                        Exercises General  Exercises - Lower Extremity Long Arc Quad: AROM;Both;10 reps;Seated Hip Flexion/Marching: AROM;Seated;Both;10 reps    General Comments        Pertinent Vitals/Pain Pain Assessment: No/denies pain    Home Living                      Prior Function            PT Goals (current goals can now be found in the care plan section) Progress towards PT goals: Progressing toward goals    Frequency    Min 3X/week      PT Plan Current plan remains appropriate    Co-evaluation              AM-PAC PT "6 Clicks" Daily Activity  Outcome Measure  Difficulty turning over in bed (including adjusting bedclothes, sheets and blankets)?: A Little Difficulty moving from lying on back to sitting on the side of the bed? : A Little Difficulty sitting down on and standing up from a chair with arms (e.g., wheelchair, bedside commode, etc,.)?: A Lot Help needed moving to and from a bed to chair (including a wheelchair)?: A Little Help needed walking in  hospital room?: A Little Help needed climbing 3-5 steps with a railing? : A Lot 6 Click Score: 16    End of Session Equipment Utilized During Treatment: Gait belt Activity Tolerance: Patient tolerated treatment well Patient left: in bed;with call bell/phone within reach;with bed alarm set Nurse Communication: Mobility status PT Visit Diagnosis: Other abnormalities of gait and mobility (R26.89)     Time: 6962-9528 PT Time Calculation (min) (ACUTE ONLY): 32 min  Charges:  $Gait Training: 23-37 mins                     [facility] [phone]    Claretha Cooper 06/17/2018, 2:55 PM

## 2018-06-18 DIAGNOSIS — F329 Major depressive disorder, single episode, unspecified: Secondary | ICD-10-CM

## 2018-06-18 DIAGNOSIS — Z7901 Long term (current) use of anticoagulants: Secondary | ICD-10-CM

## 2018-06-18 DIAGNOSIS — K219 Gastro-esophageal reflux disease without esophagitis: Secondary | ICD-10-CM

## 2018-06-18 DIAGNOSIS — N183 Chronic kidney disease, stage 3 (moderate): Secondary | ICD-10-CM | POA: Diagnosis not present

## 2018-06-18 DIAGNOSIS — E785 Hyperlipidemia, unspecified: Secondary | ICD-10-CM

## 2018-06-18 DIAGNOSIS — M19011 Primary osteoarthritis, right shoulder: Secondary | ICD-10-CM

## 2018-06-18 DIAGNOSIS — J159 Unspecified bacterial pneumonia: Secondary | ICD-10-CM

## 2018-06-18 DIAGNOSIS — F419 Anxiety disorder, unspecified: Secondary | ICD-10-CM

## 2018-06-18 DIAGNOSIS — M19012 Primary osteoarthritis, left shoulder: Secondary | ICD-10-CM

## 2018-06-18 DIAGNOSIS — A419 Sepsis, unspecified organism: Principal | ICD-10-CM

## 2018-06-18 DIAGNOSIS — I130 Hypertensive heart and chronic kidney disease with heart failure and stage 1 through stage 4 chronic kidney disease, or unspecified chronic kidney disease: Secondary | ICD-10-CM | POA: Diagnosis not present

## 2018-06-18 DIAGNOSIS — I5023 Acute on chronic systolic (congestive) heart failure: Secondary | ICD-10-CM | POA: Diagnosis not present

## 2018-06-18 DIAGNOSIS — N400 Enlarged prostate without lower urinary tract symptoms: Secondary | ICD-10-CM

## 2018-06-18 DIAGNOSIS — I482 Chronic atrial fibrillation: Secondary | ICD-10-CM | POA: Diagnosis not present

## 2018-06-18 DIAGNOSIS — J9601 Acute respiratory failure with hypoxia: Secondary | ICD-10-CM

## 2018-06-18 DIAGNOSIS — I429 Cardiomyopathy, unspecified: Secondary | ICD-10-CM

## 2018-06-18 MED ORDER — AZITHROMYCIN 250 MG PO TABS
500.0000 mg | ORAL_TABLET | Freq: Every day | ORAL | Status: DC
  Administered 2018-06-18: 500 mg via ORAL
  Filled 2018-06-18: qty 2

## 2018-06-18 MED ORDER — FLEET ENEMA 7-19 GM/118ML RE ENEM
1.0000 | ENEMA | Freq: Once | RECTAL | Status: AC
  Administered 2018-06-18: 1 via RECTAL
  Filled 2018-06-18: qty 1

## 2018-06-18 MED ORDER — AZITHROMYCIN 250 MG PO TABS: ORAL_TABLET | ORAL | 0 refills | Status: DC

## 2018-06-18 MED ORDER — AMOXICILLIN-POT CLAVULANATE 500-125 MG PO TABS
500.0000 mg | ORAL_TABLET | Freq: Two times a day (BID) | ORAL | Status: DC
  Filled 2018-06-18: qty 1

## 2018-06-18 MED ORDER — AMOXICILLIN-POT CLAVULANATE 500-125 MG PO TABS: 500.0000 mg | ORAL_TABLET | Freq: Two times a day (BID) | ORAL | 0 refills | Status: AC

## 2018-06-18 NOTE — Discharge Summary (Signed)
Physician Discharge Summary  Matheus Spiker EXB:[id number] DOB: [birth year] DOA: 06/15/2018  PCP: Cassandria Anger, MD  Admit date: 06/15/2018 Discharge date: 06/18/2018  Admitted From: home Disposition:  Home  Recommendations for Outpatient Follow-up:  1. Follow up with PCP in 1-2 weeks   Home Health:Yes Equipment/Devices:none  Discharge Condition:stable CODE STATUS:DNR Diet recommendation: Heart Healthy   Brief/Interim Summary: 84 y.o. male past medical history of paroxysmal atrial fibrillation status post TEE with cardioversion, with chronic systolic heart failure with an EF of 20% chronic kidney disease stage III BPH patient with chills and call noted to be hypoxic in the ED found to be acquired pneumonia    Discharge Diagnoses:  Principal Problem:   Bacterial pneumonia Active Problems:   Essential hypertension   CKD (chronic kidney disease) stage 3, GFR 30-59 ml/min (HCC)   Congestive dilated cardiomyopathy (HCC)   PAF (paroxysmal atrial fibrillation) (HCC)   Acute respiratory failure with hypoxia (HCC)   Sepsis ([place]) Acute respiratory failure with hypoxia due to bacterial pneumonia: Chest x-ray show focal consolidation on the left midlung. He was started on IV fluid, IV Rocephin and azithromycin culture data remain negative urine antigens remain negative. He defervesced leukocytosis improved. He was changed to Augmentin oral azithromycin which she will continue at home as an outpatient. Physical therapy evaluated the patient the recommended skilled nursing facility.  Sepsis secondary to bacterial pneumonia: He was pancultured remain negative he will continue his antibiotics as above.  Chronic systolic heart failure: No changes were made to his medication.  Essential hypertension: No changes were made to his medication.  Mild acute kidney injury on chronic kidney disease stage III: His diuretics were held in admission, there is likely prerenal in etiology we will  start on IV fluids and it resolved. He is Lasix was started before discharge and he remained stable.  Constipation: He was started on MiraLAX which she will continue at home and was given a Fleet enemas.  Paroxysmal atrial fibrillation: He was continue on amiodarone and Eliquis.   Discharge Instructions  Discharge Instructions    Diet - low sodium heart healthy   Complete by:  As directed    Increase activity slowly   Complete by:  As directed      Allergies as of 06/18/2018      Reactions   Lisinopril Cough      Medication List    TAKE these medications   ALPRAZolam 1 MG tablet Commonly known as:  XANAX Take 1 tablet (1 mg total) by mouth at bedtime.   amiodarone 200 MG tablet Commonly known as:  PACERONE TAKE 1 TABLET ONCE DAILY.   amoxicillin-clavulanate 500-125 MG tablet Commonly known as:  AUGMENTIN Take 1 tablet (500 mg total) by mouth every 12 (twelve) hours for 6 days.   apixaban 2.5 MG Tabs tablet Commonly known as:  ELIQUIS Take 1 tablet (2.5 mg total) by mouth 2 (two) times daily.   azithromycin 250 MG tablet Commonly known as:  ZITHROMAX Take on tab daily Start taking on:  06/19/2018   benzonatate 100 MG capsule Commonly known as:  TESSALON Take 1 capsule (100 mg total) by mouth 3 (three) times daily as needed. What changed:  reasons to take this   finasteride 5 MG tablet Commonly known as:  PROSCAR Take 1 tablet (5 mg total) by mouth daily.   furosemide 40 MG tablet Commonly known as:  LASIX Take 2 tablets (80 mg total) by mouth every morning.   linaclotide 290 MCG Caps  capsule Commonly known as:  LINZESS Take 1 capsule (290 mcg total) by mouth daily before breakfast. What changed:    how much to take  when to take this  reasons to take this   polyethylene glycol packet Commonly known as:  MIRALAX / GLYCOLAX Take 17 g by mouth at bedtime.   polyethylene glycol-electrolytes 420 g solution Commonly known as:  NuLYTELY/GoLYTELY Use  200 ml 4 times a day prn constipation in addition to Miralax (17 g bid) and Linzess   pravastatin 20 MG tablet Commonly known as:  PRAVACHOL TAKE 1 TABLET EACH DAY.   ranitidine 150 MG tablet Commonly known as:  ZANTAC Take 1 tablet (150 mg total) by mouth 2 (two) times daily as needed for heartburn.   senna 8.6 MG Tabs tablet Commonly known as:  SENOKOT Take 1-2 tablets (8.6-17.2 mg total) by mouth at bedtime.   traMADol 50 MG tablet Commonly known as:  ULTRAM Take 0.5-1 tablets (25-50 mg total) by mouth every 6 (six) hours as needed for severe pain.   VENTOLIN HFA 108 (90 Base) MCG/ACT inhaler Generic drug:  albuterol Inhale 2 puffs into the lungs every 6 (six) hours as needed for wheezing or shortness of breath.       Allergies  Allergen Reactions  . Lisinopril Cough    Consultations:  None   Procedures/Studies: Ct Abdomen Pelvis Wo Contrast  Result Date: 06/15/2018 CLINICAL DATA:  84 year old male with constipation for 3 days. No relief with over the counter stool softeners. Post bilateral inguinal hernia repair, cholecystectomy and appendectomy. Subsequent encounter. EXAM: CT ABDOMEN AND PELVIS WITHOUT CONTRAST TECHNIQUE: Multidetector CT imaging of the abdomen and pelvis was performed following the standard protocol without IV contrast. COMPARISON:  08/16/2018 plain film exam.  04/06/2017 CT. FINDINGS: Lower chest: Bilateral lower lobe and inferior lingula consolidation may represent infiltrates and/or atelectasis. Associated pleural effusion greater on the right. Small amount of gas within the non dependent aspect of the right atrium and right ventricle. Question if this is related to IV line. Heart slightly enlarged. Hepatobiliary: Taking into account limitation by non contrast imaging, no worrisome hepatic lesion. Post cholecystectomy. Pancreas: Taking into account limitation by non contrast imaging, no worrisome pancreatic lesion or inflammation. Spleen: Taking into  account limitation by non contrast imaging, no splenic mass or enlargement. Adrenals/Urinary Tract: Chronic left hydronephrosis with ureteral pelvic junction obstruction pattern unchanged. No right-sided hydronephrosis. Bilateral renal cysts some of which are minimally complex. Taking into account limitation by non contrast imaging, no worrisome renal or adrenal lesion. Noncontrast filled imaging of the urinary bladder unremarkable. Stomach/Bowel: Cecum and appendix extend into right inguinal canal. The terminal ileum is just above the herniated bowel. Fluid and gas-filled top-normal size small bowel loops without evidence of obstruction. If there were further herniation of colon into the right inguinal canal, it is possible this could cause small-bowel obstruction. Stool and gas throughout the colon without obstruction noted. Stomach unremarkable. Vascular/Lymphatic: Atherosclerotic changes aorta and aortic branch vessels. No abdominal aortic aneurysm. Scattered normal size lymph nodes. Reproductive: No worrisome abnormality. Other: No free intraperitoneal air.  No drainable fluid collection. Musculoskeletal: Degenerative changes throughout the lower thoracic and lumbar spine most notable L4-5 level. Hip joint degenerative changes. Sacroiliac joint degenerative changes. IMPRESSION: 1. Small amount of gas within the non dependent aspect of the right atrium and right ventricle. Question if this is related to IV line. 2. Cecum and appendix extend into right inguinal canal. The terminal ileum is just above the  herniated bowel. Fluid and gas-filled top-normal size small bowel loops without evidence of obstruction. If there were further herniation of colon into the right inguinal canal, it is possible this could cause small-bowel obstruction. 3. Stool and gas throughout the colon without obstruction noted. 4. Bilateral lower lobe and inferior lingula consolidation may represent infiltrates and/or atelectasis. Associated  pleural effusion greater on the right. 5. Chronic left hydronephrosis with ureteral pelvic junction obstruction pattern unchanged. Bilateral renal cysts similar to prior exam. 6.  Aortic Atherosclerosis (ICD10-I70.0). These results will be called to the ordering clinician or representative by the Radiologist Assistant, and communication documented in the PACS or zVision Dashboard. Electronically Signed   By: Genia Del M.D.   On: 06/15/2018 07:24   Dg Abd Acute W/chest  Result Date: 06/15/2018 CLINICAL DATA:  Constipation for 3 days. EXAM: DG ABDOMEN ACUTE W/ 1V CHEST COMPARISON:  Chest radiograph Mar 30, 2018 FINDINGS: Stable cardiomegaly. Calcified aortic knob. Small to moderate pleural layering pleural effusions, decreased on the RIGHT. Patchy LEFT mid lung zone airspace opacity. No pneumothorax. Osteopenia. Severe degenerative change of the shoulders. Old RIGHT rib fractures. Gas-filled mildly distended small large bowel, small bowel measures to 3.4 cm, large bowel measures to 7.9 cm. Moderate amount of retained large bowel stool. Surgical clips in the included right abdomen compatible with cholecystectomy. No intra-abdominal mass effect, pathologic calcifications or free air. Soft tissue planes and included osseous structures are non-suspicious. Osteopenia. IMPRESSION: Stable cardiomegaly with small to moderate pleural effusions. Focal consolidation LEFT mid lung zone. Mild ileus and moderate amount of retained large bowel stool. Electronically Signed   By: Elon Alas M.D.   On: 06/15/2018 04:09    Subjective: No complains  Discharge Exam: Vitals:   06/17/18 2003 06/18/18 0537  BP: (!) 110/93 127/80  Pulse: (!) 107 92  Resp: 20 18  Temp: 97.8 F (36.6 C) (!) 97.5 F (36.4 C)  SpO2: 96% 97%   Vitals:   06/17/18 0405 06/17/18 1322 06/17/18 2003 06/18/18 0537  BP: 107/79 (!) 109/92 (!) 110/93 127/80  Pulse: 79 97 (!) 107 92  Resp:  18 20 18   Temp: (!) 97.5 F (36.4 C) 97.6 F  (36.4 C) 97.8 F (36.6 C) (!) 97.5 F (36.4 C)  TempSrc: Oral Oral Oral Oral  SpO2: 99% 97% 96% 97%  Weight:      Height:        General: Pt is alert, awake, not in acute distress Cardiovascular: RRR, S1/S2 +, no rubs, no gallops Respiratory: CTA bilaterally, no wheezing, no rhonchi Abdominal: Soft, NT, ND, bowel sounds + Extremities: no edema, no cyanosis    The results of significant diagnostics from this hospitalization (including imaging, microbiology, ancillary and laboratory) are listed below for reference.     Microbiology: Recent Results (from the past 240 hour(s))  Blood culture (routine x 2)     Status: None (Preliminary result)   Collection Time: 06/15/18  1:51 AM  Result Value Ref Range Status   Specimen Description   Final    BLOOD BLOOD RIGHT ARM Performed at [facility] [address], [place], [place] [address]    Special Requests   Final    BOTTLES DRAWN AEROBIC AND ANAEROBIC Blood Culture adequate volume Performed at [facility] [address] [place], [place] [address]    Culture   Final    NO GROWTH 2 DAYS Performed at [facility] [address], [place], [place] [address]  Report Status PENDING  Incomplete  Blood culture (routine x 2)     Status: None (Preliminary result)   Collection Time: 06/15/18  1:56 AM  Result Value Ref Range Status   Specimen Description   Final    BLOOD BLOOD RIGHT ARM Performed at [facility] [address] [place], [place] [address]    Special Requests   Final    BOTTLES DRAWN AEROBIC AND ANAEROBIC Blood Culture adequate volume Performed at [facility] [address] [place], [place] [address]    Culture   Final    NO GROWTH 2 DAYS Performed at [facility] [address], [place], [place] [address]    Report Status PENDING  Incomplete     Labs: BNP (last 3 results) Recent Labs    03/30/18 1750  BNP 758.0*    Basic Metabolic Panel: Recent Labs  Lab 06/15/18 0151 06/15/18 1014 06/16/18 0453 06/17/18 0501  NA 141 143 142 142  K 4.6 3.7 3.8 4.0  CL 102 104 105 103  CO2 27 30 29 28   GLUCOSE 181* 130* 106* 99  BUN 33* 31* 33* 38*  CREATININE 2.12* 1.86* 1.74* 1.63*  CALCIUM 8.1* 7.7* 8.0* 8.2*   Liver Function Tests: No results for input(s): AST, ALT, ALKPHOS, BILITOT, PROT, ALBUMIN in the last 168 hours. No results for input(s): LIPASE, AMYLASE in the last 168 hours. No results for input(s): AMMONIA in the last 168 hours. CBC: Recent Labs  Lab 06/15/18 0151 06/15/18 1014 06/16/18 0453 06/17/18 0501  WBC 13.2* 15.0* 8.0 6.2  NEUTROABS 11.8*  --   --  4.6  HGB 17.5* 16.2 15.1 15.8  HCT 51.6 48.7 46.3 47.1  MCV 95.9 96.8 96.1 96.7  PLT 120* 112* 104* 114*   Cardiac Enzymes: No results for input(s): CKTOTAL, CKMB, CKMBINDEX, TROPONINI in the last 168 hours. BNP: Invalid input(s): POCBNP CBG: No results for input(s): GLUCAP in the last 168 hours. D-Dimer No results for input(s): DDIMER in the last 72 hours. Hgb A1c No results for input(s): HGBA1C in the last 72 hours. Lipid Profile No results for input(s): CHOL, HDL, LDLCALC, TRIG, CHOLHDL, LDLDIRECT in the last 72 hours. Thyroid function studies No results for input(s): TSH, T4TOTAL, T3FREE, THYROIDAB in the last 72 hours.  Invalid input(s): FREET3 Anemia work up No results for input(s): VITAMINB12, FOLATE, FERRITIN, TIBC, IRON, RETICCTPCT in the last 72 hours. Urinalysis    Component Value Date/Time   COLORURINE YELLOW 06/15/2018 [place] 06/15/2018 0459   LABSPEC 1.012 06/15/2018 0459   PHURINE 6.0 06/15/2018 0459   GLUCOSEU NEGATIVE 06/15/2018 0459   GLUCOSEU NEGATIVE 10/23/2017 1245   HGBUR NEGATIVE 06/15/2018 0459   BILIRUBINUR NEGATIVE 06/15/2018 0459   KETONESUR NEGATIVE 06/15/2018 0459   PROTEINUR NEGATIVE 06/15/2018 0459   UROBILINOGEN 0.2 10/23/2017 1245   NITRITE NEGATIVE 06/15/2018  0459   LEUKOCYTESUR NEGATIVE 06/15/2018 0459   Sepsis Labs Invalid input(s): PROCALCITONIN,  WBC,  LACTICIDVEN Microbiology Recent Results (from the past 240 hour(s))  Blood culture (routine x 2)     Status: None (Preliminary result)   Collection Time: 06/15/18  1:51 AM  Result Value Ref Range Status   Specimen Description   Final    BLOOD BLOOD RIGHT ARM Performed at [facility], [place] [address], [place],  [address]    Special Requests   Final    BOTTLES DRAWN AEROBIC AND ANAEROBIC Blood Culture adequate volume Performed at [facility] [address] [place], [place] [address]    Culture   Final    NO GROWTH 2 DAYS Performed at [facility] [address] [place], [place] [address]    Report Status PENDING  Incomplete  Blood culture (routine x 2)     Status: None (Preliminary result)   Collection Time: 06/15/18  1:56 AM  Result Value Ref Range Status   Specimen Description   Final    BLOOD BLOOD RIGHT ARM Performed at [facility] [address] [place], [place] [address]    Special Requests   Final    BOTTLES DRAWN AEROBIC AND ANAEROBIC Blood Culture adequate volume Performed at [facility] [address], [place], [place] [address]    Culture   Final    NO GROWTH 2 DAYS Performed at [facility] [address], [place],  [address]    Report Status PENDING  Incomplete     Time coordinating discharge: 35 minutes  SIGNED:   Charlynne Cousins, MD  [facility] 06/18/2018, 10:09 AM Pager   If 7PM-7AM, please contact night-coverage [email or web address] Password [email or web address]

## 2018-06-18 NOTE — Progress Notes (Signed)
D/c ing to home w/ son and HH. All d/c instructions given and script given. Awaiting ride.

## 2018-06-18 NOTE — Care Management Important Message (Signed)
Important Message  Patient Details  Name: Jjesus Dingley MRN: [id number] Date of Birth: 1920/07/09   Medicare Important Message Given:  Yes    Kerin Salen 06/18/2018, 11:29 AMImportant Message  Patient Details  Name: Coleby Yett MRN: [id number] Date of Birth: 1920/06/04   Medicare Important Message Given:  Yes    Kerin Salen 06/18/2018, 11:29 AM

## 2018-06-18 NOTE — Care Management Note (Signed)
Case Management Note  Patient Details  Name: Keagen Heinlen MRN: [id number] Date of Birth: March 16, 1920  Subjective/Objective:dc home w/HHC-[facility] rep Santiago Glad aware. No further CM needs.                    Action/Plan:d/c home w/HHC.   Expected Discharge Date:  06/18/18               Expected Discharge Plan:  [place]  In-House Referral:     Discharge planning Services  CM Consult  Post Acute Care Choice:  Home Health(Active w/[facility]-RN/aide) Choice offered to:  Patient  DME Arranged:    DME Agency:     HH Arranged:  RN, PT, Nurse's Aide [place] Agency:  [facility]  Status of Service:  Completed, signed off  If discussed at [place] of Stay Meetings, dates discussed:    Additional Comments:  Dessa Phi, RN 06/18/2018, 10:17 AM

## 2018-06-20 LAB — CULTURE, BLOOD (ROUTINE X 2)
Culture: NO GROWTH
Culture: NO GROWTH
Special Requests: ADEQUATE
Special Requests: ADEQUATE

## 2018-06-22 ENCOUNTER — Telehealth: Payer: Self-pay | Admitting: Internal Medicine

## 2018-06-22 NOTE — Telephone Encounter (Signed)
Copied from [place] [id number]. Topic: Quick Communication - See Telephone Encounter >> Jun 22, 2018  1:29 PM Robina Ade, Helene Kelp D wrote: CRM for notification. See Telephone encounter for: 06/22/18. Herbert Deaner with home health called to let Dr. Alain Marion know that a PT eval was preform after hospital visit. A verbal order is requested for functional mobility PT for 1x a week for 4 weeks, recommends medication social worker for [employer] for pt, he require an aid and speech therapy eval for swallowing, and pt has history of pneumonia and intermediate to swallow. He can be reached at [phone] if any questions.

## 2018-06-23 ENCOUNTER — Telehealth: Payer: Self-pay | Admitting: Cardiology

## 2018-06-23 NOTE — Telephone Encounter (Signed)
New Message        Patient had a Heart Calf. Yesterday and patient is calling to get medication or an appt. Which do patient need?

## 2018-06-23 NOTE — Telephone Encounter (Signed)
New Message        Billie with [facility] is asking if patient is suppose  to take "Diltiazem ER 120 mg daily" Pls advise

## 2018-06-23 NOTE — Telephone Encounter (Signed)
Verbals given, FYI 

## 2018-06-24 ENCOUNTER — Telehealth: Payer: Self-pay | Admitting: Internal Medicine

## 2018-06-24 ENCOUNTER — Telehealth: Payer: Self-pay

## 2018-06-24 NOTE — Telephone Encounter (Signed)
Please advise  Copied from [place] [id number]. Topic: Quick Communication - See Telephone Encounter >> Jun 23, 2018  3:27 PM Hewitt Shorts wrote: Lexine Baton from home health is wanting to know if something can be called in to help with diarrhea which she believe is being caused by the arythomycin and amoxicilin  Best number for [facility] is [phone]

## 2018-06-24 NOTE — Telephone Encounter (Signed)
Spoke with Nurse, advised of last office visit with [name] Ransom PA that he stopped Diltiazem.   [name] Ransom, PA-C has recommended making the following medication changes: 1. STOP Diltiazem 2. CHANGE the way you take Furosemide             >>TAKE 80 mg ONCE DAILY  Your physician recommends that you schedule a follow-up appointment in AUGUST 2019 with Dr Percival Spanish.   Nurse understood, verbalized understanding.

## 2018-06-24 NOTE — Telephone Encounter (Signed)
Copied from [place] [id number]. Topic: Inquiry >> Jun 24, 2018  1:47 PM Pricilla Handler wrote: Reason for CRM: Ebony Hail with [facility] [phone]) called requesting verbal orders for Speech Therapy for the patient. Requested is Speech Therapy Once a Week for 2 Weeks for swallowing. Please call Ebony Hail with [facility] at [phone].       Thank You!!!

## 2018-06-24 NOTE — Telephone Encounter (Signed)
Previous message was taken in error.. Disregard

## 2018-06-24 NOTE — Telephone Encounter (Signed)
thx

## 2018-06-25 NOTE — Telephone Encounter (Signed)
Take OTC Imodium AD 1-2 tabs qid prn Thx

## 2018-06-25 NOTE — Telephone Encounter (Signed)
Nikki notified.

## 2018-06-25 NOTE — Telephone Encounter (Signed)
Verbals given, FYI 

## 2018-06-26 NOTE — Telephone Encounter (Signed)
Thx

## 2018-06-30 NOTE — Telephone Encounter (Signed)
I'm not sure I understand what the problem is thx

## 2018-06-30 NOTE — Telephone Encounter (Signed)
Crystal, RN with [facility] called and said that he is still having issues with diarreha. He has been taking immodium and anti diarreha. When he starts to take one or the other, he has the opposite effect of both. He has not had a BM in 3 days. What else can be done for him? Call back @ [phone]

## 2018-06-30 NOTE — Telephone Encounter (Signed)
Please advise 

## 2018-07-02 ENCOUNTER — Telehealth: Payer: Self-pay | Admitting: Cardiology

## 2018-07-02 NOTE — Telephone Encounter (Signed)
HOLD the diuretic today, continue all other medication, ensure proper hydration, and continue to monitor BP.

## 2018-07-02 NOTE — Telephone Encounter (Signed)
Called home health nurse. She states that patient was a little fatigued this morning when she arrived, but it was because he had just finished dressing himself.   She check BP it was 92/60 sitting HR 78 at rest. Standing it was 82/58 unknown HR  Patient denies dizziness, CP, SOB.   Patient has had no medications this morning, nurse is just wondering if anything needs to be held today.   Please advise.  Thank you!

## 2018-07-02 NOTE — Telephone Encounter (Signed)
Called home health nurse, was advised to hold Diuretic. She verbalized understanding.

## 2018-07-02 NOTE — Telephone Encounter (Signed)
New Message      [place] with [facility] is calling to report patient bp sitting is 92/60 standing 82/58. Pls. advise if necessary

## 2018-07-06 ENCOUNTER — Ambulatory Visit: Payer: Self-pay | Admitting: *Deleted

## 2018-07-06 ENCOUNTER — Telehealth: Payer: Self-pay

## 2018-07-06 NOTE — Telephone Encounter (Signed)
Ebony Hail with Speech Therapy called to report that the pt had a manual BP of 85/63 and P-65. Per ST pt verbalized that he had the worst night due to urinating all night long. ST reports that pt had taken Furosemide and Amiodarone this morning. Pt denies any SOB, chest pain or dizziness at this time. ST reports that she was told by the nurse that the pt had a similar episode last week on 8/15 and was advised by the cardiologist to hold a dose of Furosemide. Pt offered to make appt for today at 2:20pm with PCP but pt states he already has an appt at that time with the nurse. Pt advised that he should come into the office for appt but if he became worse to call office back. Understanding verbalized Reason for Disposition . [7] Systolic BP < 90 AND [0] NOT dizzy, lightheaded or weak  Answer Assessment - Initial Assessment Questions 1. BLOOD PRESSURE: "What is the blood pressure?" "Did you take at least two measurements 5 minutes apart?"     85/63 2. ONSET: "When did you take your blood pressure?"     Prior to calling office 3. HOW: "How did you obtain the blood pressure?" (e.g., visiting nurse, automatic home BP monitor)     Manual with ST 4. HISTORY: "Do you have a history of low blood pressure?" "What is your blood pressure normally?"     Was low last week, some of the prior readings in mid July were 122/62 and 104/74 5. MEDICATIONS: "Are you taking any medications for blood pressure?" If yes: "Have they been changed recently?"     Yes- Furosemide and Amiodarone 6. PULSE RATE: "Do you know what your pulse rate is?"      65 7. OTHER SYMPTOMS: "Have you been sick recently?" "Have you had a recent injury?"     No  Protocols used: LOW BLOOD PRESSURE-A-AH

## 2018-07-06 NOTE — Telephone Encounter (Signed)
Received a call from [facility] NP with [facility].She stated she is at patient's home and he is not feeling good,weakness.Stated wife told her [facility] RN saw patient 07/02/18,patient's B/P low.Stated she was calling to verify Lasix dosage.She was told by wife he was suppose to stop Lasix.Stated patient's medications are bubble wrapped and he is still taking Lasix 80 mg daily.B/P at present 110/80.Stated she is concerned about holding Lasix.She will take out today's Lasix dose.Appointment scheduled with Dr.Hochrein 07/07/18 at 2:20 pm.Advised to bring all medications to appointment.
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
## 2018-07-07 ENCOUNTER — Ambulatory Visit: Payer: Medicare Other | Admitting: Cardiology

## 2018-07-07 ENCOUNTER — Encounter: Payer: Self-pay | Admitting: Cardiology

## 2018-07-07 VITALS — BP 112/69 | HR 60 | Ht 67.0 in | Wt 172.6 lb

## 2018-07-07 DIAGNOSIS — N183 Chronic kidney disease, stage 3 unspecified: Secondary | ICD-10-CM

## 2018-07-07 DIAGNOSIS — Z79899 Other long term (current) drug therapy: Secondary | ICD-10-CM | POA: Diagnosis not present

## 2018-07-07 DIAGNOSIS — I482 Chronic atrial fibrillation, unspecified: Secondary | ICD-10-CM

## 2018-07-07 DIAGNOSIS — I480 Paroxysmal atrial fibrillation: Secondary | ICD-10-CM | POA: Diagnosis not present

## 2018-07-07 DIAGNOSIS — M7989 Other specified soft tissue disorders: Secondary | ICD-10-CM

## 2018-07-07 DIAGNOSIS — I5022 Chronic systolic (congestive) heart failure: Secondary | ICD-10-CM | POA: Diagnosis not present

## 2018-07-07 MED ORDER — DILTIAZEM HCL 30 MG PO TABS: 30.0000 mg | ORAL_TABLET | Freq: Two times a day (BID) | ORAL | 6 refills | Status: DC

## 2018-07-07 NOTE — Patient Instructions (Signed)
Medication Instructions:  START- Cardizem 30 mg twice a day DECREASE- Lasix 40 mg daily  If you need a refill on your cardiac medications before your next appointment, please call your pharmacy.  Labwork: BMP HERE IN OUR OFFICE AT [facility]  Take the provided lab slips with you to the lab for your blood draw.  You will NOT need to fast   Testing/Procedures: None Ordered   Follow-Up: Your physician wants you to follow-up in: 10 days with Kerin Ransom.      Thank you for choosing [facility] at [facility]!!

## 2018-07-08 LAB — BASIC METABOLIC PANEL
BUN/Creatinine Ratio: 14 (ref 10–24)
BUN: 26 mg/dL (ref 10–36)
CHLORIDE: 102 mmol/L (ref 96–106)
CO2: 24 mmol/L (ref 20–29)
CREATININE: 1.88 mg/dL — AB (ref 0.76–1.27)
Calcium: 8.8 mg/dL (ref 8.6–10.2)
GFR calc Af Amer: 34 mL/min/{1.73_m2} — ABNORMAL LOW (ref >59)
GFR calc non Af Amer: 29 mL/min/{1.73_m2} — ABNORMAL LOW (ref >59)
GLUCOSE: 92 mg/dL (ref 65–99)
POTASSIUM: 4.5 mmol/L (ref 3.5–5.2)
SODIUM: 144 mmol/L (ref 134–144)

## 2018-07-08 NOTE — Telephone Encounter (Signed)
Noted. Thx.

## 2018-07-09 ENCOUNTER — Telehealth: Payer: Self-pay | Admitting: Cardiology

## 2018-07-09 NOTE — Telephone Encounter (Signed)
New Message:   Nikki from [facility]  Pt c/o BP issue: STAT if pt c/o blurred vision, one-sided weakness or slurred speech  1. What are your last 5 BP readings? 92/62 sitting 90/60 standing   HR: 107   HR 88  2. Are you having any other symptoms (ex. Dizziness, headache, blurred vision, passed out)? NO   3. What is your BP issue?  BP is too low

## 2018-07-09 NOTE — Telephone Encounter (Signed)
Spoke with pt, he reports he feels the best he has felt in years. He denies any dizziness or problems. Orthostatic symptoms discussed with the patient. Patient voiced understanding. Spoke with [facility] and made her aware we will continue to follow and no changes at this time.
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
## 2018-07-13 ENCOUNTER — Encounter: Payer: Self-pay | Admitting: Cardiology

## 2018-07-13 ENCOUNTER — Ambulatory Visit: Payer: Medicare Other | Admitting: Cardiology

## 2018-07-13 VITALS — BP 84/62 | HR 101 | Ht 67.0 in | Wt 173.0 lb

## 2018-07-13 DIAGNOSIS — I5043 Acute on chronic combined systolic (congestive) and diastolic (congestive) heart failure: Secondary | ICD-10-CM | POA: Diagnosis not present

## 2018-07-13 DIAGNOSIS — I480 Paroxysmal atrial fibrillation: Secondary | ICD-10-CM | POA: Diagnosis not present

## 2018-07-13 LAB — BASIC METABOLIC PANEL
BUN/Creatinine Ratio: 16 (ref 10–24)
BUN: 28 mg/dL (ref 10–36)
CALCIUM: 9.1 mg/dL (ref 8.6–10.2)
CO2: 23 mmol/L (ref 20–29)
Chloride: 105 mmol/L (ref 96–106)
Creatinine, Ser: 1.77 mg/dL — ABNORMAL HIGH (ref 0.76–1.27)
GFR, EST AFRICAN AMERICAN: 37 mL/min/{1.73_m2} — AB (ref >59)
GFR, EST NON AFRICAN AMERICAN: 32 mL/min/{1.73_m2} — AB (ref >59)
Glucose: 101 mg/dL — ABNORMAL HIGH (ref 65–99)
POTASSIUM: 4.1 mmol/L (ref 3.5–5.2)
Sodium: 145 mmol/L — ABNORMAL HIGH (ref 134–144)

## 2018-07-13 NOTE — Patient Instructions (Signed)
Medication Instructions:  Continue current medications  If you need a refill on your cardiac medications before your next appointment, please call your pharmacy.  Labwork: BMP HERE IN OUR OFFICE AT [facility]  Take the provided lab slips with you to the lab for your blood draw.   You will NOT need to fast   Testing/Procedures: None Ordered   Follow-Up: Your physician wants you to follow-up in: 6 Weeks with [employer].      Thank you for choosing [facility] at [facility]!!

## 2018-07-14 ENCOUNTER — Other Ambulatory Visit: Payer: Self-pay | Admitting: *Deleted

## 2018-07-17 ENCOUNTER — Telehealth: Payer: Self-pay | Admitting: Internal Medicine

## 2018-07-17 NOTE — Telephone Encounter (Unsigned)
Copied from [place] [id number]. Topic: Quick Communication - See Telephone Encounter >> Jul 17, 2018  3:02 PM Neva Seat wrote: Herbert Deaner, PT  w/ [place] - [phone]  Discharge from [place] today 07-17-18.  Pt at maximum potential.  Requesting verbal orders for OT for ADL's

## 2018-07-21 ENCOUNTER — Other Ambulatory Visit: Payer: Self-pay | Admitting: Internal Medicine

## 2018-07-21 ENCOUNTER — Ambulatory Visit: Payer: Medicare Other | Admitting: Cardiology

## 2018-07-21 ENCOUNTER — Encounter: Payer: Self-pay | Admitting: Cardiology

## 2018-07-21 VITALS — BP 129/74 | HR 58 | Ht 67.0 in | Wt 172.6 lb

## 2018-07-21 DIAGNOSIS — I5042 Chronic combined systolic (congestive) and diastolic (congestive) heart failure: Secondary | ICD-10-CM

## 2018-07-21 NOTE — Progress Notes (Signed)
07/21/2018 [name] [name]   [birth year]  [id number]  Primary Physician Plotnikov, Evie Lacks, MD Primary Cardiologist: Dr Percival Spanish  HPI:  84 y/o male here for follow up after office visit 07/13/18 with Dr Percival Spanish. He has been doing well on his current medications. His lab work done 8/26 looked OK. He has some LE edema but otherwise feels like he is doing pretty well. His wgt is stable at 172 lbs. He still has some LE edema but no worse than usual.    Current Outpatient Medications  Medication Sig Dispense Refill  . ALPRAZolam (XANAX) 1 MG tablet Take 1 tablet (1 mg total) by mouth at bedtime. 90 tablet 1  . amiodarone (PACERONE) 200 MG tablet TAKE 1 TABLET ONCE DAILY. 30 tablet 3  . apixaban (ELIQUIS) 2.5 MG TABS tablet Take 1 tablet (2.5 mg total) by mouth 2 (two) times daily. 60 tablet 11  . benzonatate (TESSALON) 100 MG capsule Take 1 capsule (100 mg total) by mouth 3 (three) times daily as needed. (Patient taking differently: Take 100 mg by mouth 3 (three) times daily as needed for cough. ) 20 capsule 0  . diltiazem (CARDIZEM) 30 MG tablet Take 1 tablet (30 mg total) by mouth 2 (two) times daily. 60 tablet 6  . finasteride (PROSCAR) 5 MG tablet Take 1 tablet (5 mg total) by mouth daily. 90 tablet 3  . furosemide (LASIX) 40 MG tablet Take 40 mg by mouth daily.    Marland Kitchen linaclotide (LINZESS) 290 MCG CAPS capsule Take 1 capsule (290 mcg total) by mouth daily before breakfast. (Patient taking differently: Take 28 mcg by mouth daily as needed (constipation). ) 28 capsule 0  . polyethylene glycol (MIRALAX / GLYCOLAX) packet Take 17 g by mouth at bedtime.    . pravastatin (PRAVACHOL) 20 MG tablet TAKE 1 TABLET EACH DAY. 30 tablet 11  . ranitidine (ZANTAC) 150 MG tablet Take 1 tablet (150 mg total) by mouth 2 (two) times daily as needed for heartburn. 180 tablet 3  . senna (SENOKOT) 8.6 MG TABS tablet Take 1-2 tablets (8.6-17.2 mg total) by mouth at bedtime. 120 each 11  . traMADol (ULTRAM) 50 MG  tablet Take 0.5-1 tablets (25-50 mg total) by mouth every 6 (six) hours as needed for severe pain. 20 tablet 0  . VENTOLIN HFA 108 (90 Base) MCG/ACT inhaler Inhale 2 puffs into the lungs every 6 (six) hours as needed for wheezing or shortness of breath.   0   No current facility-administered medications for this visit.     Allergies  Allergen Reactions  . Lisinopril Cough    cough    Past Medical History:  Diagnosis Date  . Anxiety   . Arthritis    "shoulders" (05/28/2016)  . Cardiomyopathy   . Coronary artery disease   . Depression    hx (05/28/2016)  . Hemorrhoids   . Hyperlipidemia   . Hypertension   . Paroxysmal atrial fibrillation (HCC)   . Skipped heart beats     Social History   Socioeconomic History  . Marital status: Widowed    Spouse name: Not on file  . Number of children: 1  . Years of education: Not on file  . Highest education level: Not on file  Occupational History  . Occupation: Retired  Scientific laboratory technician  . Financial resource strain: Not on file  . Food insecurity:    Worry: Not on file    Inability: Not on file  . Transportation needs:  Medical: Not on file    Non-medical: Not on file  Tobacco Use  . Smoking status: Never Smoker  . Smokeless tobacco: Never Used  Substance and Sexual Activity  . Alcohol use: No  . Drug use: No  . Sexual activity: Yes  Lifestyle  . Physical activity:    Days per week: Not on file    Minutes per session: Not on file  . Stress: Not on file  Relationships  . Social connections:    Talks on phone: Not on file    Gets together: Not on file    Attends religious service: Not on file    Active member of club or organization: Not on file    Attends meetings of clubs or organizations: Not on file    Relationship status: Not on file  . Intimate partner violence:    Fear of current or ex partner: Not on file    Emotionally abused: Not on file    Physically abused: Not on file    Forced sexual activity: Not on file   Other Topics Concern  . Not on file  Social History Narrative  . Not on file     Family History  Problem Relation Age of Onset  . Heart disease Brother      Review of Systems: General: negative for chills, fever, night sweats or weight changes.  Cardiovascular: negative for chest pain, dyspnea on exertion, orthopnea, palpitations, paroxysmal nocturnal dyspnea or shortness of breath Dermatological: negative for rash Respiratory: negative for cough or wheezing Urologic: negative for hematuria Abdominal: negative for nausea, vomiting, diarrhea, bright red blood per rectum, melena, or hematemesis Neurologic: negative for visual changes, syncope, or dizziness All other systems reviewed and are otherwise negative except as noted above.    Blood pressure 129/74, pulse (!) 58, height 5\' 7"  (1.702 m), weight 172 lb 9.6 oz (78.3 kg).  General appearance: alert, cooperative and no distress Lungs: decreased Rt base Heart: irregularly irregular rhythm Extremities: 1+ edmema Neurologic: Grossly normal   ASSESSMENT AND PLAN:   Chronic combined systolic and diastolic CHF (congestive heart failure) (HCC) He appears stable, some of his LE edema may be from Diltiazem.   Atrial fibrillation with RVR ([place]) He is on Amiodarone ow for rate control. He has had a tendency to bradycardia when in NSR, he is not on a beta blocker.  Congestive dilated cardiomyopathy (HCC) EF 20% by TEE May 2019 when he was in AF  CKD (chronic kidney disease) stage 3, GFR 30-59 ml/min (HCC) GFR 28- SCr 1.77- 07/13/18  Chronic anticoagulation Low dose Eliquis  PLAN  Same Rx- keep f/u in Oct.  Kerin Ransom PA-C 07/21/2018 10:27 AM

## 2018-07-21 NOTE — Patient Instructions (Signed)
Medication Instructions:  No Changes.  If you need a refill on your cardiac medications before your next appointment, please call your pharmacy.  Labwork: None Ordered.  Testing/Procedures: None Ordered.   Follow-Up: Your physician recommends that you keep appointment as scheduled: with Kerin Ransom PA-C in October.    Thank you for choosing [facility] at [facility]!!

## 2018-07-21 NOTE — Telephone Encounter (Signed)
Patient called requesting a refill on ALPRAZolam (XANAX) 1 MG tablet. He would like to pick up a written paper script for this instead of having it sent electronically.  If this can be done, please call patient to let him know when it is ready.

## 2018-07-22 NOTE — Telephone Encounter (Signed)
Ok w/3 refills Thx

## 2018-07-22 NOTE — Telephone Encounter (Signed)
OK. Thx

## 2018-07-22 NOTE — Telephone Encounter (Signed)
LM giving verbals, FYI 

## 2018-07-23 NOTE — Telephone Encounter (Signed)
I called pt- he would now like Alprazolam sent electronically.

## 2018-07-27 ENCOUNTER — Telehealth: Payer: Self-pay | Admitting: Internal Medicine

## 2018-07-27 NOTE — Telephone Encounter (Signed)
Copied from [place] [id number]. Topic: Quick Communication - See Telephone Encounter >> Jul 27, 2018  4:01 PM Percell Belt A wrote: CRM for notification. See Telephone encounter for: 07/27/18.  Manuela Schwartz from [facility] -[phone] Need verbal for OT  1 week 3

## 2018-07-27 NOTE — Telephone Encounter (Signed)
Ok Thx 

## 2018-07-27 NOTE — Telephone Encounter (Signed)
Verbals given, FYI 

## 2018-07-28 MED ORDER — ALPRAZOLAM 1 MG PO TABS: 1.0000 mg | ORAL_TABLET | Freq: Every day | ORAL | 1 refills | Status: DC

## 2018-07-28 NOTE — Telephone Encounter (Signed)
Called pharmacy to see if rx for alprazolam was received spoke w/pharmacist Maudie Mercury they have not received. Gave verbal w/ MD authorization../l;mb

## 2018-07-29 DIAGNOSIS — I739 Peripheral vascular disease, unspecified: Secondary | ICD-10-CM | POA: Diagnosis not present

## 2018-07-29 DIAGNOSIS — M79674 Pain in right toe(s): Secondary | ICD-10-CM | POA: Diagnosis not present

## 2018-07-29 DIAGNOSIS — M79675 Pain in left toe(s): Secondary | ICD-10-CM | POA: Diagnosis not present

## 2018-07-29 DIAGNOSIS — B351 Tinea unguium: Secondary | ICD-10-CM | POA: Diagnosis not present

## 2018-08-03 ENCOUNTER — Telehealth: Payer: Self-pay | Admitting: Cardiology

## 2018-08-03 ENCOUNTER — Telehealth: Payer: Self-pay | Admitting: Internal Medicine

## 2018-08-03 NOTE — Telephone Encounter (Signed)
Copied from [place] [phone]. Topic: General - Other >> Aug 03, 2018  2:14 PM Judyann Munson wrote: Reason for CRM:  Dorian Pod- [facility] Patient complaining of dry cough for the past two days.   Best contact number: [phone]

## 2018-08-03 NOTE — Telephone Encounter (Signed)
New Message   Patient is complaining of dry cough last 2 days. He weighs today 171.5 last week it was 167.5.   Pt c/o swelling: STAT is pt has developed SOB within 24 hours  1) How much weight have you gained and in what time span? 5lbs in one week   2) If swelling, where is the swelling located? No more than usually   3) Are you currently taking a fluid pill? yes  4) Are you currently SOB?   5) Do you have a log of your daily weights (if so, list)? 171.5, 167.5  6) Have you gained 3 pounds in a day or 5 pounds in a week? 5lbs in one week   7) Have you traveled recently? No    Margaretha Sheffield with [facility] is calling on behalf of patient stating that he is complaining of a dry cough for the past 2 days as well as weight gain. Please call.

## 2018-08-03 NOTE — Telephone Encounter (Signed)
S/W Margaretha Sheffield she states that today is her last day there visiting pt, she states that pt weight is up 5# and was wondering if pt should increase his lasix or what should she do. Informed Margaretha Sheffield to have pt take extra 20mg  lasix 20mg  x2days and call back if weight does not come off, ok to extend home care visits? Told her yes to call back/return tomorrow or Wednesday to check and see if weight came off. We will call back if MD has any further orders.

## 2018-08-04 NOTE — Telephone Encounter (Signed)
Please advise 

## 2018-08-05 ENCOUNTER — Telehealth: Payer: Self-pay | Admitting: Cardiology

## 2018-08-05 NOTE — Telephone Encounter (Signed)
Pt and [place] notified, pt is going to wait until his appt on Monday with Dr. Alain Marion to see anyone.

## 2018-08-05 NOTE — Telephone Encounter (Signed)
OV w/any provider Use OTC Robutussin Thx

## 2018-08-05 NOTE — Telephone Encounter (Signed)
New Message         Margaretha Sheffield with [facility] call today states that patient denied the home visit today. Per [name] with [facility] states no one should change patient's medication (pills) except for her. Margaretha Sheffield contacted the patient[s son and the son will adjust medication for 2 days. Per Margaretha Sheffield with [facility] they will discharge patient, no further orders. Pls call and advise.

## 2018-08-10 ENCOUNTER — Ambulatory Visit: Payer: Medicare Other | Admitting: Internal Medicine

## 2018-08-10 ENCOUNTER — Encounter: Payer: Self-pay | Admitting: Internal Medicine

## 2018-08-10 VITALS — BP 122/66 | HR 85 | Temp 97.8°F | Ht 67.0 in | Wt 172.0 lb

## 2018-08-10 DIAGNOSIS — N183 Chronic kidney disease, stage 3 unspecified: Secondary | ICD-10-CM

## 2018-08-10 DIAGNOSIS — I5022 Chronic systolic (congestive) heart failure: Secondary | ICD-10-CM | POA: Diagnosis not present

## 2018-08-10 DIAGNOSIS — I480 Paroxysmal atrial fibrillation: Secondary | ICD-10-CM | POA: Diagnosis not present

## 2018-08-10 DIAGNOSIS — Z23 Encounter for immunization: Secondary | ICD-10-CM | POA: Diagnosis not present

## 2018-08-10 DIAGNOSIS — I10 Essential (primary) hypertension: Secondary | ICD-10-CM

## 2018-08-10 DIAGNOSIS — I2510 Atherosclerotic heart disease of native coronary artery without angina pectoris: Secondary | ICD-10-CM | POA: Diagnosis not present

## 2018-08-10 MED ORDER — FINASTERIDE 5 MG PO TABS: 5.0000 mg | ORAL_TABLET | Freq: Every day | ORAL | 3 refills | Status: DC

## 2018-08-10 MED ORDER — LINACLOTIDE 290 MCG PO CAPS: 28.0000 ug | ORAL_CAPSULE | Freq: Every day | ORAL | 3 refills | Status: DC

## 2018-08-10 MED ORDER — FUROSEMIDE 40 MG PO TABS: 40.0000 mg | ORAL_TABLET | Freq: Every day | ORAL | 3 refills | Status: DC

## 2018-08-10 MED ORDER — ALPRAZOLAM 1 MG PO TABS: 1.0000 mg | ORAL_TABLET | Freq: Every day | ORAL | 2 refills | Status: DC

## 2018-08-10 MED ORDER — SENNA 8.6 MG PO TABS: 1.0000 | ORAL_TABLET | Freq: Every day | ORAL | 11 refills | Status: DC

## 2018-08-10 MED ORDER — DILTIAZEM HCL 30 MG PO TABS: 30.0000 mg | ORAL_TABLET | Freq: Two times a day (BID) | ORAL | 3 refills | Status: DC

## 2018-08-10 NOTE — Assessment & Plan Note (Signed)
anticoagulation and amiodarone, Furosemide NAS diet

## 2018-08-10 NOTE — Assessment & Plan Note (Signed)
Eliquis 

## 2018-08-10 NOTE — Progress Notes (Signed)
Subjective:  Patient ID: [name] [name], male    DOB: 1920-09-03  Age: 84 y.o. MRN: [id number]  CC: No chief complaint on file.   HPI [name] [name] presents for constipation, A fib, BPH f/u  Outpatient Medications Prior to Visit  Medication Sig Dispense Refill  . ALPRAZolam (XANAX) 1 MG tablet Take 1 tablet (1 mg total) by mouth at bedtime. 90 tablet 1  . amiodarone (PACERONE) 200 MG tablet TAKE 1 TABLET ONCE DAILY. 30 tablet 3  . apixaban (ELIQUIS) 2.5 MG TABS tablet Take 1 tablet (2.5 mg total) by mouth 2 (two) times daily. 60 tablet 11  . benzonatate (TESSALON) 100 MG capsule Take 1 capsule (100 mg total) by mouth 3 (three) times daily as needed. (Patient taking differently: Take 100 mg by mouth 3 (three) times daily as needed for cough. ) 20 capsule 0  . diltiazem (CARDIZEM) 30 MG tablet Take 1 tablet (30 mg total) by mouth 2 (two) times daily. 60 tablet 6  . finasteride (PROSCAR) 5 MG tablet Take 1 tablet (5 mg total) by mouth daily. 90 tablet 3  . furosemide (LASIX) 40 MG tablet Take 40 mg by mouth daily.    Marland Kitchen linaclotide (LINZESS) 290 MCG CAPS capsule Take 1 capsule (290 mcg total) by mouth daily before breakfast. (Patient taking differently: Take 28 mcg by mouth daily as needed (constipation). ) 28 capsule 0  . polyethylene glycol (MIRALAX / GLYCOLAX) packet Take 17 g by mouth at bedtime.    . pravastatin (PRAVACHOL) 20 MG tablet TAKE 1 TABLET EACH DAY. 30 tablet 11  . ranitidine (ZANTAC) 150 MG tablet Take 1 tablet (150 mg total) by mouth 2 (two) times daily as needed for heartburn. 180 tablet 3  . senna (SENOKOT) 8.6 MG TABS tablet Take 1-2 tablets (8.6-17.2 mg total) by mouth at bedtime. 120 each 11  . traMADol (ULTRAM) 50 MG tablet Take 0.5-1 tablets (25-50 mg total) by mouth every 6 (six) hours as needed for severe pain. 20 tablet 0  . VENTOLIN HFA 108 (90 Base) MCG/ACT inhaler Inhale 2 puffs into the lungs every 6 (six) hours as needed for wheezing or shortness of breath.   0    No facility-administered medications prior to visit.     ROS: Review of Systems  Constitutional: Negative for appetite change, fatigue and unexpected weight change.  HENT: Negative for congestion, nosebleeds, sneezing, sore throat and trouble swallowing.   Eyes: Negative for itching and visual disturbance.  Respiratory: Negative for cough.   Cardiovascular: Negative for chest pain, palpitations and leg swelling.  Gastrointestinal: Positive for diarrhea. Negative for abdominal distention, blood in stool and nausea.  Genitourinary: Negative for frequency and hematuria.  Musculoskeletal: Negative for back pain, gait problem, joint swelling and neck pain.  Skin: Negative for rash.  Neurological: Negative for dizziness, tremors, speech difficulty and weakness.  Psychiatric/Behavioral: Negative for agitation, dysphoric mood, sleep disturbance and suicidal ideas. The patient is not nervous/anxious.     Objective:  BP 122/66 (BP Location: Left Arm, Patient Position: Sitting, Cuff Size: Normal)   Pulse 85   Temp 97.8 F (36.6 C) (Oral)   Ht 5\' 7"  (1.702 m)   Wt 172 lb (78 kg)   SpO2 98%   BMI 26.94 kg/m   BP Readings from Last 3 Encounters:  08/10/18 122/66  07/21/18 129/74  07/13/18 (!) 84/62    Wt Readings from Last 3 Encounters:  08/10/18 172 lb (78 kg)  07/21/18 172 lb 9.6 oz (78.3 kg)  07/13/18 173 lb (78.5 kg)    Physical Exam  Constitutional: He is oriented to person, place, and time. He appears well-developed. No distress.  NAD  HENT:  Mouth/Throat: Oropharynx is clear and moist.  Eyes: Pupils are equal, round, and reactive to light. Conjunctivae are normal.  Neck: Normal range of motion. No JVD present. No thyromegaly present.  Cardiovascular: Normal rate, regular rhythm, normal heart sounds and intact distal pulses. Exam reveals no gallop and no friction rub.  No murmur heard. Pulmonary/Chest: Effort normal and breath sounds normal. No respiratory distress. He  has no wheezes. He has no rales. He exhibits no tenderness.  Abdominal: Soft. Bowel sounds are normal. He exhibits no distension and no mass. There is no tenderness. There is no rebound and no guarding.  Musculoskeletal: Normal range of motion. He exhibits no edema or tenderness.  Lymphadenopathy:    He has no cervical adenopathy.  Neurological: He is alert and oriented to person, place, and time. He has normal reflexes. No cranial nerve deficit. He exhibits normal muscle tone. He displays a negative Romberg sign. Coordination and gait normal.  Skin: Skin is warm and dry. No rash noted.  Psychiatric: He has a normal mood and affect. His behavior is normal. Judgment and thought content normal.   [place] hearing   Lab Results  Component Value Date   WBC 6.2 06/17/2018   HGB 15.8 06/17/2018   HCT 47.1 06/17/2018   PLT 114 (L) 06/17/2018   GLUCOSE 101 (H) 07/13/2018   CHOL 112 03/30/2018   TRIG 80 03/30/2018   HDL 39 (L) 03/30/2018   LDLCALC 57 03/30/2018   ALT 14 (L) 04/04/2018   AST 14 (L) 04/04/2018   NA 145 (H) 07/13/2018   K 4.1 07/13/2018   CL 105 07/13/2018   CREATININE 1.77 (H) 07/13/2018   BUN 28 07/13/2018   CO2 23 07/13/2018   TSH 1.681 03/30/2018   PSA 0.24 03/08/2014   INR 1.32 03/30/2018   HGBA1C 5.7 (H) 03/31/2018    Ct Abdomen Pelvis Wo Contrast  Result Date: 06/15/2018 CLINICAL DATA:  84 year old male with constipation for 3 days. No relief with over the counter stool softeners. Post bilateral inguinal hernia repair, cholecystectomy and appendectomy. Subsequent encounter. EXAM: CT ABDOMEN AND PELVIS WITHOUT CONTRAST TECHNIQUE: Multidetector CT imaging of the abdomen and pelvis was performed following the standard protocol without IV contrast. COMPARISON:  08/16/2018 plain film exam.  04/06/2017 CT. FINDINGS: Lower chest: Bilateral lower lobe and inferior lingula consolidation may represent infiltrates and/or atelectasis. Associated pleural effusion greater on  the right. Small amount of gas within the non dependent aspect of the right atrium and right ventricle. Question if this is related to IV line. Heart slightly enlarged. Hepatobiliary: Taking into account limitation by non contrast imaging, no worrisome hepatic lesion. Post cholecystectomy. Pancreas: Taking into account limitation by non contrast imaging, no worrisome pancreatic lesion or inflammation. Spleen: Taking into account limitation by non contrast imaging, no splenic mass or enlargement. Adrenals/Urinary Tract: Chronic left hydronephrosis with ureteral pelvic junction obstruction pattern unchanged. No right-sided hydronephrosis. Bilateral renal cysts some of which are minimally complex. Taking into account limitation by non contrast imaging, no worrisome renal or adrenal lesion. Noncontrast filled imaging of the urinary bladder unremarkable. Stomach/Bowel: Cecum and appendix extend into right inguinal canal. The terminal ileum is just above the herniated bowel. Fluid and gas-filled top-normal size small bowel loops without evidence of obstruction. If there were further herniation of colon into the right inguinal  canal, it is possible this could cause small-bowel obstruction. Stool and gas throughout the colon without obstruction noted. Stomach unremarkable. Vascular/Lymphatic: Atherosclerotic changes aorta and aortic branch vessels. No abdominal aortic aneurysm. Scattered normal size lymph nodes. Reproductive: No worrisome abnormality. Other: No free intraperitoneal air.  No drainable fluid collection. Musculoskeletal: Degenerative changes throughout the lower thoracic and lumbar spine most notable L4-5 level. Hip joint degenerative changes. Sacroiliac joint degenerative changes. IMPRESSION: 1. Small amount of gas within the non dependent aspect of the right atrium and right ventricle. Question if this is related to IV line. 2. Cecum and appendix extend into right inguinal canal. The terminal ileum is just  above the herniated bowel. Fluid and gas-filled top-normal size small bowel loops without evidence of obstruction. If there were further herniation of colon into the right inguinal canal, it is possible this could cause small-bowel obstruction. 3. Stool and gas throughout the colon without obstruction noted. 4. Bilateral lower lobe and inferior lingula consolidation may represent infiltrates and/or atelectasis. Associated pleural effusion greater on the right. 5. Chronic left hydronephrosis with ureteral pelvic junction obstruction pattern unchanged. Bilateral renal cysts similar to prior exam. 6.  Aortic Atherosclerosis (ICD10-I70.0). These results will be called to the ordering clinician or representative by the Radiologist Assistant, and communication documented in the PACS or zVision Dashboard. Electronically Signed   By: Genia Del M.D.   On: 06/15/2018 07:24   Dg Abd Acute W/chest  Result Date: 06/15/2018 CLINICAL DATA:  Constipation for 3 days. EXAM: DG ABDOMEN ACUTE W/ 1V CHEST COMPARISON:  Chest radiograph Mar 30, 2018 FINDINGS: Stable cardiomegaly. Calcified aortic knob. Small to moderate pleural layering pleural effusions, decreased on the RIGHT. Patchy LEFT mid lung zone airspace opacity. No pneumothorax. Osteopenia. Severe degenerative change of the shoulders. Old RIGHT rib fractures. Gas-filled mildly distended small large bowel, small bowel measures to 3.4 cm, large bowel measures to 7.9 cm. Moderate amount of retained large bowel stool. Surgical clips in the included right abdomen compatible with cholecystectomy. No intra-abdominal mass effect, pathologic calcifications or free air. Soft tissue planes and included osseous structures are non-suspicious. Osteopenia. IMPRESSION: Stable cardiomegaly with small to moderate pleural effusions. Focal consolidation LEFT mid lung zone. Mild ileus and moderate amount of retained large bowel stool. Electronically Signed   By: Elon Alas M.D.   On:  06/15/2018 04:09    Assessment & Plan:   There are no diagnoses linked to this encounter.   No orders of the defined types were placed in this encounter.    Follow-up: No follow-ups on file.  Walker Kehr, MD

## 2018-08-10 NOTE — Assessment & Plan Note (Signed)
Toprol, Furosemide 

## 2018-08-10 NOTE — Assessment & Plan Note (Signed)
Labs

## 2018-08-10 NOTE — Addendum Note (Signed)
Addended by: Karren Cobble on: 08/10/2018 08:56 AM   Modules accepted: Orders

## 2018-08-10 NOTE — Assessment & Plan Note (Signed)
Eliquis, Pravastatin 

## 2018-08-12 ENCOUNTER — Other Ambulatory Visit: Payer: Self-pay | Admitting: Internal Medicine

## 2018-08-27 ENCOUNTER — Ambulatory Visit: Payer: Medicare Other | Admitting: Cardiology

## 2018-08-27 ENCOUNTER — Encounter: Payer: Self-pay | Admitting: Cardiology

## 2018-08-27 VITALS — BP 132/81 | HR 91 | Ht 67.5 in | Wt 174.0 lb

## 2018-08-27 DIAGNOSIS — I5042 Chronic combined systolic (congestive) and diastolic (congestive) heart failure: Secondary | ICD-10-CM

## 2018-08-27 DIAGNOSIS — I420 Dilated cardiomyopathy: Secondary | ICD-10-CM | POA: Diagnosis not present

## 2018-08-27 DIAGNOSIS — N183 Chronic kidney disease, stage 3 unspecified: Secondary | ICD-10-CM

## 2018-08-27 DIAGNOSIS — I482 Chronic atrial fibrillation, unspecified: Secondary | ICD-10-CM

## 2018-08-27 NOTE — Patient Instructions (Addendum)
Medication Instructions:  Kerin Ransom, PA-C recommends that you continue on your current medications as directed. Please refer to the Current Medication list given to you today.  If you need a refill on your cardiac medications before your next appointment, please call your pharmacy.   Lab work: NONE ORDERED  If you have labs (blood work) drawn today and your tests are completely normal, you will receive your results only by: Marland Kitchen MyChart Message (if you have MyChart) OR . A paper copy in the mail If you have any lab test that is abnormal or we need to change your treatment, we will call you to review the results.  Testing/Procedures: NONE ORDERED  Follow-Up: At [facility], you and your health needs are our priority.  As part of our continuing mission to provide you with exceptional heart care, we have created designated Provider Care Teams.  These Care Teams include your primary Cardiologist (physician) and Advanced Practice Providers (APPs -  Physician Assistants and Nurse Practitioners) who all work together to provide you with the care you need, when you need it. You will need a follow up appointment in 2 months.  Please call our office 2 months in advance to schedule this appointment.  You may see Minus Breeding, MD or one of the following Advanced Practice Providers on your designated Care Team:   Rosaria Ferries, PA-C . Jory Sims, DNP, ANP

## 2018-08-27 NOTE — Progress Notes (Signed)
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 He had been controlled with Amiodarone 200 mg 3 times a week. This was cut back to 100 mg three times a week in Feb 2019. He presented to the ED in May 2019 with CHF, AKI, AF with RVR and a weight of 192 lbs, almost 20 lbs over his baseline. He was admitted then and placed on Diltiazem for rate control. He was re loaded with Amiodaroneandunderwent TEE CV to NSR. When dischargedhome his weight was down to 173 lbs. When seen in follow up he was back in AF and his weight was up. Diltiazem was added for rate control and his diuretic increased. The plan has been to try and avoid hospitalization. Dr Percival Spanish has previously offered Hospice but the patient declined. His LOV was 07/21/18 and he was stable. He is back today for follow up. He tells me [employer] has arranged for a home monitoring system. They call him each am and monitor his weights. Symptomatically he is stable.    Current Outpatient Medications  Medication Sig Dispense Refill  . ALPRAZolam (XANAX) 1 MG tablet Take 1 tablet (1 mg total) by mouth at bedtime. 90 tablet 2  . amiodarone (PACERONE) 200 MG tablet TAKE 1 TABLET ONCE DAILY. 30 tablet 5  . apixaban (ELIQUIS) 2.5 MG TABS tablet Take 1 tablet (2.5 mg total) by mouth 2 (two) times daily. 60 tablet 11  . diltiazem (CARDIZEM) 30 MG tablet Take 1 tablet (30 mg total) by mouth 2 (two) times daily. 180 tablet 3  . finasteride (PROSCAR) 5 MG tablet Take 1 tablet (5 mg total) by mouth daily. 90 tablet 3  . furosemide (LASIX) 40 MG tablet Take 1 tablet (40 mg total) by mouth daily. 90 tablet 3  . linaclotide (LINZESS) 290 MCG CAPS capsule Take 1 capsule (290 mcg total) by mouth daily before  breakfast. 90 capsule 3  . polyethylene glycol (MIRALAX / GLYCOLAX) packet Take 17 g by mouth at bedtime.    . pravastatin (PRAVACHOL) 20 MG tablet TAKE 1 TABLET EACH DAY. 30 tablet 11  . ranitidine (ZANTAC) 150 MG tablet Take 1 tablet (150 mg total) by mouth 2 (two) times daily as needed for heartburn. 180 tablet 3  . senna (SENOKOT) 8.6 MG TABS tablet Take 1-2 tablets (8.6-17.2 mg total) by mouth at bedtime. 120 each 11  . traMADol (ULTRAM) 50 MG tablet Take 0.5-1 tablets (25-50 mg total) by mouth every 6 (six) hours as needed for severe pain. 20 tablet 0  . VENTOLIN HFA 108 (90 Base) MCG/ACT inhaler Inhale 2 puffs into the lungs every 6 (six) hours as needed for wheezing or shortness of breath.   0   No current facility-administered medications for this visit.     Allergies  Allergen Reactions  . Lisinopril Cough    cough    Past Medical History:  Diagnosis Date  . Anxiety   . Arthritis    "shoulders" (05/28/2016)  . Cardiomyopathy   . Coronary artery disease   . Depression    hx (05/28/2016)  . Hemorrhoids   . Hyperlipidemia   . Hypertension   . Paroxysmal atrial fibrillation (HCC)   . Skipped heart beats  Social History   Socioeconomic History  . Marital status: Widowed    Spouse name: Not on file  . Number of children: 1  . Years of education: Not on file  . Highest education level: Not on file  Occupational History  . Occupation: Retired  Scientific laboratory technician  . Financial resource strain: Not on file  . Food insecurity:    Worry: Not on file    Inability: Not on file  . Transportation needs:    Medical: Not on file    Non-medical: Not on file  Tobacco Use  . Smoking status: Never Smoker  . Smokeless tobacco: Never Used  Substance and Sexual Activity  . Alcohol use: No  . Drug use: No  . Sexual activity: Yes  Lifestyle  . Physical activity:    Days per week: Not on file    Minutes per session: Not on file  . Stress: Not on file  Relationships  . Social  connections:    Talks on phone: Not on file    Gets together: Not on file    Attends religious service: Not on file    Active member of club or organization: Not on file    Attends meetings of clubs or organizations: Not on file    Relationship status: Not on file  . Intimate partner violence:    Fear of current or ex partner: Not on file    Emotionally abused: Not on file    Physically abused: Not on file    Forced sexual activity: Not on file  Other Topics Concern  . Not on file  Social History Narrative  . Not on file     Family History  Problem Relation Age of Onset  . Heart disease Brother      Review of Systems: General: negative for chills, fever, night sweats or weight changes.  Cardiovascular: negative for chest pain, dyspnea on exertion, edema, orthopnea, palpitations, paroxysmal nocturnal dyspnea or shortness of breath Dermatological: negative for rash Respiratory: negative for cough or wheezing Urologic: negative for hematuria Abdominal: negative for nausea, vomiting, diarrhea, bright red blood per rectum, melena, or hematemesis Neurologic: negative for visual changes, syncope, or dizziness All other systems reviewed and are otherwise negative except as noted above.    Blood pressure 132/81, pulse 91, height 5' 7.5" (1.715 m), weight 174 lb (78.9 kg).  General appearance: alert, cooperative and no distress Lungs: decreased breath sounds Rt base Heart: irregularly irregular rhythm Extremities: 1+ LE edema Neurologic: Grossly normal   ASSESSMENT AND PLAN:   Chronic combined systolic and diastolic CHF (congestive heart failure) (HCC) He appears stable, some of his LE edema may be from Diltiazem.  Atrial fibrillation- now chronic- HR 80 by me He is on Amiodarone ow for rate control. He has had a tendency to bradycardia when in NSR, he is not on a beta blocker.  Congestive dilated cardiomyopathy (HCC) EF 20% by TEE May 2019 when he was in AF  CKD  (chronic kidney disease) stage 3, GFR 30-59 ml/min (HCC) GFR 28- SCr 1.77- 07/13/18  Chronic anticoagulation Low dose Eliquis  PLAN  Same Rx. F/U Dr Percival Spanish in 2-3 months.   Kerin Ransom PA-C 08/27/2018 10:35 AM

## 2018-09-15 ENCOUNTER — Ambulatory Visit: Payer: Medicare Other | Admitting: Cardiology

## 2018-09-15 ENCOUNTER — Encounter: Payer: Self-pay | Admitting: Cardiology

## 2018-09-15 VITALS — BP 128/74 | HR 58 | Ht 67.5 in | Wt 173.0 lb

## 2018-09-15 DIAGNOSIS — I482 Chronic atrial fibrillation, unspecified: Secondary | ICD-10-CM | POA: Diagnosis not present

## 2018-09-15 DIAGNOSIS — I5042 Chronic combined systolic (congestive) and diastolic (congestive) heart failure: Secondary | ICD-10-CM | POA: Diagnosis not present

## 2018-09-15 DIAGNOSIS — N183 Chronic kidney disease, stage 3 unspecified: Secondary | ICD-10-CM

## 2018-09-15 DIAGNOSIS — Z7901 Long term (current) use of anticoagulants: Secondary | ICD-10-CM

## 2018-09-15 DIAGNOSIS — I420 Dilated cardiomyopathy: Secondary | ICD-10-CM

## 2018-09-15 NOTE — Patient Instructions (Signed)
Medication Instructions:  Your physician recommends that you continue on your current medications as directed. Please refer to the Current Medication list given to you today.  If you need a refill on your cardiac medications before your next appointment, please call your pharmacy.   Lab work: None  If you have labs (blood work) drawn today and your tests are completely normal, you will receive your results only by: Marland Kitchen MyChart Message (if you have MyChart) OR . A paper copy in the mail If you have any lab test that is abnormal or we need to change your treatment, we will call you to review the results.  Testing/Procedures: None   Follow-Up: At [facility], you and your health needs are our priority.  As part of our continuing mission to provide you with exceptional heart care, we have created designated Provider Care Teams.  These Care Teams include your primary Cardiologist (physician) and Advanced Practice Providers (APPs -  Physician Assistants and Nurse Practitioners) who all work together to provide you with the care you need, when you need it. . Your physician recommends that you schedule a follow-up appointment in: 3 MONTHS with DR [facility].  Any Other Special Instructions Will Be Listed Below (If Applicable).

## 2018-09-15 NOTE — Progress Notes (Signed)
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 He was admitted in May 2019 with recurrent AF, AKI, and CHF. His weight was up to 192 lbs, almost 20 lbs over his baseline. He was admitted and placed on Diltiazem for rate control. He was re loaded with Amiodarone. He underwent TEE CV to NSR. When discharged home his weight was down to 173 lbs. As an OP he has gone back into atrial fibrillation but he has tolerated this better. His rate has been controlled and his weight remains stable. He is on Amiodarone for rate control. Since I saw him last he has done well. He has a home health service that checks on him an monitors his weight. 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    Current Outpatient Medications  Medication Sig Dispense Refill  . ALPRAZolam (XANAX) 1 MG tablet Take 1 tablet (1 mg total) by mouth at bedtime. 90 tablet 2  . amiodarone (PACERONE) 200 MG tablet TAKE 1 TABLET ONCE DAILY. 30 tablet 5  . apixaban (ELIQUIS) 2.5 MG TABS tablet Take 1 tablet (2.5 mg total) by mouth 2 (two) times daily. 60 tablet 11  . diltiazem (CARDIZEM) 30 MG tablet Take 1 tablet (30 mg total) by mouth 2 (two) times daily. 180 tablet 3  . finasteride (PROSCAR) 5 MG tablet Take 1 tablet (5 mg total) by mouth daily. 90 tablet 3  . furosemide (LASIX) 40 MG tablet Take 1 tablet (40 mg total) by mouth daily. 90 tablet 3  . linaclotide (LINZESS) 290 MCG CAPS capsule Take 1 capsule (290 mcg total) by mouth daily before breakfast. 90 capsule 3  . polyethylene glycol (MIRALAX / GLYCOLAX) packet Take 17 g by mouth at bedtime.    . pravastatin (PRAVACHOL) 20 MG tablet TAKE 1 TABLET EACH DAY. 30 tablet 11  . ranitidine (ZANTAC) 150 MG tablet Take 1 tablet (150 mg total) by mouth 2 (two) times daily as  needed for heartburn. 180 tablet 3  . senna (SENOKOT) 8.6 MG TABS tablet Take 1-2 tablets (8.6-17.2 mg total) by mouth at bedtime. 120 each 11  . traMADol (ULTRAM) 50 MG tablet Take 0.5-1 tablets (25-50 mg total) by mouth every 6 (six) hours as needed for severe pain. 20 tablet 0  . VENTOLIN HFA 108 (90 Base) MCG/ACT inhaler Inhale 2 puffs into the lungs every 6 (six) hours as needed for wheezing or shortness of breath.   0   No current facility-administered medications for this visit.     Allergies  Allergen Reactions  . Lisinopril Cough    cough    Past Medical History:  Diagnosis Date  . Anxiety   . Arthritis    "shoulders" (05/28/2016)  . Cardiomyopathy   . Coronary artery disease   . Depression    hx (05/28/2016)  . Hemorrhoids   . Hyperlipidemia   . Hypertension   . Paroxysmal atrial fibrillation (HCC)   . Skipped heart beats     Social History   Socioeconomic History  . Marital status: Widowed    Spouse name: Not on file  . Number of children: 1  . Years of education: Not on file  . Highest education level: Not on file  Occupational History  .  Occupation: Retired  Scientific laboratory technician  . Financial resource strain: Not on file  . Food insecurity:    Worry: Not on file    Inability: Not on file  . Transportation needs:    Medical: Not on file    Non-medical: Not on file  Tobacco Use  . Smoking status: Never Smoker  . Smokeless tobacco: Never Used  Substance and Sexual Activity  . Alcohol use: No  . Drug use: No  . Sexual activity: Yes  Lifestyle  . Physical activity:    Days per week: Not on file    Minutes per session: Not on file  . Stress: Not on file  Relationships  . Social connections:    Talks on phone: Not on file    Gets together: Not on file    Attends religious service: Not on file    Active member of club or organization: Not on file    Attends meetings of clubs or organizations: Not on file    Relationship status: Not on file  . Intimate  partner violence:    Fear of current or ex partner: Not on file    Emotionally abused: Not on file    Physically abused: Not on file    Forced sexual activity: Not on file  Other Topics Concern  . Not on file  Social History Narrative  . Not on file     Family History  Problem Relation Age of Onset  . Heart disease Brother      Review of Systems: General: negative for chills, fever, night sweats or weight changes.  Cardiovascular: negative for chest pain, dyspnea on exertion, edema, orthopnea, palpitations, paroxysmal nocturnal dyspnea or shortness of breath Dermatological: negative for rash Respiratory: negative for cough or wheezing Urologic: negative for hematuria Abdominal: negative for nausea, vomiting, diarrhea, bright red blood per rectum, melena, or hematemesis Neurologic: negative for visual changes, syncope, or dizziness All other systems reviewed and are otherwise negative except as noted above.    Blood pressure 128/74, pulse (!) 58, height 5' 7.5" (1.715 m), weight 173 lb (78.5 kg), SpO2 98 %.  General appearance: alert, cooperative and no distress Lungs: clear to auscultation bilaterally Heart: irregularly irregular rhythm Extremities: 1+ bilateral edema Skin: cool and dry Neurologic: Grossly normal   ASSESSMENT AND PLAN:   Chronic combined systolic and diastolic CHF (congestive heart failure) (HCC) Weight now 172 lbs and stable. He has some LE edema which we will tolerate as long as it doesn't bother him too much.   Atrial fibrillation with RVR ([place]) Now I suspect he has CAF. Continue Amiodarone for rate control.   Congestive dilated cardiomyopathy (HCC) EF 20% by TEE May 2019 when he was in AF-no point in repeating this  CKD (chronic kidney disease) stage 3, GFR 30-59 ml/min (HCC) GFR 28- SCr 1.77, GFR 32 Aug 2019  Chronic anticoagulation Low dose Eliquis   PLAN  No change in medical Rx. F/U with Dr Percival Spanish in Jan 2020.   Kerin Ransom  PA-C 09/15/2018 3:35 PM

## 2018-09-19 ENCOUNTER — Encounter (HOSPITAL_COMMUNITY): Payer: Self-pay | Admitting: Internal Medicine

## 2018-09-19 ENCOUNTER — Emergency Department (HOSPITAL_COMMUNITY): Payer: Medicare Other

## 2018-09-19 ENCOUNTER — Telehealth: Payer: Self-pay | Admitting: Physician Assistant

## 2018-09-19 ENCOUNTER — Emergency Department (HOSPITAL_COMMUNITY)
Admission: EM | Admit: 2018-09-19 | Discharge: 2018-09-19 | Disposition: A | Discharge status: Home / Self Care | Payer: Medicare Other | Attending: Emergency Medicine | Admitting: Emergency Medicine

## 2018-09-19 DIAGNOSIS — Y939 Activity, unspecified: Secondary | ICD-10-CM | POA: Diagnosis not present

## 2018-09-19 DIAGNOSIS — W109XXA Fall (on) (from) unspecified stairs and steps, initial encounter: Secondary | ICD-10-CM | POA: Insufficient documentation

## 2018-09-19 DIAGNOSIS — Y999 Unspecified external cause status: Secondary | ICD-10-CM | POA: Diagnosis not present

## 2018-09-19 DIAGNOSIS — M79641 Pain in right hand: Secondary | ICD-10-CM | POA: Diagnosis not present

## 2018-09-19 DIAGNOSIS — S0993XA Unspecified injury of face, initial encounter: Secondary | ICD-10-CM | POA: Diagnosis not present

## 2018-09-19 DIAGNOSIS — S0511XA Contusion of eyeball and orbital tissues, right eye, initial encounter: Secondary | ICD-10-CM | POA: Diagnosis not present

## 2018-09-19 DIAGNOSIS — R52 Pain, unspecified: Secondary | ICD-10-CM | POA: Diagnosis not present

## 2018-09-19 DIAGNOSIS — H5710 Ocular pain, unspecified eye: Secondary | ICD-10-CM | POA: Diagnosis not present

## 2018-09-19 DIAGNOSIS — I130 Hypertensive heart and chronic kidney disease with heart failure and stage 1 through stage 4 chronic kidney disease, or unspecified chronic kidney disease: Secondary | ICD-10-CM | POA: Insufficient documentation

## 2018-09-19 DIAGNOSIS — W19XXXA Unspecified fall, initial encounter: Secondary | ICD-10-CM

## 2018-09-19 DIAGNOSIS — Y92094 Garage of other non-institutional residence as the place of occurrence of the external cause: Secondary | ICD-10-CM | POA: Insufficient documentation

## 2018-09-19 DIAGNOSIS — S0083XA Contusion of other part of head, initial encounter: Secondary | ICD-10-CM | POA: Diagnosis not present

## 2018-09-19 DIAGNOSIS — S0990XA Unspecified injury of head, initial encounter: Secondary | ICD-10-CM | POA: Diagnosis not present

## 2018-09-19 DIAGNOSIS — N183 Chronic kidney disease, stage 3 (moderate): Secondary | ICD-10-CM | POA: Insufficient documentation

## 2018-09-19 DIAGNOSIS — S61411A Laceration without foreign body of right hand, initial encounter: Secondary | ICD-10-CM

## 2018-09-19 DIAGNOSIS — I5042 Chronic combined systolic (congestive) and diastolic (congestive) heart failure: Secondary | ICD-10-CM | POA: Diagnosis not present

## 2018-09-19 DIAGNOSIS — S6991XA Unspecified injury of right wrist, hand and finger(s), initial encounter: Secondary | ICD-10-CM | POA: Diagnosis not present

## 2018-09-19 DIAGNOSIS — Z79899 Other long term (current) drug therapy: Secondary | ICD-10-CM | POA: Diagnosis not present

## 2018-09-19 MED ORDER — CEPHALEXIN 500 MG PO CAPS: 500.0000 mg | ORAL_CAPSULE | Freq: Four times a day (QID) | ORAL | 0 refills | Status: DC

## 2018-09-19 NOTE — ED Notes (Signed)
Patient states that he has a ride home. Pt placed in lobby to wait. Nurse first made aware.

## 2018-09-19 NOTE — ED Notes (Signed)
Patient transported to X-ray/CT scans 

## 2018-09-19 NOTE — ED Notes (Signed)
Patient verbalizes understanding of discharge instructions. Opportunity for questioning and answers were provided. Armband removed by staff, pt discharged from ED.  

## 2018-09-19 NOTE — Discharge Instructions (Addendum)
Your CT's were reassuring.  We were unable to close your laceration, given the length from when it originally happened. Please take all of your antibiotics until finished!   You may develop abdominal discomfort or diarrhea from the antibiotic.  You may help offset this with probiotics which you can buy or get in yogurt. Do not eat or take the probiotics until 2 hours after your antibiotic. Do not take your medicine if develop an itchy rash, swelling in your mouth or lips, or difficulty breathing. Please follow attached handout on wound care. Your xrays were reassuring. Please follow up with your pcp within 1 week. If you develop worsening or new concerning symptoms you can return to the emergency department for re-evaluation.
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
## 2018-09-19 NOTE — ED Triage Notes (Signed)
Pt here for evaluation of eye contusion after falling yesterday onto concrete outside of his carport. Pt struck his face and was able to stand up on his own. No LOC. On Eliquis for a-fib hx. VSS. Pt is blind in his right eye and reports that his vision is "more blurry than usual" in his right eye.

## 2018-09-19 NOTE — ED Notes (Signed)
Patient verbalizes that he "cannot wait any longer and that he needs to go home." This RN spoke to [employer] and reassured the patient that the PA would speak with him as soon as his scans were read by the radiologist. Patient verbalized understanding of this RN's statements and agreed to plan.
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
## 2018-09-30 ENCOUNTER — Ambulatory Visit: Payer: Medicare Other | Admitting: Internal Medicine

## 2018-09-30 ENCOUNTER — Encounter: Payer: Self-pay | Admitting: Internal Medicine

## 2018-09-30 ENCOUNTER — Telehealth: Payer: Self-pay | Admitting: Cardiology

## 2018-09-30 VITALS — BP 128/76 | HR 62 | Temp 98.0°F | Ht 67.5 in | Wt 176.0 lb

## 2018-09-30 DIAGNOSIS — I420 Dilated cardiomyopathy: Secondary | ICD-10-CM | POA: Diagnosis not present

## 2018-09-30 DIAGNOSIS — I5022 Chronic systolic (congestive) heart failure: Secondary | ICD-10-CM | POA: Diagnosis not present

## 2018-09-30 DIAGNOSIS — S0511XD Contusion of eyeball and orbital tissues, right eye, subsequent encounter: Secondary | ICD-10-CM | POA: Diagnosis not present

## 2018-09-30 DIAGNOSIS — J90 Pleural effusion, not elsewhere classified: Secondary | ICD-10-CM

## 2018-09-30 MED ORDER — TORSEMIDE 10 MG PO TABS: 10.0000 mg | ORAL_TABLET | Freq: Every day | ORAL | 11 refills | Status: DC

## 2018-09-30 NOTE — Telephone Encounter (Signed)
Returned call to patient.He stated he wanted Dr.Hochrein to know PCP stopped Lasix today and started him on Torsemide 10 mg daily.He wanted to make sure Dr.Hochrein agreed.Message sent to [place] for advice.

## 2018-09-30 NOTE — Progress Notes (Signed)
Subjective:  Patient ID: [name] [name], male    DOB: July 28, 1920  Age: 84 y.o. MRN: [id number]  CC: No chief complaint on file.   HPI Javier Gell presents for ER f/u - fell on concrete on 09/19/18 - head contusion, R hand laceration. CT/X rays - no fx. Cerv CT - "Upper chest: Bilateral large pleural effusions are noted. No focal infiltrate is seen."  Outpatient Medications Prior to Visit  Medication Sig Dispense Refill  . ALPRAZolam (XANAX) 1 MG tablet Take 1 tablet (1 mg total) by mouth at bedtime. 90 tablet 2  . amiodarone (PACERONE) 200 MG tablet TAKE 1 TABLET ONCE DAILY. 30 tablet 5  . apixaban (ELIQUIS) 2.5 MG TABS tablet Take 1 tablet (2.5 mg total) by mouth 2 (two) times daily. 60 tablet 11  . diltiazem (CARDIZEM) 30 MG tablet Take 1 tablet (30 mg total) by mouth 2 (two) times daily. 180 tablet 3  . finasteride (PROSCAR) 5 MG tablet Take 1 tablet (5 mg total) by mouth daily. 90 tablet 3  . furosemide (LASIX) 40 MG tablet Take 1 tablet (40 mg total) by mouth daily. 90 tablet 3  . linaclotide (LINZESS) 290 MCG CAPS capsule Take 1 capsule (290 mcg total) by mouth daily before breakfast. 90 capsule 3  . polyethylene glycol (MIRALAX / GLYCOLAX) packet Take 17 g by mouth at bedtime.    . pravastatin (PRAVACHOL) 20 MG tablet TAKE 1 TABLET EACH DAY. 30 tablet 11  . ranitidine (ZANTAC) 150 MG tablet Take 1 tablet (150 mg total) by mouth 2 (two) times daily as needed for heartburn. 180 tablet 3  . senna (SENOKOT) 8.6 MG TABS tablet Take 1-2 tablets (8.6-17.2 mg total) by mouth at bedtime. 120 each 11  . traMADol (ULTRAM) 50 MG tablet Take 0.5-1 tablets (25-50 mg total) by mouth every 6 (six) hours as needed for severe pain. 20 tablet 0  . VENTOLIN HFA 108 (90 Base) MCG/ACT inhaler Inhale 2 puffs into the lungs every 6 (six) hours as needed for wheezing or shortness of breath.   0  . cephALEXin (KEFLEX) 500 MG capsule Take 1 capsule (500 mg total) by mouth 4 (four) times daily. (Patient not  taking: Reported on 09/30/2018) 20 capsule 0   No facility-administered medications prior to visit.     ROS: Review of Systems  Constitutional: Negative for appetite change, fatigue and unexpected weight change.  HENT: Negative for congestion, nosebleeds, sneezing, sore throat and trouble swallowing.   Eyes: Negative for itching and visual disturbance.  Respiratory: Negative for cough.   Cardiovascular: Negative for chest pain, palpitations and leg swelling.  Gastrointestinal: Negative for abdominal distention, blood in stool, diarrhea and nausea.  Genitourinary: Negative for frequency and hematuria.  Musculoskeletal: Positive for arthralgias and gait problem. Negative for back pain, joint swelling and neck pain.  Skin: Negative for rash.  Neurological: Negative for dizziness, tremors, speech difficulty and weakness.  Hematological: Bruises/bleeds easily.  Psychiatric/Behavioral: Negative for agitation, dysphoric mood and sleep disturbance. The patient is not nervous/anxious.     Objective:  BP 128/76 (BP Location: Left Arm, Patient Position: Sitting, Cuff Size: Normal)   Pulse 62   Temp 98 F (36.7 C) (Oral)   Ht 5' 7.5" (1.715 m)   Wt 176 lb (79.8 kg)   SpO2 99%   BMI 27.16 kg/m   BP Readings from Last 3 Encounters:  09/30/18 128/76  09/19/18 123/86  09/15/18 128/74    Wt Readings from Last 3 Encounters:  09/30/18 176  lb (79.8 kg)  09/15/18 173 lb (78.5 kg)  08/27/18 174 lb (78.9 kg)    Physical Exam  Constitutional: He is oriented to person, place, and time. He appears well-developed. No distress.  NAD  HENT:  Mouth/Throat: Oropharynx is clear and moist.  Eyes: Pupils are equal, round, and reactive to light. Conjunctivae are normal.  Neck: Normal range of motion. No JVD present. No thyromegaly present.  Cardiovascular: Normal rate and intact distal pulses. Exam reveals no gallop and no friction rub.  Murmur heard. Pulmonary/Chest: Effort normal and breath  sounds normal. No respiratory distress. He has no wheezes. He has no rales. He exhibits no tenderness.  Abdominal: Soft. Bowel sounds are normal. He exhibits no distension and no mass. There is no tenderness. There is no rebound and no guarding.  Musculoskeletal: Normal range of motion. He exhibits edema and tenderness.  Lymphadenopathy:    He has no cervical adenopathy.  Neurological: He is alert and oriented to person, place, and time. He has normal reflexes. No cranial nerve deficit. He exhibits normal muscle tone. He displays a negative Romberg sign. Coordination abnormal. Gait normal.  Skin: Skin is warm and dry. No rash noted.  Psychiatric: He has a normal mood and affect. His behavior is normal. Judgment and thought content normal.  R eye ecchymosis 1+ ankle edema B   Lab Results  Component Value Date   WBC 6.2 06/17/2018   HGB 15.8 06/17/2018   HCT 47.1 06/17/2018   PLT 114 (L) 06/17/2018   GLUCOSE 101 (H) 07/13/2018   CHOL 112 03/30/2018   TRIG 80 03/30/2018   HDL 39 (L) 03/30/2018   LDLCALC 57 03/30/2018   ALT 14 (L) 04/04/2018   AST 14 (L) 04/04/2018   NA 145 (H) 07/13/2018   K 4.1 07/13/2018   CL 105 07/13/2018   CREATININE 1.77 (H) 07/13/2018   BUN 28 07/13/2018   CO2 23 07/13/2018   TSH 1.681 03/30/2018   PSA 0.24 03/08/2014   INR 1.32 03/30/2018   HGBA1C 5.7 (H) 03/31/2018    Ct Head Wo Contrast  Result Date: 09/19/2018 CLINICAL DATA:  Fall yesterday with facial contusion and blurry vision EXAM: CT HEAD WITHOUT CONTRAST CT MAXILLOFACIAL WITHOUT CONTRAST CT CERVICAL SPINE WITHOUT CONTRAST TECHNIQUE: Multidetector CT imaging of the head, cervical spine, and maxillofacial structures were performed using the standard protocol without intravenous contrast. Multiplanar CT image reconstructions of the cervical spine and maxillofacial structures were also generated. COMPARISON:  04/06/2017 FINDINGS: CT HEAD FINDINGS Brain: Diffuse atrophic and mild chronic white matter  ischemic changes are identified. No findings to suggest acute hemorrhage, acute infarction or space-occupying mass lesion are noted. Vascular: No hyperdense vessel or unexpected calcification. Skull: Normal. Negative for fracture or focal lesion. Other: None. CT MAXILLOFACIAL FINDINGS Osseous: No acute fracture is identified. Orbits: Orbits and their contents show no acute abnormality. Sinuses: Paranasal sinuses are within normal limits. No mucosal abnormality is seen. Soft tissues: There is soft tissue swelling in the right forehead region extending to the supraorbital ridge. No other focal soft tissue abnormality is noted. CT CERVICAL SPINE FINDINGS Alignment: Mild loss of the normal cervical lordosis is noted. Skull base and vertebrae: 7 cervical segments are well visualized. Vertebral body height is well maintained. Multilevel facet hypertrophic changes and osteophytic changes are seen. No significant antero or retrolisthesis is noted. No acute fracture or acute facet abnormality is noted. Soft tissues and spinal canal: No acute soft tissue abnormality is noted. Upper chest: Bilateral large pleural  effusions are noted. No focal infiltrate is seen. Other: None IMPRESSION: CT of the head: Mild atrophic and chronic white matter ischemic changes without acute abnormality. CT of the maxillofacial bones: No acute bony abnormality. Soft tissue swelling in the right forehead region is noted. CT of the cervical spine: Multilevel degenerative change. No acute fracture is seen. Large bilateral pleural effusions. Electronically Signed   By: [name] M.D.   On: 09/19/2018 14:54   Ct Cervical Spine Wo Contrast  Result Date: 09/19/2018 CLINICAL DATA:  Fall yesterday with facial contusion and blurry vision EXAM: CT HEAD WITHOUT CONTRAST CT MAXILLOFACIAL WITHOUT CONTRAST CT CERVICAL SPINE WITHOUT CONTRAST TECHNIQUE: Multidetector CT imaging of the head, cervical spine, and maxillofacial structures were performed using  the standard protocol without intravenous contrast. Multiplanar CT image reconstructions of the cervical spine and maxillofacial structures were also generated. COMPARISON:  04/06/2017 FINDINGS: CT HEAD FINDINGS Brain: Diffuse atrophic and mild chronic white matter ischemic changes are identified. No findings to suggest acute hemorrhage, acute infarction or space-occupying mass lesion are noted. Vascular: No hyperdense vessel or unexpected calcification. Skull: Normal. Negative for fracture or focal lesion. Other: None. CT MAXILLOFACIAL FINDINGS Osseous: No acute fracture is identified. Orbits: Orbits and their contents show no acute abnormality. Sinuses: Paranasal sinuses are within normal limits. No mucosal abnormality is seen. Soft tissues: There is soft tissue swelling in the right forehead region extending to the supraorbital ridge. No other focal soft tissue abnormality is noted. CT CERVICAL SPINE FINDINGS Alignment: Mild loss of the normal cervical lordosis is noted. Skull base and vertebrae: 7 cervical segments are well visualized. Vertebral body height is well maintained. Multilevel facet hypertrophic changes and osteophytic changes are seen. No significant antero or retrolisthesis is noted. No acute fracture or acute facet abnormality is noted. Soft tissues and spinal canal: No acute soft tissue abnormality is noted. Upper chest: Bilateral large pleural effusions are noted. No focal infiltrate is seen. Other: None IMPRESSION: CT of the head: Mild atrophic and chronic white matter ischemic changes without acute abnormality. CT of the maxillofacial bones: No acute bony abnormality. Soft tissue swelling in the right forehead region is noted. CT of the cervical spine: Multilevel degenerative change. No acute fracture is seen. Large bilateral pleural effusions. Electronically Signed   By: [name] M.D.   On: 09/19/2018 14:54   Dg Hand Complete Right  Result Date: 09/19/2018 CLINICAL DATA:  Recent fall  with hand pain, initial encounter EXAM: RIGHT HAND - COMPLETE 3+ VIEW COMPARISON:  None. FINDINGS: Mild degenerative changes of the interphalangeal joints are seen. Degenerative change of the first [facility] joint is noted as well. Radiocarpal degenerative changes are seen. No acute fracture or dislocation is noted. No radiopaque foreign body is seen. IMPRESSION: Chronic degenerative change without acute abnormality. Electronically Signed   By: [name] M.D.   On: 09/19/2018 14:06   Ct Maxillofacial Wo Cm  Result Date: 09/19/2018 CLINICAL DATA:  Fall yesterday with facial contusion and blurry vision EXAM: CT HEAD WITHOUT CONTRAST CT MAXILLOFACIAL WITHOUT CONTRAST CT CERVICAL SPINE WITHOUT CONTRAST TECHNIQUE: Multidetector CT imaging of the head, cervical spine, and maxillofacial structures were performed using the standard protocol without intravenous contrast. Multiplanar CT image reconstructions of the cervical spine and maxillofacial structures were also generated. COMPARISON:  04/06/2017 FINDINGS: CT HEAD FINDINGS Brain: Diffuse atrophic and mild chronic white matter ischemic changes are identified. No findings to suggest acute hemorrhage, acute infarction or space-occupying mass lesion are noted. Vascular: No hyperdense vessel or  unexpected calcification. Skull: Normal. Negative for fracture or focal lesion. Other: None. CT MAXILLOFACIAL FINDINGS Osseous: No acute fracture is identified. Orbits: Orbits and their contents show no acute abnormality. Sinuses: Paranasal sinuses are within normal limits. No mucosal abnormality is seen. Soft tissues: There is soft tissue swelling in the right forehead region extending to the supraorbital ridge. No other focal soft tissue abnormality is noted. CT CERVICAL SPINE FINDINGS Alignment: Mild loss of the normal cervical lordosis is noted. Skull base and vertebrae: 7 cervical segments are well visualized. Vertebral body height is well maintained. Multilevel facet  hypertrophic changes and osteophytic changes are seen. No significant antero or retrolisthesis is noted. No acute fracture or acute facet abnormality is noted. Soft tissues and spinal canal: No acute soft tissue abnormality is noted. Upper chest: Bilateral large pleural effusions are noted. No focal infiltrate is seen. Other: None IMPRESSION: CT of the head: Mild atrophic and chronic white matter ischemic changes without acute abnormality. CT of the maxillofacial bones: No acute bony abnormality. Soft tissue swelling in the right forehead region is noted. CT of the cervical spine: Multilevel degenerative change. No acute fracture is seen. Large bilateral pleural effusions. Electronically Signed   By: [name] M.D.   On: 09/19/2018 14:54    Assessment & Plan:   There are no diagnoses linked to this encounter.   No orders of the defined types were placed in this encounter.    Follow-up: No follow-ups on file.  Walker Kehr, MD

## 2018-09-30 NOTE — Assessment & Plan Note (Signed)
CT/X rays - no fx. Cerv CT - "Upper chest: Bilateral large pleural effusions are noted. No focal infiltrate is seen."

## 2018-09-30 NOTE — Patient Instructions (Signed)
Stop Furosemide Start Torsemide 

## 2018-09-30 NOTE — Assessment & Plan Note (Signed)
ER f/u - fell on concrete on 09/19/18 - head contusion, R hand laceration Discussed

## 2018-09-30 NOTE — Assessment & Plan Note (Signed)
CT/X rays - no fx. Cerv CT - "Upper chest: Bilateral large pleural effusions are noted. No focal infiltrate is seen." Consider Entresto in 2 wks

## 2018-09-30 NOTE — Telephone Encounter (Signed)
Pt c/o medication issue:  1. Name of Medication:  torsemide (DEMADEX) 10 MG tablet  2. How are you currently taking this medication (dosage and times per day)?   3. Are you having a reaction (difficulty breathing--STAT)? no  4. What is your medication issue?  PCP placed him on torsemide (DEMADEX) 10 MG tablet and took him off of the furosemide (LASIX). He wants to make sure that Dr. Percival Spanish is in agreement with this.

## 2018-10-01 NOTE — Telephone Encounter (Signed)
Returned call to patient Dr.Hochrein advised ok to stop Lasix and take Torsemide 10 mg daily.

## 2018-10-01 NOTE — Telephone Encounter (Signed)
OK 

## 2018-10-14 ENCOUNTER — Encounter (HOSPITAL_COMMUNITY): Payer: Self-pay

## 2018-10-14 ENCOUNTER — Observation Stay (HOSPITAL_COMMUNITY)
Admission: EM | Admit: 2018-10-14 | Discharge: 2018-10-17 | Disposition: A | Discharge status: Home / Self Care | Payer: Medicare Other | Attending: Family Medicine | Admitting: Family Medicine

## 2018-10-14 ENCOUNTER — Emergency Department (HOSPITAL_COMMUNITY): Payer: Medicare Other

## 2018-10-14 ENCOUNTER — Other Ambulatory Visit: Payer: Self-pay

## 2018-10-14 ENCOUNTER — Observation Stay (HOSPITAL_COMMUNITY): Payer: Medicare Other

## 2018-10-14 ENCOUNTER — Ambulatory Visit: Payer: Medicare Other | Admitting: Internal Medicine

## 2018-10-14 DIAGNOSIS — Z9049 Acquired absence of other specified parts of digestive tract: Secondary | ICD-10-CM | POA: Diagnosis not present

## 2018-10-14 DIAGNOSIS — I482 Chronic atrial fibrillation, unspecified: Secondary | ICD-10-CM | POA: Diagnosis present

## 2018-10-14 DIAGNOSIS — K5900 Constipation, unspecified: Secondary | ICD-10-CM | POA: Diagnosis not present

## 2018-10-14 DIAGNOSIS — R109 Unspecified abdominal pain: Secondary | ICD-10-CM

## 2018-10-14 DIAGNOSIS — N183 Chronic kidney disease, stage 3 unspecified: Secondary | ICD-10-CM | POA: Diagnosis present

## 2018-10-14 DIAGNOSIS — I2510 Atherosclerotic heart disease of native coronary artery without angina pectoris: Secondary | ICD-10-CM | POA: Insufficient documentation

## 2018-10-14 DIAGNOSIS — F329 Major depressive disorder, single episode, unspecified: Secondary | ICD-10-CM | POA: Diagnosis not present

## 2018-10-14 DIAGNOSIS — F419 Anxiety disorder, unspecified: Secondary | ICD-10-CM | POA: Diagnosis not present

## 2018-10-14 DIAGNOSIS — N10 Acute tubulo-interstitial nephritis: Secondary | ICD-10-CM | POA: Diagnosis not present

## 2018-10-14 DIAGNOSIS — J90 Pleural effusion, not elsewhere classified: Secondary | ICD-10-CM | POA: Diagnosis not present

## 2018-10-14 DIAGNOSIS — N390 Urinary tract infection, site not specified: Principal | ICD-10-CM | POA: Insufficient documentation

## 2018-10-14 DIAGNOSIS — I5042 Chronic combined systolic (congestive) and diastolic (congestive) heart failure: Secondary | ICD-10-CM | POA: Diagnosis present

## 2018-10-14 DIAGNOSIS — I700 Atherosclerosis of aorta: Secondary | ICD-10-CM | POA: Insufficient documentation

## 2018-10-14 DIAGNOSIS — S3991XA Unspecified injury of abdomen, initial encounter: Secondary | ICD-10-CM | POA: Diagnosis not present

## 2018-10-14 DIAGNOSIS — E785 Hyperlipidemia, unspecified: Secondary | ICD-10-CM | POA: Diagnosis not present

## 2018-10-14 DIAGNOSIS — B964 Proteus (mirabilis) (morganii) as the cause of diseases classified elsewhere: Secondary | ICD-10-CM | POA: Insufficient documentation

## 2018-10-14 DIAGNOSIS — Z79899 Other long term (current) drug therapy: Secondary | ICD-10-CM | POA: Insufficient documentation

## 2018-10-14 DIAGNOSIS — Z7901 Long term (current) use of anticoagulants: Secondary | ICD-10-CM | POA: Diagnosis not present

## 2018-10-14 DIAGNOSIS — Z888 Allergy status to other drugs, medicaments and biological substances status: Secondary | ICD-10-CM | POA: Insufficient documentation

## 2018-10-14 DIAGNOSIS — I130 Hypertensive heart and chronic kidney disease with heart failure and stage 1 through stage 4 chronic kidney disease, or unspecified chronic kidney disease: Secondary | ICD-10-CM | POA: Diagnosis not present

## 2018-10-14 DIAGNOSIS — R2689 Other abnormalities of gait and mobility: Secondary | ICD-10-CM | POA: Insufficient documentation

## 2018-10-14 DIAGNOSIS — R1032 Left lower quadrant pain: Secondary | ICD-10-CM | POA: Diagnosis not present

## 2018-10-14 DIAGNOSIS — Z0289 Encounter for other administrative examinations: Secondary | ICD-10-CM

## 2018-10-14 DIAGNOSIS — N12 Tubulo-interstitial nephritis, not specified as acute or chronic: Secondary | ICD-10-CM | POA: Diagnosis not present

## 2018-10-14 DIAGNOSIS — R079 Chest pain, unspecified: Secondary | ICD-10-CM | POA: Diagnosis not present

## 2018-10-14 DIAGNOSIS — Z66 Do not resuscitate: Secondary | ICD-10-CM | POA: Insufficient documentation

## 2018-10-14 DIAGNOSIS — I420 Dilated cardiomyopathy: Secondary | ICD-10-CM | POA: Diagnosis present

## 2018-10-14 DIAGNOSIS — S299XXA Unspecified injury of thorax, initial encounter: Secondary | ICD-10-CM | POA: Diagnosis not present

## 2018-10-14 DIAGNOSIS — N400 Enlarged prostate without lower urinary tract symptoms: Secondary | ICD-10-CM | POA: Diagnosis not present

## 2018-10-14 LAB — I-STAT CG4 LACTIC ACID, ED: Lactic Acid, Venous: 1.62 mmol/L (ref 0.5–1.9)

## 2018-10-14 LAB — CBC
HCT: 51.6 % (ref 39.0–52.0)
HEMOGLOBIN: 16.1 g/dL (ref 13.0–17.0)
MCH: 31.9 pg (ref 26.0–34.0)
MCHC: 31.2 g/dL (ref 30.0–36.0)
MCV: 102.2 fL — ABNORMAL HIGH (ref 80.0–100.0)
Platelets: 143 10*3/uL — ABNORMAL LOW (ref 150–400)
RBC: 5.05 MIL/uL (ref 4.22–5.81)
RDW: 15 % (ref 11.5–15.5)
WBC: 6.4 10*3/uL (ref 4.0–10.5)
nRBC: 0 % (ref 0.0–0.2)

## 2018-10-14 LAB — URINALYSIS, ROUTINE W REFLEX MICROSCOPIC
Bacteria, UA: NONE SEEN
Bilirubin Urine: NEGATIVE
Glucose, UA: NEGATIVE mg/dL
Ketones, ur: NEGATIVE mg/dL
Nitrite: NEGATIVE
PH: 6 (ref 5.0–8.0)
Protein, ur: 100 mg/dL — AB
RBC / HPF: >50 RBC/hpf — ABNORMAL HIGH (ref 0–5)
SPECIFIC GRAVITY, URINE: 1.013 (ref 1.005–1.030)
WBC, UA: >50 WBC/hpf — ABNORMAL HIGH (ref 0–5)

## 2018-10-14 LAB — COMPREHENSIVE METABOLIC PANEL
ALBUMIN: 3.9 g/dL (ref 3.5–5.0)
ALK PHOS: 77 U/L (ref 38–126)
ALT: 13 U/L (ref 0–44)
AST: 25 U/L (ref 15–41)
Anion gap: 6 (ref 5–15)
BUN: 34 mg/dL — AB (ref 8–23)
CALCIUM: 8.8 mg/dL — AB (ref 8.9–10.3)
CO2: 31 mmol/L (ref 22–32)
CREATININE: 1.66 mg/dL — AB (ref 0.61–1.24)
Chloride: 103 mmol/L (ref 98–111)
GFR calc Af Amer: 40 mL/min — ABNORMAL LOW (ref >60)
GFR calc non Af Amer: 34 mL/min — ABNORMAL LOW (ref >60)
GLUCOSE: 99 mg/dL (ref 70–99)
Potassium: 4.4 mmol/L (ref 3.5–5.1)
Sodium: 140 mmol/L (ref 135–145)
TOTAL PROTEIN: 7.3 g/dL (ref 6.5–8.1)
Total Bilirubin: 1.5 mg/dL — ABNORMAL HIGH (ref 0.3–1.2)

## 2018-10-14 LAB — LIPASE, BLOOD: Lipase: 32 U/L (ref 11–51)

## 2018-10-14 MED ORDER — DILTIAZEM HCL 30 MG PO TABS
30.0000 mg | ORAL_TABLET | Freq: Once | ORAL | Status: AC
  Administered 2018-10-14: 30 mg via ORAL
  Filled 2018-10-14: qty 1

## 2018-10-14 MED ORDER — DILTIAZEM HCL 30 MG PO TABS
30.0000 mg | ORAL_TABLET | Freq: Two times a day (BID) | ORAL | Status: DC
  Administered 2018-10-14 – 2018-10-17 (×6): 30 mg via ORAL
  Filled 2018-10-14 (×7): qty 1

## 2018-10-14 MED ORDER — TECHNETIUM TC 99M DIETHYLENETRIAME-PENTAACETIC ACID
29.0000 | Freq: Once | INTRAVENOUS | Status: AC
  Administered 2018-10-14: 29 via RESPIRATORY_TRACT

## 2018-10-14 MED ORDER — AMIODARONE HCL 200 MG PO TABS
200.0000 mg | ORAL_TABLET | Freq: Every day | ORAL | Status: DC
  Administered 2018-10-15 – 2018-10-17 (×3): 200 mg via ORAL
  Filled 2018-10-14 (×3): qty 1

## 2018-10-14 MED ORDER — LINACLOTIDE 145 MCG PO CAPS
290.0000 ug | ORAL_CAPSULE | Freq: Every day | ORAL | Status: DC
  Administered 2018-10-15 – 2018-10-17 (×3): 290 ug via ORAL
  Filled 2018-10-14 (×3): qty 2

## 2018-10-14 MED ORDER — POLYETHYLENE GLYCOL 3350 17 G PO PACK
17.0000 g | PACK | Freq: Every day | ORAL | Status: DC
  Administered 2018-10-14 – 2018-10-15 (×2): 17 g via ORAL
  Filled 2018-10-14 (×2): qty 1

## 2018-10-14 MED ORDER — ALBUTEROL SULFATE (2.5 MG/3ML) 0.083% IN NEBU: 2.5000 mg | INHALATION_SOLUTION | Freq: Four times a day (QID) | RESPIRATORY_TRACT | Status: DC | PRN

## 2018-10-14 MED ORDER — TORSEMIDE 10 MG PO TABS
10.0000 mg | ORAL_TABLET | Freq: Every day | ORAL | Status: DC
  Administered 2018-10-15 – 2018-10-17 (×3): 10 mg via ORAL
  Filled 2018-10-14 (×3): qty 1

## 2018-10-14 MED ORDER — TECHNETIUM TO 99M ALBUMIN AGGREGATED
4.1000 | Freq: Once | INTRAVENOUS | Status: AC
  Administered 2018-10-14: 4.1 via INTRAVENOUS

## 2018-10-14 MED ORDER — TRAMADOL HCL 50 MG PO TABS
50.0000 mg | ORAL_TABLET | Freq: Two times a day (BID) | ORAL | Status: DC | PRN
  Administered 2018-10-15 – 2018-10-17 (×3): 50 mg via ORAL
  Filled 2018-10-14 (×3): qty 1

## 2018-10-14 MED ORDER — ALPRAZOLAM 1 MG PO TABS
1.0000 mg | ORAL_TABLET | Freq: Every day | ORAL | Status: DC
  Administered 2018-10-14 – 2018-10-16 (×3): 1 mg via ORAL
  Filled 2018-10-14 (×3): qty 1

## 2018-10-14 MED ORDER — SODIUM CHLORIDE 0.9 % IV SOLN
1.0000 g | INTRAVENOUS | Status: DC
  Administered 2018-10-15 – 2018-10-16 (×2): 1 g via INTRAVENOUS
  Filled 2018-10-14: qty 10
  Filled 2018-10-14 (×2): qty 1

## 2018-10-14 MED ORDER — SODIUM CHLORIDE 0.9 % IV SOLN
1.0000 g | Freq: Once | INTRAVENOUS | Status: AC
  Administered 2018-10-14: 1 g via INTRAVENOUS
  Filled 2018-10-14: qty 10

## 2018-10-14 MED ORDER — SENNA 8.6 MG PO TABS
1.0000 | ORAL_TABLET | Freq: Every evening | ORAL | Status: DC | PRN
  Administered 2018-10-15: 8.6 mg via ORAL
  Administered 2018-10-16: 17.2 mg via ORAL
  Filled 2018-10-14: qty 1
  Filled 2018-10-14: qty 2

## 2018-10-14 MED ORDER — ALBUTEROL SULFATE HFA 108 (90 BASE) MCG/ACT IN AERS: 2.0000 | INHALATION_SPRAY | Freq: Four times a day (QID) | RESPIRATORY_TRACT | Status: DC | PRN

## 2018-10-14 MED ORDER — APIXABAN 2.5 MG PO TABS
2.5000 mg | ORAL_TABLET | Freq: Two times a day (BID) | ORAL | Status: DC
  Administered 2018-10-14 – 2018-10-17 (×6): 2.5 mg via ORAL
  Filled 2018-10-14 (×6): qty 1

## 2018-10-14 MED ORDER — ONDANSETRON HCL 4 MG/2ML IJ SOLN: 4.0000 mg | Freq: Four times a day (QID) | INTRAMUSCULAR | Status: DC | PRN

## 2018-10-14 MED ORDER — ACETAMINOPHEN 325 MG PO TABS: 650.0000 mg | ORAL_TABLET | Freq: Four times a day (QID) | ORAL | Status: DC | PRN

## 2018-10-14 MED ORDER — FINASTERIDE 5 MG PO TABS
5.0000 mg | ORAL_TABLET | Freq: Every day | ORAL | Status: DC
  Administered 2018-10-15 – 2018-10-17 (×3): 5 mg via ORAL
  Filled 2018-10-14 (×3): qty 1

## 2018-10-14 MED ORDER — ACETAMINOPHEN 650 MG RE SUPP: 650.0000 mg | Freq: Four times a day (QID) | RECTAL | Status: DC | PRN

## 2018-10-14 MED ORDER — PRAVASTATIN SODIUM 20 MG PO TABS
20.0000 mg | ORAL_TABLET | Freq: Every day | ORAL | Status: DC
  Administered 2018-10-15 – 2018-10-17 (×3): 20 mg via ORAL
  Filled 2018-10-14 (×3): qty 1

## 2018-10-14 MED ORDER — ONDANSETRON HCL 4 MG PO TABS: 4.0000 mg | ORAL_TABLET | Freq: Four times a day (QID) | ORAL | Status: DC | PRN

## 2018-10-14 MED ORDER — ACETAMINOPHEN 500 MG PO TABS
1000.0000 mg | ORAL_TABLET | Freq: Once | ORAL | Status: AC
  Administered 2018-10-14: 1000 mg via ORAL
  Filled 2018-10-14: qty 2
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
## 2018-10-14 NOTE — ED Notes (Signed)
Bed: WA05 Expected date:  Expected time:  Means of arrival:  Comments: 

## 2018-10-14 NOTE — ED Notes (Signed)
Patient transported to CT 

## 2018-10-14 NOTE — ED Triage Notes (Signed)
Transported by [facility] from home-- LLQ abdominal pain that started 2 days ago. +n/d. Experienced a fall a few weeks ago and has bruising beneath right eye. AAO x 4. Pain currently 0/10.

## 2018-10-14 NOTE — ED Notes (Signed)
ED TO INPATIENT HANDOFF REPORT  Name/Age/Gender [name] [name] 84 y.o. male  Code Status Code Status History    Date Active Date Inactive Code Status Order ID Comments User Context   06/15/2018 0805 06/18/2018 1629 DNR [id number]  Debbe Odea, MD ED   03/30/2018 2225 04/04/2018 1913 Full Code [id number]  Bennie Pierini, MD Inpatient   04/06/2017 1842 04/14/2017 1819 Full Code [id number]  Jonetta Osgood, MD Inpatient   08/22/2016 1801 08/23/2016 1802 Full Code [id number]  Louellen Molder, MD Inpatient   08/22/2016 1651 08/22/2016 1801 Full Code [id number]  Louellen Molder, MD ED   06/03/2016 2019 06/05/2016 1823 Full Code [id number]  Schorr, Rhetta Mura, NP Inpatient   05/28/2016 1514 06/03/2016 2019 DNR [id number]  Samella Parr, NP Inpatient    Questions for Most Recent Historical Code Status (Order [id number])    Question Answer Comment   In the event of cardiac or respiratory ARREST Do not call a "code blue"    In the event of cardiac or respiratory ARREST Do not perform Intubation, CPR, defibrillation or ACLS    In the event of cardiac or respiratory ARREST Use medication by any route, position, wound care, and other measures to relive pain and suffering. May use oxygen, suction and manual treatment of airway obstruction as needed for comfort.         Advance Directive Documentation     Most Recent Value  Type of Advance Directive  Healthcare Power of Attorney  Pre-existing out of facility DNR order (yellow form or pink MOST form)  -  "MOST" Form in Place?  -      Home/SNF/Other Home  Chief Complaint Abd/Side Pain  Level of Care/Admitting Diagnosis ED Disposition    ED Disposition Condition [place]: [facility] [100102]  Level of Care: Telemetry [5]  Admit to tele based on following criteria: Other see comments  Comments: atrial fibrillation  Diagnosis: Acute pyelonephritis [390300]  Admitting Physician: Desiree Hane [[id number]]   Attending Physician: Desiree Hane [id number]  PT Class (Do Not Modify): Observation [104]  PT Acc Code (Do Not Modify): Observation [10022]       Medical History Past Medical History:  Diagnosis Date  . Anxiety   . Arthritis    "shoulders" (05/28/2016)  . Cardiomyopathy   . Coronary artery disease   . Depression    hx (05/28/2016)  . Hemorrhoids   . Hyperlipidemia   . Hypertension   . Paroxysmal atrial fibrillation (HCC)   . Skipped heart beats     Allergies Allergies  Allergen Reactions  . Lisinopril Cough    IV Location/Drains/Wounds Patient Lines/Drains/Airways Status   Active Line/Drains/Airways    Name:   Placement date:   Placement time:   Site:   Days:   Peripheral IV 10/14/18 Left Antecubital   10/14/18    1028    Antecubital   less than 1   External Urinary Catheter   04/12/17    1100    -   550   External Urinary Catheter   04/02/18    0815    -   195   Wound 10/13/12 Abrasion(s) Face Left bleeding controlled, red   10/13/12    1147    Face   2192   Wound 10/13/12 Skin tear Wrist Left bleeding controlled, red, purple, black   10/13/12    1148    Wrist   2192   Wound 10/13/12 Abrasion(s) Knee  Left bleeding controlled, red   10/13/12    1149    Knee   2192   Wound / Incision (Open or Dehisced) 05/28/16 Laceration Head Upper;Mid jagged   05/28/16    1130    Head   869   Wound / Incision (Open or Dehisced) 05/30/16 Laceration Arm Right   05/30/16    2115    Arm   867   Wound / Incision (Open or Dehisced) 08/22/16 Laceration Head Posterior Old blood with 2 staples from ED   08/22/16    1759    Head   783          Labs/Imaging Results for orders placed or performed during the hospital encounter of 10/14/18 (from the past 48 hour(s))  Lipase, blood     Status: None   Collection Time: 10/14/18 10:31 AM  Result Value Ref Range   Lipase 32 11 - 51 U/L    Comment: Performed at [facility], [place] [address] [place], [place] [address]   Comprehensive metabolic panel     Status: Abnormal   Collection Time: 10/14/18 10:31 AM  Result Value Ref Range   Sodium 140 135 - 145 mmol/L   Potassium 4.4 3.5 - 5.1 mmol/L   Chloride 103 98 - 111 mmol/L   CO2 31 22 - 32 mmol/L   Glucose, Bld 99 70 - 99 mg/dL   BUN 34 (H) 8 - 23 mg/dL   Creatinine, Ser 1.66 (H) 0.61 - 1.24 mg/dL   Calcium 8.8 (L) 8.9 - 10.3 mg/dL   Total Protein 7.3 6.5 - 8.1 g/dL   Albumin 3.9 3.5 - 5.0 g/dL   AST 25 15 - 41 U/L   ALT 13 0 - 44 U/L   Alkaline Phosphatase 77 38 - 126 U/L   Total Bilirubin 1.5 (H) 0.3 - 1.2 mg/dL   GFR calc non Af Amer 34 (L) >60 mL/min   GFR calc Af Amer 40 (L) >60 mL/min   Anion gap 6 5 - 15    Comment: Performed at [facility], [place] [address] [place], [place] [address]  CBC     Status: Abnormal   Collection Time: 10/14/18 10:31 AM  Result Value Ref Range   WBC 6.4 4.0 - 10.5 K/uL   RBC 5.05 4.22 - 5.81 MIL/uL   Hemoglobin 16.1 13.0 - 17.0 g/dL   HCT 51.6 39.0 - 52.0 %   MCV 102.2 (H) 80.0 - 100.0 fL   MCH 31.9 26.0 - 34.0 pg   MCHC 31.2 30.0 - 36.0 g/dL   RDW 15.0 11.5 - 15.5 %   Platelets 143 (L) 150 - 400 K/uL   nRBC 0.0 0.0 - 0.2 %    Comment: Performed at [facility], [place] [address], [place], [place] [address]  Urinalysis, Routine w reflex microscopic     Status: Abnormal   Collection Time: 10/14/18 11:12 AM  Result Value Ref Range   Color, Urine AMBER (A) YELLOW    Comment: BIOCHEMICALS MAY BE AFFECTED BY COLOR   APPearance CLOUDY (A) CLEAR   Specific Gravity, Urine 1.013 1.005 - 1.030   pH 6.0 5.0 - 8.0   Glucose, UA NEGATIVE NEGATIVE mg/dL   Hgb urine dipstick MODERATE (A) NEGATIVE   Bilirubin Urine NEGATIVE NEGATIVE   Ketones, ur NEGATIVE NEGATIVE mg/dL   Protein, ur 100 (A) NEGATIVE mg/dL   Nitrite NEGATIVE NEGATIVE   Leukocytes, UA LARGE (A) NEGATIVE   RBC /  HPF >50 (H) 0 - 5 RBC/hpf   WBC, UA >50 (H) 0 - 5 WBC/hpf   Bacteria, UA NONE SEEN NONE SEEN   Mucus  PRESENT     Comment: Performed at [facility], [place] [address] [place], [place] [address]  I-Stat CG4 Lactic Acid, ED     Status: None   Collection Time: 10/14/18  1:53 PM  Result Value Ref Range   Lactic Acid, Venous 1.62 0.5 - 1.9 mmol/L   Dg Chest 2 View  Result Date: 10/14/2018 CLINICAL DATA:  Left lower chest pain since a fall 2 weeks ago. EXAM: CHEST - 2 VIEW COMPARISON:  Chest x-rays dated 06/15/2018 and 03/30/2018 and 03/11/2018 FINDINGS: There are small to moderate bilateral pleural effusions which are either chronic or recurrent but slightly increased on the left since the most recent chest x-ray. Heart size and vascularity are normal. Aortic atherosclerosis. No acute bony abnormality. Severe arthritic changes of both shoulders. No visible rib fractures IMPRESSION: 1. Small to moderate bilateral pleural effusions, chronic but increased on the left. 2.  Aortic Atherosclerosis (ICD10-I70.0). 3. No visible acute rib fractures on this chest x-ray. Electronically Signed   By: Lorriane Shire M.D.   On: 10/14/2018 12:00   Ct Renal Stone Study  Result Date: 10/14/2018 CLINICAL DATA:  Left lower quadrant pain for 2 days, history of recent fall, initial encounter EXAM: CT ABDOMEN AND PELVIS WITHOUT CONTRAST TECHNIQUE: Multidetector CT imaging of the abdomen and pelvis was performed following the standard protocol without IV contrast. COMPARISON:  06/15/2018 FINDINGS: Lower chest: Lung bases demonstrate bilateral pleural effusions. Associated consolidation is noted in the lower lobes. Calcified granuloma is noted in the right lower lobe. Additionally there is focal hypodensity identified within the left lower lobe pulmonary arterial branch. No contrast was administered for this exam although this raises suspicion for possible pulmonary embolism. Coronary calcifications are noted as well. Hepatobiliary: No focal liver abnormality is seen. Status post cholecystectomy. No biliary  dilatation. Pancreas: Unremarkable. No pancreatic ductal dilatation or surrounding inflammatory changes. Spleen: Normal in size without focal abnormality. Adrenals/Urinary Tract: Adrenal glands are within normal limits bilaterally. Kidneys are well visualized bilaterally with multiple cysts stable from the previous exam. Chronic left UPJ obstructive change is again noted and stable. Nonobstructing renal calculi are noted on the left new from the prior study. No ureteral stones are noted. The bladder is decompressed. Stomach/Bowel: Mild diverticular changes noted without evidence of diverticulitis. The appendix is well visualized extending into a right inguinal hernia. No obstructive changes are seen. The small bowel and stomach appear within normal limits. Vascular/Lymphatic: Aortic atherosclerosis. No enlarged abdominal or pelvic lymph nodes. Reproductive: Prostate is unremarkable. Other: Fat containing inguinal hernias are noted bilaterally. The right inguinal hernia again demonstrates a portion of the cecum and appendix within. No a car serration is noted. No ascites is seen. Musculoskeletal: Degenerative change without acute abnormality. IMPRESSION: Hypodensity within the left lower lobe pulmonary arterial branches suspicious for pulmonary embolism although no contrast was administered. CTA of the chest may be helpful for further evaluation. Bilateral pleural effusions and lower lobe consolidation/atelectasis. Diverticulosis without diverticulitis. Chronic changes in the kidneys as described with evidence of nonobstructing stones on the left. The largest of these measures approximately 5 mm. Fat containing inguinal hernias bilaterally. The appendix and a portion of the cecum are again noted within the right hernia although no incarceration is seen. Electronically Signed   By: [name] M.D.   On: 10/14/2018 12:24  None  Pending Labs Unresulted Labs (From admission, onward)    Start     Ordered    10/14/18 1321  Urine culture  ONCE - STAT,   STAT     10/14/18 1322   10/14/18 1321  Culture, blood (routine x 2)  BLOOD CULTURE X 2,   STAT     10/14/18 1322   Signed and Held  Basic metabolic panel  Tomorrow morning,   R     Signed and Held   Signed and Held  CBC  Tomorrow morning,   R     Signed and Held          Vitals/Pain Today's Vitals   10/14/18 1400 10/14/18 1415 10/14/18 1430 10/14/18 1455  BP: 112/85  119/75   Pulse: (!) 103 98 83   Resp: 15 (!) 21 (!) 22   Temp:      TempSrc:      SpO2: 98% 97% 96%   PainSc:    0-No pain    Isolation Precautions No active isolations  Medications Medications  acetaminophen (TYLENOL) tablet 1,000 mg (1,000 mg Oral Given 10/14/18 1130)  diltiazem (CARDIZEM) tablet 30 mg (30 mg Oral Given 10/14/18 1159)  cefTRIAXone (ROCEPHIN) 1 g in sodium chloride 0.9 % 100 mL IVPB (1 g Intravenous New Bag/Given 10/14/18 1345)    Mobility walks with device

## 2018-10-14 NOTE — ED Notes (Signed)
Patient transported to X-ray 

## 2018-10-14 NOTE — ED Notes (Signed)
ED Provider at bedside. 

## 2018-10-14 NOTE — H&P (Addendum)
History and Physical  [name] [name] QMV:[id number] DOB: Sep 19, 1920 DOA: 10/14/2018   PCP: Cassandria Anger, MD  Outpatient Specialists: Dr. Jeneen Rinks Hochrein(Cardiology) Patient coming from:Home  Chief Complaint: Abdominal pain  HPI: [name] [name] is a 84 y.o. male with medical history significant for CHF with reduced EF (20%, 03/2018), chronic atrial fibrillation on Eliquis who presents on 10/14/2018 with left-sided abdominal pain x2 days.   Several weeks ago he reports falling onto the concrete floor of his carport after tripping on a limb in his yard.  He was later evaluated at [facility] ED on 11/2 for laceration of his right hand and ecchymosis of his right eye associated with that fall. He was prescribed a short course of Keflex which she has since completed.Marland Kitchen   He now reports for the last two days he has noticed left sided abdominal pain, that was a stabbing sharpness like a knife.  He thought it was because of his prior fall from but he landed on his right side.  Coughing makes the pain worse, no obvious alleviating factors.  Denies any nausea, emesis, fevers or chills.  No recent sick contacts.  No diarrhea.  Regarding his CHF. His PCP changed his lasix to torsemide based off large bilateral pleural effusions noted on CT cervical spine imaging from his recent ED visit(11/2). He denies any worsening lower leg swelling, PND, or orthopnea  No recent travel history, no sick contacts, no family history of blood clots   ED Course: In the ED he had a temp of 97.3,Respiratory rate 22, otherwise him dynamically stable.  Lactic acid was within normal limits.  Lipase is unremarkable.  Blood cultures x2 were obtained. UA significant for large leukocytes, greater than 50 WBC, greater than 50 RBC, moderate hemoglobin, no bacteria seen.  CMP and CBC showed no acute abnormalities  CT renal stone study showed nonobstructive stones in the left side (approximately 5 mm).  Also noted bilateral pleural  effusions, lower lobe consolidation/atelectasis, and left lower lobe pulmonary to arrange hypodensity concerning for potential PE (no contrast was administered so difficult to evaluate) .  [facility] hospitalist was called to admit.  Review of Systems:As mentioned in the history of present illness.Review of systems are otherwise negative Patient seen in the ED.   Past Medical History:  Diagnosis Date  . Anxiety   . Arthritis    "shoulders" (05/28/2016)  . Cardiomyopathy   . Coronary artery disease   . Depression    hx (05/28/2016)  . Hemorrhoids   . Hyperlipidemia   . Hypertension   . Paroxysmal atrial fibrillation (HCC)   . Skipped heart beats    Past Surgical History:  Procedure Laterality Date  . APPENDECTOMY    . CARDIOVERSION N/A 06/04/2016   Procedure: CARDIOVERSION;  Surgeon: Pixie Casino, MD;  Location: [facility] ENDOSCOPY;  Service: Cardiovascular;  Laterality: N/A;  . CARDIOVERSION N/A 04/09/2017   Procedure: CARDIOVERSION;  Surgeon: Thayer Headings, MD;  Location: [facility] ENDOSCOPY;  Service: Cardiovascular;  Laterality: N/A;  . CARDIOVERSION N/A 04/01/2018   Procedure: CARDIOVERSION;  Surgeon: Skeet Latch, MD;  Location: [place];  Service: Cardiovascular;  Laterality: N/A;  . CATARACT EXTRACTION Left   . CHOLECYSTECTOMY OPEN    . COLONOSCOPY W/ BIOPSIES AND POLYPECTOMY    . INGUINAL HERNIA REPAIR Bilateral   . MYRINGOTOMY WITH TUBE PLACEMENT Bilateral   . skin cancer removal Right    side of nose by Rt eye  . TEE WITHOUT CARDIOVERSION N/A 06/04/2016   Procedure: TRANSESOPHAGEAL  ECHOCARDIOGRAM (TEE);  Surgeon: Pixie Casino, MD;  Location: [facility] ENDOSCOPY;  Service: Cardiovascular;  Laterality: N/A;  . TEE WITHOUT CARDIOVERSION N/A 04/01/2018   Procedure: TRANSESOPHAGEAL ECHOCARDIOGRAM (TEE);  Surgeon: Skeet Latch, MD;  Location: [place];  Service: Cardiovascular;  Laterality: N/A;  . TONSILLECTOMY AND ADENOIDECTOMY     Allergies  Allergen Reactions  .  Lisinopril Cough   Social History:  reports that he has never smoked. He has never used smokeless tobacco. He reports that he does not drink alcohol or use drugs. Family History  Problem Relation Age of Onset  . Heart disease Brother       Prior to Admission medications   Medication Sig Start Date End Date Taking? Authorizing Provider  ALPRAZolam Duanne Moron) 1 MG tablet Take 1 tablet (1 mg total) by mouth at bedtime. 08/10/18  Yes Plotnikov, Evie Lacks, MD  amiodarone (PACERONE) 200 MG tablet TAKE 1 TABLET ONCE DAILY. Patient taking differently: Take 200 mg by mouth daily.  08/12/18  Yes Plotnikov, Evie Lacks, MD  apixaban (ELIQUIS) 2.5 MG TABS tablet Take 1 tablet (2.5 mg total) by mouth 2 (two) times daily. 01/14/18  Yes Minus Breeding, MD  diltiazem (CARDIZEM) 30 MG tablet Take 1 tablet (30 mg total) by mouth 2 (two) times daily. Patient taking differently: Take 30 mg by mouth daily.  08/10/18  Yes Plotnikov, Evie Lacks, MD  finasteride (PROSCAR) 5 MG tablet Take 1 tablet (5 mg total) by mouth daily. 08/10/18  Yes Plotnikov, Evie Lacks, MD  linaclotide Rolan Lipa) 290 MCG CAPS capsule Take 1 capsule (290 mcg total) by mouth daily before breakfast. 08/10/18  Yes Plotnikov, Evie Lacks, MD  nitrofurantoin, macrocrystal-monohydrate, (MACROBID) 100 MG capsule Take 100 mg by mouth 2 (two) times daily. 10/09/18  Yes [provider]  polyethylene glycol (MIRALAX / GLYCOLAX) packet Take 17 g by mouth at bedtime.   Yes [provider]  pravastatin (PRAVACHOL) 20 MG tablet TAKE 1 TABLET EACH DAY. Patient taking differently: Take 20 mg by mouth daily.  04/09/18  Yes Plotnikov, Evie Lacks, MD  senna (SENOKOT) 8.6 MG TABS tablet Take 1-2 tablets (8.6-17.2 mg total) by mouth at bedtime. Patient taking differently: Take 1-2 tablets by mouth at bedtime as needed for mild constipation.  08/10/18  Yes Plotnikov, Evie Lacks, MD  torsemide (DEMADEX) 10 MG tablet Take 1 tablet (10 mg total) by mouth daily.  09/30/18  Yes Plotnikov, Evie Lacks, MD  VENTOLIN HFA 108 (90 Base) MCG/ACT inhaler Inhale 2 puffs into the lungs every 6 (six) hours as needed for wheezing or shortness of breath.  03/19/18  Yes [provider]  ranitidine (ZANTAC) 150 MG tablet Take 1 tablet (150 mg total) by mouth 2 (two) times daily as needed for heartburn. Patient not taking: Reported on 10/14/2018 10/24/17   Plotnikov, Evie Lacks, MD  traMADol (ULTRAM) 50 MG tablet Take 0.5-1 tablets (25-50 mg total) by mouth every 6 (six) hours as needed for severe pain. Patient not taking: Reported on 10/14/2018 05/06/18   Plotnikov, Evie Lacks, MD    Physical Exam: BP (!) 117/91   Pulse (!) 53   Temp (!) 97.3 F (36.3 C) (Oral)   Resp (!) 23   SpO2 96%   Constitutional elderly male, no distress  Eyes: Right eye blind, EOMI in left eye,  ENMT: Oropharynx with moist mucous membranes, normal dentition Cardiovascular: Irregularly irregular, normal rate, no appreciable murmurs, 2+ pitting edema and bilateral lower legs to the ankle Respiratory: Normal respiratory effort, clear  breath sounds  Abdomen: Soft,non-tender to palpation, diminished bowel sounds Skin: No rash ulcers, or lesions. Without skin tenting  Neurologic: Grossly no focal neuro deficit. Psychiatric:Appropriate affect, and mood. Mental status AAOx3          Labs on Admission:  Basic Metabolic Panel: Recent Labs  Lab 10/14/18 1031  NA 140  K 4.4  CL 103  CO2 31  GLUCOSE 99  BUN 34*  CREATININE 1.66*  CALCIUM 8.8*   Liver Function Tests: Recent Labs  Lab 10/14/18 1031  AST 25  ALT 13  ALKPHOS 77  BILITOT 1.5*  PROT 7.3  ALBUMIN 3.9   Recent Labs  Lab 10/14/18 1031  LIPASE 32   No results for input(s): AMMONIA in the last 168 hours. CBC: Recent Labs  Lab 10/14/18 1031  WBC 6.4  HGB 16.1  HCT 51.6  MCV 102.2*  PLT 143*   Cardiac Enzymes: No results for input(s): CKTOTAL, CKMB, CKMBINDEX, TROPONINI in the last 168 hours.  BNP  (last 3 results) Recent Labs    03/30/18 1750  BNP 758.0*    ProBNP (last 3 results) No results for input(s): PROBNP in the last 8760 hours.  CBG: No results for input(s): GLUCAP in the last 168 hours.  Radiological Exams on Admission: Dg Chest 2 View  Result Date: 10/14/2018 CLINICAL DATA:  Left lower chest pain since a fall 2 weeks ago. EXAM: CHEST - 2 VIEW COMPARISON:  Chest x-rays dated 06/15/2018 and 03/30/2018 and 03/11/2018 FINDINGS: There are small to moderate bilateral pleural effusions which are either chronic or recurrent but slightly increased on the left since the most recent chest x-ray. Heart size and vascularity are normal. Aortic atherosclerosis. No acute bony abnormality. Severe arthritic changes of both shoulders. No visible rib fractures IMPRESSION: 1. Small to moderate bilateral pleural effusions, chronic but increased on the left. 2.  Aortic Atherosclerosis (ICD10-I70.0). 3. No visible acute rib fractures on this chest x-ray. Electronically Signed   By: Lorriane Shire M.D.   On: 10/14/2018 12:00   Ct Renal Stone Study  Result Date: 10/14/2018 CLINICAL DATA:  Left lower quadrant pain for 2 days, history of recent fall, initial encounter EXAM: CT ABDOMEN AND PELVIS WITHOUT CONTRAST TECHNIQUE: Multidetector CT imaging of the abdomen and pelvis was performed following the standard protocol without IV contrast. COMPARISON:  06/15/2018 FINDINGS: Lower chest: Lung bases demonstrate bilateral pleural effusions. Associated consolidation is noted in the lower lobes. Calcified granuloma is noted in the right lower lobe. Additionally there is focal hypodensity identified within the left lower lobe pulmonary arterial branch. No contrast was administered for this exam although this raises suspicion for possible pulmonary embolism. Coronary calcifications are noted as well. Hepatobiliary: No focal liver abnormality is seen. Status post cholecystectomy. No biliary dilatation. Pancreas:  Unremarkable. No pancreatic ductal dilatation or surrounding inflammatory changes. Spleen: Normal in size without focal abnormality. Adrenals/Urinary Tract: Adrenal glands are within normal limits bilaterally. Kidneys are well visualized bilaterally with multiple cysts stable from the previous exam. Chronic left UPJ obstructive change is again noted and stable. Nonobstructing renal calculi are noted on the left new from the prior study. No ureteral stones are noted. The bladder is decompressed. Stomach/Bowel: Mild diverticular changes noted without evidence of diverticulitis. The appendix is well visualized extending into a right inguinal hernia. No obstructive changes are seen. The small bowel and stomach appear within normal limits. Vascular/Lymphatic: Aortic atherosclerosis. No enlarged abdominal or pelvic lymph nodes. Reproductive: Prostate is unremarkable. Other: Fat containing inguinal  hernias are noted bilaterally. The right inguinal hernia again demonstrates a portion of the cecum and appendix within. No a car serration is noted. No ascites is seen. Musculoskeletal: Degenerative change without acute abnormality. IMPRESSION: Hypodensity within the left lower lobe pulmonary arterial branches suspicious for pulmonary embolism although no contrast was administered. CTA of the chest may be helpful for further evaluation. Bilateral pleural effusions and lower lobe consolidation/atelectasis. Diverticulosis without diverticulitis. Chronic changes in the kidneys as described with evidence of nonobstructing stones on the left. The largest of these measures approximately 5 mm. Fat containing inguinal hernias bilaterally. The appendix and a portion of the cecum are again noted within the right hernia although no incarceration is seen. Electronically Signed   By: [name] M.D.   On: 10/14/2018 12:24    Assessment/Plan Present on Admission: . Acute pyelonephritis . Chronic atrial fibrillation  Active  Problems:   Chronic anticoagulation   Chronic atrial fibrillation   Acute pyelonephritis  Acute pyelonephritis Infection apparent on UA, with right-sided flank pain Nonobstructive kidney stone (5 mm) on left side on CT abdomen otherwise no acute abnormalities or other localizing complications No signs or symptoms of sepsis - Continue empiric IV ceftriaxone, await urine culture  Hypodensity in pulmonary artery vasculature Unclear clinical significance Found on abdominal CT imaging No overt symptoms, no new oxygen requirements -Obtain VQ scan (contrast limited given CKD) disease -Monitor for any oxygen requirements  Chronic A. fib Rate controlled -Continue home amiodarone, diltiazem, Eliquis  Chronic CHF with reduced EF (TEE on 03/2018, EF 20%) Has bilateral ankle swelling Chest x-ray and CT showed small to moderate bilateral pleural effusions (actually improved from large pleural effusions on 11/2 cervical spine imaging) without any oxygen requirements Still at dry weight of 177 pounds -Continue home Demadex -Daily weights, strict I's and O's  BPH -Continue finasteride  Hyperlipidemia, stable Continue pravastatin  CKD, stage III, stable Creatinine stable at baseline -Serial BMP, avoid nephrotoxins   DVT prophylaxis: Home Eliquis  Code Status: DNR, discussed on day of admission  Family Communication: No family at bedside    Consults called: None  Admission status: Admitted as observation to telemetry unit, pending urine culture results and ability to de-escalate of IV antibiotics within time.,  As well as VQ scan.  Anticipate being in hospital less than 2 midnights.      Desiree Hane MD [facility]  Pager [phone]  If 7PM-7AM, please contact night-coverage [email or web address] Password [email or web address]  10/14/2018, 1:35 PM

## 2018-10-15 DIAGNOSIS — N10 Acute tubulo-interstitial nephritis: Secondary | ICD-10-CM | POA: Diagnosis not present

## 2018-10-15 DIAGNOSIS — N400 Enlarged prostate without lower urinary tract symptoms: Secondary | ICD-10-CM | POA: Diagnosis not present

## 2018-10-15 DIAGNOSIS — I482 Chronic atrial fibrillation, unspecified: Secondary | ICD-10-CM | POA: Diagnosis not present

## 2018-10-15 DIAGNOSIS — I5042 Chronic combined systolic (congestive) and diastolic (congestive) heart failure: Secondary | ICD-10-CM | POA: Diagnosis not present

## 2018-10-15 DIAGNOSIS — Z7901 Long term (current) use of anticoagulants: Secondary | ICD-10-CM | POA: Diagnosis not present

## 2018-10-15 LAB — CBC
HCT: 47 % (ref 39.0–52.0)
HEMOGLOBIN: 14.7 g/dL (ref 13.0–17.0)
MCH: 31 pg (ref 26.0–34.0)
MCHC: 31.3 g/dL (ref 30.0–36.0)
MCV: 99.2 fL (ref 80.0–100.0)
Platelets: 120 10*3/uL — ABNORMAL LOW (ref 150–400)
RBC: 4.74 MIL/uL (ref 4.22–5.81)
RDW: 14.8 % (ref 11.5–15.5)
WBC: 5.2 10*3/uL (ref 4.0–10.5)
nRBC: 0 % (ref 0.0–0.2)

## 2018-10-15 LAB — BASIC METABOLIC PANEL
Anion gap: 7 (ref 5–15)
BUN: 28 mg/dL — AB (ref 8–23)
CO2: 27 mmol/L (ref 22–32)
Calcium: 8.1 mg/dL — ABNORMAL LOW (ref 8.9–10.3)
Chloride: 107 mmol/L (ref 98–111)
Creatinine, Ser: 1.54 mg/dL — ABNORMAL HIGH (ref 0.61–1.24)
GFR calc Af Amer: 43 mL/min — ABNORMAL LOW (ref >60)
GFR, EST NON AFRICAN AMERICAN: 38 mL/min — AB (ref >60)
GLUCOSE: 103 mg/dL — AB (ref 70–99)
POTASSIUM: 4.4 mmol/L (ref 3.5–5.1)
Sodium: 141 mmol/L (ref 135–145)

## 2018-10-15 MED ORDER — GUAIFENESIN ER 600 MG PO TB12
1200.0000 mg | ORAL_TABLET | Freq: Two times a day (BID) | ORAL | Status: DC
  Administered 2018-10-15 – 2018-10-17 (×4): 1200 mg via ORAL
  Filled 2018-10-15 (×4): qty 2

## 2018-10-15 NOTE — Progress Notes (Signed)
PROGRESS NOTE    [name] [name]  KDT:[id number] DOB: 04-02-1920 DOA: 10/14/2018 PCP: Cassandria Anger, MD   Brief Narrative: [name] [name] is a 84 y.o. male with medical history significant for CHF with reduced EF (20%, 03/2018), chronic atrial fibrillation on Eliquis who presented with left-sided abdominal pain x2 days. CT scan significant for non-obstructing kidney stone. Blood and urine cultures obtained.   Assessment & Plan:   Active Problems:   BPH (benign prostatic hyperplasia)   CKD (chronic kidney disease) stage 3, GFR 30-59 ml/min (HCC)   Congestive dilated cardiomyopathy (HCC)   Chronic anticoagulation   Chronic atrial fibrillation   Chronic combined systolic and diastolic CHF (congestive heart failure) ([place])   Acute pyelonephritis   Acute pyelonephritis Working diagnosis. Urinalysis concerning for UTI. CT scan without evidence of pyelonephritis. -Continue Ceftriaxone -Urine/blood cultures pending  Hypodensity in pulmonary artery vasculature V/Q scan indeterminate for PE in setting of bilateral pleural effusions.   Chronic atrial fibrillation Rate controlled. -Continue amiodarone, diltiazem, Eliquis  Chronic systolic heart failure EF of 20%. Euvolemic mostly with mild LE edema and stable pleural effusions. -Continue Demadex -Strict in/out  BPH Likely contributing to possible UTI -Continue finasteride  Hyperlipidemia -Continue pravastatin  CKD stage III Baseline.  Cough New. Dry cough. Weak cough on exam. Patient states that he has choked one time on his food but generally does not cough much with eating. -SLP evaluation -Mucinex  DVT prophylaxis: Eliquis Code Status:   Code Status: DNR Family Communication: None at bedside Disposition Plan: Discharge home in 24 to 48 hours pending culture data   Consultants:   None  Procedures:   None  Antimicrobials:  Ceftriaxone    Subjective: Some left flank pain with cough  Objective: Vitals:     10/14/18 2020 10/15/18 0432 10/15/18 0505 10/15/18 1153  BP: 111/71 116/83  97/80  Pulse: 78 90  93  Resp: 20 18  20   Temp: 97.7 F (36.5 C) 98.3 F (36.8 C)  (!) 97.5 F (36.4 C)  TempSrc: Oral   Oral  SpO2: 96% 96%  95%  Weight:   79.5 kg   Height:        Intake/Output Summary (Last 24 hours) at 10/15/2018 1650 Last data filed at 10/15/2018 1620 Gross per 24 hour  Intake 327.68 ml  Output 700 ml  Net -372.32 ml   Filed Weights   10/14/18 1541 10/15/18 0505  Weight: 77.9 kg 79.5 kg    Examination:  General exam: Appears calm and comfortable  Respiratory system: Rhonchi bilaterally with diminished breath sounds at bases. Respiratory effort normal. Cardiovascular system: S1 & S2 heard, RRR. No murmurs, rubs, gallops or clicks. Gastrointestinal system: Abdomen is nondistended, soft and nontender. No organomegaly or masses felt. Normal bowel sounds heard. Central nervous system: Alert and oriented. No focal neurological deficits. Extremities: No edema. No calf tenderness Skin: No cyanosis. No rashes Psychiatry: Judgement and insight appear normal. Mood & affect appropriate.     Data Reviewed: I have personally reviewed following labs and imaging studies  CBC: Recent Labs  Lab 10/14/18 1031 10/15/18 0429  WBC 6.4 5.2  HGB 16.1 14.7  HCT 51.6 47.0  MCV 102.2* 99.2  PLT 143* 998*   Basic Metabolic Panel: Recent Labs  Lab 10/14/18 1031 10/15/18 0429  NA 140 141  K 4.4 4.4  CL 103 107  CO2 31 27  GLUCOSE 99 103*  BUN 34* 28*  CREATININE 1.66* 1.54*  CALCIUM 8.8* 8.1*   GFR: Estimated Creatinine  Clearance: 27.1 mL/min (A) (by C-G formula based on SCr of 1.54 mg/dL (H)). Liver Function Tests: Recent Labs  Lab 10/14/18 1031  AST 25  ALT 13  ALKPHOS 77  BILITOT 1.5*  PROT 7.3  ALBUMIN 3.9   Recent Labs  Lab 10/14/18 1031  LIPASE 32   No results for input(s): AMMONIA in the last 168 hours. Coagulation Profile: No results for input(s): INR,  PROTIME in the last 168 hours. Cardiac Enzymes: No results for input(s): CKTOTAL, CKMB, CKMBINDEX, TROPONINI in the last 168 hours. BNP (last 3 results) No results for input(s): PROBNP in the last 8760 hours. HbA1C: No results for input(s): HGBA1C in the last 72 hours. CBG: No results for input(s): GLUCAP in the last 168 hours. Lipid Profile: No results for input(s): CHOL, HDL, LDLCALC, TRIG, CHOLHDL, LDLDIRECT in the last 72 hours. Thyroid Function Tests: No results for input(s): TSH, T4TOTAL, FREET4, T3FREE, THYROIDAB in the last 72 hours. Anemia Panel: No results for input(s): VITAMINB12, FOLATE, FERRITIN, TIBC, IRON, RETICCTPCT in the last 72 hours. Sepsis Labs: Recent Labs  Lab 10/14/18 1353  LATICACIDVEN 1.62    Recent Results (from the past 240 hour(s))  Culture, blood (routine x 2)     Status: None (Preliminary result)   Collection Time: 10/14/18  1:21 PM  Result Value Ref Range Status   Specimen Description   Final    LEFT ANTECUBITAL Performed at [facility] [address] [place], [place] [address]    Special Requests   Final    BOTTLES DRAWN AEROBIC AND ANAEROBIC Blood Culture adequate volume Performed at [facility] [address], [place], [place] [address]    Culture   Final    NO GROWTH < 24 HOURS Performed at [facility] [address], [place], [place] [address]    Report Status PENDING  Incomplete  Culture, blood (routine x 2)     Status: None (Preliminary result)   Collection Time: 10/14/18  3:50 PM  Result Value Ref Range Status   Specimen Description   Final    BLOOD RIGHT HAND Performed at [facility] [address], [place], [place] [address]    Special Requests   Final    BOTTLES DRAWN AEROBIC AND ANAEROBIC Blood Culture adequate volume Performed at [facility] [address] [place], [place] [address]    Culture   Final    NO GROWTH < 24  HOURS Performed at [facility] [address] [place], [place] [address]    Report Status PENDING  Incomplete         Radiology Studies: Dg Chest 2 View  Result Date: 10/14/2018 CLINICAL DATA:  Left lower chest pain since a fall 2 weeks ago. EXAM: CHEST - 2 VIEW COMPARISON:  Chest x-rays dated 06/15/2018 and 03/30/2018 and 03/11/2018 FINDINGS: There are small to moderate bilateral pleural effusions which are either chronic or recurrent but slightly increased on the left since the most recent chest x-ray. Heart size and vascularity are normal. Aortic atherosclerosis. No acute bony abnormality. Severe arthritic changes of both shoulders. No visible rib fractures IMPRESSION: 1. Small to moderate bilateral pleural effusions, chronic but increased on the left. 2.  Aortic Atherosclerosis (ICD10-I70.0). 3. No visible acute rib fractures on this chest x-ray. Electronically Signed   By: Lorriane Shire M.D.   On: 10/14/2018 12:00   Nm Pulmonary Vent And Perf (v/q Scan)  Result Date: 10/14/2018 CLINICAL DATA:  Abnormal CT EXAM: NUCLEAR MEDICINE VENTILATION - PERFUSION LUNG SCAN TECHNIQUE: Ventilation images were obtained in multiple projections using inhaled aerosol Tc-44m DTPA. Perfusion images were obtained in multiple projections after intravenous injection of Tc-18m MAA. RADIOPHARMACEUTICALS:  29 mCi of Tc-43m DTPA aerosol inhalation and 4.1 mCi Tc54m MAA IV COMPARISON:  10/14/2018 CT FINDINGS: Ventilation: Heterogenous ventilation which limits evaluation for emboli. Large defect in the left lower lobe. Perfusion: Large left lower lobe perfusion defect. Corresponding radiographic airspace disease. IMPRESSION: Very limited study for evaluation of emboli due to significantly heterogeneous ventilation. Overall intermediate probability for pulmonary embolus, due to moderate left pleural effusion with large matched defect in the left lower lobe. Electronically Signed   By: Donavan Foil M.D.   On:  10/14/2018 17:34   Ct Renal Stone Study  Result Date: 10/14/2018 CLINICAL DATA:  Left lower quadrant pain for 2 days, history of recent fall, initial encounter EXAM: CT ABDOMEN AND PELVIS WITHOUT CONTRAST TECHNIQUE: Multidetector CT imaging of the abdomen and pelvis was performed following the standard protocol without IV contrast. COMPARISON:  06/15/2018 FINDINGS: Lower chest: Lung bases demonstrate bilateral pleural effusions. Associated consolidation is noted in the lower lobes. Calcified granuloma is noted in the right lower lobe. Additionally there is focal hypodensity identified within the left lower lobe pulmonary arterial branch. No contrast was administered for this exam although this raises suspicion for possible pulmonary embolism. Coronary calcifications are noted as well. Hepatobiliary: No focal liver abnormality is seen. Status post cholecystectomy. No biliary dilatation. Pancreas: Unremarkable. No pancreatic ductal dilatation or surrounding inflammatory changes. Spleen: Normal in size without focal abnormality. Adrenals/Urinary Tract: Adrenal glands are within normal limits bilaterally. Kidneys are well visualized bilaterally with multiple cysts stable from the previous exam. Chronic left UPJ obstructive change is again noted and stable. Nonobstructing renal calculi are noted on the left new from the prior study. No ureteral stones are noted. The bladder is decompressed. Stomach/Bowel: Mild diverticular changes noted without evidence of diverticulitis. The appendix is well visualized extending into a right inguinal hernia. No obstructive changes are seen. The small bowel and stomach appear within normal limits. Vascular/Lymphatic: Aortic atherosclerosis. No enlarged abdominal or pelvic lymph nodes. Reproductive: Prostate is unremarkable. Other: Fat containing inguinal hernias are noted bilaterally. The right inguinal hernia again demonstrates a portion of the cecum and appendix within. No a car  serration is noted. No ascites is seen. Musculoskeletal: Degenerative change without acute abnormality. IMPRESSION: Hypodensity within the left lower lobe pulmonary arterial branches suspicious for pulmonary embolism although no contrast was administered. CTA of the chest may be helpful for further evaluation. Bilateral pleural effusions and lower lobe consolidation/atelectasis. Diverticulosis without diverticulitis. Chronic changes in the kidneys as described with evidence of nonobstructing stones on the left. The largest of these measures approximately 5 mm. Fat containing inguinal hernias bilaterally. The appendix and a portion of the cecum are again noted within the right hernia although no incarceration is seen. Electronically Signed   By: [name] M.D.   On: 10/14/2018 12:24        Scheduled Meds: . ALPRAZolam  1 mg Oral QHS  . amiodarone  200 mg Oral Daily  . apixaban  2.5 mg Oral BID  . diltiazem  30 mg Oral BID  . finasteride  5 mg Oral Daily  . guaiFENesin  1,200 mg Oral BID  . linaclotide  290 mcg Oral QAC breakfast  . polyethylene glycol  17 g Oral QHS  . pravastatin  20 mg Oral Daily  .  torsemide  10 mg Oral Daily   Continuous Infusions: . cefTRIAXone (ROCEPHIN)  IV 1 g (10/15/18 1551)     LOS: 0 days     Cordelia Poche, MD [facility] 10/15/2018, 4:50 PM  If 7PM-7AM, please contact night-coverage [email or web address]

## 2018-10-16 DIAGNOSIS — N400 Enlarged prostate without lower urinary tract symptoms: Secondary | ICD-10-CM | POA: Diagnosis not present

## 2018-10-16 DIAGNOSIS — I5042 Chronic combined systolic (congestive) and diastolic (congestive) heart failure: Secondary | ICD-10-CM | POA: Diagnosis not present

## 2018-10-16 DIAGNOSIS — Z7901 Long term (current) use of anticoagulants: Secondary | ICD-10-CM | POA: Diagnosis not present

## 2018-10-16 DIAGNOSIS — N10 Acute tubulo-interstitial nephritis: Secondary | ICD-10-CM | POA: Diagnosis not present

## 2018-10-16 DIAGNOSIS — I482 Chronic atrial fibrillation, unspecified: Secondary | ICD-10-CM | POA: Diagnosis not present

## 2018-10-16 MED ORDER — BISACODYL 10 MG RE SUPP
10.0000 mg | Freq: Once | RECTAL | Status: AC
  Administered 2018-10-16: 10 mg via RECTAL
  Filled 2018-10-16: qty 1

## 2018-10-16 MED ORDER — POLYETHYLENE GLYCOL 3350 17 G PO PACK
17.0000 g | PACK | Freq: Every day | ORAL | Status: DC
  Administered 2018-10-17: 17 g via ORAL
  Filled 2018-10-16: qty 1

## 2018-10-16 MED ORDER — POLYETHYLENE GLYCOL 3350 17 G PO PACK
17.0000 g | PACK | Freq: Two times a day (BID) | ORAL | Status: DC
  Administered 2018-10-16: 17 g via ORAL
  Filled 2018-10-16: qty 1

## 2018-10-16 MED ORDER — DICLOFENAC SODIUM 1 % TD GEL
2.0000 g | Freq: Four times a day (QID) | TRANSDERMAL | Status: DC
  Administered 2018-10-16 – 2018-10-17 (×3): 2 g via TOPICAL
  Filled 2018-10-16: qty 100

## 2018-10-16 NOTE — Evaluation (Signed)
Physical Therapy Evaluation Patient Details Name: [name] [name] MRN: [id number] DOB: 08/06/1920 Today's Date: 10/16/2018   History of Present Illness  84 y.o. male with medical history significant for CHF with reduced EF (20%, 03/2018), chronic atrial fibrillation on Eliquis, right eye blindness and who presented with left-sided abdominal pain x2 days. CT scan significant for non-obstructing kidney stone.  Diagnosed with Acute pyelonephritis.  Clinical Impression  Pt admitted with above diagnosis. Pt currently with functional limitations due to the deficits listed below (see PT Problem List).  Pt will benefit from skilled PT to increase their independence and safety with mobility to allow discharge to the venue listed below.   Pt assisted with ambulating within room (due to urinary incontinence) and over to recliner.  Pt reports his son is a retired PT.  Pt hopeful to d/c home soon.  Recommend initial 24/7 assist for safety upon d/c.    Follow Up Recommendations Home health PT;Supervision/Assistance - 24 hour    Equipment Recommendations  None recommended by PT    Recommendations for Other Services       Precautions / Restrictions Precautions Precautions: Fall Precaution Comments: urinary incontinence      Mobility  Bed Mobility Overal bed mobility: Needs Assistance Bed Mobility: Supine to Sit     Supine to sit: Supervision;HOB elevated        Transfers Overall transfer level: Needs assistance Equipment used: None;1 person hand held assist Transfers: Sit to/from Stand Sit to Stand: Min assist         General transfer comment: pt attempting to rise without assistive device in place, so HHA provided, assist for steadying required, pt with urinary urgency and provided with urinal  Ambulation/Gait Ambulation/Gait assistance: Min guard Gait Distance (Feet): 12 Feet Assistive device: Straight cane Gait Pattern/deviations: Step-through pattern;Decreased stride length;Narrow  base of support     General Gait Details: short steps, pt used SPC as he is used to this at home, min/guard for safety, ambulated within room only due to urinary incontinence  Stairs            Wheelchair Mobility    Modified Rankin (Stroke Patients Only)       Balance Overall balance assessment: History of Falls                                           Pertinent Vitals/Pain Pain Assessment: No/denies pain Pain Intervention(s): Repositioned;Monitored during session    Home Living Family/patient expects to be discharged to:: Private residence Living Arrangements: Alone Available Help at Discharge: Family;Friend(s) Type of Home: House Home Access: Stairs to enter Entrance Stairs-Rails: Psychiatric nurse of Steps: 3 Home Layout: One level Home Equipment: Cane - single point;Walker - 2 wheels;Bedside commode Additional Comments: still drives     Prior Function Level of Independence: Independent with assistive device(s)         Comments: amb with SPC     Hand Dominance        Extremity/Trunk Assessment        Lower Extremity Assessment Lower Extremity Assessment: Generalized weakness       Communication   Communication: No difficulties(blind R eye)  Cognition Arousal/Alertness: Awake/alert Behavior During Therapy: WFL for tasks assessed/performed Overall Cognitive Status: Within Functional Limits for tasks assessed  General Comments      Exercises     Assessment/Plan    PT Assessment Patient needs continued PT services  PT Problem List Decreased strength;Decreased mobility;Decreased activity tolerance;Decreased balance;Decreased knowledge of use of DME       PT Treatment Interventions DME instruction;Therapeutic activities;Gait training;Therapeutic exercise;Patient/family education;Functional mobility training;Stair training;Balance training    PT  Goals (Current goals can be found in the Care Plan section)  Acute Rehab PT Goals PT Goal Formulation: With patient Time For Goal Achievement: 10/23/18 Potential to Achieve Goals: Good    Frequency Min 3X/week   Barriers to discharge        Co-evaluation               AM-PAC PT "6 Clicks" Mobility  Outcome Measure Help needed turning from your back to your side while in a flat bed without using bedrails?: A Little Help needed moving from lying on your back to sitting on the side of a flat bed without using bedrails?: A Little Help needed moving to and from a bed to a chair (including a wheelchair)?: A Little Help needed standing up from a chair using your arms (e.g., wheelchair or bedside chair)?: A Little Help needed to walk in hospital room?: A Little Help needed climbing 3-5 steps with a railing? : A Lot 6 Click Score: 17    End of Session Equipment Utilized During Treatment: Gait belt Activity Tolerance: Patient tolerated treatment well Patient left: in chair;with chair alarm set;with call bell/phone within reach Nurse Communication: Mobility status PT Visit Diagnosis: Other abnormalities of gait and mobility (R26.89)    Time: 4166-0630 PT Time Calculation (min) (ACUTE ONLY): 18 min   Charges:   PT Evaluation $PT Eval Low Complexity: [place], PT, DPT Acute Rehabilitation Services Office: [phone] Pager: [phone]  Trena Platt 10/16/2018, 2:01 PM

## 2018-10-16 NOTE — Progress Notes (Addendum)
Pt able to tolerate half of the soap suds enema and hold it for over 5 minutes.  Per NT the patient had  watery/light yellow output in the bedpan. PT stated that he felt like he passed a lot of gas.  MD made aware that there was no stool after enema was administered.

## 2018-10-16 NOTE — Evaluation (Signed)
Clinical/Bedside Swallow Evaluation Patient Details  Name: [name] [name] MRN: [id number] Date of Birth: [birth year]  Today's Date: 10/16/2018 Time: SLP Start Time (ACUTE ONLY): 1230 SLP Stop Time (ACUTE ONLY): 1245 SLP Time Calculation (min) (ACUTE ONLY): 15 min  Past Medical History:  Past Medical History:  Diagnosis Date  . Anxiety   . Arthritis    "shoulders" (05/28/2016)  . Cardiomyopathy   . Coronary artery disease   . Depression    hx (05/28/2016)  . Hemorrhoids   . Hyperlipidemia   . Hypertension   . Paroxysmal atrial fibrillation (HCC)   . Skipped heart beats    Past Surgical History:  Past Surgical History:  Procedure Laterality Date  . APPENDECTOMY    . CARDIOVERSION N/A 06/04/2016   Procedure: CARDIOVERSION;  Surgeon: Pixie Casino, MD;  Location: [facility] ENDOSCOPY;  Service: Cardiovascular;  Laterality: N/A;  . CARDIOVERSION N/A 04/09/2017   Procedure: CARDIOVERSION;  Surgeon: Thayer Headings, MD;  Location: [facility] ENDOSCOPY;  Service: Cardiovascular;  Laterality: N/A;  . CARDIOVERSION N/A 04/01/2018   Procedure: CARDIOVERSION;  Surgeon: Skeet Latch, MD;  Location: [facility];  Service: Cardiovascular;  Laterality: N/A;  . CATARACT EXTRACTION Left   . CHOLECYSTECTOMY OPEN    . COLONOSCOPY W/ BIOPSIES AND POLYPECTOMY    . INGUINAL HERNIA REPAIR Bilateral   . MYRINGOTOMY WITH TUBE PLACEMENT Bilateral   . skin cancer removal Right    side of nose by Rt eye  . TEE WITHOUT CARDIOVERSION N/A 06/04/2016   Procedure: TRANSESOPHAGEAL ECHOCARDIOGRAM (TEE);  Surgeon: Pixie Casino, MD;  Location: [facility] ENDOSCOPY;  Service: Cardiovascular;  Laterality: N/A;  . TEE WITHOUT CARDIOVERSION N/A 04/01/2018   Procedure: TRANSESOPHAGEAL ECHOCARDIOGRAM (TEE);  Surgeon: Skeet Latch, MD;  Location: [facility] ENDOSCOPY;  Service: Cardiovascular;  Laterality: N/A;  . TONSILLECTOMY AND ADENOIDECTOMY     HPI:  84 yo male adm to [facility] with acute pyelonephritis - PMH + CHF and 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   Now with possible PE in lower pulmonary vasculature and current UTI with left flank pain.  Swallow eval ordered.  Pt denies dysphagia prior to admit and admits to pain with cough.  He admits to dry cough x few weeks, denies coorleated to po.    Assessment / Plan / Recommendation Clinical Impression  Pt presents with functional oropharyngeal swallow ability - no severe indications of aspiration.  He does have a weak volitional cough - but admits this is due to pain with cough - At baseline, pt reports cough is strong.  Subtle cough x1 with stringy beef = ? due to oral residuals.  No further episodes noted.  Recommend continue diet with general precautions. Educated pt to findings/recommendations and encouraged pt to maintain strength of cough and "hock" for airway protection.    Left him information re: heimlich manuever and reviewed universal sign of choking using teach back/return demonstration.   SLP Visit Diagnosis: Dysphagia, unspecified (R13.10)    Aspiration Risk  Mild aspiration risk    Diet Recommendation Regular;Thin liquid   Liquid Administration via: Cup;Straw Medication Administration: Whole meds with liquid Supervision: Patient able to self feed Compensations: Slow rate;Small sips/bites Postural Changes: Seated upright at 90 degrees;Remain upright for at least 30 minutes after po intake    Other  Recommendations Oral Care Recommendations: Oral care BID   Follow up Recommendations None      Frequency and Duration      n/a      Prognosis     n/a  Swallow Study   General Date of Onset: 10/16/18 HPI: 84 yo male adm to [facility] with acute pyelonephritis - PMH + CHF and 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  Now with possible PE in lower pulmonary vasculature and current UTI with left flank pain.  Swallow eval ordered.  Pt denies dysphagia prior to admit and admits to pain with cough.  He admits to dry cough x few weeks, denies coorleated to po.  Type of Study: Bedside Swallow Evaluation Diet  Prior to this Study: Regular;Thin liquids Temperature Spikes Noted: No Respiratory Status: Room air History of Recent Intubation: No Behavior/Cognition: Alert;Cooperative;Pleasant mood Oral Cavity Assessment: Within Functional Limits Oral Care Completed by SLP: No Oral Cavity - Dentition: Adequate natural dentition Vision: Functional for self-feeding Self-Feeding Abilities: Able to feed self Patient Positioning: Upright in bed Baseline Vocal Quality: Hoarse(mildly hoarse, pt reports recent chronic cough) Volitional Cough: Weak Volitional Swallow: Able to elicit    Oral/Motor/Sensory Function Overall Oral Motor/Sensory Function: (? mild facial asymmetry on left, advised pt to examine old pictures)   Ice Chips Ice chips: Not tested   Thin Liquid Thin Liquid: Within functional limits Presentation: Straw;Self Fed    Nectar Thick Nectar Thick Liquid: Not tested   Honey Thick Honey Thick Liquid: Not tested   Puree Puree: Not tested   Solid     Solid: Within functional limits Presentation: Self Fed;Spoon Other Comments: beef and rice      Macario Golds 10/16/2018,12:51 PM    Luanna Salk, [place] Pager [phone] Office [phone]

## 2018-10-16 NOTE — Progress Notes (Signed)
PROGRESS NOTE    [name] [name]  SEG:[id number] DOB: October 17, 1920 DOA: 10/14/2018 PCP: Cassandria Anger, MD   Brief Narrative: [name] [name] is a 84 y.o. male with medical history significant for CHF with reduced EF (20%, 03/2018), chronic atrial fibrillation on Eliquis who presented with left-sided abdominal pain x2 days. CT scan significant for non-obstructing kidney stone. Blood and urine cultures obtained.   Assessment & Plan:   Active Problems:   BPH (benign prostatic hyperplasia)   CKD (chronic kidney disease) stage 3, GFR 30-59 ml/min (HCC)   Congestive dilated cardiomyopathy (HCC)   Chronic anticoagulation   Chronic atrial fibrillation   Chronic combined systolic and diastolic CHF (congestive heart failure) (HCC)   Acute pyelonephritis   Acute pyelonephritis CT scan without evidence of pyelonephritis. Urine culture significant for 30K colonies of proteus mirabilis -Continue Ceftriaxone -Urine/blood cultures pending  Hypodensity in pulmonary artery vasculature V/Q scan indeterminate for PE in setting of bilateral pleural effusions.  Left sided flank pain Appears musculoskeletal -voltaren gel  Chronic atrial fibrillation Rate controlled. -Continue amiodarone, diltiazem, Eliquis  Chronic systolic heart failure EF of 20%. Euvolemic mostly with mild LE edema and stable pleural effusions. -Continue Demadex -Strict in/out  BPH Likely contributing to possible UTI -Continue finasteride  Hyperlipidemia -Continue pravastatin  CKD stage III Baseline.  Cough New. Dry cough. Weak cough on exam. Patient states that he has choked one time on his food but generally does not cough much with eating. -SLP evaluation: mild aspiration risk, regular/thin diet -Mucinex  Constipation -Dulcolax suppository -Enema if suppository does not work  DVT prophylaxis: Eliquis Code Status:   Code Status: DNR Family Communication: None at bedside Disposition Plan: Discharge home in  24 hours pending culture data   Consultants:   None  Procedures:   None  Antimicrobials:  Ceftriaxone    Subjective: Left sided pain still present. Worse with movement and cough  Objective: Vitals:   10/15/18 2107 10/16/18 0500 10/16/18 0506 10/16/18 1410  BP: 121/74  109/86 114/79  Pulse: 100  (!) 104 87  Resp: 16  18 (!) 22  Temp: 98 F (36.7 C)  98.2 F (36.8 C) 97.6 F (36.4 C)  TempSrc: Oral  Oral Oral  SpO2: 97%  92% 93%  Weight:  78.3 kg 79.6 kg   Height:        Intake/Output Summary (Last 24 hours) at 10/16/2018 1544 Last data filed at 10/16/2018 0606 Gross per 24 hour  Intake 820.07 ml  Output 1100 ml  Net -279.93 ml   Filed Weights   10/15/18 0505 10/16/18 0500 10/16/18 0506  Weight: 79.5 kg 78.3 kg 79.6 kg    Examination:  General exam: Appears calm and comfortable Respiratory system: Diminished bilaterally. Respiratory effort normal. Cardiovascular system: S1 & S2 heard, RRR. No murmurs, rubs, gallops or clicks. Gastrointestinal system: Abdomen is nondistended, soft and tender in LUQ/flank. No organomegaly or masses felt. Normal bowel sounds heard. Central nervous system: Alert and oriented. No focal neurological deficits. Extremities: No edema. No calf tenderness Skin: No cyanosis. No rashes Psychiatry: Judgement and insight appear normal. Mood & affect appropriate.    Data Reviewed: I have personally reviewed following labs and imaging studies  CBC: Recent Labs  Lab 10/14/18 1031 10/15/18 0429  WBC 6.4 5.2  HGB 16.1 14.7  HCT 51.6 47.0  MCV 102.2* 99.2  PLT 143* 737*   Basic Metabolic Panel: Recent Labs  Lab 10/14/18 1031 10/15/18 0429  NA 140 141  K 4.4 4.4  CL  103 107  CO2 31 27  GLUCOSE 99 103*  BUN 34* 28*  CREATININE 1.66* 1.54*  CALCIUM 8.8* 8.1*   GFR: Estimated Creatinine Clearance: 27.1 mL/min (A) (by C-G formula based on SCr of 1.54 mg/dL (H)). Liver Function Tests: Recent Labs  Lab 10/14/18 1031  AST  25  ALT 13  ALKPHOS 77  BILITOT 1.5*  PROT 7.3  ALBUMIN 3.9   Recent Labs  Lab 10/14/18 1031  LIPASE 32   No results for input(s): AMMONIA in the last 168 hours. Coagulation Profile: No results for input(s): INR, PROTIME in the last 168 hours. Cardiac Enzymes: No results for input(s): CKTOTAL, CKMB, CKMBINDEX, TROPONINI in the last 168 hours. BNP (last 3 results) No results for input(s): PROBNP in the last 8760 hours. HbA1C: No results for input(s): HGBA1C in the last 72 hours. CBG: No results for input(s): GLUCAP in the last 168 hours. Lipid Profile: No results for input(s): CHOL, HDL, LDLCALC, TRIG, CHOLHDL, LDLDIRECT in the last 72 hours. Thyroid Function Tests: No results for input(s): TSH, T4TOTAL, FREET4, T3FREE, THYROIDAB in the last 72 hours. Anemia Panel: No results for input(s): VITAMINB12, FOLATE, FERRITIN, TIBC, IRON, RETICCTPCT in the last 72 hours. Sepsis Labs: Recent Labs  Lab 10/14/18 1353  LATICACIDVEN 1.62    Recent Results (from the past 240 hour(s))  Urine culture     Status: Abnormal (Preliminary result)   Collection Time: 10/14/18 11:12 AM  Result Value Ref Range Status   Specimen Description   Final    URINE, RANDOM Performed at [facility] [address] [place], [place] [address]    Special Requests   Final    NONE Performed at [facility], [place] [address], [place], [place] [address]    Culture 30,000 COLONIES/mL PROTEUS MIRABILIS (A)  Final   Report Status PENDING  Incomplete  Culture, blood (routine x 2)     Status: None (Preliminary result)   Collection Time: 10/14/18  1:21 PM  Result Value Ref Range Status   Specimen Description   Final    LEFT ANTECUBITAL Performed at [facility] [address] [place], [place] [address]    Special Requests   Final    BOTTLES DRAWN AEROBIC AND ANAEROBIC Blood Culture adequate volume Performed at [facility] [address], [place], [place] [address]    Culture   Final    NO GROWTH 2 DAYS Performed at [facility] [address], [place], [place] [address]    Report Status PENDING  Incomplete  Culture, blood (routine x 2)     Status: None (Preliminary result)   Collection Time: 10/14/18  3:50 PM  Result Value Ref Range Status   Specimen Description   Final    BLOOD RIGHT HAND Performed at [facility] [address], [place], [place] [address]    Special Requests   Final    BOTTLES DRAWN AEROBIC AND ANAEROBIC Blood Culture adequate volume Performed at [facility] [address] [place], [place] [address]    Culture   Final    NO GROWTH 2 DAYS Performed at [facility] [address], [place], [place] [address]    Report Status PENDING  Incomplete         Radiology Studies: Nm Pulmonary Vent And Perf (v/q Scan)  Result Date: 10/14/2018 CLINICAL DATA:  Abnormal CT EXAM: NUCLEAR MEDICINE VENTILATION - PERFUSION LUNG SCAN TECHNIQUE: Ventilation  images were obtained in multiple projections using inhaled aerosol Tc-72m DTPA. Perfusion images were obtained in multiple projections after intravenous injection of Tc-20m MAA. RADIOPHARMACEUTICALS:  29 mCi of Tc-49m DTPA aerosol inhalation and 4.1 mCi Tc58m MAA IV COMPARISON:  10/14/2018 CT FINDINGS: Ventilation: Heterogenous ventilation which limits evaluation for emboli. Large defect in the left lower lobe. Perfusion: Large left lower lobe perfusion defect. Corresponding radiographic airspace disease. IMPRESSION: Very limited study for evaluation of emboli due to significantly heterogeneous ventilation. Overall intermediate probability for pulmonary embolus, due to moderate left pleural effusion with large matched defect in the left lower lobe. Electronically Signed   By: Donavan Foil M.D.   On: 10/14/2018 17:34        Scheduled Meds: . ALPRAZolam  1 mg Oral QHS  . amiodarone  200 mg  Oral Daily  . apixaban  2.5 mg Oral BID  . diltiazem  30 mg Oral BID  . finasteride  5 mg Oral Daily  . guaiFENesin  1,200 mg Oral BID  . linaclotide  290 mcg Oral QAC breakfast  . [START ON 10/17/2018] polyethylene glycol  17 g Oral Daily  . pravastatin  20 mg Oral Daily  . torsemide  10 mg Oral Daily   Continuous Infusions: . cefTRIAXone (ROCEPHIN)  IV 1 g (10/16/18 1346)     LOS: 0 days     Cordelia Poche, MD [facility] 10/16/2018, 3:44 PM  If 7PM-7AM, please contact night-coverage [email or web address]

## 2018-10-17 ENCOUNTER — Observation Stay (HOSPITAL_COMMUNITY): Payer: Medicare Other

## 2018-10-17 DIAGNOSIS — N400 Enlarged prostate without lower urinary tract symptoms: Secondary | ICD-10-CM | POA: Diagnosis not present

## 2018-10-17 DIAGNOSIS — I5042 Chronic combined systolic (congestive) and diastolic (congestive) heart failure: Secondary | ICD-10-CM | POA: Diagnosis not present

## 2018-10-17 DIAGNOSIS — J90 Pleural effusion, not elsewhere classified: Secondary | ICD-10-CM | POA: Diagnosis not present

## 2018-10-17 DIAGNOSIS — I482 Chronic atrial fibrillation, unspecified: Secondary | ICD-10-CM | POA: Diagnosis not present

## 2018-10-17 DIAGNOSIS — N183 Chronic kidney disease, stage 3 (moderate): Secondary | ICD-10-CM | POA: Diagnosis not present

## 2018-10-17 DIAGNOSIS — N10 Acute tubulo-interstitial nephritis: Secondary | ICD-10-CM | POA: Diagnosis not present

## 2018-10-17 LAB — URINE CULTURE

## 2018-10-17 MED ORDER — AMOXICILLIN 500 MG PO CAPS: 500.0000 mg | ORAL_CAPSULE | Freq: Two times a day (BID) | ORAL | 0 refills | Status: DC

## 2018-10-17 MED ORDER — TRAMADOL HCL 50 MG PO TABS: 50.0000 mg | ORAL_TABLET | Freq: Two times a day (BID) | ORAL | 0 refills | Status: DC | PRN

## 2018-10-17 MED ORDER — GUAIFENESIN ER 600 MG PO TB12: 1200.0000 mg | ORAL_TABLET | Freq: Two times a day (BID) | ORAL | 0 refills | Status: AC

## 2018-10-17 MED ORDER — SORBITOL 70 % SOLN
960.0000 mL | TOPICAL_OIL | Freq: Once | ORAL | Status: AC
  Administered 2018-10-17: 960 mL via RECTAL
  Filled 2018-10-17: qty 473

## 2018-10-17 MED ORDER — AMOXICILLIN 250 MG PO CAPS
500.0000 mg | ORAL_CAPSULE | Freq: Two times a day (BID) | ORAL | Status: DC
  Administered 2018-10-17: 500 mg via ORAL
  Filled 2018-10-17: qty 2

## 2018-10-17 MED ORDER — DICLOFENAC SODIUM 1 % TD GEL: 2.0000 g | Freq: Four times a day (QID) | TRANSDERMAL | 0 refills | Status: DC

## 2018-10-17 NOTE — Progress Notes (Signed)
Physical Therapy Treatment Patient Details Name: [name] [name] MRN: [id number] DOB: July 19, 1920 Today's Date: 10/17/2018    History of Present Illness 84 y.o. male with medical history significant for CHF with reduced EF (20%, 03/2018), chronic atrial fibrillation on Eliquis, right eye blindness and who presented with left-sided abdominal pain x2 days. CT scan significant for non-obstructing kidney stone.  Diagnosed with Acute pyelonephritis.    PT Comments    Pt progressing well, incr tiol to activity, improved gait stability with RW (pt agreeable to RW today and states he uses his in the house much of the time) continue PT POC  Follow Up Recommendations  Home health PT;Supervision for mobility/OOB     Equipment Recommendations  None recommended by PT    Recommendations for Other Services       Precautions / Restrictions Precautions Precautions: Fall Precaution Comments: urinary incontinence, condom cath in place today    Mobility  Bed Mobility Overal bed mobility: Needs Assistance Bed Mobility: Supine to Sit     Supine to sit: Min guard     General bed mobility comments: to elevate trunk, 2 attempts, uses rail  Transfers Overall transfer level: Needs assistance Equipment used: Rolling walker (2 wheeled) Transfers: Sit to/from Stand Sit to Stand: Min guard         General transfer comment: cues for hand placement, min/guard to steady on transition to RW  Ambulation/Gait Ambulation/Gait assistance: Supervision;Min guard Gait Distance (Feet): 120 Feet Assistive device: Rolling walker (2 wheeled) Gait Pattern/deviations: Step-to pattern;Decreased stride length     General Gait Details: improved stability and gait pattern with use of RW, min guard to supervision for safety, no LOB   Stairs             Wheelchair Mobility    Modified Rankin (Stroke Patients Only)       Balance Overall balance assessment: History of Falls                                          Cognition Arousal/Alertness: Awake/alert Behavior During Therapy: WFL for tasks assessed/performed Overall Cognitive Status: Within Functional Limits for tasks assessed                                        Exercises      General Comments        Pertinent Vitals/Pain Pain Assessment: No/denies pain    Home Living                      Prior Function            PT Goals (current goals can now be found in the care plan section) Acute Rehab PT Goals PT Goal Formulation: With patient Time For Goal Achievement: 10/23/18 Potential to Achieve Goals: Good Progress towards PT goals: Progressing toward goals    Frequency    Min 3X/week      PT Plan      Co-evaluation              AM-PAC PT "6 Clicks" Mobility   Outcome Measure  Help needed turning from your back to your side while in a flat bed without using bedrails?: A Little Help needed moving from lying on your back to sitting on the side of  a flat bed without using bedrails?: A Little Help needed moving to and from a bed to a chair (including a wheelchair)?: A Little Help needed standing up from a chair using your arms (e.g., wheelchair or bedside chair)?: A Little Help needed to walk in hospital room?: A Little Help needed climbing 3-5 steps with a railing? : A Lot 6 Click Score: 17    End of Session Equipment Utilized During Treatment: Gait belt Activity Tolerance: Patient tolerated treatment well Patient left: in chair;with chair alarm set;with call bell/phone within reach   PT Visit Diagnosis: Other abnormalities of gait and mobility (R26.89)     Time: 1100-1126 PT Time Calculation (min) (ACUTE ONLY): 26 min  Charges:  $Gait Training: 23-37 mins                     Kenyon Ana, PT  Pager: [phone] Acute Rehab Dept [place]): [phone]   10/17/2018    [facility] 10/17/2018, 12:39 PM

## 2018-10-17 NOTE — Discharge Instructions (Addendum)
[name] [name],  You are in the hospital because of a urine infection.  This is being treated with antibiotics.  You will go home with a course of antibiotics.  While you are here, you also had some left-sided flank pain.  This may be secondary to the muscles in your side versus related to your lung.  You will go home with an incentive spirometer to keep your lungs as open as possible.  I also recommend they follow-up with your primary care physician to have a repeat chest x-ray performed to make sure that there is no worsening of your lung.  If you have worsening shortness of breath or chest pain please return for reevaluation. Please take Miralax for your constipation.

## 2018-10-17 NOTE — Discharge Summary (Signed)
Physician Discharge Summary  [name] DQQ:[id number] DOB: [birth year] DOA: 10/14/2018  PCP: Cassandria Anger, MD  Admit date: 10/14/2018 Discharge date: 10/17/2018  Admitted From: Home Disposition: Home  Recommendations for Outpatient Follow-up:  1. Follow up with PCP in 1 week 2. Please obtain BMP/CBC in one week 3. Chest x-ray in 5-7 days 4. Incentive spirometry 5. Please follow up on the following pending results: None  Home Health: PT Equipment/Devices: None  Discharge Condition: Stable CODE STATUS: DNR Diet recommendation: Heart healthy   Brief/Interim Summary:  Admission HPI written by Desiree Hane, MD   Chief Complaint: Abdominal pain  HPI: [name] [name] is a 84 y.o. male with medical history significant for CHF with reduced EF (20%, 03/2018), chronic atrial fibrillation on Eliquis who presents on 10/14/2018 with left-sided abdominal pain x2 days.   Several weeks ago he reports falling onto the concrete floor of his carport after tripping on a limb in his yard.  He was later evaluated at [facility] ED on 11/2 for laceration of his right hand and ecchymosis of his right eye associated with that fall. He was prescribed a short course of Keflex which she has since completed.Marland Kitchen   He now reports for the last two days he has noticed left sided abdominal pain, that was a stabbing sharpness like a knife.  He thought it was because of his prior fall from but he landed on his right side.  Coughing makes the pain worse, no obvious alleviating factors.  Denies any nausea, emesis, fevers or chills.  No recent sick contacts.  No diarrhea.  Regarding his CHF. His PCP changed his lasix to torsemide based off large bilateral pleural effusions noted on CT cervical spine imaging from his recent ED visit(11/2). He denies any worsening lower leg swelling, PND, or orthopnea  No recent travel history, no sick contacts, no family history of blood clots   ED Course: In the  ED he had a temp of 97.3,Respiratory rate 22, otherwise him dynamically stable.  Lactic acid was within normal limits.  Lipase is unremarkable.  Blood cultures x2 were obtained. UA significant for large leukocytes, greater than 50 WBC, greater than 50 RBC, moderate hemoglobin, no bacteria seen.  CMP and CBC showed no acute abnormalities  CT renal stone study showed nonobstructive stones in the left side (approximately 5 mm).  Also noted bilateral pleural effusions, lower lobe consolidation/atelectasis, and left lower lobe pulmonary to arrange hypodensity concerning for potential PE (no contrast was administered so difficult to evaluate)   Hospital course:  UTI CT scan without evidence of pyelonephritis. Urine culture significant for 30K colonies of proteus mirabilis. Patient empirically treated with ceftriaxone and transitioned to amoxicillin.  Hypodensity in pulmonary artery vasculature V/Q scan indeterminate for PE in setting of bilateral pleural effusions.  Left sided flank pain Appears musculoskeletal. Voltaren gel and Tramadol.  Left lung consolidation/collapse Discussed with pulmonology. Chronic. Recommend incentive spirometry and repeat chest x-ray in 5-7 days.  Chronic atrial fibrillation Rate controlled. Continued amiodarone, diltiazem, Eliquis  Chronic systolic heart failure EF of 20%. Euvolemic mostly with mild LE edema and stable pleural effusions. Continued Demadex.  BPH Likely contributing to possible UTI. Continued finasteride  Hyperlipidemia Continued pravastatin  CKD stage III Baseline.  Cough New. Dry cough. Weak cough on exam. Patient states that he has choked one time on his food but generally does not cough much with eating. Speech therapy evaluated and hand no recommendations.  Constipation Patient given dulcolax suppository, soap  suds and SMOG enema with only small amounts of stool.  Discharge Diagnoses:  Active Problems:   BPH (benign  prostatic hyperplasia)   CKD (chronic kidney disease) stage 3, GFR 30-59 ml/min (HCC)   Congestive dilated cardiomyopathy (HCC)   Chronic anticoagulation   Chronic atrial fibrillation   Chronic combined systolic and diastolic CHF (congestive heart failure) ([place])   Acute pyelonephritis    Discharge Instructions  Discharge Instructions    Call MD for:  difficulty breathing, headache or visual disturbances   Complete by:  As directed    Call MD for:  severe uncontrolled pain   Complete by:  As directed    Call MD for:  temperature >100.4   Complete by:  As directed    Diet - low sodium heart healthy   Complete by:  As directed    Increase activity slowly   Complete by:  As directed      Allergies as of 10/17/2018      Reactions   Lisinopril Cough      Medication List    STOP taking these medications   nitrofurantoin (macrocrystal-monohydrate) 100 MG capsule Commonly known as:  MACROBID   ranitidine 150 MG tablet Commonly known as:  ZANTAC     TAKE these medications   ALPRAZolam 1 MG tablet Commonly known as:  XANAX Take 1 tablet (1 mg total) by mouth at bedtime.   amiodarone 200 MG tablet Commonly known as:  PACERONE TAKE 1 TABLET ONCE DAILY.   amoxicillin 500 MG capsule Commonly known as:  AMOXIL Take 1 capsule (500 mg total) by mouth every 12 (twelve) hours for 7 days.   apixaban 2.5 MG Tabs tablet Commonly known as:  ELIQUIS Take 1 tablet (2.5 mg total) by mouth 2 (two) times daily.   diclofenac sodium 1 % Gel Commonly known as:  VOLTAREN Apply 2 g topically 4 (four) times daily.   diltiazem 30 MG tablet Commonly known as:  CARDIZEM Take 1 tablet (30 mg total) by mouth 2 (two) times daily. What changed:  when to take this   finasteride 5 MG tablet Commonly known as:  PROSCAR Take 1 tablet (5 mg total) by mouth daily.   guaiFENesin 600 MG 12 hr tablet Commonly known as:  MUCINEX Take 2 tablets (1,200 mg total) by mouth 2 (two) times daily for 5  days.   linaclotide 290 MCG Caps capsule Commonly known as:  LINZESS Take 1 capsule (290 mcg total) by mouth daily before breakfast.   polyethylene glycol packet Commonly known as:  MIRALAX / GLYCOLAX Take 17 g by mouth at bedtime.   pravastatin 20 MG tablet Commonly known as:  PRAVACHOL TAKE 1 TABLET EACH DAY. What changed:  See the new instructions.   senna 8.6 MG Tabs tablet Commonly known as:  SENOKOT Take 1-2 tablets (8.6-17.2 mg total) by mouth at bedtime. What changed:    when to take this  reasons to take this   torsemide 10 MG tablet Commonly known as:  DEMADEX Take 1 tablet (10 mg total) by mouth daily.   traMADol 50 MG tablet Commonly known as:  ULTRAM Take 1 tablet (50 mg total) by mouth every 12 (twelve) hours as needed for moderate pain or severe pain. What changed:    how much to take  when to take this  reasons to take this   VENTOLIN HFA 108 (90 Base) MCG/ACT inhaler Generic drug:  albuterol Inhale 2 puffs into the lungs every 6 (six) hours as  needed for wheezing or shortness of breath.      Follow-up Information    Plotnikov, Evie Lacks, MD. Schedule an appointment as soon as possible for a visit in 1 week(s).   Specialty:  Internal Medicine Why:  Hospital follow up and repeat chest x-ray Contact information: [address] [place] [address] [phone]          Allergies  Allergen Reactions  . Lisinopril Cough    Consultations:  Pulmonology   Procedures/Studies: Dg Chest 2 View  Result Date: 10/14/2018 CLINICAL DATA:  Left lower chest pain since a fall 2 weeks ago. EXAM: CHEST - 2 VIEW COMPARISON:  Chest x-rays dated 06/15/2018 and 03/30/2018 and 03/11/2018 FINDINGS: There are small to moderate bilateral pleural effusions which are either chronic or recurrent but slightly increased on the left since the most recent chest x-ray. Heart size and vascularity are normal. Aortic atherosclerosis. No acute bony abnormality. Severe  arthritic changes of both shoulders. No visible rib fractures IMPRESSION: 1. Small to moderate bilateral pleural effusions, chronic but increased on the left. 2.  Aortic Atherosclerosis (ICD10-I70.0). 3. No visible acute rib fractures on this chest x-ray. Electronically Signed   By: Lorriane Shire M.D.   On: 10/14/2018 12:00   Dg Ribs Unilateral Left  Result Date: 10/17/2018 CLINICAL DATA:  Left abdominal and flank pain EXAM: LEFT RIBS - 2 VIEW COMPARISON:  10/14/2018 FINDINGS: Cardiomegaly with left pleural effusion noted. Left lower lobe collapse/consolidation persist. Aorta is atherosclerotic. Degenerative changes of the spine and shoulder. Bones are osteopenic. No definite acute displaced rib fracture or focal rib abnormality. Old rib fractures present. No chest wall subcutaneous emphysema. IMPRESSION: Osteopenia and degenerative changes as above. No acute osseous finding by plain radiography Cardiomegaly with left lower lobe partial collapse/consolidation and stable left effusion. Electronically Signed   By: Jerilynn Mages.  Shick M.D.   On: 10/17/2018 12:54   Ct Head Wo Contrast  Result Date: 09/19/2018 CLINICAL DATA:  Fall yesterday with facial contusion and blurry vision EXAM: CT HEAD WITHOUT CONTRAST CT MAXILLOFACIAL WITHOUT CONTRAST CT CERVICAL SPINE WITHOUT CONTRAST TECHNIQUE: Multidetector CT imaging of the head, cervical spine, and maxillofacial structures were performed using the standard protocol without intravenous contrast. Multiplanar CT image reconstructions of the cervical spine and maxillofacial structures were also generated. COMPARISON:  04/06/2017 FINDINGS: CT HEAD FINDINGS Brain: Diffuse atrophic and mild chronic white matter ischemic changes are identified. No findings to suggest acute hemorrhage, acute infarction or space-occupying mass lesion are noted. Vascular: No hyperdense vessel or unexpected calcification. Skull: Normal. Negative for fracture or focal lesion. Other: None. CT  MAXILLOFACIAL FINDINGS Osseous: No acute fracture is identified. Orbits: Orbits and their contents show no acute abnormality. Sinuses: Paranasal sinuses are within normal limits. No mucosal abnormality is seen. Soft tissues: There is soft tissue swelling in the right forehead region extending to the supraorbital ridge. No other focal soft tissue abnormality is noted. CT CERVICAL SPINE FINDINGS Alignment: Mild loss of the normal cervical lordosis is noted. Skull base and vertebrae: 7 cervical segments are well visualized. Vertebral body height is well maintained. Multilevel facet hypertrophic changes and osteophytic changes are seen. No significant antero or retrolisthesis is noted. No acute fracture or acute facet abnormality is noted. Soft tissues and spinal canal: No acute soft tissue abnormality is noted. Upper chest: Bilateral large pleural effusions are noted. No focal infiltrate is seen. Other: None IMPRESSION: CT of the head: Mild atrophic and chronic white matter ischemic changes without acute abnormality. CT of  the maxillofacial bones: No acute bony abnormality. Soft tissue swelling in the right forehead region is noted. CT of the cervical spine: Multilevel degenerative change. No acute fracture is seen. Large bilateral pleural effusions. Electronically Signed   By: [name] M.D.   On: 09/19/2018 14:54   Ct Cervical Spine Wo Contrast  Result Date: 09/19/2018 CLINICAL DATA:  Fall yesterday with facial contusion and blurry vision EXAM: CT HEAD WITHOUT CONTRAST CT MAXILLOFACIAL WITHOUT CONTRAST CT CERVICAL SPINE WITHOUT CONTRAST TECHNIQUE: Multidetector CT imaging of the head, cervical spine, and maxillofacial structures were performed using the standard protocol without intravenous contrast. Multiplanar CT image reconstructions of the cervical spine and maxillofacial structures were also generated. COMPARISON:  04/06/2017 FINDINGS: CT HEAD FINDINGS Brain: Diffuse atrophic and mild chronic white matter  ischemic changes are identified. No findings to suggest acute hemorrhage, acute infarction or space-occupying mass lesion are noted. Vascular: No hyperdense vessel or unexpected calcification. Skull: Normal. Negative for fracture or focal lesion. Other: None. CT MAXILLOFACIAL FINDINGS Osseous: No acute fracture is identified. Orbits: Orbits and their contents show no acute abnormality. Sinuses: Paranasal sinuses are within normal limits. No mucosal abnormality is seen. Soft tissues: There is soft tissue swelling in the right forehead region extending to the supraorbital ridge. No other focal soft tissue abnormality is noted. CT CERVICAL SPINE FINDINGS Alignment: Mild loss of the normal cervical lordosis is noted. Skull base and vertebrae: 7 cervical segments are well visualized. Vertebral body height is well maintained. Multilevel facet hypertrophic changes and osteophytic changes are seen. No significant antero or retrolisthesis is noted. No acute fracture or acute facet abnormality is noted. Soft tissues and spinal canal: No acute soft tissue abnormality is noted. Upper chest: Bilateral large pleural effusions are noted. No focal infiltrate is seen. Other: None IMPRESSION: CT of the head: Mild atrophic and chronic white matter ischemic changes without acute abnormality. CT of the maxillofacial bones: No acute bony abnormality. Soft tissue swelling in the right forehead region is noted. CT of the cervical spine: Multilevel degenerative change. No acute fracture is seen. Large bilateral pleural effusions. Electronically Signed   By: [name] M.D.   On: 09/19/2018 14:54   Nm Pulmonary Vent And Perf (v/q Scan)  Result Date: 10/14/2018 CLINICAL DATA:  Abnormal CT EXAM: NUCLEAR MEDICINE VENTILATION - PERFUSION LUNG SCAN TECHNIQUE: Ventilation images were obtained in multiple projections using inhaled aerosol Tc-32m DTPA. Perfusion images were obtained in multiple projections after intravenous injection of  Tc-5m MAA. RADIOPHARMACEUTICALS:  29 mCi of Tc-70m DTPA aerosol inhalation and 4.1 mCi Tc69m MAA IV COMPARISON:  10/14/2018 CT FINDINGS: Ventilation: Heterogenous ventilation which limits evaluation for emboli. Large defect in the left lower lobe. Perfusion: Large left lower lobe perfusion defect. Corresponding radiographic airspace disease. IMPRESSION: Very limited study for evaluation of emboli due to significantly heterogeneous ventilation. Overall intermediate probability for pulmonary embolus, due to moderate left pleural effusion with large matched defect in the left lower lobe. Electronically Signed   By: Donavan Foil M.D.   On: 10/14/2018 17:34   Dg Hand Complete Right  Result Date: 09/19/2018 CLINICAL DATA:  Recent fall with hand pain, initial encounter EXAM: RIGHT HAND - COMPLETE 3+ VIEW COMPARISON:  None. FINDINGS: Mild degenerative changes of the interphalangeal joints are seen. Degenerative change of the first [facility] joint is noted as well. Radiocarpal degenerative changes are seen. No acute fracture or dislocation is noted. No radiopaque foreign body is seen. IMPRESSION: Chronic degenerative change without acute abnormality. Electronically Signed  By: [name] M.D.   On: 09/19/2018 14:06   Ct Renal Stone Study  Result Date: 10/14/2018 CLINICAL DATA:  Left lower quadrant pain for 2 days, history of recent fall, initial encounter EXAM: CT ABDOMEN AND PELVIS WITHOUT CONTRAST TECHNIQUE: Multidetector CT imaging of the abdomen and pelvis was performed following the standard protocol without IV contrast. COMPARISON:  06/15/2018 FINDINGS: Lower chest: Lung bases demonstrate bilateral pleural effusions. Associated consolidation is noted in the lower lobes. Calcified granuloma is noted in the right lower lobe. Additionally there is focal hypodensity identified within the left lower lobe pulmonary arterial branch. No contrast was administered for this exam although this raises suspicion for possible  pulmonary embolism. Coronary calcifications are noted as well. Hepatobiliary: No focal liver abnormality is seen. Status post cholecystectomy. No biliary dilatation. Pancreas: Unremarkable. No pancreatic ductal dilatation or surrounding inflammatory changes. Spleen: Normal in size without focal abnormality. Adrenals/Urinary Tract: Adrenal glands are within normal limits bilaterally. Kidneys are well visualized bilaterally with multiple cysts stable from the previous exam. Chronic left UPJ obstructive change is again noted and stable. Nonobstructing renal calculi are noted on the left new from the prior study. No ureteral stones are noted. The bladder is decompressed. Stomach/Bowel: Mild diverticular changes noted without evidence of diverticulitis. The appendix is well visualized extending into a right inguinal hernia. No obstructive changes are seen. The small bowel and stomach appear within normal limits. Vascular/Lymphatic: Aortic atherosclerosis. No enlarged abdominal or pelvic lymph nodes. Reproductive: Prostate is unremarkable. Other: Fat containing inguinal hernias are noted bilaterally. The right inguinal hernia again demonstrates a portion of the cecum and appendix within. No a car serration is noted. No ascites is seen. Musculoskeletal: Degenerative change without acute abnormality. IMPRESSION: Hypodensity within the left lower lobe pulmonary arterial branches suspicious for pulmonary embolism although no contrast was administered. CTA of the chest may be helpful for further evaluation. Bilateral pleural effusions and lower lobe consolidation/atelectasis. Diverticulosis without diverticulitis. Chronic changes in the kidneys as described with evidence of nonobstructing stones on the left. The largest of these measures approximately 5 mm. Fat containing inguinal hernias bilaterally. The appendix and a portion of the cecum are again noted within the right hernia although no incarceration is seen.  Electronically Signed   By: [name] M.D.   On: 10/14/2018 12:24   Ct Maxillofacial Wo Cm  Result Date: 09/19/2018 CLINICAL DATA:  Fall yesterday with facial contusion and blurry vision EXAM: CT HEAD WITHOUT CONTRAST CT MAXILLOFACIAL WITHOUT CONTRAST CT CERVICAL SPINE WITHOUT CONTRAST TECHNIQUE: Multidetector CT imaging of the head, cervical spine, and maxillofacial structures were performed using the standard protocol without intravenous contrast. Multiplanar CT image reconstructions of the cervical spine and maxillofacial structures were also generated. COMPARISON:  04/06/2017 FINDINGS: CT HEAD FINDINGS Brain: Diffuse atrophic and mild chronic white matter ischemic changes are identified. No findings to suggest acute hemorrhage, acute infarction or space-occupying mass lesion are noted. Vascular: No hyperdense vessel or unexpected calcification. Skull: Normal. Negative for fracture or focal lesion. Other: None. CT MAXILLOFACIAL FINDINGS Osseous: No acute fracture is identified. Orbits: Orbits and their contents show no acute abnormality. Sinuses: Paranasal sinuses are within normal limits. No mucosal abnormality is seen. Soft tissues: There is soft tissue swelling in the right forehead region extending to the supraorbital ridge. No other focal soft tissue abnormality is noted. CT CERVICAL SPINE FINDINGS Alignment: Mild loss of the normal cervical lordosis is noted. Skull base and vertebrae: 7 cervical segments are well visualized. Vertebral body height  is well maintained. Multilevel facet hypertrophic changes and osteophytic changes are seen. No significant antero or retrolisthesis is noted. No acute fracture or acute facet abnormality is noted. Soft tissues and spinal canal: No acute soft tissue abnormality is noted. Upper chest: Bilateral large pleural effusions are noted. No focal infiltrate is seen. Other: None IMPRESSION: CT of the head: Mild atrophic and chronic white matter ischemic changes without  acute abnormality. CT of the maxillofacial bones: No acute bony abnormality. Soft tissue swelling in the right forehead region is noted. CT of the cervical spine: Multilevel degenerative change. No acute fracture is seen. Large bilateral pleural effusions. Electronically Signed   By: [name] M.D.   On: 09/19/2018 14:54      Subjective: Some left flank pain. Easier to cough sputum.  Discharge Exam: Vitals:   10/17/18 1358 10/17/18 1359  BP: 95/65   Pulse: (!) 136 92  Resp: 20   Temp: 97.7 F (36.5 C)   SpO2: 95%    Vitals:   10/16/18 2139 10/17/18 0552 10/17/18 1358 10/17/18 1359  BP: (!) 118/97 (!) 118/92 95/65   Pulse: 85 95 (!) 136 92  Resp: 18 16 20    Temp: 98.2 F (36.8 C) 97.8 F (36.6 C) 97.7 F (36.5 C)   TempSrc: Oral Oral Oral   SpO2: 95% 94% 95%   Weight:      Height:        General: Pt is alert, awake, not in acute distress Cardiovascular: RRR, S1/S2 +, no rubs, no gallops Respiratory: CTA bilaterally, no wheezing, no rhonchi Abdominal: Soft, NT, ND, bowel sounds +. Left sided flank tenderness Extremities: 1+ LE edema, no cyanosis    The results of significant diagnostics from this hospitalization (including imaging, microbiology, ancillary and laboratory) are listed below for reference.     Microbiology: Recent Results (from the past 240 hour(s))  Urine culture     Status: Abnormal   Collection Time: 10/14/18 11:12 AM  Result Value Ref Range Status   Specimen Description   Final    URINE, RANDOM Performed at [facility] [address] [place], [place] [address]    Special Requests   Final    NONE Performed at [facility], [place] [address] [place], [place] [address]    Culture 30,000 COLONIES/mL PROTEUS MIRABILIS (A)  Final   Report Status 10/17/2018 FINAL  Final   Organism ID, Bacteria PROTEUS MIRABILIS (A)  Final      Susceptibility   Proteus mirabilis - MIC*    AMPICILLIN <=2 SENSITIVE Sensitive      CEFAZOLIN <=4 SENSITIVE Sensitive     CEFTRIAXONE <=1 SENSITIVE Sensitive     CIPROFLOXACIN <=0.25 SENSITIVE Sensitive     GENTAMICIN <=1 SENSITIVE Sensitive     IMIPENEM 4 SENSITIVE Sensitive     NITROFURANTOIN 128 RESISTANT Resistant     TRIMETH/SULFA <=20 SENSITIVE Sensitive     AMPICILLIN/SULBACTAM <=2 SENSITIVE Sensitive     PIP/TAZO <=4 SENSITIVE Sensitive     * 30,000 COLONIES/mL PROTEUS MIRABILIS  Culture, blood (routine x 2)     Status: None (Preliminary result)   Collection Time: 10/14/18  1:21 PM  Result Value Ref Range Status   Specimen Description   Final    LEFT ANTECUBITAL Performed at [facility] [address] [place], [place] [address]    Special Requests   Final    BOTTLES DRAWN AEROBIC AND ANAEROBIC Blood Culture adequate volume Performed at [facility]  [facility], [place] [address] [place], [place] [address]    Culture   Final    NO GROWTH 3 DAYS Performed at [facility], [place] [address], [place], [place] [address]    Report Status PENDING  Incomplete  Culture, blood (routine x 2)     Status: None (Preliminary result)   Collection Time: 10/14/18  3:50 PM  Result Value Ref Range Status   Specimen Description   Final    BLOOD RIGHT HAND Performed at [facility] [address], [place], [place] [address]    Special Requests   Final    BOTTLES DRAWN AEROBIC AND ANAEROBIC Blood Culture adequate volume Performed at [facility] [address], [place], [place] [address]    Culture   Final    NO GROWTH 3 DAYS Performed at [facility], [place] [address] [place], [place] [address]    Report Status PENDING  Incomplete     Labs: BNP (last 3 results) Recent Labs    03/30/18 1750  BNP 026.3*   Basic Metabolic Panel: Recent Labs  Lab 10/14/18 1031 10/15/18 0429  NA 140 141  K 4.4 4.4  CL 103 107  CO2 31 27  GLUCOSE 99 103*  BUN 34* 28*  CREATININE 1.66* 1.54*    CALCIUM 8.8* 8.1*   Liver Function Tests: Recent Labs  Lab 10/14/18 1031  AST 25  ALT 13  ALKPHOS 77  BILITOT 1.5*  PROT 7.3  ALBUMIN 3.9   Recent Labs  Lab 10/14/18 1031  LIPASE 32   No results for input(s): AMMONIA in the last 168 hours. CBC: Recent Labs  Lab 10/14/18 1031 10/15/18 0429  WBC 6.4 5.2  HGB 16.1 14.7  HCT 51.6 47.0  MCV 102.2* 99.2  PLT 143* 120*   Cardiac Enzymes: No results for input(s): CKTOTAL, CKMB, CKMBINDEX, TROPONINI in the last 168 hours. BNP: Invalid input(s): POCBNP CBG: No results for input(s): GLUCAP in the last 168 hours. D-Dimer No results for input(s): DDIMER in the last 72 hours. Hgb A1c No results for input(s): HGBA1C in the last 72 hours. Lipid Profile No results for input(s): CHOL, HDL, LDLCALC, TRIG, CHOLHDL, LDLDIRECT in the last 72 hours. Thyroid function studies No results for input(s): TSH, T4TOTAL, T3FREE, THYROIDAB in the last 72 hours.  Invalid input(s): FREET3 Anemia work up No results for input(s): VITAMINB12, FOLATE, FERRITIN, TIBC, IRON, RETICCTPCT in the last 72 hours. Urinalysis    Component Value Date/Time   COLORURINE AMBER (A) 10/14/2018 1112   APPEARANCEUR CLOUDY (A) 10/14/2018 1112   LABSPEC 1.013 10/14/2018 1112   PHURINE 6.0 10/14/2018 1112   GLUCOSEU NEGATIVE 10/14/2018 1112   GLUCOSEU NEGATIVE 10/23/2017 1245   HGBUR MODERATE (A) 10/14/2018 1112   BILIRUBINUR NEGATIVE 10/14/2018 1112   KETONESUR NEGATIVE 10/14/2018 1112   PROTEINUR 100 (A) 10/14/2018 1112   UROBILINOGEN 0.2 10/23/2017 1245   NITRITE NEGATIVE 10/14/2018 1112   LEUKOCYTESUR LARGE (A) 10/14/2018 1112   Sepsis Labs Invalid input(s): PROCALCITONIN,  WBC,  LACTICIDVEN Microbiology Recent Results (from the past 240 hour(s))  Urine culture     Status: Abnormal   Collection Time: 10/14/18 11:12 AM  Result Value Ref Range Status   Specimen Description   Final    URINE, RANDOM Performed at [facility], [place] [address] [place], [place] [address]    Special Requests   Final    NONE Performed at [facility], [place]  [address], [place], [place] [address]    Culture 30,000 COLONIES/mL PROTEUS MIRABILIS (A)  Final   Report Status 10/17/2018 FINAL  Final   Organism ID, Bacteria PROTEUS MIRABILIS (A)  Final      Susceptibility   Proteus mirabilis - MIC*    AMPICILLIN <=2 SENSITIVE Sensitive     CEFAZOLIN <=4 SENSITIVE Sensitive     CEFTRIAXONE <=1 SENSITIVE Sensitive     CIPROFLOXACIN <=0.25 SENSITIVE Sensitive     GENTAMICIN <=1 SENSITIVE Sensitive     IMIPENEM 4 SENSITIVE Sensitive     NITROFURANTOIN 128 RESISTANT Resistant     TRIMETH/SULFA <=20 SENSITIVE Sensitive     AMPICILLIN/SULBACTAM <=2 SENSITIVE Sensitive     PIP/TAZO <=4 SENSITIVE Sensitive     * 30,000 COLONIES/mL PROTEUS MIRABILIS  Culture, blood (routine x 2)     Status: None (Preliminary result)   Collection Time: 10/14/18  1:21 PM  Result Value Ref Range Status   Specimen Description   Final    LEFT ANTECUBITAL Performed at [facility] [address], [place], [place] [address]    Special Requests   Final    BOTTLES DRAWN AEROBIC AND ANAEROBIC Blood Culture adequate volume Performed at [place] [address], [place], [place] [address]    Culture   Final    NO GROWTH 3 DAYS Performed at [facility],  [place] [address], [place], [place] [address]    Report Status PENDING  Incomplete  Culture, blood (routine x 2)     Status: None (Preliminary result)   Collection Time: 10/14/18  3:50 PM  Result Value Ref Range Status   Specimen Description   Final    BLOOD RIGHT HAND Performed at [facility] [address], [place], [place] [address]    Special Requests   Final    BOTTLES DRAWN AEROBIC AND ANAEROBIC Blood Culture adequate volume Performed at [facility] [address], [place], [place] [address]    Culture    Final    NO GROWTH 3 DAYS Performed at [facility], [place] [address], [place], [place] [address]    Report Status PENDING  Incomplete      SIGNED:   Cordelia Poche, MD [facility] 10/17/2018, 4:06 PM

## 2018-10-17 NOTE — Progress Notes (Signed)
Report received from A. Dunkelberger, Therapist, sports. No change from initial pm assessment. Will continue to monitor and follow the POC.

## 2018-10-19 ENCOUNTER — Telehealth: Payer: Self-pay | Admitting: *Deleted

## 2018-10-19 LAB — CULTURE, BLOOD (ROUTINE X 2)
Culture: NO GROWTH
Culture: NO GROWTH
SPECIAL REQUESTS: ADEQUATE
SPECIAL REQUESTS: ADEQUATE

## 2018-10-19 NOTE — Telephone Encounter (Signed)
Transition Care Management Follow-up Telephone Call   Date discharged? 10/17/18   How have you been since you were released from the hospital? Pt states he is doing fine   Do you understand why you were in the hospital? YES   Do you understand the discharge instructions? YES   Where were you discharged to? Home   Items Reviewed:  Medications reviewed: YES  Allergies reviewed: YES  Dietary changes reviewed: YES, heart healthy  Referrals reviewed: No referral needed   Functional Questionnaire:   Activities of Daily Living (ADLs):   He states he are independent in the following: bathing and hygiene, feeding, continence, grooming, toileting and dressing States they require assistance with the following: ambulation   Any transportation issues/concerns?: NO   Any patient concerns? NO   Confirmed importance and date/time of follow-up visits scheduled YES, pt states his son made appt this am for this Thursday 10/22/18  Provider Appointment booked with Dr. Alain Marion  Confirmed with patient if condition begins to worsen call PCP or go to the ER.  Patient was given the office number and encouraged to call back with question or concerns.  : YES

## 2018-10-22 ENCOUNTER — Other Ambulatory Visit (INDEPENDENT_AMBULATORY_CARE_PROVIDER_SITE_OTHER): Payer: Medicare Other

## 2018-10-22 ENCOUNTER — Ambulatory Visit: Payer: Medicare Other | Admitting: Internal Medicine

## 2018-10-22 ENCOUNTER — Encounter: Payer: Self-pay | Admitting: Internal Medicine

## 2018-10-22 VITALS — BP 130/82 | HR 81 | Temp 98.2°F | Ht 68.0 in | Wt 177.0 lb

## 2018-10-22 DIAGNOSIS — N183 Chronic kidney disease, stage 3 unspecified: Secondary | ICD-10-CM

## 2018-10-22 DIAGNOSIS — J90 Pleural effusion, not elsewhere classified: Secondary | ICD-10-CM | POA: Diagnosis not present

## 2018-10-22 DIAGNOSIS — I2510 Atherosclerotic heart disease of native coronary artery without angina pectoris: Secondary | ICD-10-CM | POA: Diagnosis not present

## 2018-10-22 DIAGNOSIS — I10 Essential (primary) hypertension: Secondary | ICD-10-CM

## 2018-10-22 DIAGNOSIS — I5022 Chronic systolic (congestive) heart failure: Secondary | ICD-10-CM | POA: Diagnosis not present

## 2018-10-22 LAB — CBC WITH DIFFERENTIAL/PLATELET
Basophils Absolute: 0 10*3/uL (ref 0.0–0.1)
Basophils Relative: 0.9 % (ref 0.0–3.0)
Eosinophils Absolute: 0.1 10*3/uL (ref 0.0–0.7)
Eosinophils Relative: 3.1 % (ref 0.0–5.0)
HCT: 50 % (ref 39.0–52.0)
Hemoglobin: 16.6 g/dL (ref 13.0–17.0)
Lymphocytes Relative: 18.4 % (ref 12.0–46.0)
Lymphs Abs: 0.9 10*3/uL (ref 0.7–4.0)
MCHC: 33.2 g/dL (ref 30.0–36.0)
MCV: 95.9 fl (ref 78.0–100.0)
Monocytes Absolute: 0.6 10*3/uL (ref 0.1–1.0)
Monocytes Relative: 11.5 % (ref 3.0–12.0)
Neutro Abs: 3.2 10*3/uL (ref 1.4–7.7)
Neutrophils Relative %: 66.1 % (ref 43.0–77.0)
Platelets: 164 10*3/uL (ref 150.0–400.0)
RBC: 5.21 Mil/uL (ref 4.22–5.81)
RDW: 15.1 % (ref 11.5–15.5)
WBC: 4.8 10*3/uL (ref 4.0–10.5)

## 2018-10-22 LAB — BASIC METABOLIC PANEL
BUN: 40 mg/dL — ABNORMAL HIGH (ref 6–23)
CO2: 27 mEq/L (ref 19–32)
Calcium: 9.1 mg/dL (ref 8.4–10.5)
Chloride: 103 mEq/L (ref 96–112)
Creatinine, Ser: 1.57 mg/dL — ABNORMAL HIGH (ref 0.40–1.50)
GFR: 43.68 mL/min — ABNORMAL LOW (ref >60.00)
Glucose, Bld: 119 mg/dL — ABNORMAL HIGH (ref 70–99)
Potassium: 4 mEq/L (ref 3.5–5.1)
Sodium: 139 mEq/L (ref 135–145)

## 2018-10-22 NOTE — Assessment & Plan Note (Addendum)
CXR Demadex po

## 2018-10-22 NOTE — Progress Notes (Signed)
Established Patient Office Visit  Subjective:  Patient ID: [name] [name], male    DOB: 1920/01/31  Age: 84 y.o. MRN: [id number]  CC: No chief complaint on file.   HPI [name] presents for post-hosp f/u. He was admitted on 11/27 for L CP. D/c reviewed.  Per hx: "[name] [name] a 84 y.o.malewith medical history significant forCHF with reduced EF (20%, 03/2018), chronic atrial fibrillation on Eliquiswho presents on 11/27/2019with left-sided abdominal pain x2 days.   Several weeks agohe reports fallingonto the concrete floor of his carport after tripping on a limb in his yard. He was later evaluated at [facility] ED on 11/2 for laceration of his right hand and ecchymosis of his right eyeassociated with that fall. He was prescribeda short course of Keflex which she has since completed.Marland Kitchen  He now reportsforthe last two days hehasnoticed left sided abdominal pain, that was a stabbing sharpness like a knife. He thought it was because of his priorfall from but he landed on his right side. Coughing makes the pain worse, no obvious alleviating factors. Denies any nausea, emesis, fevers or chills. No recent sick contacts. No diarrhea.  Regarding his CHF. His PCP changed his lasix to torsemide based off large bilateral pleural effusions noted on CT cervical spine imaging from his recent ED visit(11/2). He denies any worseninglower legswelling, PND,ororthopnea  No recent travel history, no sick contacts, no family history of blood clots   ED Course:In the ED he had a temp of 97.3,Respiratory rate 22, otherwise him dynamically stable. Lactic acid was within normal limits. Lipase is unremarkable. Blood cultures x2 were obtained. UA significant for large leukocytes, greater than 50 WBC, greater than 50 RBC, moderate hemoglobin, no bacteria seen. CMP and CBC showed no acute abnormalities  CT renal stone study showed nonobstructive stones in the left side (approximately  5 mm). Also noted bilateral pleural effusions, lower lobe consolidation/atelectasis, and left lower lobe pulmonary to arrange hypodensity concerning for potential PE (no contrast was administered so difficult to evaluate)   Hospital course:  UTI CT scan without evidence of pyelonephritis.Urine culture significant for 30K colonies of proteus mirabilis. Patient empirically treated with ceftriaxone and transitioned to amoxicillin.  Hypodensity in pulmonary artery vasculature V/Q scan indeterminate for PE in setting of bilateral pleural effusions.  Left sided flank pain Appears musculoskeletal. Voltaren gel and Tramadol.  Left lung consolidation/collapse Discussed with pulmonology. Chronic. Recommend incentive spirometry and repeat chest x-ray in 5-7 days.  Chronic atrial fibrillation Rate controlled. Continued amiodarone, diltiazem, Eliquis  Chronic systolic heart failure EF of 20%. Euvolemic mostly with mild LE edema and stable pleural effusions. Continued Demadex.  BPH Likely contributing to possible UTI. Continued finasteride  Hyperlipidemia Continued pravastatin  CKD stage III Baseline.  Cough New. Dry cough. Weak cough on exam. Patient states that he has choked one time on his food but generally does not cough much with eating. Speech therapy evaluated and hand no recommendations.  Constipation Patient given dulcolax suppository, soap suds and SMOG enema with only small amounts of stool.  Discharge Diagnoses:  Active Problems:   BPH (benign prostatic hyperplasia)   CKD (chronic kidney disease) stage 3, GFR 30-59 ml/min (HCC)   Congestive dilated cardiomyopathy (HCC)   Chronic anticoagulation   Chronic atrial fibrillation   Chronic combined systolic and diastolic CHF (congestive heart failure) ([place])   Acute pyelonephritis"    Past Medical History:  Diagnosis Date  . Anxiety   . Arthritis    "shoulders" (05/28/2016)  . Cardiomyopathy   .  Coronary artery disease   . Depression    hx (05/28/2016)  . Hemorrhoids   . Hyperlipidemia   . Hypertension   . Paroxysmal atrial fibrillation (HCC)   . Skipped heart beats     Past Surgical History:  Procedure Laterality Date  . APPENDECTOMY    . CARDIOVERSION N/A 06/04/2016   Procedure: CARDIOVERSION;  Surgeon: Pixie Casino, MD;  Location: [facility] ENDOSCOPY;  Service: Cardiovascular;  Laterality: N/A;  . CARDIOVERSION N/A 04/09/2017   Procedure: CARDIOVERSION;  Surgeon: Thayer Headings, MD;  Location: [facility] ENDOSCOPY;  Service: Cardiovascular;  Laterality: N/A;  . CARDIOVERSION N/A 04/01/2018   Procedure: CARDIOVERSION;  Surgeon: Skeet Latch, MD;  Location: [place];  Service: Cardiovascular;  Laterality: N/A;  . CATARACT EXTRACTION Left   . CHOLECYSTECTOMY OPEN    . COLONOSCOPY W/ BIOPSIES AND POLYPECTOMY    . INGUINAL HERNIA REPAIR Bilateral   . MYRINGOTOMY WITH TUBE PLACEMENT Bilateral   . skin cancer removal Right    side of nose by Rt eye  . TEE WITHOUT CARDIOVERSION N/A 06/04/2016   Procedure: TRANSESOPHAGEAL ECHOCARDIOGRAM (TEE);  Surgeon: Pixie Casino, MD;  Location: [facility] ENDOSCOPY;  Service: Cardiovascular;  Laterality: N/A;  . TEE WITHOUT CARDIOVERSION N/A 04/01/2018   Procedure: TRANSESOPHAGEAL ECHOCARDIOGRAM (TEE);  Surgeon: Skeet Latch, MD;  Location: [place];  Service: Cardiovascular;  Laterality: N/A;  . TONSILLECTOMY AND ADENOIDECTOMY      Family History  Problem Relation Age of Onset  . Heart disease Brother     Social History   Socioeconomic History  . Marital status: Widowed    Spouse name: Not on file  . Number of children: 1  . Years of education: Not on file  . Highest education level: Not on file  Occupational History  . Occupation: Retired  Scientific laboratory technician  . Financial resource strain: Not on file  . Food insecurity:    Worry: Not on file    Inability: Not on file  . Transportation needs:    Medical: Not on file     Non-medical: Not on file  Tobacco Use  . Smoking status: Never Smoker  . Smokeless tobacco: Never Used  Substance and Sexual Activity  . Alcohol use: No  . Drug use: No  . Sexual activity: Yes  Lifestyle  . Physical activity:    Days per week: Not on file    Minutes per session: Not on file  . Stress: Not on file  Relationships  . Social connections:    Talks on phone: Not on file    Gets together: Not on file    Attends religious service: Not on file    Active member of club or organization: Not on file    Attends meetings of clubs or organizations: Not on file    Relationship status: Not on file  . Intimate partner violence:    Fear of current or ex partner: Not on file    Emotionally abused: Not on file    Physically abused: Not on file    Forced sexual activity: Not on file  Other Topics Concern  . Not on file  Social History Narrative  . Not on file    Outpatient Medications Prior to Visit  Medication Sig Dispense Refill  . ALPRAZolam (XANAX) 1 MG tablet Take 1 tablet (1 mg total) by mouth at bedtime. 90 tablet 2  . amiodarone (PACERONE) 200 MG tablet TAKE 1 TABLET ONCE DAILY. (Patient taking differently: Take 200 mg by mouth daily. )  30 tablet 5  . amoxicillin (AMOXIL) 500 MG capsule Take 1 capsule (500 mg total) by mouth every 12 (twelve) hours for 7 days. 14 capsule 0  . apixaban (ELIQUIS) 2.5 MG TABS tablet Take 1 tablet (2.5 mg total) by mouth 2 (two) times daily. 60 tablet 11  . diclofenac sodium (VOLTAREN) 1 % GEL Apply 2 g topically 4 (four) times daily. 1 Tube 0  . diltiazem (CARDIZEM) 30 MG tablet Take 1 tablet (30 mg total) by mouth 2 (two) times daily. (Patient taking differently: Take 30 mg by mouth daily. ) 180 tablet 3  . finasteride (PROSCAR) 5 MG tablet Take 1 tablet (5 mg total) by mouth daily. 90 tablet 3  . guaiFENesin (MUCINEX) 600 MG 12 hr tablet Take 2 tablets (1,200 mg total) by mouth 2 (two) times daily for 5 days. 20 tablet 0  . linaclotide  (LINZESS) 290 MCG CAPS capsule Take 1 capsule (290 mcg total) by mouth daily before breakfast. 90 capsule 3  . polyethylene glycol (MIRALAX / GLYCOLAX) packet Take 17 g by mouth at bedtime.    . pravastatin (PRAVACHOL) 20 MG tablet TAKE 1 TABLET EACH DAY. (Patient taking differently: Take 20 mg by mouth daily. ) 30 tablet 11  . senna (SENOKOT) 8.6 MG TABS tablet Take 1-2 tablets (8.6-17.2 mg total) by mouth at bedtime. (Patient taking differently: Take 1-2 tablets by mouth at bedtime as needed for mild constipation. ) 120 each 11  . torsemide (DEMADEX) 10 MG tablet Take 1 tablet (10 mg total) by mouth daily. 30 tablet 11  . traMADol (ULTRAM) 50 MG tablet Take 1 tablet (50 mg total) by mouth every 12 (twelve) hours as needed for moderate pain or severe pain. 10 tablet 0  . VENTOLIN HFA 108 (90 Base) MCG/ACT inhaler Inhale 2 puffs into the lungs every 6 (six) hours as needed for wheezing or shortness of breath.   0   No facility-administered medications prior to visit.     Allergies  Allergen Reactions  . Lisinopril Cough    ROS Review of Systems  Constitutional: Negative for appetite change, fatigue and unexpected weight change.  HENT: Negative for congestion, nosebleeds, sneezing, sore throat and trouble swallowing.   Eyes: Negative for itching and visual disturbance.  Respiratory: Negative for cough, shortness of breath and wheezing.   Cardiovascular: Negative for chest pain, palpitations and leg swelling.  Gastrointestinal: Negative for abdominal distention, blood in stool, diarrhea and nausea.  Genitourinary: Negative for frequency and hematuria.  Musculoskeletal: Positive for arthralgias and gait problem. Negative for back pain, joint swelling and neck pain.  Skin: Negative for rash.  Neurological: Negative for dizziness, tremors, speech difficulty and weakness.  Hematological: Bruises/bleeds easily.  Psychiatric/Behavioral: Negative for agitation, dysphoric mood, sleep disturbance  and suicidal ideas. The patient is not nervous/anxious.       Objective:    Physical Exam  Constitutional: He is oriented to person, place, and time. He appears well-developed. No distress.  NAD  HENT:  Mouth/Throat: Oropharynx is clear and moist.  Eyes: Pupils are equal, round, and reactive to light. Conjunctivae are normal.  Neck: Normal range of motion. No JVD present. No thyromegaly present.  Cardiovascular: Normal rate, regular rhythm, normal heart sounds and intact distal pulses. Exam reveals no gallop and no friction rub.  No murmur heard. Pulmonary/Chest: Effort normal and breath sounds normal. No respiratory distress. He has no wheezes. He has no rales. He exhibits no tenderness.  Abdominal: Soft. Bowel sounds are normal. He  exhibits no distension and no mass. There is no tenderness. There is no rebound and no guarding.  Musculoskeletal: Normal range of motion. He exhibits tenderness. He exhibits no edema.  Lymphadenopathy:    He has no cervical adenopathy.  Neurological: He is alert and oriented to person, place, and time. He has normal reflexes. No cranial nerve deficit. He exhibits normal muscle tone. He displays a negative Romberg sign. Coordination abnormal. Gait normal.  Skin: Skin is warm and dry. No rash noted.  Psychiatric: He has a normal mood and affect. His behavior is normal. Judgment and thought content normal.  cane Bruises decr B BS at bases  BP 130/82 (BP Location: Left Arm, Patient Position: Sitting, Cuff Size: Normal)   Pulse 81   Temp 98.2 F (36.8 C) (Oral)   Ht 5\' 8"  (1.727 m)   Wt 177 lb (80.3 kg)   SpO2 95%   BMI 26.91 kg/m  Wt Readings from Last 3 Encounters:  10/22/18 177 lb (80.3 kg)  10/16/18 175 lb 7.8 oz (79.6 kg)  09/30/18 176 lb (79.8 kg)     There are no preventive care reminders to display for this patient.  There are no preventive care reminders to display for this patient.  Lab Results  Component Value Date   TSH 1.681  03/30/2018   Lab Results  Component Value Date   WBC 5.2 10/15/2018   HGB 14.7 10/15/2018   HCT 47.0 10/15/2018   MCV 99.2 10/15/2018   PLT 120 (L) 10/15/2018   Lab Results  Component Value Date   NA 141 10/15/2018   K 4.4 10/15/2018   CO2 27 10/15/2018   GLUCOSE 103 (H) 10/15/2018   BUN 28 (H) 10/15/2018   CREATININE 1.54 (H) 10/15/2018   BILITOT 1.5 (H) 10/14/2018   ALKPHOS 77 10/14/2018   AST 25 10/14/2018   ALT 13 10/14/2018   PROT 7.3 10/14/2018   ALBUMIN 3.9 10/14/2018   CALCIUM 8.1 (L) 10/15/2018   ANIONGAP 7 10/15/2018   GFR 41.59 (L) 03/25/2018   Lab Results  Component Value Date   CHOL 112 03/30/2018   Lab Results  Component Value Date   HDL 39 (L) 03/30/2018   Lab Results  Component Value Date   LDLCALC 57 03/30/2018   Lab Results  Component Value Date   TRIG 80 03/30/2018   Lab Results  Component Value Date   CHOLHDL 2.9 03/30/2018   Lab Results  Component Value Date   HGBA1C 5.7 (H) 03/31/2018      Assessment & Plan:   Problem List Items Addressed This Visit    None      No orders of the defined types were placed in this encounter.   Follow-up: No follow-ups on file.    Walker Kehr, MD

## 2018-10-22 NOTE — Assessment & Plan Note (Signed)
Eliquis, Pravastatin 

## 2018-10-22 NOTE — Assessment & Plan Note (Signed)
BMET 

## 2018-10-22 NOTE — Assessment & Plan Note (Signed)
Toprol, Furosemide 

## 2018-10-22 NOTE — Assessment & Plan Note (Addendum)
CXR if worse Labs Demadex po

## 2018-11-03 ENCOUNTER — Ambulatory Visit: Payer: Medicare Other | Admitting: Cardiology

## 2018-11-05 ENCOUNTER — Ambulatory Visit: Payer: Medicare Other | Admitting: Internal Medicine

## 2018-11-09 ENCOUNTER — Telehealth: Payer: Self-pay | Admitting: Internal Medicine

## 2018-11-09 NOTE — Telephone Encounter (Signed)
Pt has been having diarrhea and has also taken several different antibiotics recently.

## 2018-11-09 NOTE — Telephone Encounter (Signed)
Discussed with NP that there are no appts available sooner than what he is scheduled. Added to the wait list. Let her know that pt could see PCP or urgent care if needs to be seen sooner than scheduled appt.

## 2018-11-09 NOTE — Telephone Encounter (Signed)
Called and spoke with pt and let him know to call his PCP to be seen sooner than his scheduled GI appt, pt verbalized understanding.

## 2018-11-12 ENCOUNTER — Ambulatory Visit (INDEPENDENT_AMBULATORY_CARE_PROVIDER_SITE_OTHER): Payer: Medicare Other | Admitting: Internal Medicine

## 2018-11-12 ENCOUNTER — Encounter: Payer: Self-pay | Admitting: Internal Medicine

## 2018-11-12 DIAGNOSIS — K5900 Constipation, unspecified: Secondary | ICD-10-CM

## 2018-11-12 MED ORDER — HYDROCORTISONE 2.5 % RE CREA: 1.0000 "application " | TOPICAL_CREAM | Freq: Two times a day (BID) | RECTAL | 0 refills | Status: DC

## 2018-11-12 NOTE — Patient Instructions (Signed)
We will have you change linzess to every other day.   Work on drinking 6 glasses of liquids per day of any kind.  Add more beans to your diet to help with bowels as well as fruits and vegetables. Limit meats.   Keep a journal of what medicines and how much you take of the constipation medicines (senokot, miralax, linzess) and how many bowel movements you have and return to Dr. Alain Marion to see if he can help you spot a pattern to get the doses right.   High-Fiber Diet Fiber, also called dietary fiber, is a type of carbohydrate that is found in fruits, vegetables, whole grains, and beans. A high-fiber diet can have many health benefits. Your health care provider may recommend a high-fiber diet to help:  Prevent constipation. Fiber can make your bowel movements more regular.  Lower your cholesterol.  Relieve the following conditions: ? Swelling of veins in the anus (hemorrhoids). ? Swelling and irritation (inflammation) of specific areas of the digestive tract (uncomplicated diverticulosis). ? A problem of the large intestine (colon) that sometimes causes pain and diarrhea (irritable bowel syndrome, IBS).  Prevent overeating as part of a weight-loss plan.  Prevent heart disease, type 2 diabetes, and certain cancers. What is my plan? The recommended daily fiber intake in grams (g) includes:  38 g for men age 31 or younger.  30 g for men over age 41.  2 g for women age 41 or younger.  21 g for women over age 44. You can get the recommended daily intake of dietary fiber by:  Eating a variety of fruits, vegetables, grains, and beans.  Taking a fiber supplement, if it is not possible to get enough fiber through your diet. What do I need to know about a high-fiber diet?  It is better to get fiber through food sources rather than from fiber supplements. There is not a lot of research about how effective supplements are.  Always check the fiber content on the nutrition facts label of  any prepackaged food. Look for foods that contain 5 g of fiber or more per serving.  Talk with a diet and nutrition specialist (dietitian) if you have questions about specific foods that are recommended or not recommended for your medical condition, especially if those foods are not listed below.  Gradually increase how much fiber you consume. If you increase your intake of dietary fiber too quickly, you may have bloating, cramping, or gas.  Drink plenty of water. Water helps you to digest fiber. What are tips for following this plan?  Eat a wide variety of high-fiber foods.  Make sure that half of the grains that you eat each day are whole grains.  Eat breads and cereals that are made with whole-grain flour instead of refined flour or white flour.  Eat brown rice, bulgur wheat, or millet instead of white rice.  Start the day with a breakfast that is high in fiber, such as a cereal that contains 5 g of fiber or more per serving.  Use beans in place of meat in soups, salads, and pasta dishes.  Eat high-fiber snacks, such as berries, raw vegetables, nuts, and popcorn.  Choose whole fruits and vegetables instead of processed forms like juice or sauce. What foods can I eat?  Fruits Berries. Pears. Apples. Oranges. Avocado. Prunes and raisins. Dried figs. Vegetables Sweet potatoes. Spinach. Kale. Artichokes. Cabbage. Broccoli. Cauliflower. Green peas. Carrots. Squash. Grains Whole-grain breads. Multigrain cereal. Oats and oatmeal. Brown rice. Barley.  Bulgur wheat. [place]. Quinoa. Bran muffins. Popcorn. Rye wafer crackers. Meats and other proteins Navy, kidney, and pinto beans. Soybeans. Split peas. Lentils. Nuts and seeds. Dairy Fiber-fortified yogurt. Beverages Fiber-fortified soy milk. Fiber-fortified orange juice. Other foods Fiber bars. The items listed above may not be a complete list of recommended foods and beverages. Contact a dietitian for more options. What foods are not  recommended? Fruits Fruit juice. Cooked, strained fruit. Vegetables Fried potatoes. Canned vegetables. Well-cooked vegetables. Grains White bread. Pasta made with refined flour. White rice. Meats and other proteins Fatty cuts of meat. Fried chicken or fried fish. Dairy Milk. Yogurt. Cream cheese. Sour cream. Fats and oils Butters. Beverages Soft drinks. Other foods Cakes and pastries. The items listed above may not be a complete list of foods and beverages to avoid. Contact a dietitian for more information. Summary  Fiber is a type of carbohydrate. It is found in fruits, vegetables, whole grains, and beans.  There are many health benefits of eating a high-fiber diet, such as preventing constipation, lowering blood cholesterol, helping with weight loss, and reducing your risk of heart disease, diabetes, and certain cancers.  Gradually increase your intake of fiber. Increasing too fast can result in cramping, bloating, and gas. Drink plenty of water while you increase your fiber.  The best sources of fiber include whole fruits and vegetables, whole grains, nuts, seeds, and beans. This information is not intended to replace advice given to you by your health care provider. Make sure you discuss any questions you have with your health care provider. Document Released: 11/04/2005 Document Revised: 09/08/2017 Document Reviewed: 09/08/2017 Elsevier Interactive Patient Education  2019 [employer].

## 2018-11-12 NOTE — Progress Notes (Signed)
   Subjective:   Patient ID: [name] [name], male    DOB: [birth year], 84 y.o.   MRN: [id number]  HPI The patient is a 84 YO man coming in for diarrhea/constipation. He is taking several medications for constipation and they only work intermittently. Then he gets diarrhea for some time until he starts getting constipated again. He is currently taking linzess 290, senokot 1 daily, and miralax amount unknown. He did have 7-8 diarrhea last night so skipped the linzess. None yet today. Denies blood in diarrhea. This is a chronic problem but worsening over the years. No sudden change in the last several weeks but he is very tired of it. Drinks maybe 3-4 glasses of liquids per day. Not much exercise. Does eat some vegetables, not much beans, not much whole grains. Still passing gas. Abdomen not tender today.   Review of Systems  Constitutional: Negative.   HENT: Negative.   Eyes: Negative.   Respiratory: Negative for cough, chest tightness and shortness of breath.   Cardiovascular: Negative for chest pain, palpitations and leg swelling.  Gastrointestinal: Positive for constipation and diarrhea. Negative for abdominal distention, abdominal pain, nausea and vomiting.  Musculoskeletal: Negative.   Skin: Negative.   Neurological: Negative.   Psychiatric/Behavioral: Negative.     Objective:  Physical Exam Constitutional:      Appearance: He is well-developed.  HENT:     Head: Normocephalic and atraumatic.  Neck:     Musculoskeletal: Normal range of motion.  Cardiovascular:     Rate and Rhythm: Normal rate and regular rhythm.  Pulmonary:     Effort: Pulmonary effort is normal. No respiratory distress.     Breath sounds: Normal breath sounds. No wheezing or rales.  Abdominal:     General: Bowel sounds are normal. There is no distension.     Palpations: Abdomen is soft.     Tenderness: There is no abdominal tenderness. There is no rebound.  Skin:    General: Skin is warm and dry.  Neurological:       Mental Status: He is alert and oriented to person, place, and time.     Coordination: Coordination normal.    Vitals:   11/12/18 1450  BP: 110/80  Pulse: 77  Temp: 97.6 F (36.4 C)  TempSrc: Oral  SpO2: 98%  Weight: 180 lb (81.6 kg)  Height: 5\' 8"  (1.727 m)    Assessment & Plan:  Visit time 25 minutes: greater than 50% of that time was spent in face to face counseling and coordination of care with the patient: counseled about the nature of constipation and the various treatments including diet, liquid intake, exercise, medications including rx and otc.

## 2018-11-13 NOTE — Assessment & Plan Note (Signed)
Rx for anusol for hemorrhoids. Advised to do linzess every other day as he is having diarrhea. Keep journal of what meds and how often BM to help track and adjust. Using senokot 1 daily and some miralax but he does not know how much. No blood in stools or pain to suggest bleeding or infection. Offered other prescription options but he just got 90 day fill of linzess and wants to work with that if possible to try.

## 2018-11-19 ENCOUNTER — Encounter

## 2018-11-19 ENCOUNTER — Ambulatory Visit: Payer: Medicare Other | Admitting: Physician Assistant

## 2018-11-19 ENCOUNTER — Ambulatory Visit (INDEPENDENT_AMBULATORY_CARE_PROVIDER_SITE_OTHER)
Admission: RE | Admit: 2018-11-19 | Discharge: 2018-11-19 | Disposition: A | Discharge status: Home / Self Care | Payer: Medicare Other | Source: Ambulatory Visit | Attending: Physician Assistant | Admitting: Physician Assistant

## 2018-11-19 ENCOUNTER — Encounter: Payer: Self-pay | Admitting: Physician Assistant

## 2018-11-19 VITALS — BP 122/80 | HR 72 | Ht 68.0 in | Wt 179.0 lb

## 2018-11-19 DIAGNOSIS — K5900 Constipation, unspecified: Secondary | ICD-10-CM | POA: Diagnosis not present

## 2018-11-19 DIAGNOSIS — K5909 Other constipation: Secondary | ICD-10-CM

## 2018-11-19 DIAGNOSIS — R194 Change in bowel habit: Secondary | ICD-10-CM | POA: Diagnosis not present

## 2018-11-19 MED ORDER — LUBIPROSTONE 24 MCG PO CAPS: ORAL_CAPSULE | ORAL | 3 refills | Status: DC

## 2018-11-19 NOTE — Patient Instructions (Signed)
Your provider has requested that you go to the basement level for the radiology department for an abdominal X-Ray  before leaving today. Press "B" on the elevator. The lab is located at the first door on the left as you exit the elevator. We will call you with the results.  We have given you samples of Amitiza 24 mcg. Take 1 capsule twice daily.  We also sent a prescription.   Do a Miralax purge either today or tomorrow.  Take 1 dose, 17 grams in 8 oz of water every 20-to 30 minutes. Do 8 doses.   Normal BMI (Body Mass Index- based on height and weight) is between 23 and 30. Your BMI today is Body mass index is 27.22 kg/m. Marland Kitchen Please consider follow up  regarding your BMI with your Primary Care Provider.
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
## 2018-11-20 ENCOUNTER — Telehealth: Payer: Self-pay | Admitting: *Deleted

## 2018-11-20 ENCOUNTER — Telehealth: Payer: Self-pay | Admitting: Physician Assistant

## 2018-11-20 NOTE — Telephone Encounter (Signed)
I called to iniate a prior authorization with [facility] HMO.  Spoke to [place] at [phone].  I answered a few clinical questions and they will fax their response or call us with their answer as to whether the Amitiza 24 mcg was approved or not

## 2018-11-20 NOTE — Telephone Encounter (Signed)
Pt was seen 11/19/18 w Amy Trellis Paganini.  Margaretmary Bayley from [facility] called to enquire about pt's Miralax purge.  Please CB. [phone]

## 2018-11-23 NOTE — Telephone Encounter (Signed)
Called [name] [name] from [employer] back. This is a nursing service that checks on [name] [name].  He apparently did the miralax purge on Friday the 3rd. He didn't exactly follow the instructions. He told [name] [name] he had to driink 8 glasses of water with each dose.   [name] [name] didn't know how much miralax he took but he had very loose stools 4-5 times  And messed his clothes and had to change clothes many times. He felt weak.  Olegario Shearer is calling him today to check on him.  I told her I advised him to call over the weekend for the Doc on call if needed.  He chose not to do that.

## 2018-11-30 ENCOUNTER — Telehealth: Payer: Self-pay | Admitting: Physician Assistant

## 2018-11-30 ENCOUNTER — Other Ambulatory Visit: Payer: Self-pay | Admitting: Internal Medicine

## 2018-11-30 NOTE — Telephone Encounter (Signed)
Pt called to inform you that he is not doing well on his medication that was prescribed by Nicoletta Ba.  He is having bathroom issues.  Please return call.

## 2018-11-30 NOTE — Telephone Encounter (Signed)
"  [name], NP" calls. She reports the patient is having diarrhea now instead of the constipation. He is taking Miralax, Senekot, Linzess and Amitiza.  She states she is the nurse practitioner that makes home visits. She wants to know what Nicoletta Ba, PA would like the patient to do.

## 2018-12-01 IMAGING — CR DG KNEE COMPLETE 4+V*L*
4 series · 4 of 4 positions shown · non-contrast
Comparison: None.

CLINICAL DATA: Worsening left knee pain.

EXAM:
LEFT KNEE - COMPLETE 4+ VIEW

[x knee ap left (1 of 3)]
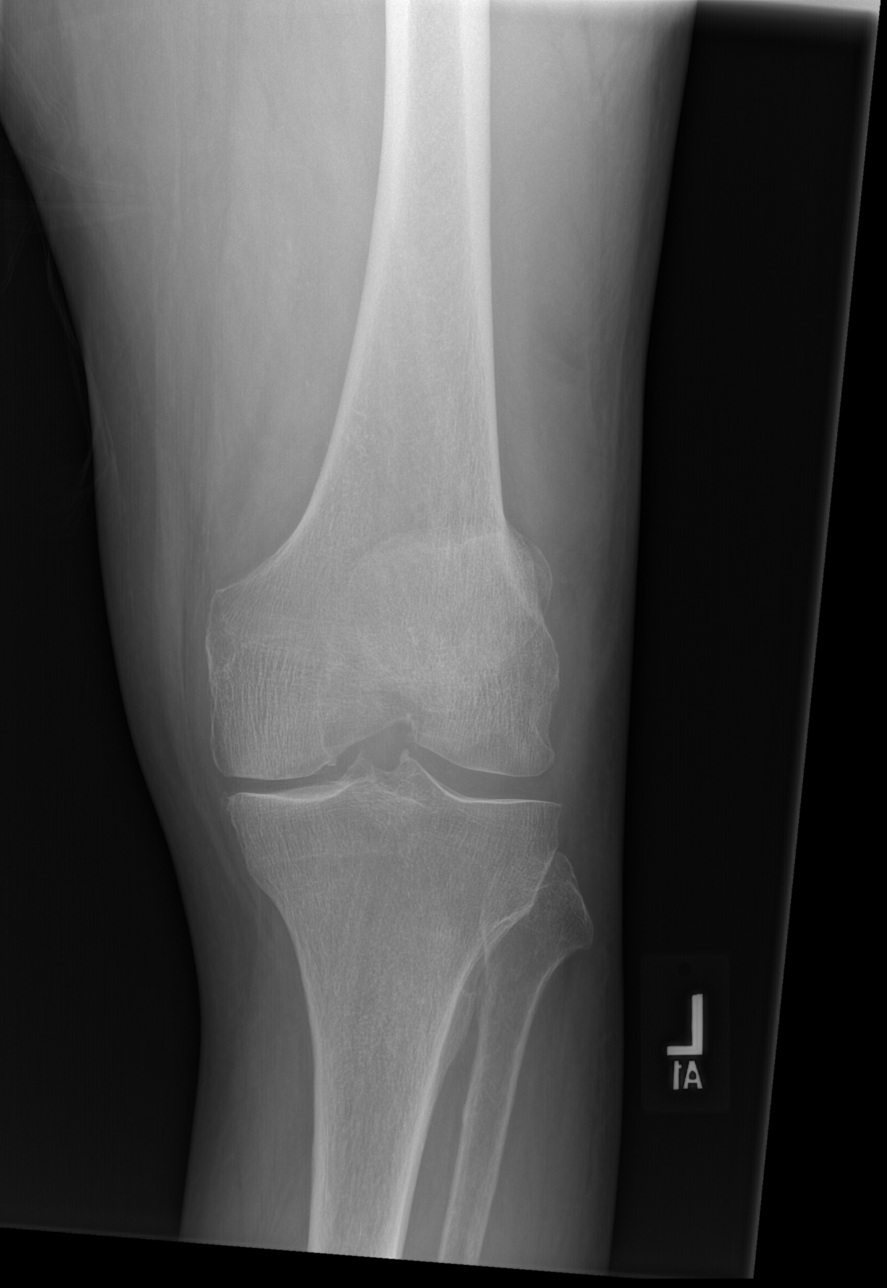

[x knee ap left (2 of 3)]
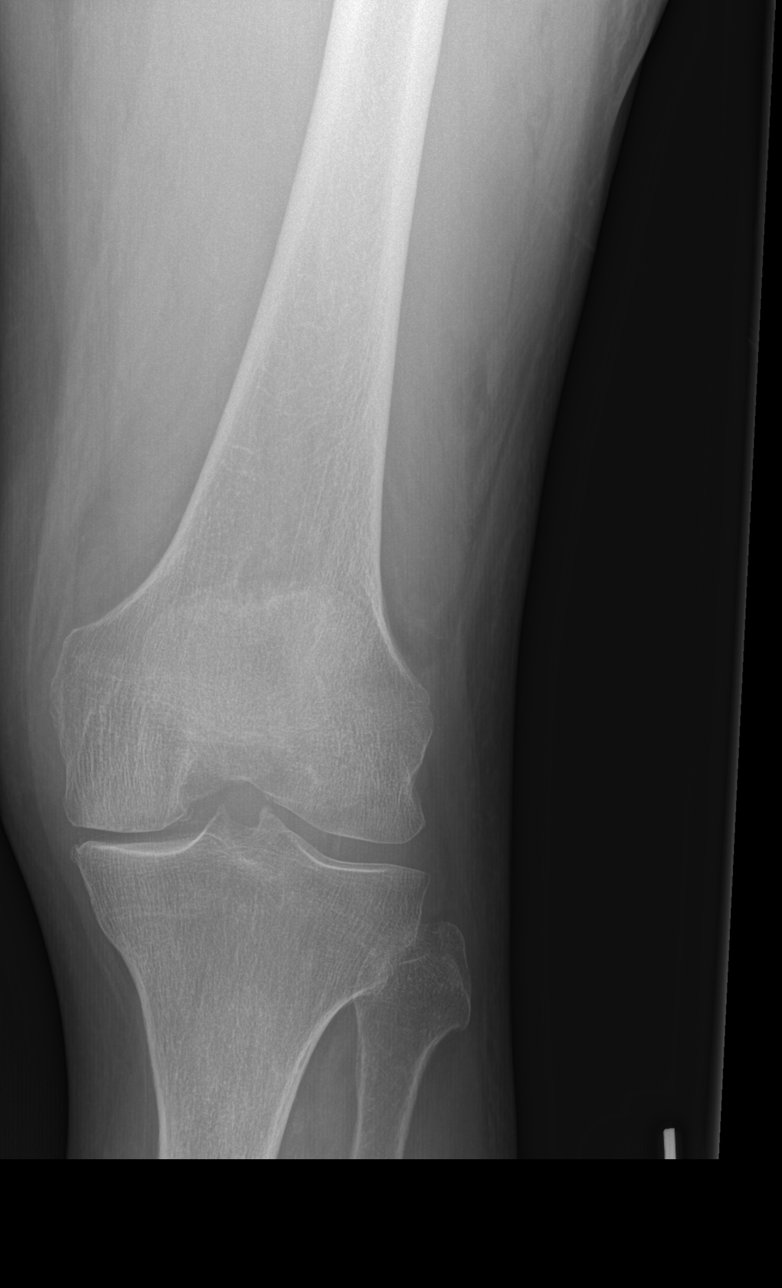

[x knee ap left (3 of 3)]
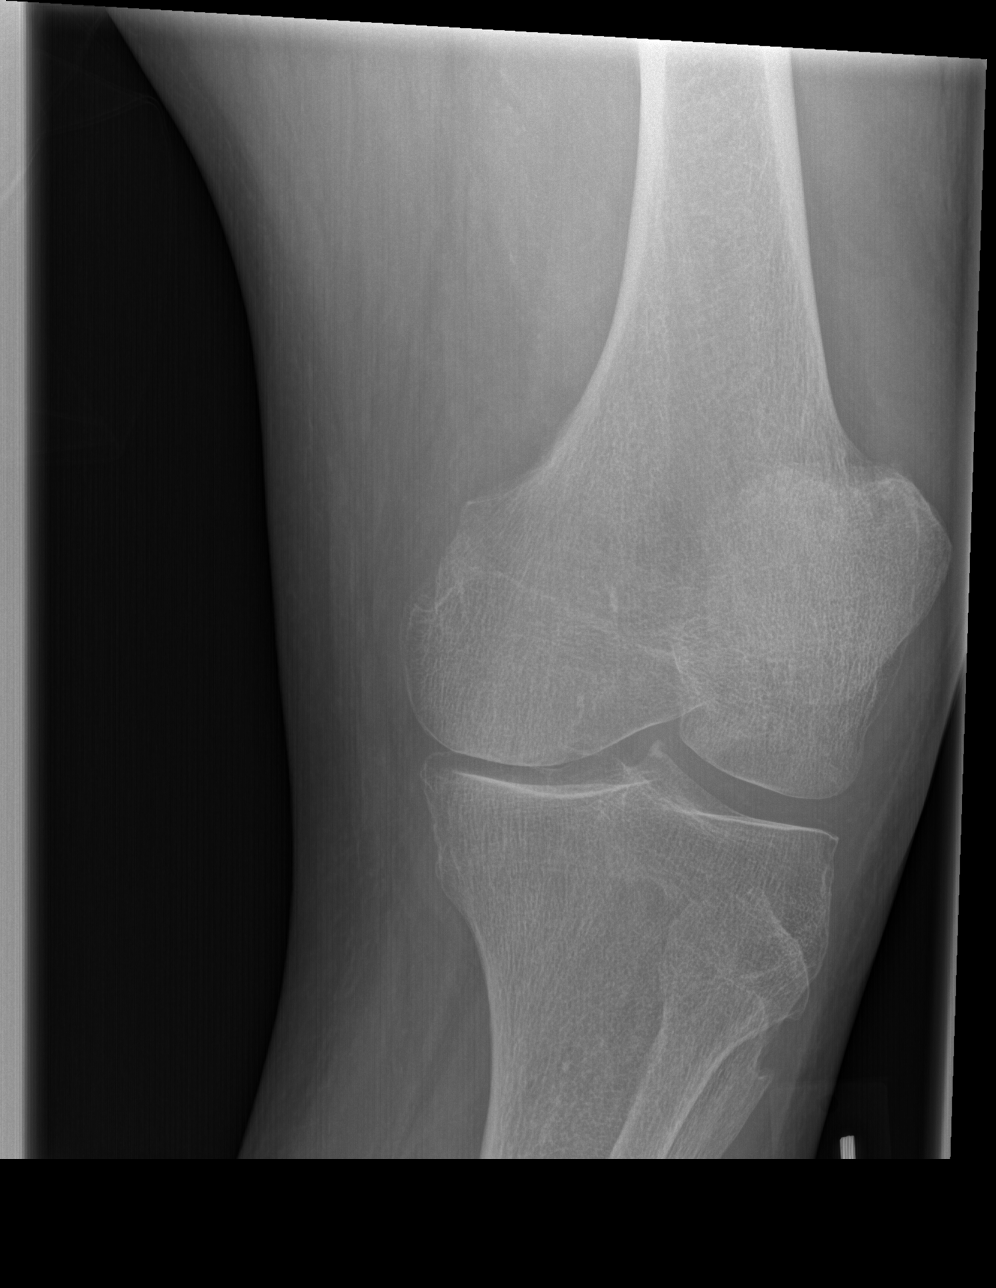

[x knee lat left]
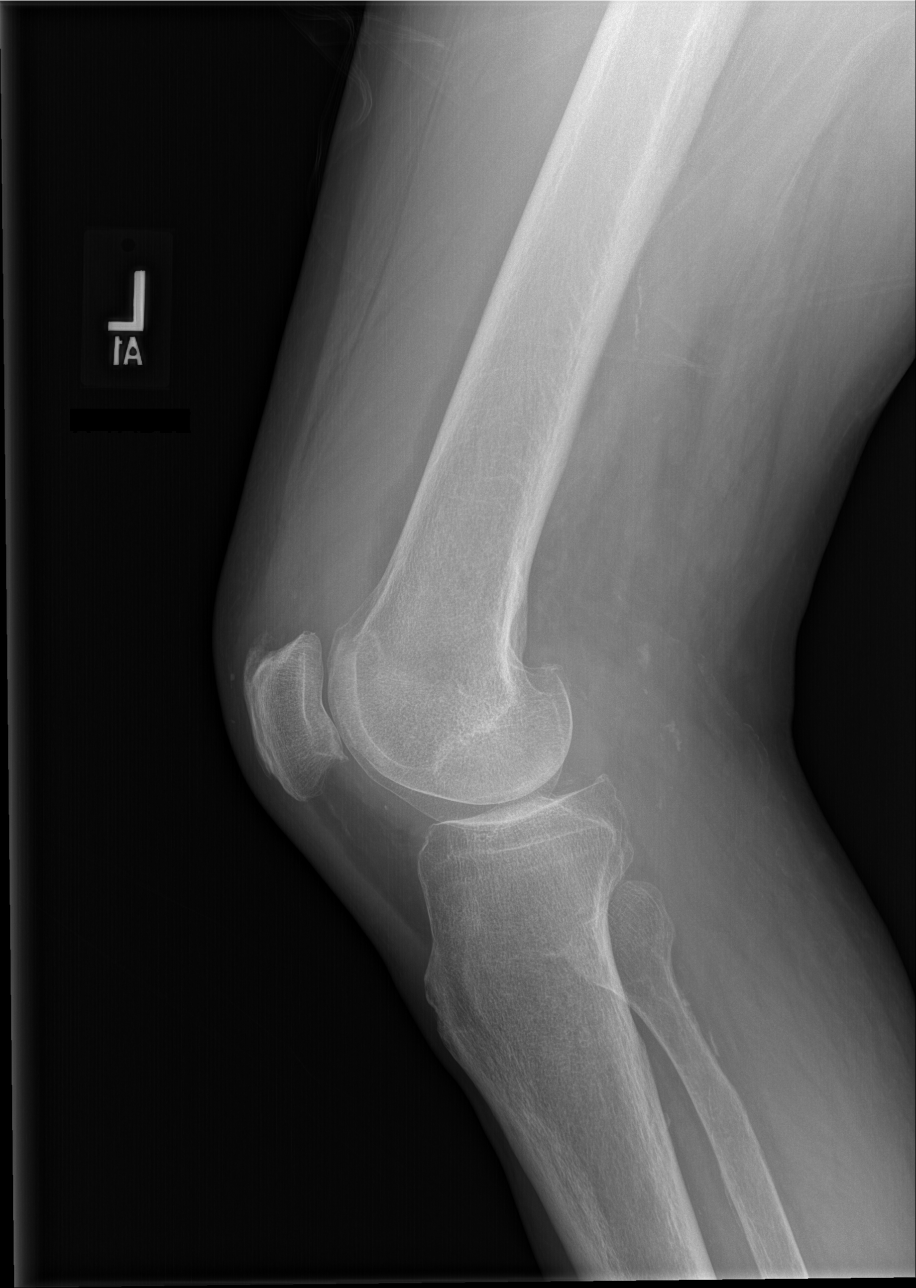

[4 of 4 positions shown; findings below may reference images not displayed]

FINDINGS: Small joint effusion. Joint space narrowing and spurring in the
medial and patellofemoral compartments. No acute bony abnormality.
Specifically, no fracture, subluxation, or dislocation. Soft tissues
are intact.
IMPRESSION: Mild degenerative changes with small joint effusion. No acute bony
abnormality.

## 2018-12-01 NOTE — Telephone Encounter (Signed)
I called the numbers provided and was told "[name]" is at work and the alternate number no one answered and no voice mail.

## 2018-12-01 NOTE — Telephone Encounter (Signed)
Please call NP back - pt was supposed to stop Linzess when we started Amitiza... so- stop Linzess, stop Senekot , and stop Miralax  for now - see how he does with Just Amitiza 24 mcg BID for a week or so - ask them to call back in 7-10 days with an update

## 2018-12-02 NOTE — Telephone Encounter (Signed)
Spoke with [name] [name] at [phone]. He is having soft stools in small amounts about 3 to 4 times a day. He states he will feel the urge to go at times but doesn't. Today he has taken his Linzess. The Senekot and Miralax are evening medications. He stopped taking Amitiza because he was uncertain what caused "the explosion." He will not take Senekot. He will take the Linzess out of his morning medications. If he has loose stools today, he will not take his Miralax dose. Tomorrow he will begin Amitiza as ordered with food twice a day morning and evening.  Speak with patient on Friday 12/04/2018 to get update. His nurse [name] [name] will see him again on 12/07/2018. Patient asks if she calls, we let her know we have discussed the plan with him.

## 2018-12-02 NOTE — Telephone Encounter (Signed)
Pt is calling back and can be reached at [phone]

## 2018-12-03 ENCOUNTER — Encounter: Payer: Self-pay | Admitting: Internal Medicine

## 2018-12-03 ENCOUNTER — Ambulatory Visit (INDEPENDENT_AMBULATORY_CARE_PROVIDER_SITE_OTHER): Payer: Medicare Other | Admitting: Internal Medicine

## 2018-12-03 DIAGNOSIS — N183 Chronic kidney disease, stage 3 unspecified: Secondary | ICD-10-CM

## 2018-12-03 DIAGNOSIS — I10 Essential (primary) hypertension: Secondary | ICD-10-CM

## 2018-12-03 DIAGNOSIS — K5900 Constipation, unspecified: Secondary | ICD-10-CM

## 2018-12-03 DIAGNOSIS — I480 Paroxysmal atrial fibrillation: Secondary | ICD-10-CM

## 2018-12-03 MED ORDER — TRIAMCINOLONE ACETONIDE 0.1 % EX OINT: 1.0000 "application " | TOPICAL_OINTMENT | Freq: Two times a day (BID) | CUTANEOUS | 3 refills | Status: DC

## 2018-12-03 NOTE — Assessment & Plan Note (Signed)
Eliquis 

## 2018-12-03 NOTE — Patient Instructions (Addendum)
Use baby wipes for your rear end, not a toilet paper

## 2018-12-03 NOTE — Assessment & Plan Note (Signed)
Monitor BMET. 

## 2018-12-03 NOTE — Assessment & Plan Note (Signed)
Toprol, Furosemide 

## 2018-12-03 NOTE — Progress Notes (Signed)
Subjective:  Patient ID: [name] [name], male    DOB: 09/13/1920  Age: 85 y.o. MRN: [id number]  CC: No chief complaint on file.   HPI [name] [name] presents for constipation, anxiety, A fib. He had a bad case of diarrhea a week ago ?from laxatives..he was seen by GI  Outpatient Medications Prior to Visit  Medication Sig Dispense Refill  . ALPRAZolam (XANAX) 1 MG tablet TAKE ONE TABLET AT BEDTIME. 30 tablet 3  . amiodarone (PACERONE) 200 MG tablet TAKE 1 TABLET ONCE DAILY. (Patient taking differently: Take 200 mg by mouth daily. ) 30 tablet 5  . apixaban (ELIQUIS) 2.5 MG TABS tablet Take 1 tablet (2.5 mg total) by mouth 2 (two) times daily. 60 tablet 11  . diclofenac sodium (VOLTAREN) 1 % GEL Apply 2 g topically 4 (four) times daily. 1 Tube 0  . diltiazem (CARDIZEM) 30 MG tablet Take 1 tablet (30 mg total) by mouth 2 (two) times daily. (Patient taking differently: Take 30 mg by mouth daily. ) 180 tablet 3  . finasteride (PROSCAR) 5 MG tablet Take 1 tablet (5 mg total) by mouth daily. 90 tablet 3  . hydrocortisone (ANUSOL-HC) 2.5 % rectal cream Place 1 application rectally 2 (two) times daily. 30 g 0  . linaclotide (LINZESS) 290 MCG CAPS capsule Take 1 capsule (290 mcg total) by mouth daily before breakfast. 90 capsule 3  . lubiprostone (AMITIZA) 24 MCG capsule Take 1 capsules by mouth twice daily. 60 capsule 3  . polyethylene glycol (MIRALAX / GLYCOLAX) packet Take 17 g by mouth at bedtime.    . pravastatin (PRAVACHOL) 20 MG tablet TAKE 1 TABLET EACH DAY. (Patient taking differently: Take 20 mg by mouth daily. ) 30 tablet 11  . senna (SENOKOT) 8.6 MG TABS tablet Take 1-2 tablets (8.6-17.2 mg total) by mouth at bedtime. (Patient taking differently: Take 1-2 tablets by mouth at bedtime as needed for mild constipation. ) 120 each 11  . torsemide (DEMADEX) 10 MG tablet Take 1 tablet (10 mg total) by mouth daily. 30 tablet 11  . traMADol (ULTRAM) 50 MG tablet Take 1 tablet (50 mg total) by mouth every  12 (twelve) hours as needed for moderate pain or severe pain. 10 tablet 0  . VENTOLIN HFA 108 (90 Base) MCG/ACT inhaler Inhale 2 puffs into the lungs every 6 (six) hours as needed for wheezing or shortness of breath.   0   No facility-administered medications prior to visit.     ROS: Review of Systems  Constitutional: Positive for fatigue. Negative for appetite change and unexpected weight change.  HENT: Negative for congestion, nosebleeds, sneezing, sore throat and trouble swallowing.   Eyes: Positive for visual disturbance. Negative for itching.  Respiratory: Negative for cough.   Cardiovascular: Negative for chest pain, palpitations and leg swelling.  Gastrointestinal: Positive for constipation and diarrhea. Negative for abdominal distention, blood in stool and nausea.  Genitourinary: Negative for frequency and hematuria.  Musculoskeletal: Positive for back pain and gait problem. Negative for joint swelling and neck pain.  Skin: Negative for rash.  Neurological: Negative for dizziness, tremors, speech difficulty and weakness.  Psychiatric/Behavioral: Negative for agitation, dysphoric mood and sleep disturbance. The patient is not nervous/anxious.     Objective:  BP 124/78 (BP Location: Left Arm, Patient Position: Sitting, Cuff Size: Normal)   Pulse 75   Temp 98 F (36.7 C) (Oral)   Ht 5\' 8"  (1.727 m)   Wt 179 lb (81.2 kg)   SpO2 99%  BMI 27.22 kg/m   BP Readings from Last 3 Encounters:  12/03/18 124/78  11/19/18 122/80  11/12/18 110/80    Wt Readings from Last 3 Encounters:  12/03/18 179 lb (81.2 kg)  11/19/18 179 lb (81.2 kg)  11/12/18 180 lb (81.6 kg)    Physical Exam Constitutional:      General: He is not in acute distress.    Appearance: He is well-developed.     Comments: NAD  Eyes:     Conjunctiva/sclera: Conjunctivae normal.     Pupils: Pupils are equal, round, and reactive to light.  Neck:     Musculoskeletal: Normal range of motion.     Thyroid: No  thyromegaly.     Vascular: No JVD.  Cardiovascular:     Rate and Rhythm: Normal rate and regular rhythm.     Heart sounds: Normal heart sounds. No murmur. No friction rub. No gallop.   Pulmonary:     Effort: Pulmonary effort is normal. No respiratory distress.     Breath sounds: Normal breath sounds. No wheezing or rales.  Chest:     Chest wall: No tenderness.  Abdominal:     General: Bowel sounds are normal. There is no distension.     Palpations: Abdomen is soft. There is no mass.     Tenderness: There is no abdominal tenderness. There is no guarding or rebound.  Musculoskeletal: Normal range of motion.        General: No tenderness.  Lymphadenopathy:     Cervical: No cervical adenopathy.  Skin:    General: Skin is warm and dry.     Findings: No rash.  Neurological:     Mental Status: He is alert and oriented to person, place, and time.     Cranial Nerves: No cranial nerve deficit.     Motor: Weakness present. No abnormal muscle tone.     Coordination: Coordination normal.     Gait: Gait abnormal.     Deep Tendon Reflexes: Reflexes are normal and symmetric.  Psychiatric:        Behavior: Behavior normal.        Thought Content: Thought content normal.        Judgment: Judgment normal.   cane  Lab Results  Component Value Date   WBC 4.8 10/22/2018   HGB 16.6 10/22/2018   HCT 50.0 10/22/2018   PLT 164.0 10/22/2018   GLUCOSE 119 (H) 10/22/2018   CHOL 112 03/30/2018   TRIG 80 03/30/2018   HDL 39 (L) 03/30/2018   LDLCALC 57 03/30/2018   ALT 13 10/14/2018   AST 25 10/14/2018   NA 139 10/22/2018   K 4.0 10/22/2018   CL 103 10/22/2018   CREATININE 1.57 (H) 10/22/2018   BUN 40 (H) 10/22/2018   CO2 27 10/22/2018   TSH 1.681 03/30/2018   PSA 0.24 03/08/2014   INR 1.32 03/30/2018   HGBA1C 5.7 (H) 03/31/2018    Dg Abd 2 Views  Result Date: 11/19/2018 CLINICAL DATA:  Constipation EXAM: ABDOMEN - 2 VIEW COMPARISON:  06/15/2018 FINDINGS: Prior cholecystectomy. Moderate  stool burden throughout the colon. Mild prominence of small bowel loops, decreased since prior study. No organomegaly or free air. IMPRESSION: Moderate stool burden. Mildly prominent small bowel loops, less pronounced than prior study. This is nonspecific. Electronically Signed   By: Rolm Baptise M.D.   On: 11/19/2018 10:12    Assessment & Plan:   There are no diagnoses linked to this encounter.   No orders of  the defined types were placed in this encounter.    Follow-up: No follow-ups on file.  Walker Kehr, MD

## 2018-12-03 NOTE — Assessment & Plan Note (Signed)
Amitiza Senakot Miralax Linzess was stopped

## 2018-12-04 NOTE — Telephone Encounter (Signed)
No answer. No voicemail. Will try again later.

## 2018-12-04 NOTE — Telephone Encounter (Signed)
Agree with plan  He may need to hold the Amitiza for a couple of days also - essentially hold everything until Monday and then start Amitiza only

## 2018-12-04 NOTE — Telephone Encounter (Signed)
Updated the plan with the patient. Will talk with him on Monday. He understands also that the plan is that this will stop these "explosions" and he will hopefully be able to resume Amitiza Monday.

## 2018-12-04 NOTE — Telephone Encounter (Signed)
Spoke with the patient.  He had "explosions" again today. He does not feel he is constipated and in fact is experiencing diarrhea. He will hold the Miralax. No Amitiza tonight. Resume tomorrow. Restated he has not taken Senekot or Linzess since we last talked. He is on his normal diet. No new medications or antibiotics. Bowel movements are not in excess of 3 daily.  Doc of the day, in Amy Esterwood's absence would you please review?

## 2018-12-07 NOTE — Telephone Encounter (Signed)
Spoke with the patient. Today he reports he has had no further watery bowel movements. No bowel movement at all yesterday. He will resume Amitiza today and he will begin with his lunchtime meal.  Talk again tomorrow to help determine the next step.

## 2018-12-07 NOTE — Telephone Encounter (Signed)
Thanks for talking with him.

## 2018-12-08 ENCOUNTER — Encounter (HOSPITAL_COMMUNITY): Payer: Self-pay

## 2018-12-08 ENCOUNTER — Observation Stay (HOSPITAL_COMMUNITY)
Admission: EM | Admit: 2018-12-08 | Discharge: 2018-12-09 | Disposition: A | Discharge status: Home / Self Care | Payer: Medicare Other | Attending: Internal Medicine | Admitting: Internal Medicine

## 2018-12-08 ENCOUNTER — Telehealth: Payer: Self-pay | Admitting: Physician Assistant

## 2018-12-08 ENCOUNTER — Other Ambulatory Visit: Payer: Self-pay

## 2018-12-08 DIAGNOSIS — N400 Enlarged prostate without lower urinary tract symptoms: Secondary | ICD-10-CM | POA: Insufficient documentation

## 2018-12-08 DIAGNOSIS — Z8249 Family history of ischemic heart disease and other diseases of the circulatory system: Secondary | ICD-10-CM | POA: Diagnosis not present

## 2018-12-08 DIAGNOSIS — K922 Gastrointestinal hemorrhage, unspecified: Principal | ICD-10-CM | POA: Insufficient documentation

## 2018-12-08 DIAGNOSIS — Z7901 Long term (current) use of anticoagulants: Secondary | ICD-10-CM | POA: Diagnosis not present

## 2018-12-08 DIAGNOSIS — N183 Chronic kidney disease, stage 3 unspecified: Secondary | ICD-10-CM | POA: Diagnosis present

## 2018-12-08 DIAGNOSIS — I248 Other forms of acute ischemic heart disease: Secondary | ICD-10-CM | POA: Diagnosis not present

## 2018-12-08 DIAGNOSIS — F341 Dysthymic disorder: Secondary | ICD-10-CM | POA: Diagnosis present

## 2018-12-08 DIAGNOSIS — I452 Bifascicular block: Secondary | ICD-10-CM | POA: Insufficient documentation

## 2018-12-08 DIAGNOSIS — K5909 Other constipation: Secondary | ICD-10-CM | POA: Insufficient documentation

## 2018-12-08 DIAGNOSIS — Z79899 Other long term (current) drug therapy: Secondary | ICD-10-CM | POA: Insufficient documentation

## 2018-12-08 DIAGNOSIS — F419 Anxiety disorder, unspecified: Secondary | ICD-10-CM | POA: Diagnosis not present

## 2018-12-08 DIAGNOSIS — I2510 Atherosclerotic heart disease of native coronary artery without angina pectoris: Secondary | ICD-10-CM | POA: Insufficient documentation

## 2018-12-08 DIAGNOSIS — E785 Hyperlipidemia, unspecified: Secondary | ICD-10-CM | POA: Insufficient documentation

## 2018-12-08 DIAGNOSIS — I5042 Chronic combined systolic (congestive) and diastolic (congestive) heart failure: Secondary | ICD-10-CM | POA: Insufficient documentation

## 2018-12-08 DIAGNOSIS — K648 Other hemorrhoids: Secondary | ICD-10-CM | POA: Diagnosis not present

## 2018-12-08 DIAGNOSIS — F329 Major depressive disorder, single episode, unspecified: Secondary | ICD-10-CM | POA: Diagnosis not present

## 2018-12-08 DIAGNOSIS — I4510 Unspecified right bundle-branch block: Secondary | ICD-10-CM | POA: Diagnosis not present

## 2018-12-08 DIAGNOSIS — M1990 Unspecified osteoarthritis, unspecified site: Secondary | ICD-10-CM | POA: Insufficient documentation

## 2018-12-08 DIAGNOSIS — I4891 Unspecified atrial fibrillation: Secondary | ICD-10-CM | POA: Diagnosis not present

## 2018-12-08 DIAGNOSIS — I5022 Chronic systolic (congestive) heart failure: Secondary | ICD-10-CM | POA: Diagnosis present

## 2018-12-08 DIAGNOSIS — K921 Melena: Secondary | ICD-10-CM

## 2018-12-08 DIAGNOSIS — I482 Chronic atrial fibrillation, unspecified: Secondary | ICD-10-CM

## 2018-12-08 DIAGNOSIS — I130 Hypertensive heart and chronic kidney disease with heart failure and stage 1 through stage 4 chronic kidney disease, or unspecified chronic kidney disease: Secondary | ICD-10-CM | POA: Diagnosis not present

## 2018-12-08 DIAGNOSIS — I480 Paroxysmal atrial fibrillation: Secondary | ICD-10-CM | POA: Diagnosis not present

## 2018-12-08 DIAGNOSIS — I420 Dilated cardiomyopathy: Secondary | ICD-10-CM | POA: Diagnosis not present

## 2018-12-08 DIAGNOSIS — K641 Second degree hemorrhoids: Secondary | ICD-10-CM

## 2018-12-08 LAB — CBC
HEMATOCRIT: 52.9 % — AB (ref 39.0–52.0)
Hemoglobin: 16.5 g/dL (ref 13.0–17.0)
MCH: 31.6 pg (ref 26.0–34.0)
MCHC: 31.2 g/dL (ref 30.0–36.0)
MCV: 101.3 fL — ABNORMAL HIGH (ref 80.0–100.0)
Platelets: 120 10*3/uL — ABNORMAL LOW (ref 150–400)
RBC: 5.22 MIL/uL (ref 4.22–5.81)
RDW: 15.2 % (ref 11.5–15.5)
WBC: 6.7 10*3/uL (ref 4.0–10.5)
nRBC: 0 % (ref 0.0–0.2)

## 2018-12-08 LAB — COMPREHENSIVE METABOLIC PANEL
ALT: 7 U/L (ref 0–44)
AST: 20 U/L (ref 15–41)
Albumin: 3.7 g/dL (ref 3.5–5.0)
Alkaline Phosphatase: 70 U/L (ref 38–126)
Anion gap: 8 (ref 5–15)
BUN: 30 mg/dL — ABNORMAL HIGH (ref 8–23)
CHLORIDE: 106 mmol/L (ref 98–111)
CO2: 26 mmol/L (ref 22–32)
Calcium: 8.5 mg/dL — ABNORMAL LOW (ref 8.9–10.3)
Creatinine, Ser: 1.59 mg/dL — ABNORMAL HIGH (ref 0.61–1.24)
GFR calc Af Amer: 42 mL/min — ABNORMAL LOW (ref >60)
GFR calc non Af Amer: 36 mL/min — ABNORMAL LOW (ref >60)
Glucose, Bld: 97 mg/dL (ref 70–99)
Potassium: 4.5 mmol/L (ref 3.5–5.1)
Sodium: 140 mmol/L (ref 135–145)
Total Bilirubin: 2 mg/dL — ABNORMAL HIGH (ref 0.3–1.2)
Total Protein: 6.8 g/dL (ref 6.5–8.1)

## 2018-12-08 LAB — HEMOGLOBIN AND HEMATOCRIT, BLOOD
HCT: 50 % (ref 39.0–52.0)
Hemoglobin: 15.7 g/dL (ref 13.0–17.0)

## 2018-12-08 LAB — PROTIME-INR
INR: 1.28
Prothrombin Time: 15.9 seconds — ABNORMAL HIGH (ref 11.4–15.2)

## 2018-12-08 LAB — TYPE AND SCREEN
ABO/RH(D): O POS
Antibody Screen: NEGATIVE

## 2018-12-08 MED ORDER — FINASTERIDE 5 MG PO TABS
5.0000 mg | ORAL_TABLET | Freq: Every day | ORAL | Status: DC
  Administered 2018-12-09: 5 mg via ORAL
  Filled 2018-12-08: qty 1

## 2018-12-08 MED ORDER — HYDROCORTISONE 2.5 % RE CREA: 1.0000 "application " | TOPICAL_CREAM | Freq: Two times a day (BID) | RECTAL | Status: DC

## 2018-12-08 MED ORDER — ALPRAZOLAM 0.5 MG PO TABS: 1.0000 mg | ORAL_TABLET | Freq: Every day | ORAL | Status: DC

## 2018-12-08 MED ORDER — DILTIAZEM HCL 30 MG PO TABS
30.0000 mg | ORAL_TABLET | Freq: Two times a day (BID) | ORAL | Status: DC
  Administered 2018-12-09: 30 mg via ORAL
  Filled 2018-12-08 (×2): qty 1

## 2018-12-08 MED ORDER — LUBIPROSTONE 8 MCG PO CAPS
8.0000 ug | ORAL_CAPSULE | Freq: Two times a day (BID) | ORAL | Status: DC
  Filled 2018-12-08: qty 1

## 2018-12-08 MED ORDER — DICLOFENAC SODIUM 1 % TD GEL: 2.0000 g | Freq: Four times a day (QID) | TRANSDERMAL | Status: DC

## 2018-12-08 MED ORDER — PRAVASTATIN SODIUM 20 MG PO TABS: 20.0000 mg | ORAL_TABLET | Freq: Every day | ORAL | Status: DC

## 2018-12-08 MED ORDER — TORSEMIDE 10 MG PO TABS
10.0000 mg | ORAL_TABLET | Freq: Every day | ORAL | Status: DC
  Administered 2018-12-09: 10 mg via ORAL
  Filled 2018-12-08: qty 1

## 2018-12-08 MED ORDER — AMIODARONE HCL 200 MG PO TABS
200.0000 mg | ORAL_TABLET | Freq: Every day | ORAL | Status: DC
  Administered 2018-12-09: 200 mg via ORAL
  Filled 2018-12-08: qty 1

## 2018-12-08 MED ORDER — SODIUM CHLORIDE 0.9 % IV SOLN
INTRAVENOUS | Status: DC
  Administered 2018-12-08: 21:00:00 via INTRAVENOUS

## 2018-12-08 NOTE — Telephone Encounter (Signed)
The pt called in to say that he got up to go to the bathroom to try and have a BM but before he got to the toilet he had a gush of bright red blood from his rectum.  He says he has not had a BM and when he stands up he feels as if bleeding returns. He has no dizziness, SOB or pain.  He has never had this problem before. He is anxious and worried about the bleeding.  He was advised to go to the ED for evaluation.  He agreed and states he has someone that can take him.  Beth Mckew was on the phone with Dr Silverio Decamp at the time I was speaking with the pt and she is aware that he is headed to the ED.

## 2018-12-08 NOTE — ED Notes (Signed)
Admitting MD at bedside.

## 2018-12-08 NOTE — ED Notes (Signed)
Admitting MD has left room. Pt being transporting now.

## 2018-12-08 NOTE — Telephone Encounter (Signed)
Mo - he does not need a SBFT.Marland KitchenMarland Kitchen

## 2018-12-08 NOTE — Telephone Encounter (Signed)
Pt called to report that he is hemorrhaging.  Please CB to advise.

## 2018-12-08 NOTE — Telephone Encounter (Signed)
Pt called to inform that he is at the ED at [facility] and that the hemorrhaging has stopped.  FYI.

## 2018-12-08 NOTE — Telephone Encounter (Signed)
Spoke with the nurse who fills the patient's pill box once weekly. She has removed the Amitiza and replaced it with the Linzess and Senokot. Patient reports no bowel movement. Concerns for constipation again. She feels he does respond better to Linzess.The patient is concerned the Amitiza caused diarrhea. Reviewing other phone messages, the patient was purged for the constipation. He was erroneously left on Linzess, Senokot, Miralax when started on Amitiza. He developed uncontrollable diarrhea.   The plan will be to stop Amitiza and Miralax. Resume Linzess daily and Senokot PRN per Nicoletta Ba, PA-C. The nurse also wants to know if further testing needs to be done because the patient has no consistency of his bowel movement. She specifically asks if there is benefit to be gained with a small bowell follow through.

## 2018-12-08 NOTE — H&P (Signed)
PCP:   Plotnikov, Evie Lacks, MD   Code status: DNR  Chief Complaint:  Gi bleed  HPI: this is a 71 y/omalewho lives at home, he presents with c/o GI bleed. He noted rectal bleeding today. His visiting nurse stopped by today. He was sitting in his chair and had sudden onset of rectal bleeding. It was BRBPR.  He has atrial fibrillation and is on eliquis. His last colonoscopy was along time ago likely greater than 10. He has had hemorrhoidal bleed in the past before but never this bad. The bleeding eventually stopped by itself. It was a significant bleed. He believes he was bleeding for prob 30 min with free flow. He states it even started puddling under his feet at one point. 911 was called and he was brought to the ER.  He denies abdominal pain. He has a h/o constiation. His last BM was 4 days ago.  He denies being lightheaded or dizzy.  History provided by the patient who is alert and oriented  Review of Systems:  The patient denies anorexia, fever, weight loss,, vision loss, cough, decreased hearing, hoarseness, chest pain, syncope, dyspnea on exertion, peripheral edema, balance deficits, hemoptysis, abdominal pain, melena, bright red blood per rectum, severe indigestion/heartburn, hematuria, incontinence, genital sores, muscle weakness, suspicious skin lesions, transient blindness, difficulty walking, depression, unusual weight change, abnormal bleeding, enlarged lymph nodes, angioedema, and breast masses.  Past Medical History: Past Medical History:  Diagnosis Date  . Anxiety   . Arthritis    "shoulders" (05/28/2016)  . Cardiomyopathy   . Coronary artery disease   . Depression    hx (05/28/2016)  . Hemorrhoids   . Hyperlipidemia   . Hypertension   . Paroxysmal atrial fibrillation (HCC)   . Skipped heart beats    Past Surgical History:  Procedure Laterality Date  . APPENDECTOMY    . CARDIOVERSION N/A 06/04/2016   Procedure: CARDIOVERSION;  Surgeon: Pixie Casino, MD;  Location:  [facility] ENDOSCOPY;  Service: Cardiovascular;  Laterality: N/A;  . CARDIOVERSION N/A 04/09/2017   Procedure: CARDIOVERSION;  Surgeon: Thayer Headings, MD;  Location: [facility] ENDOSCOPY;  Service: Cardiovascular;  Laterality: N/A;  . CARDIOVERSION N/A 04/01/2018   Procedure: CARDIOVERSION;  Surgeon: Skeet Latch, MD;  Location: [facility];  Service: Cardiovascular;  Laterality: N/A;  . CATARACT EXTRACTION Left   . CHOLECYSTECTOMY OPEN    . COLONOSCOPY W/ BIOPSIES AND POLYPECTOMY    . INGUINAL HERNIA REPAIR Bilateral   . MYRINGOTOMY WITH TUBE PLACEMENT Bilateral   . skin cancer removal Right    side of nose by Rt eye  . TEE WITHOUT CARDIOVERSION N/A 06/04/2016   Procedure: TRANSESOPHAGEAL ECHOCARDIOGRAM (TEE);  Surgeon: Pixie Casino, MD;  Location: [facility] ENDOSCOPY;  Service: Cardiovascular;  Laterality: N/A;  . TEE WITHOUT CARDIOVERSION N/A 04/01/2018   Procedure: TRANSESOPHAGEAL ECHOCARDIOGRAM (TEE);  Surgeon: Skeet Latch, MD;  Location: [facility];  Service: Cardiovascular;  Laterality: N/A;  . TONSILLECTOMY AND ADENOIDECTOMY      Medications: Prior to Admission medications   Medication Sig Start Date End Date Taking? Authorizing Provider  amiodarone (PACERONE) 200 MG tablet TAKE 1 TABLET ONCE DAILY. Patient taking differently: Take 200 mg by mouth daily.  08/12/18  Yes Plotnikov, Evie Lacks, MD  apixaban (ELIQUIS) 2.5 MG TABS tablet Take 1 tablet (2.5 mg total) by mouth 2 (two) times daily. 01/14/18  Yes Minus Breeding, MD  diltiazem (CARDIZEM) 30 MG tablet Take 1 tablet (30 mg total) by mouth 2 (two) times daily. Patient taking  differently: Take 30 mg by mouth daily.  08/10/18  Yes Plotnikov, Evie Lacks, MD  finasteride (PROSCAR) 5 MG tablet Take 1 tablet (5 mg total) by mouth daily. 08/10/18  Yes Plotnikov, Evie Lacks, MD  lubiprostone (AMITIZA) 24 MCG capsule Take 1 capsules by mouth twice daily. 11/19/18  Yes Esterwood, Amy S, PA-C  pravastatin (PRAVACHOL) 20 MG tablet TAKE 1 TABLET EACH  DAY. Patient taking differently: Take 20 mg by mouth daily.  04/09/18  Yes Plotnikov, Evie Lacks, MD  torsemide (DEMADEX) 10 MG tablet Take 1 tablet (10 mg total) by mouth daily. 09/30/18  Yes Plotnikov, Evie Lacks, MD  triamcinolone ointment (KENALOG) 0.1 % Apply 1 application topically 2 (two) times daily. To irritated skin 12/03/18  Yes Plotnikov, Evie Lacks, MD  ALPRAZolam (XANAX) 1 MG tablet TAKE ONE TABLET AT BEDTIME. Patient not taking: Reported on 12/08/2018 11/30/18   Plotnikov, Evie Lacks, MD  diclofenac sodium (VOLTAREN) 1 % GEL Apply 2 g topically 4 (four) times daily. Patient not taking: Reported on 12/08/2018 10/17/18   Mariel Aloe, MD  hydrocortisone (ANUSOL-HC) 2.5 % rectal cream Place 1 application rectally 2 (two) times daily. Patient not taking: Reported on 12/08/2018 11/12/18   Hoyt Koch, MD  senna (SENOKOT) 8.6 MG TABS tablet Take 1-2 tablets (8.6-17.2 mg total) by mouth at bedtime. Patient not taking: Reported on 12/08/2018 08/10/18   Plotnikov, Evie Lacks, MD  traMADol (ULTRAM) 50 MG tablet Take 1 tablet (50 mg total) by mouth every 12 (twelve) hours as needed for moderate pain or severe pain. Patient not taking: Reported on 12/08/2018 10/17/18   Mariel Aloe, MD  VENTOLIN HFA 108 [phone] Base) MCG/ACT inhaler Inhale 2 puffs into the lungs every 6 (six) hours as needed for wheezing or shortness of breath.  03/19/18   [provider]    Allergies:   Allergies  Allergen Reactions  . Lisinopril Cough    Social History:  reports that he has never smoked. He has never used smokeless tobacco. He reports that he does not drink alcohol or use drugs.  Family History: Family History  Problem Relation Age of Onset  . Heart disease Brother     Physical Exam: Vitals:   12/08/18 1630 12/08/18 1640 12/08/18 1653 12/08/18 1845  BP: 120/76 (!) 139/108  (!) 111/48  Pulse: 90 96 97 72  Resp: 17 16 16 19   Temp:      TempSrc:      SpO2: 97% 97% 97% 96%  Weight:       Height:        General:  Alert and oriented times three, well developed and nourished, no acute distress Eyes: cataract right eye, pink conjunctiva, no scleral icterus ENT: Moist oral mucosa, neck supple, no thyromegaly Lungs: clear to ascultation, no wheeze, no crackles, no use of accessory muscles Cardiovascular: regular rate and rhythm, no regurgitation, no gallops, no murmurs. No carotid bruits, no JVD Abdomen: soft, positive BS, non-tender, non-distended, no organomegaly, not an acute abdomen GU: not examined Neuro: CN II - XII grossly intact, sensation intact Musculoskeletal: strength 5/5 all extremities, no clubbing, cyanosis or >3+ B/L LE edema Skin: no rash, no subcutaneous crepitation, no decubitus Psych: appropriate patient   Labs on Admission:  Recent Labs    12/08/18 1639  NA 140  K 4.5  CL 106  CO2 26  GLUCOSE 97  BUN 30*  CREATININE 1.59*  CALCIUM 8.5*   Recent Labs    12/08/18 1639  AST  20  ALT 7  ALKPHOS 70  BILITOT 2.0*  PROT 6.8  ALBUMIN 3.7   No results for input(s): LIPASE, AMYLASE in the last 72 hours. Recent Labs    12/08/18 1639  WBC 6.7  HGB 16.5  HCT 52.9*  MCV 101.3*  PLT 120*   No results for input(s): CKTOTAL, CKMB, CKMBINDEX, TROPONINI in the last 72 hours. Invalid input(s): POCBNP No results for input(s): DDIMER in the last 72 hours. No results for input(s): HGBA1C in the last 72 hours. No results for input(s): CHOL, HDL, LDLCALC, TRIG, CHOLHDL, LDLDIRECT in the last 72 hours. No results for input(s): TSH, T4TOTAL, T3FREE, THYROIDAB in the last 72 hours.  Invalid input(s): FREET3 No results for input(s): VITAMINB12, FOLATE, FERRITIN, TIBC, IRON, RETICCTPCT in the last 72 hours.  Micro Results: No results found for this or any previous visit (from the past 240 hour(s)).   Radiological Exams on Admission: No results found.  Assessment/Plan Present on Admission: . GI bleeding -admit to medsurg -NPO, genlte  IVF -serial H/H -hold eliquis -GI aware, consulted by ER physician -PPI IV ordered, but less likely upper GI bleed -CEA ordered -suspect diverticular or hemrhoidal bleed  . Chronic atrial fibrillation Chronic anticoag -Stable, home meds resumed except for Eliquis  . CKD (chronic kidney disease) stage 3, GFR 30-59 ml/min (HCC)  -Stable, at baseline  . Congestive dilated cardiomyopathy (HCC)  -Stable, home meds resumed  . Dyslipidemia -Stable, home meds held  . BPH (benign prostatic hyperplasia) -Stable, home meds resumed   Annemarie Sebree 12/08/2018, 7:31 PM

## 2018-12-08 NOTE — ED Notes (Signed)
ED TO INPATIENT HANDOFF REPORT  Name/Age/Gender [name] [name] 85 y.o. male  Code Status Code Status History    Date Active Date Inactive Code Status Order ID Comments User Context   10/14/2018 1544 10/17/2018 2022 DNR [id number]  Desiree Hane, MD Inpatient   06/15/2018 0805 06/18/2018 1629 DNR [id number]  Debbe Odea, MD ED   03/30/2018 2225 04/04/2018 1913 Full Code [id number]  Bennie Pierini, MD Inpatient   04/06/2017 1842 04/14/2017 1819 Full Code [id number]  Jonetta Osgood, MD Inpatient   08/22/2016 1801 08/23/2016 1802 Full Code [id number]  Louellen Molder, MD Inpatient   08/22/2016 1651 08/22/2016 1801 Full Code [id number]  Louellen Molder, MD ED   06/03/2016 2019 06/05/2016 1823 Full Code [id number]  Schorr, Rhetta Mura, NP Inpatient   05/28/2016 1514 06/03/2016 2019 DNR [id number]  Samella Parr, NP Inpatient    Questions for Most Recent Historical Code Status (Order [id number])    Question Answer Comment   In the event of cardiac or respiratory ARREST Do not call a "code blue"    In the event of cardiac or respiratory ARREST Do not perform Intubation, CPR, defibrillation or ACLS    In the event of cardiac or respiratory ARREST Use medication by any route, position, wound care, and other measures to relive pain and suffering. May use oxygen, suction and manual treatment of airway obstruction as needed for comfort.         Advance Directive Documentation     Most Recent Value  Type of Advance Directive  Healthcare Power of Attorney, Living will  Pre-existing out of facility DNR order (yellow form or pink MOST form)  -  "MOST" Form in Place?  -      Home/SNF/Other Home  Chief Complaint reactal bleeding  Level of Care/Admitting Diagnosis ED Disposition    ED Disposition Condition [place]: [facility] [[id number]]  Level of Care: Med-Surg [16]  Diagnosis: GI bleeding [[id number]]  Admitting Physician: Halfway, Humphreys  Attending  Physician: Quintella Baton [[id number]]  Estimated length of stay: past midnight tomorrow  Certification:: I certify this patient will need inpatient services for at least 2 midnights  PT Class (Do Not Modify): Inpatient [101]  PT Acc Code (Do Not Modify): Private [1]       Medical History Past Medical History:  Diagnosis Date  . Anxiety   . Arthritis    "shoulders" (05/28/2016)  . Cardiomyopathy   . Coronary artery disease   . Depression    hx (05/28/2016)  . Hemorrhoids   . Hyperlipidemia   . Hypertension   . Paroxysmal atrial fibrillation (HCC)   . Skipped heart beats     Allergies Allergies  Allergen Reactions  . Lisinopril Cough    IV Location/Drains/Wounds Patient Lines/Drains/Airways Status   Active Line/Drains/Airways    Name:   Placement date:   Placement time:   Site:   Days:   Peripheral IV 12/08/18 Right Hand   12/08/18    1623    Hand   less than 1   Wound 10/13/12 Abrasion(s) Face Left bleeding controlled, red   10/13/12    1147    Face   2247   Wound 10/13/12 Skin tear Wrist Left bleeding controlled, red, purple, black   10/13/12    1148    Wrist   2247   Wound 10/13/12 Abrasion(s) Knee Left bleeding controlled, red   10/13/12    1149    Knee  2247   Wound / Incision (Open or Dehisced) 05/28/16 Laceration Head Upper;Mid jagged   05/28/16    1130    Head   924   Wound / Incision (Open or Dehisced) 05/30/16 Laceration Arm Right   05/30/16    2115    Arm   922   Wound / Incision (Open or Dehisced) 08/22/16 Laceration Head Posterior Old blood with 2 staples from ED   08/22/16    1759    Head   838          Labs/Imaging Results for orders placed or performed during the hospital encounter of 12/08/18 (from the past 48 hour(s))  Type and screen [facility]     Status: None   Collection Time: 12/08/18  4:22 PM  Result Value Ref Range   ABO/RH(D) O POS    Antibody Screen NEG    Sample Expiration      12/11/2018 Performed at [facility], [place] [address] [place], [place] [address]   Comprehensive metabolic panel     Status: Abnormal   Collection Time: 12/08/18  4:39 PM  Result Value Ref Range   Sodium 140 135 - 145 mmol/L   Potassium 4.5 3.5 - 5.1 mmol/L   Chloride 106 98 - 111 mmol/L   CO2 26 22 - 32 mmol/L   Glucose, Bld 97 70 - 99 mg/dL   BUN 30 (H) 8 - 23 mg/dL   Creatinine, Ser 1.59 (H) 0.61 - 1.24 mg/dL   Calcium 8.5 (L) 8.9 - 10.3 mg/dL   Total Protein 6.8 6.5 - 8.1 g/dL   Albumin 3.7 3.5 - 5.0 g/dL   AST 20 15 - 41 U/L   ALT 7 0 - 44 U/L   Alkaline Phosphatase 70 38 - 126 U/L   Total Bilirubin 2.0 (H) 0.3 - 1.2 mg/dL   GFR calc non Af Amer 36 (L) >60 mL/min   GFR calc Af Amer 42 (L) >60 mL/min   Anion gap 8 5 - 15    Comment: Performed at [facility], [place] [address], [place], [place] [address]  CBC     Status: Abnormal   Collection Time: 12/08/18  4:39 PM  Result Value Ref Range   WBC 6.7 4.0 - 10.5 K/uL   RBC 5.22 4.22 - 5.81 MIL/uL   Hemoglobin 16.5 13.0 - 17.0 g/dL   HCT 52.9 (H) 39.0 - 52.0 %   MCV 101.3 (H) 80.0 - 100.0 fL   MCH 31.6 26.0 - 34.0 pg   MCHC 31.2 30.0 - 36.0 g/dL   RDW 15.2 11.5 - 15.5 %   Platelets 120 (L) 150 - 400 K/uL    Comment: Immature Platelet Fraction may be clinically indicated, consider ordering this additional test [id number]    nRBC 0.0 0.0 - 0.2 %    Comment: Performed at [facility], [place] [address] [place], [place] [address]  Protime-INR - (order if Patient is taking Coumadin / Warfarin)     Status: Abnormal   Collection Time: 12/08/18  4:39 PM  Result Value Ref Range   Prothrombin Time 15.9 (H) 11.4 - 15.2 seconds   INR 1.28     Comment: Performed at [facility], [place] [address], [place], [place] [address]   No results found.  Pending Labs Unresulted Labs (From admission, onward)   None      Vitals/Pain Today's Vitals   12/08/18 1640 12/08/18 1653 12/08/18  1751 12/08/18  1845  BP: (!) 139/108   (!) 111/48  Pulse: 96 97  72  Resp: 16 16  19   Temp:      TempSrc:      SpO2: 97% 97%  96%  Weight:      Height:      PainSc:   0-No pain     Isolation Precautions No active isolations  Medications Medications - No data to display  Mobility walks with person assist

## 2018-12-08 NOTE — ED Triage Notes (Addendum)
Patient is AOx4 and ambulatory. Patient is accompanied by son. Patient is having rectal bleeding which began about 2 hours ago and is taking elequis for A-Fib. Patient is asymptomatic at this time.
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
## 2018-12-08 NOTE — Telephone Encounter (Signed)
Pt Nurse is calling wanting to speak with nurse about pt bowel movements and medications.

## 2018-12-09 DIAGNOSIS — K641 Second degree hemorrhoids: Secondary | ICD-10-CM | POA: Diagnosis not present

## 2018-12-09 DIAGNOSIS — K921 Melena: Secondary | ICD-10-CM

## 2018-12-09 DIAGNOSIS — Z7901 Long term (current) use of anticoagulants: Secondary | ICD-10-CM | POA: Diagnosis not present

## 2018-12-09 DIAGNOSIS — K922 Gastrointestinal hemorrhage, unspecified: Secondary | ICD-10-CM | POA: Diagnosis present

## 2018-12-09 DIAGNOSIS — K648 Other hemorrhoids: Secondary | ICD-10-CM | POA: Diagnosis not present

## 2018-12-09 LAB — HEMOGLOBIN AND HEMATOCRIT, BLOOD
HCT: 46.6 % (ref 39.0–52.0)
HCT: 51.4 % (ref 39.0–52.0)
Hemoglobin: 14.7 g/dL (ref 13.0–17.0)
Hemoglobin: 16.4 g/dL (ref 13.0–17.0)

## 2018-12-09 LAB — BASIC METABOLIC PANEL
Anion gap: 7 (ref 5–15)
BUN: 28 mg/dL — AB (ref 8–23)
CO2: 28 mmol/L (ref 22–32)
Calcium: 8.4 mg/dL — ABNORMAL LOW (ref 8.9–10.3)
Chloride: 106 mmol/L (ref 98–111)
Creatinine, Ser: 1.48 mg/dL — ABNORMAL HIGH (ref 0.61–1.24)
GFR calc Af Amer: 46 mL/min — ABNORMAL LOW (ref >60)
GFR calc non Af Amer: 39 mL/min — ABNORMAL LOW (ref >60)
Glucose, Bld: 92 mg/dL (ref 70–99)
Potassium: 4.3 mmol/L (ref 3.5–5.1)
Sodium: 141 mmol/L (ref 135–145)

## 2018-12-09 MED ORDER — APIXABAN 2.5 MG PO TABS: 2.5000 mg | ORAL_TABLET | Freq: Two times a day (BID) | ORAL | 11 refills | Status: DC

## 2018-12-09 MED ORDER — APIXABAN 2.5 MG PO TABS: 2.5000 mg | ORAL_TABLET | Freq: Two times a day (BID) | ORAL | Status: DC

## 2018-12-09 MED ORDER — HYDROCORTISONE ACETATE 25 MG RE SUPP
25.0000 mg | Freq: Two times a day (BID) | RECTAL | Status: DC
  Filled 2018-12-09: qty 1

## 2018-12-09 MED ORDER — SENNOSIDES-DOCUSATE SODIUM 8.6-50 MG PO TABS
1.0000 | ORAL_TABLET | Freq: Once | ORAL | Status: AC
  Administered 2018-12-09: 1 via ORAL
  Filled 2018-12-09: qty 1

## 2018-12-09 MED ORDER — POLYETHYLENE GLYCOL 3350 17 GM/SCOOP PO POWD: ORAL | 0 refills | Status: DC

## 2018-12-09 MED ORDER — HYDROCORTISONE ACETATE 25 MG RE SUPP: 25.0000 mg | Freq: Two times a day (BID) | RECTAL | 0 refills | Status: DC

## 2018-12-09 NOTE — Discharge Instructions (Signed)
Consider looking into ALF facilities as long term planning

## 2018-12-09 NOTE — Discharge Summary (Signed)
Physician Discharge Summary  [name] [name] IWL:[id number] DOB: 1920-08-28 DOA: 12/08/2018  PCP: Cassandria Anger, MD  Admit date: 12/08/2018 Discharge date: 12/09/2018  Admitted From: home Discharge disposition: home   Recommendations for Outpatient Follow-Up:   1. Outpatient GI follow up 2. Resume home health RN   Discharge Diagnosis:   Active Problems:   Dyslipidemia   ANXIETY DEPRESSION   BPH (benign prostatic hyperplasia)   CKD (chronic kidney disease) stage 3, GFR 30-59 ml/min (HCC)   Congestive dilated cardiomyopathy (HCC)   Systolic CHF, chronic (HCC)   Chronic anticoagulation   Chronic atrial fibrillation   Acute GI bleeding   GI bleed    Discharge Condition: Improved.  Diet recommendation: Low sodium, heart healthy.  Carbohydrate-modified.  Regular.  Wound care: None.  Code status: Full.   History of Present Illness:   43 y/omalewho lives at home, he presents with c/o GI bleed. He noted rectal bleeding today. His visiting nurse stopped by today. He was sitting in his chair and had sudden onset of rectal bleeding. It was BRBPR.  He has atrial fibrillation and is on eliquis. His last colonoscopy was along time ago likely greater than 10. He has had hemorrhoidal bleed in the past before but never this bad. The bleeding eventually stopped by itself. It was a significant bleed. He believes he was bleeding for prob 30 min with free flow. He states it even started puddling under his feet at one point. 911 was called and he was brought to the ER.   Hospital Course by Problem:  Per GI:  Large right posterior internal hemorrhoid with erythema and stigmata of recent bleeding on anoscopy.  No active bleeding.  No anal fissure or ulceration in the anal canal.  No palpable nodule on digital exam.  Hemodynamically stable with no significant drop in hemoglobin  Start Benefiber 1 tablespoon 2-3 times daily Continue Amitiza twice daily Start MiraLAX 1 capful  daily, titrate up if needed to have daily soft bowel movement Okay to restart Eliquis tomorrow if no further bleeding Will arrange for follow-up in GI office within next 2 weeks Okay to discharge home from GI standpoint    Medical Consultants:    GI  Discharge Exam:   Vitals:   12/09/18 1000 12/09/18 1028  BP: 121/88   Pulse: (!) 102 88  Resp:    Temp: 98.2 F (36.8 C)   SpO2: 98%    Vitals:   12/08/18 2015 12/09/18 0554 12/09/18 1000 12/09/18 1028  BP: (!) 138/93 130/76 121/88   Pulse: 88 76 (!) 102 88  Resp: 18 18    Temp: 97.9 F (36.6 C) 98.1 F (36.7 C) 98.2 F (36.8 C)   TempSrc: Oral Oral Oral   SpO2: 99% 96% 98%   Weight:      Height:        General exam: Appears calm and comfortable.    The results of significant diagnostics from this hospitalization (including imaging, microbiology, ancillary and laboratory) are listed below for reference.     Procedures and Diagnostic Studies:   No results found.   Labs:   Basic Metabolic Panel: Recent Labs  Lab 12/08/18 1639 12/09/18 0429  NA 140 141  K 4.5 4.3  CL 106 106  CO2 26 28  GLUCOSE 97 92  BUN 30* 28*  CREATININE 1.59* 1.48*  CALCIUM 8.5* 8.4*   GFR Estimated Creatinine Clearance: 28.2 mL/min (A) (by C-G formula based on SCr of 1.48  mg/dL (H)). Liver Function Tests: Recent Labs  Lab 12/08/18 1639  AST 20  ALT 7  ALKPHOS 70  BILITOT 2.0*  PROT 6.8  ALBUMIN 3.7   No results for input(s): LIPASE, AMYLASE in the last 168 hours. No results for input(s): AMMONIA in the last 168 hours. Coagulation profile Recent Labs  Lab 12/08/18 1639  INR 1.28    CBC: Recent Labs  Lab 12/08/18 1639 12/08/18 2044 12/09/18 0429 12/09/18 1149  WBC 6.7  --   --   --   HGB 16.5 15.7 14.7 16.4  HCT 52.9* 50.0 46.6 51.4  MCV 101.3*  --   --   --   PLT 120*  --   --   --    Cardiac Enzymes: No results for input(s): CKTOTAL, CKMB, CKMBINDEX, TROPONINI in the last 168 hours. BNP: Invalid  input(s): POCBNP CBG: No results for input(s): GLUCAP in the last 168 hours. D-Dimer No results for input(s): DDIMER in the last 72 hours. Hgb A1c No results for input(s): HGBA1C in the last 72 hours. Lipid Profile No results for input(s): CHOL, HDL, LDLCALC, TRIG, CHOLHDL, LDLDIRECT in the last 72 hours. Thyroid function studies No results for input(s): TSH, T4TOTAL, T3FREE, THYROIDAB in the last 72 hours.  Invalid input(s): FREET3 Anemia work up No results for input(s): VITAMINB12, FOLATE, FERRITIN, TIBC, IRON, RETICCTPCT in the last 72 hours. Microbiology No results found for this or any previous visit (from the past 240 hour(s)).   Discharge Instructions:   Discharge Instructions    Diet - low sodium heart healthy   Complete by:  As directed    Discharge instructions   Complete by:  As directed    benefiber 1 tablespoon  2x/day   Increase activity slowly   Complete by:  As directed      Allergies as of 12/09/2018      Reactions   Lisinopril Cough      Medication List    STOP taking these medications   ALPRAZolam 1 MG tablet Commonly known as:  XANAX   diclofenac sodium 1 % Gel Commonly known as:  VOLTAREN   hydrocortisone 2.5 % rectal cream Commonly known as:  ANUSOL-HC   senna 8.6 MG Tabs tablet Commonly known as:  SENOKOT   traMADol 50 MG tablet Commonly known as:  ULTRAM     TAKE these medications   amiodarone 200 MG tablet Commonly known as:  PACERONE TAKE 1 TABLET ONCE DAILY.   apixaban 2.5 MG Tabs tablet Commonly known as:  ELIQUIS Take 1 tablet (2.5 mg total) by mouth 2 (two) times daily. Start taking on:  December 10, 2018   diltiazem 30 MG tablet Commonly known as:  CARDIZEM Take 1 tablet (30 mg total) by mouth 2 (two) times daily. What changed:  when to take this   finasteride 5 MG tablet Commonly known as:  PROSCAR Take 1 tablet (5 mg total) by mouth daily.   hydrocortisone 25 MG suppository Commonly known as:  ANUSOL-HC Place 1  suppository (25 mg total) rectally 2 (two) times daily.   lubiprostone 24 MCG capsule Commonly known as:  AMITIZA Take 1 capsules by mouth twice daily.   polyethylene glycol powder powder Commonly known as:  MIRALAX 1 capful BID   pravastatin 20 MG tablet Commonly known as:  PRAVACHOL TAKE 1 TABLET EACH DAY. What changed:  See the new instructions.   torsemide 10 MG tablet Commonly known as:  DEMADEX Take 1 tablet (10 mg total) by  mouth daily.   triamcinolone ointment 0.1 % Commonly known as:  KENALOG Apply 1 application topically 2 (two) times daily. To irritated skin   VENTOLIN HFA 108 (90 Base) MCG/ACT inhaler Generic drug:  albuterol Inhale 2 puffs into the lungs every 6 (six) hours as needed for wheezing or shortness of breath.      Follow-up Information    Esterwood, Amy S, PA-C Follow up on 12/16/2018.   Specialty:  Gastroenterology Why:  3:30 pm Contact information: [address] [place] [address] [phone]        Plotnikov, Evie Lacks, MD Follow up in 1 week(s).   Specialty:  Internal Medicine Contact information: [place] [address] [phone]            Time coordinating discharge: 25 min  Signed:  Geradine Girt DO  [facility] 12/09/2018, 1:18 PM

## 2018-12-09 NOTE — Consult Note (Addendum)
Referring Provider: Dr. Dene Gentry Primary Care Physician:  Alain Marion Evie Lacks, MD Primary Gastroenterologist:  Dr. Zenovia Jarred  Reason for Consultation: rectal bleeding  HPI: [name] [name] is a 85 y.o. male with a history of HTN, CAD, atrial fibrillation on Eliquis, cardiomyopathy (EF20-25%), chronic constipation, renal insufficiency,  arthritis, pyelonephritis, anxiety and depression. He reported having worsening constipation, no BM for 4 days. He was taking Amitiza 46mcg bid since 12/01/2018 as prescribed by Amy Esterwood PA-C.  He developed abrupt bright red rectal bleeding per the rectum on 12/08/2018. No associated straining or rectal pain. No abdominal pain. His last colonoscopy was more than 10 years ago.   ED Course: He remained hemodynamically stable. CBC with Hg 15.7. HCT 50. INR 1.28. PT 15.9.    Past Medical History:  Diagnosis Date  . Anxiety   . Arthritis    "shoulders" (05/28/2016)  . Cardiomyopathy   . Coronary artery disease   . Depression    hx (05/28/2016)  . Hemorrhoids   . Hyperlipidemia   . Hypertension   . Paroxysmal atrial fibrillation (HCC)   . Skipped heart beats     Past Surgical History:  Procedure Laterality Date  . APPENDECTOMY    . CARDIOVERSION N/A 06/04/2016   Procedure: CARDIOVERSION;  Surgeon: Pixie Casino, MD;  Location: [facility] ENDOSCOPY;  Service: Cardiovascular;  Laterality: N/A;  . CARDIOVERSION N/A 04/09/2017   Procedure: CARDIOVERSION;  Surgeon: Thayer Headings, MD;  Location: [facility] ENDOSCOPY;  Service: Cardiovascular;  Laterality: N/A;  . CARDIOVERSION N/A 04/01/2018   Procedure: CARDIOVERSION;  Surgeon: Skeet Latch, MD;  Location: [place];  Service: Cardiovascular;  Laterality: N/A;  . CATARACT EXTRACTION Left   . CHOLECYSTECTOMY OPEN    . COLONOSCOPY W/ BIOPSIES AND POLYPECTOMY    . INGUINAL HERNIA REPAIR Bilateral   . MYRINGOTOMY WITH TUBE PLACEMENT Bilateral   . skin cancer removal Right    side of nose by Rt eye  . TEE  WITHOUT CARDIOVERSION N/A 06/04/2016   Procedure: TRANSESOPHAGEAL ECHOCARDIOGRAM (TEE);  Surgeon: Pixie Casino, MD;  Location: [facility] ENDOSCOPY;  Service: Cardiovascular;  Laterality: N/A;  . TEE WITHOUT CARDIOVERSION N/A 04/01/2018   Procedure: TRANSESOPHAGEAL ECHOCARDIOGRAM (TEE);  Surgeon: Skeet Latch, MD;  Location: [place];  Service: Cardiovascular;  Laterality: N/A;  . TONSILLECTOMY AND ADENOIDECTOMY      Prior to Admission medications   Medication Sig Start Date End Date Taking? Authorizing Provider  amiodarone (PACERONE) 200 MG tablet TAKE 1 TABLET ONCE DAILY. Patient taking differently: Take 200 mg by mouth daily.  08/12/18  Yes Plotnikov, Evie Lacks, MD  apixaban (ELIQUIS) 2.5 MG TABS tablet Take 1 tablet (2.5 mg total) by mouth 2 (two) times daily. 01/14/18  Yes Minus Breeding, MD  diltiazem (CARDIZEM) 30 MG tablet Take 1 tablet (30 mg total) by mouth 2 (two) times daily. Patient taking differently: Take 30 mg by mouth daily.  08/10/18  Yes Plotnikov, Evie Lacks, MD  finasteride (PROSCAR) 5 MG tablet Take 1 tablet (5 mg total) by mouth daily. 08/10/18  Yes Plotnikov, Evie Lacks, MD  lubiprostone (AMITIZA) 24 MCG capsule Take 1 capsules by mouth twice daily. 11/19/18  Yes Esterwood, Amy S, PA-C  pravastatin (PRAVACHOL) 20 MG tablet TAKE 1 TABLET EACH DAY. Patient taking differently: Take 20 mg by mouth daily.  04/09/18  Yes Plotnikov, Evie Lacks, MD  torsemide (DEMADEX) 10 MG tablet Take 1 tablet (10 mg total) by mouth daily. 09/30/18  Yes Plotnikov, Evie Lacks, MD  triamcinolone ointment (  KENALOG) 0.1 % Apply 1 application topically 2 (two) times daily. To irritated skin 12/03/18  Yes Plotnikov, Evie Lacks, MD  ALPRAZolam (XANAX) 1 MG tablet TAKE ONE TABLET AT BEDTIME. Patient not taking: Reported on 12/08/2018 11/30/18   Plotnikov, Evie Lacks, MD  diclofenac sodium (VOLTAREN) 1 % GEL Apply 2 g topically 4 (four) times daily. Patient not taking: Reported on 12/08/2018 10/17/18   Mariel Aloe, MD  hydrocortisone (ANUSOL-HC) 2.5 % rectal cream Place 1 application rectally 2 (two) times daily. Patient not taking: Reported on 12/08/2018 11/12/18   Hoyt Koch, MD  senna (SENOKOT) 8.6 MG TABS tablet Take 1-2 tablets (8.6-17.2 mg total) by mouth at bedtime. Patient not taking: Reported on 12/08/2018 08/10/18   Plotnikov, Evie Lacks, MD  traMADol (ULTRAM) 50 MG tablet Take 1 tablet (50 mg total) by mouth every 12 (twelve) hours as needed for moderate pain or severe pain. Patient not taking: Reported on 12/08/2018 10/17/18   Mariel Aloe, MD  VENTOLIN HFA 108 [phone] Base) MCG/ACT inhaler Inhale 2 puffs into the lungs every 6 (six) hours as needed for wheezing or shortness of breath.  03/19/18   [provider]    Current Facility-Administered Medications  Medication Dose Route Frequency Provider Last Rate Last Dose  . amiodarone (PACERONE) tablet 200 mg  200 mg Oral Daily Crosley, Debby, MD   200 mg at 12/09/18 1025  . diltiazem (CARDIZEM) tablet 30 mg  30 mg Oral BID Claria Dice, Debby, MD   30 mg at 12/09/18 1025  . finasteride (PROSCAR) tablet 5 mg  5 mg Oral Daily Crosley, Debby, MD   5 mg at 12/09/18 1025  . hydrocortisone (ANUSOL-HC) suppository 25 mg  25 mg Rectal BID Carl Best M, NP      . torsemide [facility]) tablet 10 mg  10 mg Oral Daily Crosley, Debby, MD   10 mg at 12/09/18 1025    Allergies as of 12/08/2018 - Review Complete 12/08/2018  Allergen Reaction Noted  . Lisinopril Cough 03/02/2012    Family History  Problem Relation Age of Onset  . Heart disease Brother     Social History   Socioeconomic History  . Marital status: Widowed    Spouse name: Not on file  . Number of children: 1  . Years of education: Not on file  . Highest education level: Not on file  Occupational History  . Occupation: Retired  Scientific laboratory technician  . Financial resource strain: Not on file  . Food insecurity:    Worry: Not on file    Inability: Not on file  .  Transportation needs:    Medical: Not on file    Non-medical: Not on file  Tobacco Use  . Smoking status: Never Smoker  . Smokeless tobacco: Never Used  Substance and Sexual Activity  . Alcohol use: No  . Drug use: No  . Sexual activity: Yes  Lifestyle  . Physical activity:    Days per week: Not on file    Minutes per session: Not on file  . Stress: Not on file  Relationships  . Social connections:    Talks on phone: Not on file    Gets together: Not on file    Attends religious service: Not on file    Active member of club or organization: Not on file    Attends meetings of clubs or organizations: Not on file    Relationship status: Not on file  . Intimate partner violence:  Fear of current or ex partner: Not on file    Emotionally abused: Not on file    Physically abused: Not on file    Forced sexual activity: Not on file  Other Topics Concern  . Not on file  Social History Narrative  . Not on file    Review of Systems: Gen: Denies any fever, chills, sweats, anorexia, fatigue, weakness, malaise, weight loss, and sleep disorder CV: Denies chest pain, angina, palpitations, syncope, orthopnea, PND, peripheral edema, and claudication. Resp: Denies dyspnea at rest, dyspnea with exercise, cough, sputum, wheezing, coughing up blood, and pleurisy. GI: See HPI. GU : Denies urinary burning, blood in urine, urinary frequency, urinary hesitancy, nocturnal urination, and urinary incontinence. MS: Denies joint pain, limitation of movement, and swelling, stiffness, low back pain, extremity pain. Denies muscle weakness, cramps, atrophy.  Neuro:  Denies any headaches, dizziness, paresthesias. Endo:  Denies any problems with DM, thyroid, adrenal function.  Physical Exam: Vital signs in last 24 hours: Temp:  [97.5 F (36.4 C)-98.2 F (36.8 C)] 98.2 F (36.8 C) (01/22 1000) Pulse Rate:  [72-111] 88 (01/22 1028) Resp:  [16-26] 18 (01/22 0554) BP: (111-139)/(48-108) 121/88 (01/22  1000) SpO2:  [96 %-100 %] 98 % (01/22 1000) Weight:  [79.4 kg-81 kg] 79.4 kg (01/21 1507) Last BM Date: 12/04/18 General:   Alert,  Well-developed, well-nourished, pleasant and cooperative in NAD Lungs:  Clear throughout to auscultation.   No wheezes, crackles, or rhonchi. Heart:  Regular rate and rhythm; no murmurs, clicks, rubs,  or gallops. Abdomen:  Soft,nontender, BS active,nonpalp mass or hsm.   Rectal:  Deferred  Msk:  Symmetrical without gross deformities. . Pulses:  Normal pulses noted. Extremities:  Without clubbing or edema. Neurologic:  Alert and  oriented x4;  grossly normal neurologically. Skin:  Intact without significant lesions or rashes.. Psych:  Alert and cooperative. Normal mood and affect.  Intake/Output from previous day: 01/21 0701 - 01/22 0700 In: 456.1 [I.V.:456.1] Out: 550 [Urine:550] Intake/Output this shift: No intake/output data recorded.  Lab Results: Recent Labs    12/08/18 1639 12/08/18 2044 12/09/18 0429  WBC 6.7  --   --   HGB 16.5 15.7 14.7  HCT 52.9* 50.0 46.6  PLT 120*  --   --    BMET Recent Labs    12/08/18 1639 12/09/18 0429  NA 140 141  K 4.5 4.3  CL 106 106  CO2 26 28  GLUCOSE 97 92  BUN 30* 28*  CREATININE 1.59* 1.48*  CALCIUM 8.5* 8.4*   LFT Recent Labs    12/08/18 1639  PROT 6.8  ALBUMIN 3.7  AST 20  ALT 7  ALKPHOS 70  BILITOT 2.0*   PT/INR Recent Labs    12/08/18 1639  LABPROT 15.9*  INR 1.28    IMPRESSION:  1. Rectal bleeding secondary to internal hemorrhoids (on Eliquis for atrial fibrillation) 2. Internal hemorrhoids, no active bleeding per anoscopy exam today 3. Chronic constipation 4. Atrial fibrillation on Eliquis   PLAN: 1. Anoscopy at bedside identified large internal hemorrhoids, likely source of rectal bleeding 2.  Hydrocortisone 25mg  suppository PR bid x 10  days then as needed. 3.  Miralax 1 capful mixed in 8 ounces of water bid. 4.  Benefiber 1 tablespoon bid. 5.  Heart Healthy  diet. 6.  Restart Eliquis 12/10/2018 if H/H remains stable, if no further rectal bleeding occurs      (Today Hg 14.7. HCT 46.6).  7.  No further invasive evaluation unless active GI  bleeding develops 7.  GI office follow up listed in discharge information   Noralyn Pick  12/09/2018, 11:29 AM    Attending physician's note   I have taken a history, examined the patient and reviewed the chart. I agree with the Advanced Practitioner's note, impression and recommendations. 85 year old male with history of A. fib on Eliquis, CHF with hematochezia.  Large right posterior internal hemorrhoid with erythema and stigmata of recent bleeding on anoscopy.  No active bleeding.  No anal fissure or ulceration in the anal canal.  No palpable nodule on digital exam.  Hemodynamically stable with no significant drop in hemoglobin  Start Benefiber 1 tablespoon 2-3 times daily Continue Amitiza twice daily Start MiraLAX 1 capful daily, titrate up if needed to have daily soft bowel movement Okay to restart Eliquis tomorrow if no further bleeding Will arrange for follow-up in GI office within next 2 weeks Okay to discharge home from GI standpoint   K. Denzil Magnuson , MD [phone]

## 2018-12-09 NOTE — Care Management Note (Signed)
Case Management Note  Patient Details  Name: [name] [name] MRN: [id number] Date of Birth: 1920-06-25  Subjective/Objective: Acute GIB. From home. Alone. Patient states he has a HHRN from [place] week-meds. Patient has rw. Has own transp. No further CM needs.                   Action/Plan:d/c home w/HHC.   Expected Discharge Date:  (unknown)               Expected Discharge Plan:  [place]  In-House Referral:     Discharge planning Services  CM Consult  Post Acute Care Choice:  Home Health(Active w/[facility]-HHRN-1x week-meds.) Choice offered to:  Patient  DME Arranged:    DME Agency:     HH Arranged:  RN [place] Agency:  Psychologist, prison and probation services)  Status of Service:  Completed, signed off  If discussed at [place] of Stay Meetings, dates discussed:    Additional Comments:  Dessa Phi, RN 12/09/2018, 12:01 PM

## 2018-12-09 NOTE — Care Management Obs Status (Signed)
[place] NOTIFICATION   Patient Details  Name: [name] [name] MRN: [id number] Date of Birth: 26-Aug-1920   Medicare Observation Status Notification Given:  Yes    MahabirJuliann Pulse, RN 12/09/2018, 1:47 PM

## 2018-12-09 NOTE — Telephone Encounter (Signed)
Patient was admitted

## 2018-12-09 NOTE — Care Management CC44 (Signed)
Condition Code 44 Documentation Completed  Patient Details  Name: Devarion Mcclanahan MRN: [id number] Date of Birth: 1920-09-18   Condition Code 44 given:  Yes Patient signature on Condition Code 44 notice:  Yes Documentation of 2 MD's agreement:  Yes Code 44 added to claim:  Yes    Dessa Phi, RN 12/09/2018, 1:48 PM

## 2018-12-09 NOTE — Progress Notes (Signed)
Pt alert and oriented, still no bowel movement at time of discharge.  Pt was given d/c instructions and d/cd home.

## 2018-12-09 NOTE — Progress Notes (Signed)
   12/09/18 1348  MEWS Assessment  Is this an acute change? No  Pt has a-fib and his heart rate flucuates

## 2018-12-10 ENCOUNTER — Telehealth: Payer: Self-pay | Admitting: *Deleted

## 2018-12-10 LAB — CEA: CEA: 4.3 ng/mL (ref 0.0–4.7)

## 2018-12-10 NOTE — Telephone Encounter (Signed)
Transition Care Management Follow-up Telephone Call   Date discharged? 12/09/18   How have you been since you were released from the hospital? Pt states he is doing alright   Do you understand why you were in the hospital? YES   Do you understand the discharge instructions? YES   Where were you discharged to? Home   Items Reviewed:  Medications reviewed: YES, pt states not taking eliquis anymore  Allergies reviewed: YES  Dietary changes reviewed:YES  Referrals reviewed: YES, Will follow-up w/GI on 12/16/18   Functional Questionnaire:   Activities of Daily Living (ADLs):   He states he are independent in the following: bathing and hygiene, feeding, continence, grooming, toileting and dressing States they require assistance with the following: ambulation   Any transportation issues/concerns?: NO   Any patient concerns? NO   Confirmed importance and date/time of follow-up visits scheduled YES, appt 12/15/18  Provider Appointment booked with Dr. Alain Marion  Confirmed with patient if condition begins to worsen call PCP or go to the ER.  Patient was given the office number and encouraged to call back with question or concerns.  : YES

## 2018-12-11 ENCOUNTER — Ambulatory Visit: Payer: Self-pay

## 2018-12-11 ENCOUNTER — Telehealth: Payer: Self-pay | Admitting: Internal Medicine

## 2018-12-11 NOTE — Telephone Encounter (Signed)
Copied from [place]. Topic: Quick Communication - Rx Refill/Question >> Dec 11, 2018  9:02 AM Scherrie Gerlach wrote: Medication:  cough med with codeine Pt discharged from the hospital 2 days ago and states he now has a bad cough.  Pt hopes the dr will send this in.  Pt states this is the only thing that helps him, "with the codeine".  But pt was not in the hospital for anything cough related he says Pt has appt on Tues 12/15/2018. Advised he may need to be seen first, but he hopes not. [place], [place] [phone] (Phone) [phone] (Fax)

## 2018-12-11 NOTE — Telephone Encounter (Signed)
Pt requesting cough medicine.- promethazine codeine for cough. Pt with mostly dry cough with occasional gray phlegm. Pt stated that he does not get around easy and hoping Dr Alain Marion will call in medication. Pt having occasional wheeze. Advised pt that he needs to come in for an office visit pt pt refused. Pt denies shortness of breath or fever. Pt has had cough since Monday. Care advice given and pt verbalized understanding.   Reason for Disposition . [1] Coughed up blood AND [2] > 1 tablespoon (15 ml) (Exception: blood-tinged sputum)  Answer Assessment - Initial Assessment Questions 1. ONSET: "When did the cough begin?"      Monday 2. SEVERITY: "How bad is the cough today?"      Coughing spells no SOB 3. RESPIRATORY DISTRESS: "Describe your breathing."      Not SOB, wheezing 4. FEVER: "Do you have a fever?" If so, ask: "What is your temperature, how was it measured, and when did it start?"     no 5. SPUTUM: "Describe the color of your sputum" (clear, white, yellow, green)     Gray occasionally 6. HEMOPTYSIS: "Are you coughing up any blood?" If so ask: "How much?" (flecks, streaks, tablespoons, etc.)     no 7. CARDIAC HISTORY: "Do you have any history of heart disease?" (e.g., heart attack, congestive heart failure)      Atrial fibrillation 8. LUNG HISTORY: "Do you have any history of lung disease?"  (e.g., pulmonary embolus, asthma, emphysema)     no 9. PE RISK FACTORS: "Do you have a history of blood clots?" (or: recent major surgery, recent prolonged travel, bedridden)     no 10. OTHER SYMPTOMS: "Do you have any other symptoms?" (e.g., runny nose, wheezing, chest pain)       Runny nose, wheezing occasionally  11. PREGNANCY: "Is there any chance you are pregnant?" "When was your last menstrual period?"       n/a 12. TRAVEL: "Have you traveled out of the country in the last month?" (e.g., travel history, exposures)       no  Protocols used: [place]

## 2018-12-11 NOTE — Telephone Encounter (Signed)
See triage note.

## 2018-12-11 NOTE — Telephone Encounter (Signed)
See other TE.

## 2018-12-13 NOTE — Telephone Encounter (Addendum)
Use OTC robitussin - pt is unsteady: will not call in Codeine syr. Pt was seen today

## 2018-12-15 ENCOUNTER — Encounter: Payer: Self-pay | Admitting: Internal Medicine

## 2018-12-15 ENCOUNTER — Ambulatory Visit (INDEPENDENT_AMBULATORY_CARE_PROVIDER_SITE_OTHER): Payer: Medicare Other | Admitting: Internal Medicine

## 2018-12-15 VITALS — BP 124/76 | HR 53 | Temp 98.0°F | Ht 68.0 in | Wt 174.0 lb

## 2018-12-15 DIAGNOSIS — F341 Dysthymic disorder: Secondary | ICD-10-CM | POA: Diagnosis not present

## 2018-12-15 DIAGNOSIS — I482 Chronic atrial fibrillation, unspecified: Secondary | ICD-10-CM

## 2018-12-15 DIAGNOSIS — K922 Gastrointestinal hemorrhage, unspecified: Secondary | ICD-10-CM

## 2018-12-15 DIAGNOSIS — K5900 Constipation, unspecified: Secondary | ICD-10-CM

## 2018-12-15 DIAGNOSIS — W19XXXA Unspecified fall, initial encounter: Secondary | ICD-10-CM

## 2018-12-15 DIAGNOSIS — R627 Adult failure to thrive: Secondary | ICD-10-CM | POA: Diagnosis not present

## 2018-12-15 DIAGNOSIS — F419 Anxiety disorder, unspecified: Secondary | ICD-10-CM

## 2018-12-15 MED ORDER — POLYETHYLENE GLYCOL 3350 17 GM/SCOOP PO POWD: ORAL | 0 refills | Status: AC

## 2018-12-15 NOTE — Assessment & Plan Note (Signed)
No blood in stool lately. No BM in 3 d Take Miralax bid-tid See Amy tomorrow

## 2018-12-15 NOTE — Assessment & Plan Note (Signed)
Off Xanax 

## 2018-12-15 NOTE — Assessment & Plan Note (Signed)
Eliquis 

## 2018-12-15 NOTE — Assessment & Plan Note (Signed)
Chronic  Xanax prn  Potential benefits of a long term benzodiazepines  use as well as potential risks  and complications were explained to the patient and were aknowledged. 

## 2018-12-15 NOTE — Assessment & Plan Note (Signed)
S/p GI eval On Eliquis

## 2018-12-15 NOTE — Assessment & Plan Note (Addendum)
Advised to check out [place], [facility] etc Jordanny refused Palliative Care consult  Wt Readings from Last 3 Encounters:  12/15/18 174 lb (78.9 kg)  12/08/18 175 lb (79.4 kg)  12/03/18 179 lb (81.2 kg)

## 2018-12-15 NOTE — Progress Notes (Signed)
Subjective:  Patient ID: [name] [name], male    DOB: Aug 15, 1920  Age: 85 y.o. MRN: [id number]  CC: No chief complaint on file.   HPI [name] [name] presents for a post-hosp f/u for GI bleed - d/c'd on 1/22. Comes w/a neighbor. C/o weakness. Cont w/constipation. No blood in stool lately. No BM in 3 d [facility] RN - weekly  Per Hx: 1. Outpatient GI follow up 2. Resume home health RN   Discharge Diagnosis:   Active Problems:   Dyslipidemia   ANXIETY DEPRESSION   BPH (benign prostatic hyperplasia)   CKD (chronic kidney disease) stage 3, GFR 30-59 ml/min (HCC)   Congestive dilated cardiomyopathy (HCC)   Systolic CHF, chronic (HCC)   Chronic anticoagulation   Chronic atrial fibrillation   Acute GI bleeding   GI bleed    Discharge Condition: Improved.  Diet recommendation: Low sodium, heart healthy.  Carbohydrate-modified.  Regular.  Wound care: None.  Code status: Full.   History of Present Illness:   52 y/omalewho lives at home, hepresents with c/o GI bleed. He noted rectal bleeding today. His visiting nurse stopped by today. He was sitting in his chair and had sudden onset of rectal bleeding. It was BRBPR. He has atrial fibrillation and is on eliquis. His last colonoscopy was along time agolikely greater than 10. He has had hemorrhoidal bleedin the pastbefore but never this bad. The bleeding eventually stopped by itself. It was a significant bleed. He believes he was bleeding for prob 30 min with free flow. He states it even started puddling under his feet at one point. 911 was called and he was brought to the ER.   Hospital Course by Problem:  Per GI:  Large right posterior internal hemorrhoid with erythema and stigmata of recent bleeding on anoscopy.No active bleeding.No anal fissure or ulceration in the anal canal.No palpable nodule on digital exam.  Hemodynamically stable with no significant drop in hemoglobin  Start Benefiber 1 tablespoon  2-3 times daily Continue Amitiza twice daily Start MiraLAX 1 capful daily, titrate up if needed to have daily soft bowel movement Okay to restart Eliquis tomorrow if no further bleeding Will arrange for follow-up in GI office within next 2 weeks Okay to discharge home from GI standpoint    Outpatient Medications Prior to Visit  Medication Sig Dispense Refill  . amiodarone (PACERONE) 200 MG tablet TAKE 1 TABLET ONCE DAILY. (Patient taking differently: Take 200 mg by mouth daily. ) 30 tablet 5  . apixaban (ELIQUIS) 2.5 MG TABS tablet Take 1 tablet (2.5 mg total) by mouth 2 (two) times daily. 60 tablet 11  . diltiazem (CARDIZEM) 30 MG tablet Take 1 tablet (30 mg total) by mouth 2 (two) times daily. (Patient taking differently: Take 30 mg by mouth daily. ) 180 tablet 3  . finasteride (PROSCAR) 5 MG tablet Take 1 tablet (5 mg total) by mouth daily. 90 tablet 3  . hydrocortisone (ANUSOL-HC) 25 MG suppository Place 1 suppository (25 mg total) rectally 2 (two) times daily. 20 suppository 0  . lubiprostone (AMITIZA) 24 MCG capsule Take 1 capsules by mouth twice daily. 60 capsule 3  . polyethylene glycol powder (MIRALAX) powder 1 capful BID 255 g 0  . pravastatin (PRAVACHOL) 20 MG tablet TAKE 1 TABLET EACH DAY. (Patient taking differently: Take 20 mg by mouth daily. ) 30 tablet 11  . torsemide (DEMADEX) 10 MG tablet Take 1 tablet (10 mg total) by mouth daily. 30 tablet 11  . triamcinolone ointment (KENALOG) 0.1 %  Apply 1 application topically 2 (two) times daily. To irritated skin 80 g 3  . VENTOLIN HFA 108 (90 Base) MCG/ACT inhaler Inhale 2 puffs into the lungs every 6 (six) hours as needed for wheezing or shortness of breath.   0   No facility-administered medications prior to visit.     ROS: Review of Systems  Constitutional: Positive for fatigue. Negative for appetite change and unexpected weight change.  HENT: Negative for congestion, nosebleeds, sneezing, sore throat and trouble swallowing.    Eyes: Negative for itching and visual disturbance.  Respiratory: Negative for cough.   Cardiovascular: Negative for chest pain, palpitations and leg swelling.  Gastrointestinal: Positive for constipation. Negative for abdominal distention, blood in stool, diarrhea and nausea.  Genitourinary: Negative for frequency and hematuria.  Musculoskeletal: Positive for back pain and gait problem. Negative for joint swelling and neck pain.  Skin: Negative for rash.  Neurological: Negative for dizziness, tremors, speech difficulty and weakness.  Psychiatric/Behavioral: Negative for agitation, dysphoric mood and sleep disturbance. The patient is nervous/anxious.     Objective:  BP 124/76 (BP Location: Left Arm, Patient Position: Sitting, Cuff Size: Normal)   Pulse (!) 53   Temp 98 F (36.7 C) (Oral)   Ht 5\' 8"  (1.727 m)   Wt 174 lb (78.9 kg)   SpO2 93%   BMI 26.46 kg/m   BP Readings from Last 3 Encounters:  12/15/18 124/76  12/09/18 107/65  12/03/18 124/78    Wt Readings from Last 3 Encounters:  12/15/18 174 lb (78.9 kg)  12/08/18 175 lb (79.4 kg)  12/03/18 179 lb (81.2 kg)    Physical Exam Constitutional:      General: He is not in acute distress.    Appearance: He is well-developed.     Comments: NAD  Eyes:     Conjunctiva/sclera: Conjunctivae normal.     Pupils: Pupils are equal, round, and reactive to light.  Neck:     Musculoskeletal: Normal range of motion.     Thyroid: No thyromegaly.     Vascular: No JVD.  Cardiovascular:     Rate and Rhythm: Normal rate and regular rhythm.     Heart sounds: Murmur present. No friction rub. No gallop.   Pulmonary:     Effort: Pulmonary effort is normal. No respiratory distress.     Breath sounds: Normal breath sounds. No wheezing or rales.  Chest:     Chest wall: No tenderness.  Abdominal:     General: Bowel sounds are normal. There is no distension.     Palpations: Abdomen is soft. There is no mass.     Tenderness: There is no  abdominal tenderness. There is no guarding or rebound.  Musculoskeletal: Normal range of motion.        General: No tenderness.  Lymphadenopathy:     Cervical: No cervical adenopathy.  Skin:    General: Skin is warm and dry.     Findings: No rash.  Neurological:     Mental Status: He is alert and oriented to person, place, and time.     Cranial Nerves: No cranial nerve deficit.     Motor: No abnormal muscle tone.     Coordination: Coordination abnormal.     Gait: Gait normal.     Deep Tendon Reflexes: Reflexes are normal and symmetric.  Psychiatric:        Behavior: Behavior normal.        Thought Content: Thought content normal.  Judgment: Judgment normal.   walker Weak unsteady  Lab Results  Component Value Date   WBC 6.7 12/08/2018   HGB 16.4 12/09/2018   HCT 51.4 12/09/2018   PLT 120 (L) 12/08/2018   GLUCOSE 92 12/09/2018   CHOL 112 03/30/2018   TRIG 80 03/30/2018   HDL 39 (L) 03/30/2018   LDLCALC 57 03/30/2018   ALT 7 12/08/2018   AST 20 12/08/2018   NA 141 12/09/2018   K 4.3 12/09/2018   CL 106 12/09/2018   CREATININE 1.48 (H) 12/09/2018   BUN 28 (H) 12/09/2018   CO2 28 12/09/2018   TSH 1.681 03/30/2018   PSA 0.24 03/08/2014   INR 1.28 12/08/2018   HGBA1C 5.7 (H) 03/31/2018    No results found.  Assessment & Plan:   There are no diagnoses linked to this encounter.   No orders of the defined types were placed in this encounter.    Follow-up: No follow-ups on file.  Walker Kehr, MD

## 2018-12-16 ENCOUNTER — Encounter: Payer: Self-pay | Admitting: Physician Assistant

## 2018-12-16 ENCOUNTER — Ambulatory Visit: Payer: Medicare Other | Admitting: Physician Assistant

## 2018-12-16 VITALS — BP 122/80 | HR 84 | Ht 67.0 in | Wt 173.4 lb

## 2018-12-16 DIAGNOSIS — K5909 Other constipation: Secondary | ICD-10-CM

## 2018-12-16 DIAGNOSIS — K648 Other hemorrhoids: Secondary | ICD-10-CM

## 2018-12-16 NOTE — Patient Instructions (Signed)
Restart Senekot 2 at bedtime.  Continue Miralax 17 grams in 8 oz of water, fluid- do  2 doses daily. Tomorrow take a 3rd dose in the afternoon if needed.   Continue Amitiza 24 mcg twice daily. Continue Benefiber twice daily.   Normal BMI (Body Mass Index- based on height and weight) is between 23 and 30. Your BMI today is Body mass index is 27.15 kg/m. Marland Kitchen Please consider follow up  regarding your BMI with your Primary Care Provider.
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
## 2018-12-17 ENCOUNTER — Encounter: Payer: Self-pay | Admitting: Cardiology

## 2018-12-17 ENCOUNTER — Ambulatory Visit: Payer: Medicare Other | Admitting: Cardiology

## 2018-12-17 ENCOUNTER — Encounter: Payer: Self-pay | Admitting: Physician Assistant

## 2018-12-17 ENCOUNTER — Telehealth: Payer: Self-pay | Admitting: Physician Assistant

## 2018-12-17 VITALS — BP 104/63 | HR 51 | Ht 67.0 in | Wt 176.4 lb

## 2018-12-17 DIAGNOSIS — N183 Chronic kidney disease, stage 3 unspecified: Secondary | ICD-10-CM

## 2018-12-17 DIAGNOSIS — I4819 Other persistent atrial fibrillation: Secondary | ICD-10-CM

## 2018-12-17 DIAGNOSIS — I10 Essential (primary) hypertension: Secondary | ICD-10-CM | POA: Diagnosis not present
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
## 2018-12-17 NOTE — Patient Instructions (Signed)
Medication Instructions:  Continue current medications  If you need a refill on your cardiac medications before your next appointment, please call your pharmacy.  Labwork: None Ordered   Take the provided lab slips with you to the lab for your blood draw.   When you have your labs (blood work) drawn today and your tests are completely normal, you will receive your results only by MyChart Message (if you have MyChart) -OR-  A paper copy in the mail.  If you have any lab test that is abnormal or we need to change your treatment, we will call you to review these results.  Testing/Procedures: None Ordered  Follow-Up: . Your physician recommends that you schedule a follow-up appointment in: 3 Months with Kerin Ransom . Your physician recommends that you schedule a follow-up appointment in: 6 Months with Dr Percival Spanish   At [facility], you and your health needs are our priority.  As part of our continuing mission to provide you with exceptional heart care, we have created designated Provider Care Teams.  These Care Teams include your primary Cardiologist (physician) and Advanced Practice Providers (APPs -  Physician Assistants and Nurse Practitioners) who all work together to provide you with the care you need, when you need it.  Thank you for choosing [facility] at [facility]!!

## 2018-12-17 NOTE — Telephone Encounter (Signed)
Pt had OV 1.29.20.  Pt reported that he is still having chronic constipation - no BM in 4 days and that the bleeding has stopped.

## 2018-12-21 NOTE — Telephone Encounter (Signed)
That is fine - I just don'tt want pt confused about what he is taking because the meds keep being adjusted .Marland Kitchen..is she seeing him daily ?

## 2018-12-21 NOTE — Telephone Encounter (Signed)
Ok , they can try dosing the Linzess QOD,pick a  Steady dose of senekot for bedtime,and continue Miralax daily -skip if diarrhea

## 2018-12-21 NOTE — Telephone Encounter (Signed)
Done

## 2018-12-21 NOTE — Telephone Encounter (Signed)
She comes to him weekly and fills his pill box. It is a program through his insurance.

## 2018-12-21 NOTE — Telephone Encounter (Signed)
[name] [name] is there with the patient. He reports a "good and firm bowel movement."  She reports he has shown her his underwear from yesterday. She does see blood in the underwear but no blood clots. She thinks it may be from the bowel movement. The patient is unable to insert the hemorrhoidal cream or suppository into his rectum. Today she has inserted the suppository for him. She is going to show him how to use a Tucks wipe to apply the cream. Ms.Martin also notes from her conversation with the patient, he develops diarrhea after a few days on Linzess. She would like to try every other day dosing. Senekot 1 to 2 daily adjusting as he needs to. Miralax daily unless diarrhea, then skip that day.  Please advise on this.

## 2018-12-21 NOTE — Telephone Encounter (Signed)
Enis Gash NP for pt called stating that patient is still having bleeding, she would like to speak with someone about pt. Her phone is [phone].

## 2018-12-22 ENCOUNTER — Telehealth: Payer: Self-pay | Admitting: Internal Medicine

## 2018-12-22 DIAGNOSIS — R627 Adult failure to thrive: Secondary | ICD-10-CM

## 2018-12-22 DIAGNOSIS — W19XXXA Unspecified fall, initial encounter: Secondary | ICD-10-CM

## 2018-12-22 NOTE — Telephone Encounter (Signed)
[name], Please iask the patient to take Senakot 2 tabs bid Thanks, AP      From: Minus Breeding, MD Sent: 12/17/2018   1:29 PM EST To: Cassandria Anger, MD  Alex Mr. Rip Harbour has not had a bowel movement in 4 days and I am wondering if there is anything else we can do for him.  He says he can keep the suppository in long enough for it to do him any good.

## 2018-12-22 NOTE — Telephone Encounter (Signed)
Okay to place orders 

## 2018-12-22 NOTE — Telephone Encounter (Signed)
Copied from [place] [id number]. Topic: General - Other >> Dec 21, 2018  4:04 PM Leward Quan A wrote: Reason for CRM: Loura Halt nurse care manager with [employer] called to ask if patient can be set up for [facility], PT and OT. She would like for some one to come out and evaluate patient so that he can receive these services. Requesting a call back with some information please.   Ph# [phone]

## 2018-12-22 NOTE — Telephone Encounter (Signed)
Ok Thx 

## 2018-12-24 NOTE — Telephone Encounter (Signed)
Pt notified and would like to know what to do about the rectal bleeding. He is using preparation h

## 2018-12-24 NOTE — Addendum Note (Signed)
Addended by: Karren Cobble on: 12/24/2018 03:34 PM   Modules accepted: Orders

## 2018-12-25 ENCOUNTER — Telehealth: Payer: Self-pay | Admitting: Physician Assistant

## 2018-12-25 NOTE — Telephone Encounter (Signed)
I agree w/Prearation H Thx

## 2018-12-25 NOTE — Telephone Encounter (Signed)
[facility] called advised that the PT is very Critical and would like for someone to call her back.. betty [phone]

## 2018-12-25 NOTE — Telephone Encounter (Signed)
Patient advised of dr plotnikov's note/instructions---he has repeated back for understanding

## 2018-12-25 NOTE — Telephone Encounter (Signed)
[facility] has not ordered any recent labs on this patient. Most recent labs in his chart are from his hospitalization and are dated 12/08/2018. Called back. No answer. No identifier on the voicemail. Left message with my name and phone number.

## 2018-12-28 ENCOUNTER — Encounter: Payer: Self-pay | Admitting: Internal Medicine

## 2018-12-28 ENCOUNTER — Ambulatory Visit: Payer: Medicare Other | Admitting: Internal Medicine

## 2018-12-28 ENCOUNTER — Telehealth: Payer: Self-pay | Admitting: *Deleted

## 2018-12-28 VITALS — BP 106/54 | HR 72 | Ht 67.0 in | Wt 173.0 lb

## 2018-12-28 DIAGNOSIS — R194 Change in bowel habit: Secondary | ICD-10-CM | POA: Diagnosis not present

## 2018-12-28 DIAGNOSIS — K5909 Other constipation: Secondary | ICD-10-CM | POA: Diagnosis not present

## 2018-12-28 DIAGNOSIS — K625 Hemorrhage of anus and rectum: Secondary | ICD-10-CM

## 2018-12-28 NOTE — Telephone Encounter (Signed)
Request for surgical clearance:     Endoscopy Procedure  What type of surgery is being performed?     Flexible sigmoidoscopy  When is this surgery scheduled?    01/01/2019  What type of clearance is required ?   Pharmacy  Are there any medications that need to be held prior to surgery and how long? Eliquis, 2 days  Practice name and name of physician performing surgery?      [facility]  What is your office phone and fax number?      Phone- [phone]  [phone]  Anesthesia type (None, local, MAC, general) ?       MAC

## 2018-12-28 NOTE — Patient Instructions (Signed)
You have been scheduled for a flexible sigmoidoscopy. Please follow the written instructions given to you at your visit today. If you use inhalers (even only as needed), please bring them with you on the day of your procedure.  Continue your current bowel regimen.  If you are age 83 or older, your body mass index should be between 23-30. Your Body mass index is 27.1 kg/m. If this is out of the aforementioned range listed, please consider follow up with your Primary Care Provider.  If you are age 1 or younger, your body mass index should be between 19-25. Your Body mass index is 27.1 kg/m. If this is out of the aformentioned range listed, please consider follow up with your Primary Care Provider.

## 2018-12-28 NOTE — Progress Notes (Signed)
I have gone over hospital instructions with patient in detail for flexible sigmoidoscopy. Patient verbalizes understanding of prep, time, date and location of procedure.

## 2018-12-28 NOTE — Progress Notes (Signed)
Subjective:    Patient ID: [name] [name], male    DOB: Nov 29, 1919, 85 y.o.   MRN: [id number]  HPI [name] [name] is a 85 year old male with a past medical history of chronic constipation, hemorrhoids, atrial fibrillation on Eliquis, CHF with an EF of 20 to 25%, hypertension, CAD, renal insufficiency, arthritis who is here for follow-up.  He has had issues with ongoing intermittent rectal bleeding.  He has been working with my Mudlogger and nursing staff on a bowel regimen.  He is here today with his male friend.  He is currently taking MiraLAX 1 dose a day, senna 2 tablets a day though primary care reportedly increase this just yesterday to 3 times a day.  Amitiza is being used 24 mcg twice daily.  He has struggled with constipation and at other times loose stools.  He has had no bowel movement in the last 3 days.  However the bowel movement he had 3 days ago felt more normal being formed and easier to pass.  He has been using Tucks medicated pads which he feels is helped his hemorrhoidal symptoms.  He is using a bedside commode because it is taller and easier for him to use.  He denies abdominal pain.  When he does have rectal bleeding, which is again intermittent, it is usually bright red and can be copious amounts in the toilet bowl and even running down his legs.  He reports while this does not hurt it has been taxing on him mentally.  Appetite has remained fairly good.  He had a CT scan in the summer 2019 performed for his constipation which is below.  There was no overt colonic pathology.   Review of Systems As per HPI, otherwise negative  Current Medications, Allergies, Past Medical History, Past Surgical History, Family History and Social History were reviewed in [employer] record.     Objective:   Physical Exam BP (!) 106/54   Pulse 72   Ht 5\' 7"  (1.702 m)   Wt 173 lb (78.5 kg)   BMI 27.10 kg/m  Constitutional: Well-developed and  well-nourished. No distress. HEENT: Normocephalic and atraumatic.  Conjunctivae are normal.  No scleral icterus. Right eye is opaque Neck: Neck supple. Trachea midline. Cardiovascular: Irregular Pulmonary/chest: Effort normal and breath sounds normal. No wheezing, rales or rhonchi. Abdominal: Soft, nontender, nondistended. Bowel sounds active throughout.  Extremities: no clubbing, cyanosis, or edema Neurological: Alert and oriented to person place and time. Skin: Skin is warm and dry. Psychiatric: Normal mood and affect. Behavior is normal.  CBC    Component Value Date/Time   WBC 6.7 12/08/2018 1639   RBC 5.22 12/08/2018 1639   HGB 16.4 12/09/2018 1149   HCT 51.4 12/09/2018 1149   PLT 120 (L) 12/08/2018 1639   MCV 101.3 (H) 12/08/2018 1639   MCH 31.6 12/08/2018 1639   MCHC 31.2 12/08/2018 1639   RDW 15.2 12/08/2018 1639   LYMPHSABS 0.9 10/22/2018 1125   MONOABS 0.6 10/22/2018 1125   EOSABS 0.1 10/22/2018 1125   BASOSABS 0.0 10/22/2018 1125      EXAM: CT ABDOMEN AND PELVIS WITHOUT CONTRAST   TECHNIQUE: Multidetector CT imaging of the abdomen and pelvis was performed following the standard protocol without IV contrast.   COMPARISON:  08/16/2018 plain film exam.  04/06/2017 CT.   FINDINGS: Lower chest: Bilateral lower lobe and inferior lingula consolidation may represent infiltrates and/or atelectasis. Associated pleural effusion greater on the right. Small amount of gas within  the non dependent aspect of the right atrium and right ventricle. Question if this is related to IV line. Heart slightly enlarged.   Hepatobiliary: Taking into account limitation by non contrast imaging, no worrisome hepatic lesion. Post cholecystectomy.   Pancreas: Taking into account limitation by non contrast imaging, no worrisome pancreatic lesion or inflammation.   Spleen: Taking into account limitation by non contrast imaging, no splenic mass or enlargement.   Adrenals/Urinary Tract:  Chronic left hydronephrosis with ureteral pelvic junction obstruction pattern unchanged. No right-sided hydronephrosis. Bilateral renal cysts some of which are minimally complex. Taking into account limitation by non contrast imaging, no worrisome renal or adrenal lesion. Noncontrast filled imaging of the urinary bladder unremarkable.   Stomach/Bowel: Cecum and appendix extend into right inguinal canal. The terminal ileum is just above the herniated bowel. Fluid and gas-filled top-normal size small bowel loops without evidence of obstruction. If there were further herniation of colon into the right inguinal canal, it is possible this could cause small-bowel obstruction.   Stool and gas throughout the colon without obstruction noted.   Stomach unremarkable.   Vascular/Lymphatic: Atherosclerotic changes aorta and aortic branch vessels. No abdominal aortic aneurysm. Scattered normal size lymph nodes.   Reproductive: No worrisome abnormality.   Other: No free intraperitoneal air.  No drainable fluid collection.   Musculoskeletal: Degenerative changes throughout the lower thoracic and lumbar spine most notable L4-5 level. Hip joint degenerative changes. Sacroiliac joint degenerative changes.   IMPRESSION: 1. Small amount of gas within the non dependent aspect of the right atrium and right ventricle. Question if this is related to IV line. 2. Cecum and appendix extend into right inguinal canal. The terminal ileum is just above the herniated bowel. Fluid and gas-filled top-normal size small bowel loops without evidence of obstruction. If there were further herniation of colon into the right inguinal canal, it is possible this could cause small-bowel obstruction. 3. Stool and gas throughout the colon without obstruction noted. 4. Bilateral lower lobe and inferior lingula consolidation may represent infiltrates and/or atelectasis. Associated pleural effusion greater on the right. 5.  Chronic left hydronephrosis with ureteral pelvic junction obstruction pattern unchanged. Bilateral renal cysts similar to prior exam. 6.  Aortic Atherosclerosis (ICD10-I70.0).   These results will be called to the ordering clinician or representative by the Radiologist Assistant, and communication documented in the PACS or zVision Dashboard.     Electronically Signed   By: Genia Del M.D.   On: 06/15/2018 07:24       Assessment & Plan:  85 year old male with a past medical history of chronic constipation, hemorrhoids, atrial fibrillation on Eliquis, CHF with an EF of 20 to 25%, hypertension, CAD, renal insufficiency, arthritis who is here for follow-up.  1.  Chronic constipation/alternating bowel habits/intermittent rectal bleeding --while hemorrhoids in the setting of Eliquis most likely explains his bleeding, I have recommended that we consider flexible sigmoidoscopy with or without sedation to evaluate the left colon, fully examine for hemorrhoids but also rule out rectal and distal colonic pathology.  This has been an issue going on for months now and I want to ensure the exact diagnosis.  We discussed this at length including the risk, benefits and alternatives.  Certainly this carries some increased risk given his age.  He wishes to think about this and make a decision tomorrow.  Regarding the Eliquis we would ideally hold this for 24 to 48 hours before flexible sigmoidoscopy.  I am going to reach out to Dr. Percival Spanish for  his opinion regarding holding Eliquis but also his fitness for procedure.   If internal hemorrhoids are the only pathology seen, we could consider hemorrhoidal banding  Regarding his bowel regimen I will have him continue current therapy with MiraLAX 17 g daily, senna twice daily and Amitiza 24 mcg twice daily.  For flexible sigmoidoscopy, assuming we proceed, with plan magnesium citrate at home followed by 2 enemas once he arrives at [facility].  30 minutes  spent with the patient today. Greater than 50% was spent in counseling and coordination of care with the patient

## 2018-12-28 NOTE — H&P (View-Only) (Signed)
Subjective:    Patient ID: [name] [name], male    DOB: [birth year], 85 y.o.   MRN: [id number]  HPI [name] [name] is a 85 year old male with a past medical history of chronic constipation, hemorrhoids, atrial fibrillation on Eliquis, CHF with an EF of 20 to 25%, hypertension, CAD, renal insufficiency, arthritis who is here for follow-up.  He has had issues with ongoing intermittent rectal bleeding.  He has been working with my Mudlogger and nursing staff on a bowel regimen.  He is here today with his male friend.  He is currently taking MiraLAX 1 dose a day, senna 2 tablets a day though primary care reportedly increase this just yesterday to 3 times a day.  Amitiza is being used 24 mcg twice daily.  He has struggled with constipation and at other times loose stools.  He has had no bowel movement in the last 3 days.  However the bowel movement he had 3 days ago felt more normal being formed and easier to pass.  He has been using Tucks medicated pads which he feels is helped his hemorrhoidal symptoms.  He is using a bedside commode because it is taller and easier for him to use.  He denies abdominal pain.  When he does have rectal bleeding, which is again intermittent, it is usually bright red and can be copious amounts in the toilet bowl and even running down his legs.  He reports while this does not hurt it has been taxing on him mentally.  Appetite has remained fairly good.  He had a CT scan in the summer 2019 performed for his constipation which is below.  There was no overt colonic pathology.   Review of Systems As per HPI, otherwise negative  Current Medications, Allergies, Past Medical History, Past Surgical History, Family History and Social History were reviewed in [employer] record.     Objective:   Physical Exam BP (!) 106/54   Pulse 72   Ht 5\' 7"  (1.702 m)   Wt 173 lb (78.5 kg)   BMI 27.10 kg/m  Constitutional: Well-developed and  well-nourished. No distress. HEENT: Normocephalic and atraumatic.  Conjunctivae are normal.  No scleral icterus. Right eye is opaque Neck: Neck supple. Trachea midline. Cardiovascular: Irregular Pulmonary/chest: Effort normal and breath sounds normal. No wheezing, rales or rhonchi. Abdominal: Soft, nontender, nondistended. Bowel sounds active throughout.  Extremities: no clubbing, cyanosis, or edema Neurological: Alert and oriented to person place and time. Skin: Skin is warm and dry. Psychiatric: Normal mood and affect. Behavior is normal.  CBC    Component Value Date/Time   WBC 6.7 12/08/2018 1639   RBC 5.22 12/08/2018 1639   HGB 16.4 12/09/2018 1149   HCT 51.4 12/09/2018 1149   PLT 120 (L) 12/08/2018 1639   MCV 101.3 (H) 12/08/2018 1639   MCH 31.6 12/08/2018 1639   MCHC 31.2 12/08/2018 1639   RDW 15.2 12/08/2018 1639   LYMPHSABS 0.9 10/22/2018 1125   MONOABS 0.6 10/22/2018 1125   EOSABS 0.1 10/22/2018 1125   BASOSABS 0.0 10/22/2018 1125      EXAM: CT ABDOMEN AND PELVIS WITHOUT CONTRAST   TECHNIQUE: Multidetector CT imaging of the abdomen and pelvis was performed following the standard protocol without IV contrast.   COMPARISON:  08/16/2018 plain film exam.  04/06/2017 CT.   FINDINGS: Lower chest: Bilateral lower lobe and inferior lingula consolidation may represent infiltrates and/or atelectasis. Associated pleural effusion greater on the right. Small amount of gas within  the non dependent aspect of the right atrium and right ventricle. Question if this is related to IV line. Heart slightly enlarged.   Hepatobiliary: Taking into account limitation by non contrast imaging, no worrisome hepatic lesion. Post cholecystectomy.   Pancreas: Taking into account limitation by non contrast imaging, no worrisome pancreatic lesion or inflammation.   Spleen: Taking into account limitation by non contrast imaging, no splenic mass or enlargement.   Adrenals/Urinary Tract:  Chronic left hydronephrosis with ureteral pelvic junction obstruction pattern unchanged. No right-sided hydronephrosis. Bilateral renal cysts some of which are minimally complex. Taking into account limitation by non contrast imaging, no worrisome renal or adrenal lesion. Noncontrast filled imaging of the urinary bladder unremarkable.   Stomach/Bowel: Cecum and appendix extend into right inguinal canal. The terminal ileum is just above the herniated bowel. Fluid and gas-filled top-normal size small bowel loops without evidence of obstruction. If there were further herniation of colon into the right inguinal canal, it is possible this could cause small-bowel obstruction.   Stool and gas throughout the colon without obstruction noted.   Stomach unremarkable.   Vascular/Lymphatic: Atherosclerotic changes aorta and aortic branch vessels. No abdominal aortic aneurysm. Scattered normal size lymph nodes.   Reproductive: No worrisome abnormality.   Other: No free intraperitoneal air.  No drainable fluid collection.   Musculoskeletal: Degenerative changes throughout the lower thoracic and lumbar spine most notable L4-5 level. Hip joint degenerative changes. Sacroiliac joint degenerative changes.   IMPRESSION: 1. Small amount of gas within the non dependent aspect of the right atrium and right ventricle. Question if this is related to IV line. 2. Cecum and appendix extend into right inguinal canal. The terminal ileum is just above the herniated bowel. Fluid and gas-filled top-normal size small bowel loops without evidence of obstruction. If there were further herniation of colon into the right inguinal canal, it is possible this could cause small-bowel obstruction. 3. Stool and gas throughout the colon without obstruction noted. 4. Bilateral lower lobe and inferior lingula consolidation may represent infiltrates and/or atelectasis. Associated pleural effusion greater on the right. 5.  Chronic left hydronephrosis with ureteral pelvic junction obstruction pattern unchanged. Bilateral renal cysts similar to prior exam. 6.  Aortic Atherosclerosis (ICD10-I70.0).   These results will be called to the ordering clinician or representative by the Radiologist Assistant, and communication documented in the PACS or zVision Dashboard.     Electronically Signed   By: Genia Del M.D.   On: 06/15/2018 07:24       Assessment & Plan:  85 year old male with a past medical history of chronic constipation, hemorrhoids, atrial fibrillation on Eliquis, CHF with an EF of 20 to 25%, hypertension, CAD, renal insufficiency, arthritis who is here for follow-up.  1.  Chronic constipation/alternating bowel habits/intermittent rectal bleeding --while hemorrhoids in the setting of Eliquis most likely explains his bleeding, I have recommended that we consider flexible sigmoidoscopy with or without sedation to evaluate the left colon, fully examine for hemorrhoids but also rule out rectal and distal colonic pathology.  This has been an issue going on for months now and I want to ensure the exact diagnosis.  We discussed this at length including the risk, benefits and alternatives.  Certainly this carries some increased risk given his age.  He wishes to think about this and make a decision tomorrow.  Regarding the Eliquis we would ideally hold this for 24 to 48 hours before flexible sigmoidoscopy.  I am going to reach out to Dr. Percival Spanish for  his opinion regarding holding Eliquis but also his fitness for procedure.   If internal hemorrhoids are the only pathology seen, we could consider hemorrhoidal banding  Regarding his bowel regimen I will have him continue current therapy with MiraLAX 17 g daily, senna twice daily and Amitiza 24 mcg twice daily.  For flexible sigmoidoscopy, assuming we proceed, with plan magnesium citrate at home followed by 2 enemas once he arrives at [facility].  30 minutes  spent with the patient today. Greater than 50% was spent in counseling and coordination of care with the patient

## 2018-12-29 ENCOUNTER — Other Ambulatory Visit: Payer: Self-pay | Admitting: Cardiology

## 2018-12-29 NOTE — Telephone Encounter (Signed)
I have spoken to patient to advise that Dr Percival Spanish has okayed him to hold Eliquis 2 days prior to procedure and restart as soon as possible as per GI physician. Patient verbalizes clear understanding of this.

## 2018-12-29 NOTE — Telephone Encounter (Signed)
   Primary Cardiologist: Minus Breeding, MD  Chart reviewed as part of pre-operative protocol coverage. Patient was contacted 12/29/2018 in reference to pre-operative risk assessment for pending surgery as outlined below.  Perseus Westall was last seen on 12/17/2018 by Dr. Percival Spanish.  Since that day, Sameer Teeple has done well.  Therefore, based on ACC/AHA guidelines, the patient would be at acceptable risk for the planned procedure without further cardiovascular testing.   I will route this recommendation to the requesting party via Epic fax function and remove from pre-op pool.  Please call with questions. Per our clinical pharmacist: hold Eliquis for 2 days prior to endoscopy and restart after the procedure as soon as possible at the discretion of the GI doctor. I have informed the patient as well.  [place], [place] 12/29/2018, 1:23 PM

## 2018-12-29 NOTE — Telephone Encounter (Signed)
-----   Message -----  From: Minus Breeding, MD  Sent: 12/29/2018  8:58 AM EST  To: Jerene Bears, MD   OK to hold Eliquis as needed. He has no extraordinary risk for the procedure other this his advanced age and resultant frailness. Thanks. Maylon Cos  ----- Message -----  From: Jerene Bears, MD  Sent: 12/28/2018  6:41 PM EST  To: Minus Breeding, MD   [name] [name],  Mr. Severe, our mutual patient, continues to struggle with intermittent rectal bleeding in the setting of Eliquis. Fortunately he is not anemic. He is also having quite a hard time regulating his bowel movements.  I recommended that we consider flexible sigmoidoscopy, which maybe performed on Friday.  Would appreciate your thoughts regarding holding Eliquis, which I assume is okay. He asked me that I reach out to you regarding his cardiac status before procedure. My thought is to perform as limited of an exam is possible to get an answer.  Thanks  Ulice Dash

## 2018-12-29 NOTE — Telephone Encounter (Signed)
Patient takes Eliquis for afib with CHADS2VASc score of 5 (age x2, CHF, HTN, CAD). SCr 1.48, CrCl 56mL/min. Ok to hold Eliquis for 2 days prior to endoscopy.

## 2018-12-31 ENCOUNTER — Encounter (HOSPITAL_COMMUNITY): Payer: Self-pay | Admitting: *Deleted

## 2018-12-31 ENCOUNTER — Telehealth: Payer: Self-pay | Admitting: *Deleted

## 2018-12-31 ENCOUNTER — Other Ambulatory Visit: Payer: Self-pay

## 2018-12-31 ENCOUNTER — Other Ambulatory Visit: Payer: Self-pay | Admitting: Cardiology

## 2018-12-31 NOTE — Telephone Encounter (Signed)
Patient would like [place] to know that the person who will bring him to the hospital for his appointment is Einar Grad, phone [phone]. He just wanted [place] to have this information for charting purposes.

## 2018-12-31 NOTE — Progress Notes (Signed)
Spoke with patients [name] via telephone for pre procedure interview. Patient to take cardizem and amioderone AM of surgery with a sip of water. Patient to use and bring inhaler. Driver will be Scientist, research (medical). Arrival time 0900.

## 2019-01-01 ENCOUNTER — Encounter (HOSPITAL_COMMUNITY)
Admission: RE | Disposition: A | Discharge status: Home / Self Care | Payer: Self-pay | Source: Home / Self Care | Attending: Internal Medicine

## 2019-01-01 ENCOUNTER — Ambulatory Visit (HOSPITAL_COMMUNITY): Payer: Medicare Other | Admitting: Anesthesiology

## 2019-01-01 ENCOUNTER — Ambulatory Visit (HOSPITAL_COMMUNITY)
Admission: RE | Admit: 2019-01-01 | Discharge: 2019-01-01 | Disposition: A | Discharge status: Home / Self Care | Payer: Medicare Other | Attending: Internal Medicine | Admitting: Internal Medicine

## 2019-01-01 ENCOUNTER — Encounter (HOSPITAL_COMMUNITY): Payer: Self-pay | Admitting: *Deleted

## 2019-01-01 DIAGNOSIS — I5042 Chronic combined systolic (congestive) and diastolic (congestive) heart failure: Secondary | ICD-10-CM | POA: Diagnosis not present

## 2019-01-01 DIAGNOSIS — I10 Essential (primary) hypertension: Secondary | ICD-10-CM | POA: Diagnosis not present

## 2019-01-01 DIAGNOSIS — I2510 Atherosclerotic heart disease of native coronary artery without angina pectoris: Secondary | ICD-10-CM | POA: Insufficient documentation

## 2019-01-01 DIAGNOSIS — K625 Hemorrhage of anus and rectum: Secondary | ICD-10-CM | POA: Diagnosis not present

## 2019-01-01 DIAGNOSIS — I110 Hypertensive heart disease with heart failure: Secondary | ICD-10-CM | POA: Diagnosis not present

## 2019-01-01 DIAGNOSIS — Z7901 Long term (current) use of anticoagulants: Secondary | ICD-10-CM | POA: Insufficient documentation

## 2019-01-01 DIAGNOSIS — K5909 Other constipation: Secondary | ICD-10-CM | POA: Diagnosis not present

## 2019-01-01 DIAGNOSIS — K648 Other hemorrhoids: Secondary | ICD-10-CM | POA: Diagnosis not present

## 2019-01-01 DIAGNOSIS — I4891 Unspecified atrial fibrillation: Secondary | ICD-10-CM | POA: Diagnosis not present

## 2019-01-01 HISTORY — PX: FLEXIBLE SIGMOIDOSCOPY: SHX5431

## 2019-01-01 SURGERY — SIGMOIDOSCOPY, FLEXIBLE
Anesthesia: Monitor Anesthesia Care

## 2019-01-01 MED ORDER — FLEET ENEMA 7-19 GM/118ML RE ENEM
ENEMA | RECTAL | Status: AC
  Filled 2019-01-01: qty 1

## 2019-01-01 MED ORDER — LIDOCAINE 2% (20 MG/ML) 5 ML SYRINGE
INTRAMUSCULAR | Status: DC | PRN
  Administered 2019-01-01: 75 mg via INTRAVENOUS

## 2019-01-01 MED ORDER — PROPOFOL 10 MG/ML IV BOLUS
INTRAVENOUS | Status: AC
  Filled 2019-01-01: qty 20

## 2019-01-01 MED ORDER — FLEET ENEMA 7-19 GM/118ML RE ENEM
2.0000 | ENEMA | Freq: Once | RECTAL | Status: AC
  Administered 2019-01-01: 2 via RECTAL

## 2019-01-01 MED ORDER — PROPOFOL 10 MG/ML IV BOLUS
INTRAVENOUS | Status: DC | PRN
  Administered 2019-01-01 (×2): 20 mg via INTRAVENOUS

## 2019-01-01 MED ORDER — LACTATED RINGERS IV SOLN
INTRAVENOUS | Status: DC | PRN
  Administered 2019-01-01: 11:00:00 via INTRAVENOUS

## 2019-01-01 MED ORDER — FLEET ENEMA 7-19 GM/118ML RE ENEM
1.0000 | ENEMA | Freq: Once | RECTAL | Status: AC
  Administered 2019-01-01: 1 via RECTAL

## 2019-01-01 NOTE — Op Note (Signed)
[facility] Patient Name: [name] [name] Procedure Date: 01/01/2019 MRN: [id number] Attending MD: Jerene Bears , MD Date of Birth: Aug 08, 1920 CSN: [id number] Age: 85 Admit Type: Outpatient Procedure:                Flexible Sigmoidoscopy Indications:              Rectal hemorrhage Providers:                Lajuan Lines. Hilarie Fredrickson, MD, Cleda Daub, RN, Tinnie Gens,                            Technician, [facility], CRNA Referring MD:             Evie Lacks. Plotnikov MD, MD Medicines:                Monitored Anesthesia Care Complications:            No immediate complications. Estimated Blood Loss:     Estimated blood loss: none. Procedure:                Pre-Anesthesia Assessment:                           - Prior to the procedure, a History and Physical                            was performed, and patient medications and                            allergies were reviewed. The patient's tolerance of                            previous anesthesia was also reviewed. The risks                            and benefits of the procedure and the sedation                            options and risks were discussed with the patient.                            All questions were answered, and informed consent                            was obtained. Prior Anticoagulants: The patient has                            taken Eliquis (apixaban), last dose was 2 days                            prior to procedure. ASA Grade Assessment: III - A                            patient with severe systemic disease. After  reviewing the risks and benefits, the patient was                            deemed in satisfactory condition to undergo the                            procedure.                           After obtaining informed consent, the scope was                            passed under direct vision. The PCF-H190DL                            ([id number] Olympus  pediatric colonoscope was                            introduced through the anus and advanced to the                            sigmoid colon. The flexible sigmoidoscopy was                            accomplished without difficulty. The patient                            tolerated the procedure well. The quality of the                            bowel preparation was good. Scope In: Scope Out: Findings:      The digital rectal exam was normal. No masses.      Normal mucosa was found in the rectum, in the recto-sigmoid colon and in       the examined portions of sigmoid colon. Formed stool was found in the       mid sigmoid colon. Stool was nonbloody and non-melenic.      Internal hemorrhoids were found during retroflexion. The hemorrhoids       were small. Impression:               - Normal mucosa in the rectum, in the recto-sigmoid                            colon and in the sigmoid colon.                           - Internal hemorrhoids. Very likely source of                            intermittent rectal bleeding in the setting of                            anticoagulation therapy.                           -  No specimens collected. Moderate Sedation:      N/A Recommendation:           - Patient has a contact number available for                            emergencies. The signs and symptoms of potential                            delayed complications were discussed with the                            patient. Return to normal activities tomorrow.                            Written discharge instructions were provided to the                            patient.                           - Resume previous diet.                           - Continue present medications, including laxatives.                           - Continue to work on bowel regiment to promote                            regularity and avoid constipation.                           - Resume Eliquis (apixaban) at  prior dose today.                            Refer to managing physician for further adjustment                            of therapy. If bleeding is an ongoing problem, then                            we could consider hemorrhoidal banding versus                            discontinuation of anticoagulation with input from                            cardiology. Procedure Code(s):        --- Professional ---                           [phone], Sigmoidoscopy, flexible; diagnostic,                            including collection of specimen(s) by brushing or  washing, when performed (separate procedure) Diagnosis Code(s):        --- Professional ---                           K64.8, Other hemorrhoids                           K62.5, Hemorrhage of anus and rectum CPT copyright 2018 American Medical Association. All rights reserved. The codes documented in this report are preliminary and upon coder review may  be revised to meet current compliance requirements. Jerene Bears, MD 01/01/2019 11:23:04 AM This report has been signed electronically. Number of Addenda: 0

## 2019-01-01 NOTE — Transfer of Care (Signed)
Immediate Anesthesia Transfer of Care Note  Patient: [name] [name]  Procedure(s) Performed: FLEXIBLE SIGMOIDOSCOPY (N/A )  Patient Location: PACU  Anesthesia Type:MAC  Level of Consciousness: awake, alert  and oriented  Airway & Oxygen Therapy: Patient Spontanous Breathing and Patient connected to face mask oxygen  Post-op Assessment: Report given to RN and Post -op Vital signs reviewed and stable  Post vital signs: Reviewed and stable  Last Vitals:  Vitals Value Taken Time  BP    Temp    Pulse    Resp    SpO2      Last Pain:  Vitals:   01/01/19 1038  TempSrc: Oral  PainSc: 0-No pain         Complications: No apparent anesthesia complications

## 2019-01-01 NOTE — Anesthesia Preprocedure Evaluation (Addendum)
Anesthesia Evaluation  Patient identified by MRN, date of birth, ID band Patient awake    Reviewed: Allergy & Precautions, NPO status , Patient's Chart, lab work & pertinent test results  Airway Mallampati: III  TM Distance: >3 FB Neck ROM: Full  Mouth opening: Limited Mouth Opening  Dental no notable dental hx. (+) Teeth Intact, Dental Advisory Given   Pulmonary neg pulmonary ROS,    Pulmonary exam normal breath sounds clear to auscultation       Cardiovascular hypertension, Pt. on medications + CAD and +CHF  Normal cardiovascular exam+ dysrhythmias (on eliquis, last dose 2 days ago) Atrial Fibrillation  Rhythm:Regular Rate:Normal  TEE 03/2018 EF 20%, diffuse hypokinesis, mild AI, mild MR   Neuro/Psych PSYCHIATRIC DISORDERS Anxiety Depression negative neurological ROS     GI/Hepatic negative GI ROS, Neg liver ROS,   Endo/Other  negative endocrine ROS  Renal/GU Renal InsufficiencyRenal disease  negative genitourinary   Musculoskeletal negative musculoskeletal ROS (+)   Abdominal   Peds  Hematology negative hematology ROS (+) Hgb 16.4   Anesthesia Other Findings Flexible sigmoidoscopy for rectal bleeding    Reproductive/Obstetrics                            Anesthesia Physical Anesthesia Plan  ASA: IV  Anesthesia Plan: MAC   Post-op Pain Management:    Induction: Intravenous  PONV Risk Score and Plan: 1 and Treatment may vary due to age or medical condition and Propofol infusion  Airway Management Planned: Natural Airway  Additional Equipment:   Intra-op Plan:   Post-operative Plan:   Informed Consent: I have reviewed the patients History and Physical, chart, labs and discussed the procedure including the risks, benefits and alternatives for the proposed anesthesia with the patient or authorized representative who has indicated his/her understanding and acceptance.      Dental advisory given  Plan Discussed with: CRNA  Anesthesia Plan Comments:         Anesthesia Quick Evaluation

## 2019-01-01 NOTE — Interval H&P Note (Signed)
History and Physical Interval Note: Here today for flex sig with MAC.  Eliquis has been on hold.  Did mg citrate without BM.  We have performed fleets enema x 3 and will proceed to evaluate as much of the distal sigmoid and rectum as possible HIGHER THAN BASELINE RISK.The nature of the procedure, as well as the risks, benefits, and alternatives were carefully and thoroughly reviewed with the patient. Ample time for discussion and questions allowed. The patient understood, was satisfied, and agreed to proceed.     01/01/2019 10:40 AM  [name] [name]  has presented today for surgery, with the diagnosis of rectal bleeding  The various methods of treatment have been discussed with the patient and family. After consideration of risks, benefits and other options for treatment, the patient has consented to  Procedure(s): FLEXIBLE SIGMOIDOSCOPY (N/A) as a surgical intervention .  The patient's history has been reviewed, patient examined, no change in status, stable for surgery.  I have reviewed the patient's chart and labs.  Questions were answered to the patient's satisfaction.     Lajuan Lines Saahir Prude

## 2019-01-01 NOTE — Discharge Instructions (Signed)

## 2019-01-03 NOTE — Anesthesia Postprocedure Evaluation (Signed)
Anesthesia Post Note  Patient: Zaydrian Batta  Procedure(s) Performed: FLEXIBLE SIGMOIDOSCOPY (N/A )     Patient location during evaluation: Endoscopy Anesthesia Type: MAC Level of consciousness: awake and alert Pain management: pain level controlled Vital Signs Assessment: post-procedure vital signs reviewed and stable Respiratory status: spontaneous breathing, nonlabored ventilation, respiratory function stable and patient connected to nasal cannula oxygen Cardiovascular status: blood pressure returned to baseline and stable Postop Assessment: no apparent nausea or vomiting Anesthetic complications: no    Last Vitals:  Vitals:   01/01/19 1140 01/01/19 1150  BP: (!) 142/72 131/74  Pulse: 86 61  Resp: 18 (!) 21  Temp:    SpO2: 100% 97%    Last Pain:  Vitals:   01/01/19 1150  TempSrc:   PainSc: 0-No pain                 Kaleab Frasier L Chiniqua Kilcrease

## 2019-01-04 ENCOUNTER — Telehealth: Payer: Self-pay | Admitting: Internal Medicine

## 2019-01-04 ENCOUNTER — Encounter (HOSPITAL_COMMUNITY): Payer: Self-pay | Admitting: Internal Medicine

## 2019-01-04 DIAGNOSIS — F329 Major depressive disorder, single episode, unspecified: Secondary | ICD-10-CM | POA: Diagnosis not present

## 2019-01-04 DIAGNOSIS — K649 Unspecified hemorrhoids: Secondary | ICD-10-CM | POA: Diagnosis not present

## 2019-01-04 DIAGNOSIS — N183 Chronic kidney disease, stage 3 (moderate): Secondary | ICD-10-CM | POA: Diagnosis not present

## 2019-01-04 DIAGNOSIS — I480 Paroxysmal atrial fibrillation: Secondary | ICD-10-CM | POA: Diagnosis not present

## 2019-01-04 DIAGNOSIS — M1990 Unspecified osteoarthritis, unspecified site: Secondary | ICD-10-CM | POA: Diagnosis not present

## 2019-01-04 DIAGNOSIS — I5042 Chronic combined systolic (congestive) and diastolic (congestive) heart failure: Secondary | ICD-10-CM | POA: Diagnosis not present

## 2019-01-04 DIAGNOSIS — I130 Hypertensive heart and chronic kidney disease with heart failure and stage 1 through stage 4 chronic kidney disease, or unspecified chronic kidney disease: Secondary | ICD-10-CM | POA: Diagnosis not present

## 2019-01-04 DIAGNOSIS — F419 Anxiety disorder, unspecified: Secondary | ICD-10-CM | POA: Diagnosis not present

## 2019-01-04 DIAGNOSIS — N400 Enlarged prostate without lower urinary tract symptoms: Secondary | ICD-10-CM | POA: Diagnosis not present

## 2019-01-04 DIAGNOSIS — I2510 Atherosclerotic heart disease of native coronary artery without angina pectoris: Secondary | ICD-10-CM | POA: Diagnosis not present

## 2019-01-04 NOTE — Telephone Encounter (Signed)
Pt states he is having diarrhea, he did take the amitiza and senna this morning. Discussed with him that he should hold the amitiza this evening and wait and see how he is doing in the AM. If he is still having diarrhea he may need to hold the amitiza and senna in the am. Pt verbalized understanding.

## 2019-01-04 NOTE — Telephone Encounter (Signed)
Pt had surgery by Dr. Hilarie Fredrickson at [facility] 2.14.20.  Pt reported having severe diarrhea.  Please advise.

## 2019-01-06 ENCOUNTER — Telehealth: Payer: Self-pay | Admitting: Internal Medicine

## 2019-01-06 NOTE — Telephone Encounter (Signed)
Copied from [facility] [id number]. Topic: Quick Communication - Home Health Verbal Orders >> Jan 06, 2019  4:57 PM Berneta Levins wrote: Caller/Agency: Wilhemena Durie with [facility] Number: [phone], OK to leave a message Requesting OT/PT/Skilled Nursing/Social Work: PT Frequency: 2x a week for 4 weeks

## 2019-01-07 NOTE — Telephone Encounter (Signed)
Routing to dr plotnikov, please advise, thanks 

## 2019-01-07 NOTE — Telephone Encounter (Signed)
Ok Thx 

## 2019-01-08 NOTE — Telephone Encounter (Signed)
Advised [facility] ok for verbal orders requested

## 2019-01-13 ENCOUNTER — Telehealth: Payer: Self-pay

## 2019-01-13 NOTE — Telephone Encounter (Signed)
Spoke with pt and he is aware. 

## 2019-01-13 NOTE — Telephone Encounter (Signed)
Pt calling back stating he drank the bottle of mag citrate this am and still has not had a BM. Pt states no BM in 5 days, reports he has been taking amitiza bid and miralax and still has not had a BM. Please advise.

## 2019-01-13 NOTE — Telephone Encounter (Signed)
Fleets enema, if he is able to perform at home.  If not MiraLAX half prep.  8 doses of MiraLAX in 32 ounces of Gatorade consumed over 2 hours

## 2019-01-13 NOTE — Telephone Encounter (Signed)
Pt called and states he has not had BM in 5 days. Discussed with pt he can try magnesium citrate for constipation and to call us back if this does not work. Pt verbalized understanding.

## 2019-01-14 ENCOUNTER — Telehealth: Payer: Self-pay | Admitting: Internal Medicine

## 2019-01-14 NOTE — Telephone Encounter (Signed)
Pt states he did have a BM this morning, not as much as he would have liked. Also states he had quite a bit of BRB. Discussed with the pt that it was probably from his internal hemorrhoids. Pt knows to continue his amitiza and senokot, he will call back if he continues to have problems.

## 2019-01-14 NOTE — Telephone Encounter (Signed)
Patient wants to speak to nurse states that he is having trouble using the bathroom has some rectal bleeding today,not a lot of progress with Magnesium Citrate.

## 2019-01-20 DIAGNOSIS — I130 Hypertensive heart and chronic kidney disease with heart failure and stage 1 through stage 4 chronic kidney disease, or unspecified chronic kidney disease: Secondary | ICD-10-CM | POA: Diagnosis not present

## 2019-01-20 DIAGNOSIS — N183 Chronic kidney disease, stage 3 (moderate): Secondary | ICD-10-CM | POA: Diagnosis not present

## 2019-01-20 DIAGNOSIS — I5042 Chronic combined systolic (congestive) and diastolic (congestive) heart failure: Secondary | ICD-10-CM | POA: Diagnosis not present

## 2019-01-20 DIAGNOSIS — I480 Paroxysmal atrial fibrillation: Secondary | ICD-10-CM

## 2019-01-20 DIAGNOSIS — K649 Unspecified hemorrhoids: Secondary | ICD-10-CM | POA: Diagnosis not present

## 2019-02-01 ENCOUNTER — Other Ambulatory Visit: Payer: Self-pay | Admitting: Internal Medicine

## 2019-02-08 ENCOUNTER — Other Ambulatory Visit: Payer: Self-pay | Admitting: Internal Medicine

## 2019-02-11 ENCOUNTER — Other Ambulatory Visit: Payer: Self-pay

## 2019-02-11 ENCOUNTER — Ambulatory Visit (INDEPENDENT_AMBULATORY_CARE_PROVIDER_SITE_OTHER): Payer: Medicare Other | Admitting: Internal Medicine

## 2019-02-11 ENCOUNTER — Encounter: Payer: Self-pay | Admitting: Internal Medicine

## 2019-02-11 DIAGNOSIS — R627 Adult failure to thrive: Secondary | ICD-10-CM

## 2019-02-11 DIAGNOSIS — I482 Chronic atrial fibrillation, unspecified: Secondary | ICD-10-CM | POA: Diagnosis not present

## 2019-02-11 DIAGNOSIS — I2510 Atherosclerotic heart disease of native coronary artery without angina pectoris: Secondary | ICD-10-CM

## 2019-02-11 DIAGNOSIS — K5900 Constipation, unspecified: Secondary | ICD-10-CM

## 2019-02-11 DIAGNOSIS — N183 Chronic kidney disease, stage 3 unspecified: Secondary | ICD-10-CM

## 2019-02-11 DIAGNOSIS — I10 Essential (primary) hypertension: Secondary | ICD-10-CM | POA: Diagnosis not present

## 2019-02-11 NOTE — Assessment & Plan Note (Signed)
On Toprol, Furosemide

## 2019-02-11 NOTE — Progress Notes (Signed)
Subjective:  Patient ID: [name] [name], male    DOB: 1920-01-31  Age: 85 y.o. MRN: [id number]  CC: No chief complaint on file.   HPI [name] [name] presents for severe constipation. MOM seems to work ok. Small BMs daily now Taking Senakot, Miralax daily. Seeing Dr Hilarie Fredrickson... F/u HTN, dyslipidemia, OA Planning to move to [place] with his 85 yo [place]  Outpatient Medications Prior to Visit  Medication Sig Dispense Refill  . ALPRAZolam (XANAX) 1 MG tablet Take 1 mg by mouth at bedtime.    Marland Kitchen amiodarone (PACERONE) 200 MG tablet TAKE 1 TABLET ONCE DAILY. 30 tablet 2  . diltiazem (CARDIZEM) 30 MG tablet TAKE 1 TABLET BY MOUTH TWICE DAILY. 60 tablet 6  . ELIQUIS 2.5 MG TABS tablet TAKE 1 TABLET BY MOUTH TWICE DAILY. 60 tablet 0  . finasteride (PROSCAR) 5 MG tablet Take 1 tablet (5 mg total) by mouth daily. 90 tablet 3  . lubiprostone (AMITIZA) 24 MCG capsule Take 1 capsules by mouth twice daily. (Patient taking differently: Take 24 mcg by mouth 2 (two) times daily. ) 60 capsule 3  . Multiple Vitamins-Minerals (ICAPS PO) Take 1 capsule by mouth daily.    . polyethylene glycol powder (MIRALAX) powder 1 capful BID (Patient taking differently: Take 17 g by mouth at bedtime. ) 255 g 0  . pravastatin (PRAVACHOL) 20 MG tablet TAKE 1 TABLET EACH DAY. (Patient taking differently: Take 20 mg by mouth daily. ) 30 tablet 11  . senna (SENOKOT) 8.6 MG tablet Take 1 tablet by mouth 2 (two) times daily.    Marland Kitchen torsemide (DEMADEX) 10 MG tablet Take 1 tablet (10 mg total) by mouth daily. 30 tablet 11  . triamcinolone ointment (KENALOG) 0.1 % Apply 1 application topically 2 (two) times daily. To irritated skin 80 g 3  . VENTOLIN HFA 108 (90 Base) MCG/ACT inhaler Inhale 2 puffs into the lungs every 6 (six) hours as needed for wheezing or shortness of breath.   0   No facility-administered medications prior to visit.     ROS: Review of Systems  Constitutional: Positive for fatigue. Negative for appetite  change and unexpected weight change.  HENT: Negative for congestion, nosebleeds, sneezing, sore throat and trouble swallowing.   Eyes: Negative for itching and visual disturbance.  Respiratory: Negative for cough.   Cardiovascular: Negative for chest pain, palpitations and leg swelling.  Gastrointestinal: Positive for constipation. Negative for abdominal distention, blood in stool, diarrhea and nausea.  Genitourinary: Negative for frequency and hematuria.  Musculoskeletal: Positive for arthralgias, back pain and gait problem. Negative for joint swelling and neck pain.  Skin: Negative for rash.  Neurological: Positive for weakness. Negative for dizziness, tremors and speech difficulty.  Psychiatric/Behavioral: Negative for agitation, dysphoric mood and sleep disturbance. The patient is not nervous/anxious.     Objective:  BP 106/64 (BP Location: Left Arm, Patient Position: Sitting, Cuff Size: Normal)   Pulse 63   Temp 98.2 F (36.8 C) (Oral)   Ht 5\' 7"  (1.702 m)   Wt 174 lb (78.9 kg)   SpO2 95%   BMI 27.25 kg/m   BP Readings from Last 3 Encounters:  02/11/19 106/64  01/01/19 131/74  12/28/18 (!) 106/54    Wt Readings from Last 3 Encounters:  02/11/19 174 lb (78.9 kg)  01/01/19 173 lb 1 oz (78.5 kg)  12/28/18 173 lb (78.5 kg)    Physical Exam Constitutional:      General: He is not in acute distress.  Appearance: He is well-developed.     Comments: NAD  Eyes:     Conjunctiva/sclera: Conjunctivae normal.     Pupils: Pupils are equal, round, and reactive to light.  Neck:     Musculoskeletal: Normal range of motion.     Thyroid: No thyromegaly.     Vascular: No JVD.  Cardiovascular:     Rate and Rhythm: Normal rate and regular rhythm.     Heart sounds: Normal heart sounds. No murmur. No friction rub. No gallop.   Pulmonary:     Effort: Pulmonary effort is normal. No respiratory distress.     Breath sounds: Normal breath sounds. No wheezing or rales.  Chest:      Chest wall: No tenderness.  Abdominal:     General: Bowel sounds are normal. There is no distension.     Palpations: Abdomen is soft. There is no mass.     Tenderness: There is no abdominal tenderness. There is no guarding or rebound.  Musculoskeletal: Normal range of motion.        General: No tenderness.  Lymphadenopathy:     Cervical: No cervical adenopathy.  Skin:    General: Skin is warm and dry.     Findings: No rash.  Neurological:     Mental Status: He is alert and oriented to person, place, and time.     Cranial Nerves: No cranial nerve deficit.     Motor: No abnormal muscle tone.     Coordination: Coordination normal.     Gait: Gait normal.     Deep Tendon Reflexes: Reflexes are normal and symmetric.  Psychiatric:        Behavior: Behavior normal.        Thought Content: Thought content normal.        Judgment: Judgment normal.   cane Stiff joints  Lab Results  Component Value Date   WBC 6.7 12/08/2018   HGB 16.4 12/09/2018   HCT 51.4 12/09/2018   PLT 120 (L) 12/08/2018   GLUCOSE 92 12/09/2018   CHOL 112 03/30/2018   TRIG 80 03/30/2018   HDL 39 (L) 03/30/2018   LDLCALC 57 03/30/2018   ALT 7 12/08/2018   AST 20 12/08/2018   NA 141 12/09/2018   K 4.3 12/09/2018   CL 106 12/09/2018   CREATININE 1.48 (H) 12/09/2018   BUN 28 (H) 12/09/2018   CO2 28 12/09/2018   TSH 1.681 03/30/2018   PSA 0.24 03/08/2014   INR 1.28 12/08/2018   HGBA1C 5.7 (H) 03/31/2018    No results found.  Assessment & Plan:   There are no diagnoses linked to this encounter.   No orders of the defined types were placed in this encounter.    Follow-up: No follow-ups on file.  Walker Kehr, MD

## 2019-02-11 NOTE — Assessment & Plan Note (Signed)
Eliquis Rate controlled

## 2019-02-11 NOTE — Assessment & Plan Note (Signed)
Eliquis, Pravastatin 

## 2019-02-11 NOTE — Assessment & Plan Note (Signed)
Use Miralax as needed,  Senakot S as needed, Fleet enema as needed,  Nulytely - drink 1 glass 2-4 times a day as needed  Amitiza qd

## 2019-02-11 NOTE — Assessment & Plan Note (Signed)
Discussed Planning to move to [place] with his 85 yo [place]

## 2019-02-11 NOTE — Assessment & Plan Note (Signed)
Good hydration 

## 2019-02-12 DIAGNOSIS — R0602 Shortness of breath: Secondary | ICD-10-CM | POA: Diagnosis not present

## 2019-02-23 DIAGNOSIS — R0602 Shortness of breath: Secondary | ICD-10-CM | POA: Diagnosis not present

## 2019-03-03 ENCOUNTER — Other Ambulatory Visit: Payer: Self-pay | Admitting: Internal Medicine

## 2019-03-13 DIAGNOSIS — R0602 Shortness of breath: Secondary | ICD-10-CM | POA: Diagnosis not present

## 2019-03-15 ENCOUNTER — Telehealth: Payer: Self-pay | Admitting: Cardiology

## 2019-03-15 NOTE — Telephone Encounter (Signed)
Spoke with Joylene Grapes NP from [facility] and she has been seeing the pt for about a month.. they are contracted with the pts Insurance [employer] for Supplemental Care... Dr. Lauretta Chester is his PMD..  They have seen the pt at home sue to increased SOB 1 month ago and did CXR at it showed pneumonia and CHF... they increased his Lasix for 3 days and treated him with antibiotics... the repeat CXR done Sat 03/13/19 showed resolved Pneumonia but worsening CHF... she is asking to speak with Kerin Ransom PA since has has an appt with him 03/18/19 but she thinks Telephone visit inappropriate and wold like to ask how to go forward with a plan to treat the pt... I advised her that I can forward to Dr. Redmond Pulling... his primary Cardiologist for a possible call back...since she is asking to talk with someone.   Pt is still living independently at home and not at [facility] yet as noted in Dr. Judeen Hammans last note.   Her # [phone]  She is faxing the CXR to the [facility] office.

## 2019-03-15 NOTE — Telephone Encounter (Signed)
Does she want to talk to me or dr Percival Spanish

## 2019-03-15 NOTE — Telephone Encounter (Signed)
  [name] is calling because she did an xray on 03/14/19 on Mr [name] [name] and it looks bad. She would like to speak to [facility] or Dr Percival Spanish to discuss the results. Patient does have an appt with [facility] on 03/18/19. She will fax over a copy to the [place] office main fax.

## 2019-03-15 NOTE — Telephone Encounter (Signed)
We received a call today from Mr. Marjory Lies home health NP.  He has been having some increasing shortness of breath and lower extremity edema.  A few weeks ago he was treated with a course of antibiotics as well as increased diuretics for a few days.  Initially got better but she notes he still has some swelling and a chest x-ray done 2 days ago reportedly showed persistent heart failure.  I suggested we increase his torsemide to 20 mg daily, he is scheduled for a tele-visit on April 30 with me and I will review his symptoms then.  Unfortunately if he is not better with the outpatient adjustment of his medications he may require an admission.  Kerin Ransom PA-C 03/15/2019 1:01 PM

## 2019-03-16 ENCOUNTER — Telehealth: Payer: Self-pay

## 2019-03-16 NOTE — Telephone Encounter (Signed)

## 2019-03-16 NOTE — Telephone Encounter (Signed)
Patient gave me [name] [name] at [facility] number he said she checks his blood pressure. Ph. [phone]

## 2019-03-18 ENCOUNTER — Telehealth: Payer: Self-pay | Admitting: Cardiology

## 2019-03-18 ENCOUNTER — Encounter: Payer: Self-pay | Admitting: Cardiology

## 2019-03-18 ENCOUNTER — Telehealth: Payer: Self-pay

## 2019-03-18 ENCOUNTER — Telehealth (INDEPENDENT_AMBULATORY_CARE_PROVIDER_SITE_OTHER): Payer: Medicare Other | Admitting: Cardiology

## 2019-03-18 VITALS — Ht 68.0 in | Wt 170.0 lb

## 2019-03-18 DIAGNOSIS — I5042 Chronic combined systolic (congestive) and diastolic (congestive) heart failure: Secondary | ICD-10-CM

## 2019-03-18 DIAGNOSIS — I420 Dilated cardiomyopathy: Secondary | ICD-10-CM

## 2019-03-18 DIAGNOSIS — I2510 Atherosclerotic heart disease of native coronary artery without angina pectoris: Secondary | ICD-10-CM

## 2019-03-18 DIAGNOSIS — Z7901 Long term (current) use of anticoagulants: Secondary | ICD-10-CM

## 2019-03-18 DIAGNOSIS — N183 Chronic kidney disease, stage 3 unspecified: Secondary | ICD-10-CM

## 2019-03-18 DIAGNOSIS — I482 Chronic atrial fibrillation, unspecified: Secondary | ICD-10-CM

## 2019-03-18 DIAGNOSIS — I5022 Chronic systolic (congestive) heart failure: Secondary | ICD-10-CM

## 2019-03-18 DIAGNOSIS — I480 Paroxysmal atrial fibrillation: Secondary | ICD-10-CM

## 2019-03-18 NOTE — Telephone Encounter (Signed)
Follow up:    Patient calling concerning a appt he had this morning. Patient states that he was on the phone with someone and they said to hold on and no one ever came back to the phone. Please call patient back. Patient been waiting 2 hours.

## 2019-03-18 NOTE — Patient Instructions (Signed)
Medication Instructions:  Your physician recommends that you continue on your current medications as directed. Please refer to the Current Medication list given to you today. If you need a refill on your cardiac medications before your next appointment, please call your pharmacy.   Lab work: None  If you have labs (blood work) drawn today and your tests are completely normal, you will receive your results only by: Marland Kitchen MyChart Message (if you have MyChart) OR . A paper copy in the mail If you have any lab test that is abnormal or we need to change your treatment, we will call you to review the results.  Testing/Procedures: None   Follow-Up: At [facility], you and your health needs are our priority.  As part of our continuing mission to provide you with exceptional heart care, we have created designated Provider Care Teams.  These Care Teams include your primary Cardiologist (physician) and Advanced Practice Providers (APPs -  Physician Assistants and Nurse Practitioners) who all work together to provide you with the care you need, when you need it. You will need a follow up appointment in 3-4 weeks. You may see Minus Breeding, MD or one of the following Advanced Practice Providers on your designated Care Team:   Rosaria Ferries, PA-C . Jory Sims, DNP, ANP  Any Other Special Instructions Will Be Listed Below (If Applicable).

## 2019-03-18 NOTE — Telephone Encounter (Signed)
Lm for [name] [name] to call back on my cell phone/cy

## 2019-03-18 NOTE — Telephone Encounter (Signed)
Follow up:   Patient is returning call back. Please call patient back. Patient states that no one can get threw. Please call his son. Patient has been waiting. If  you can not get threw please call patient son at [phone].

## 2019-03-18 NOTE — Telephone Encounter (Signed)
Spoke with Lurena Joiner and finally got a hold of pt ./cy

## 2019-03-18 NOTE — Telephone Encounter (Signed)
Contacted patient to dicuss AVS instructions. Patient voiced understanding.

## 2019-03-18 NOTE — Progress Notes (Signed)
Virtual Visit via Telephone Note   This visit type was conducted due to national recommendations for restrictions regarding the COVID-19 Pandemic (e.g. social distancing) in an effort to limit this patient's exposure and mitigate transmission in our community.  Due to his co-morbid illnesses, this patient is at least at moderate risk for complications without adequate follow up.  This format is felt to be most appropriate for this patient at this time.  The patient did not have access to video technology/had technical difficulties with video requiring transitioning to audio format only (telephone).  All issues noted in this document were discussed and addressed.  No physical exam could be performed with this format.  Please refer to the patient's chart for his  consent to telehealth for [facility].  Evaluation Performed:  Follow-up visit  This visit type was conducted due to national recommendations for restrictions regarding the COVID-19 Pandemic (e.g. social distancing).  This format is felt to be most appropriate for this patient at this time.  All issues noted in this document were discussed and addressed.  No physical exam was performed (except for noted visual exam findings with Video Visits).  Please refer to the patient's chart (MyChart message for video visits and phone note for telephone visits) for the patient's consent to telehealth for [facility].  Date:  03/18/2019   ID:  [name] [name], DOB 1920/08/20, MRN [id number]  Patient Location: Home [address] [place] [address]   Provider location:   [place]   PCP:  Plotnikov, Evie Lacks, MD  Cardiologist:  Minus Breeding, MD  Electrophysiologist:  None   Chief Complaint:  DOE  History of Present Illness:    Saw [name] [name] is a 85 y.o. male who presents via audio/video conferencing for a telehealth visit today.  He has a history of atrial fibrilation. He was admitted in May 2019 with recurrent AF, AKI, and CHF. His  weight was up to 192 lbs, almost 20 lbs over his baseline. He was admitted and placed on Diltiazem for rate control. He was re loaded with Amiodarone. He underwent TEE CV to NSR. When dischargedhome his weight was down to 173 lbs. As an OP he has gone back into atrial fibrillation but he has tolerated this better. His rate has been controlled and his weight remains stable. He is on Amiodarone for rate control.  He is living in his own home, he has a nursing service that comes in and checks on him.  The NP that works with the service call the other saying [name] had increased SOB.  I suggested he increase hi Demadex to 20 mg for 3 days and he was to be contacted today.  Unfortunately we had issues getting through to him at home.  When we did finally get hold of him he was somewhat upset. I spoke with him for > 25 minutes.  He took the extra Demadex for two days but it cause urinary frequency so he went back to his usual dose. He denies orthopnea or LE edema.  His weight is actually lower than his baseline.  His main complaint is exertional fatigue and constipation.   The patient does not symptoms concerning for COVID-19 infection (fever, chills, cough, or new SHORTNESS OF BREATH).    Prior CV studies:   The following studies were reviewed today:  Past Medical History:  Diagnosis Date  . Anxiety   . Arthritis    "shoulders" (05/28/2016)  . Cardiomyopathy   . Coronary artery  disease   . Depression    hx (05/28/2016)  . Hemorrhoids   . Hyperlipidemia   . Hypertension   . Paroxysmal atrial fibrillation (HCC)   . Skipped heart beats    Past Surgical History:  Procedure Laterality Date  . APPENDECTOMY    . CARDIOVERSION N/A 06/04/2016   Procedure: CARDIOVERSION;  Surgeon: Pixie Casino, MD;  Location: [facility] ENDOSCOPY;  Service: Cardiovascular;  Laterality: N/A;  . CARDIOVERSION N/A 04/09/2017   Procedure: CARDIOVERSION;  Surgeon: Thayer Headings, MD;  Location: [facility] ENDOSCOPY;  Service:  Cardiovascular;  Laterality: N/A;  . CARDIOVERSION N/A 04/01/2018   Procedure: CARDIOVERSION;  Surgeon: Skeet Latch, MD;  Location: [place];  Service: Cardiovascular;  Laterality: N/A;  . CATARACT EXTRACTION Left   . CHOLECYSTECTOMY OPEN    . COLONOSCOPY W/ BIOPSIES AND POLYPECTOMY    . FLEXIBLE SIGMOIDOSCOPY N/A 01/01/2019   Procedure: FLEXIBLE SIGMOIDOSCOPY;  Surgeon: Jerene Bears, MD;  Location: [facility] ENDOSCOPY;  Service: Gastroenterology;  Laterality: N/A;  . INGUINAL HERNIA REPAIR Bilateral   . MYRINGOTOMY WITH TUBE PLACEMENT Bilateral   . skin cancer removal Right    side of nose by Rt eye  . TEE WITHOUT CARDIOVERSION N/A 06/04/2016   Procedure: TRANSESOPHAGEAL ECHOCARDIOGRAM (TEE);  Surgeon: Pixie Casino, MD;  Location: [facility] ENDOSCOPY;  Service: Cardiovascular;  Laterality: N/A;  . TEE WITHOUT CARDIOVERSION N/A 04/01/2018   Procedure: TRANSESOPHAGEAL ECHOCARDIOGRAM (TEE);  Surgeon: Skeet Latch, MD;  Location: [place];  Service: Cardiovascular;  Laterality: N/A;  . TONSILLECTOMY AND ADENOIDECTOMY       Current Meds  Medication Sig  . ALPRAZolam (XANAX) 1 MG tablet Take 1 mg by mouth at bedtime.  Marland Kitchen amiodarone (PACERONE) 200 MG tablet TAKE 1 TABLET ONCE DAILY.  Marland Kitchen diltiazem (CARDIZEM) 30 MG tablet TAKE 1 TABLET BY MOUTH TWICE DAILY.  Marland Kitchen ELIQUIS 2.5 MG TABS tablet TAKE 1 TABLET BY MOUTH TWICE DAILY.  . finasteride (PROSCAR) 5 MG tablet Take 1 tablet (5 mg total) by mouth daily.  Marland Kitchen lubiprostone (AMITIZA) 24 MCG capsule Take 1 capsules by mouth twice daily. (Patient taking differently: Take 24 mcg by mouth 2 (two) times daily. )  . Multiple Vitamins-Minerals (ICAPS PO) Take 1 capsule by mouth daily.  . polyethylene glycol powder (MIRALAX) powder 1 capful BID (Patient taking differently: Take 17 g by mouth at bedtime. )  . pravastatin (PRAVACHOL) 20 MG tablet TAKE 1 TABLET EACH DAY. (Patient taking differently: Take 20 mg by mouth daily. )  . senna (SENOKOT) 8.6 MG tablet  Take 1 tablet by mouth 2 (two) times daily.  Marland Kitchen torsemide (DEMADEX) 10 MG tablet Take 1 tablet (10 mg total) by mouth daily.  Marland Kitchen triamcinolone ointment (KENALOG) 0.1 % Apply 1 application topically 2 (two) times daily. To irritated skin  . VENTOLIN HFA 108 (90 Base) MCG/ACT inhaler Inhale 2 puffs into the lungs every 6 (six) hours as needed for wheezing or shortness of breath.      Allergies:   Lisinopril   Social History   Tobacco Use  . Smoking status: Never Smoker  . Smokeless tobacco: Never Used  Substance Use Topics  . Alcohol use: No  . Drug use: No     Family Hx: The patient's family history includes Heart disease in his brother.  ROS:   Please see the history of present illness.    All other systems reviewed and are negative.   Labs/Other Tests and Data Reviewed:    Recent Labs: 03/30/2018: B Natriuretic  Peptide 758.0; TSH 1.681 03/31/2018: Magnesium 2.0 12/08/2018: ALT 7; Platelets 120 12/09/2018: BUN 28; Creatinine, Ser 1.48; Hemoglobin 16.4; Potassium 4.3; Sodium 141   Recent Lipid Panel Lab Results  Component Value Date/Time   CHOL 112 03/30/2018 05:49 PM   TRIG 80 03/30/2018 05:49 PM   HDL 39 (L) 03/30/2018 05:49 PM   CHOLHDL 2.9 03/30/2018 05:49 PM   LDLCALC 57 03/30/2018 05:49 PM    Wt Readings from Last 3 Encounters:  03/18/19 170 lb (77.1 kg)  02/11/19 174 lb (78.9 kg)  01/01/19 173 lb 1 oz (78.5 kg)     Exam:    Vital Signs:  Ht 5\' 8"  (1.727 m)   Wt 170 lb (77.1 kg)   BMI 25.85 kg/m    No acute distress on the phone- not SOB with conversation  ASSESSMENT & PLAN:     Chronic combined systolic and diastolic CHF (congestive heart failure) (HCC) Weight now 170 lbs and stable.  Atrial fibrillation with RVR ([place])  I suspect he has CAF. Continue Amiodarone for rate control.   Congestive dilated cardiomyopathy (HCC) EF 20% by TEE May 2019 when he was in AF-no point in repeating this  CKD (chronic kidney disease) stage 3, GFR 30-59 ml/min  (HCC) GFR 28- SCr 1.77, GFR 32 Aug 2019  Chronic anticoagulation Low dose Eliquis    COVID-19 Education: The signs and symptoms of COVID-19 were discussed with the patient and how to seek care for testing (follow up with PCP or arrange E-visit).  The importance of social distancing was discussed today.  Patient Risk:   After full review of this patients clinical status, I feel that they are at least moderate risk at this time.  Time:   Today, I have spent 25 minutes with the patient with telehealth technology discussing CHF and constipation.     Medication Adjustments/Labs and Tests Ordered: Current medicines are reviewed at length with the patient today.  Concerns regarding medicines are outlined above.  Tests Ordered: No orders of the defined types were placed in this encounter.  Medication Changes: No orders of the defined types were placed in this encounter.   Disposition:  No change- f/u Dr Percival Spanish in 3-4 weeks.   Angelena Form, PA-C  03/18/2019 4:50 PM    [facility]

## 2019-03-23 NOTE — Telephone Encounter (Signed)
Virtual visit schedule for 05/22 with Dr Percival Spanish

## 2019-03-24 DIAGNOSIS — R05 Cough: Secondary | ICD-10-CM | POA: Diagnosis not present

## 2019-03-24 DIAGNOSIS — R0602 Shortness of breath: Secondary | ICD-10-CM | POA: Diagnosis not present

## 2019-03-31 ENCOUNTER — Telehealth: Payer: Self-pay | Admitting: Internal Medicine

## 2019-03-31 ENCOUNTER — Other Ambulatory Visit: Payer: Self-pay | Admitting: Physician Assistant

## 2019-03-31 ENCOUNTER — Other Ambulatory Visit: Payer: Self-pay | Admitting: Internal Medicine

## 2019-03-31 NOTE — Telephone Encounter (Signed)
Pt states he is constipated again and has not had a BM in 4 days.Pt wanted to know what he could take. Discussed with pt that last time he had this issue magnesium citrate did not help him, he had to take miralax 8 doses in 32oz of gatorade. Pt verbalized understanding and will try this, he knows to call back if he continues to have problems.

## 2019-03-31 NOTE — Telephone Encounter (Signed)
Please advise about refill in Dr. Plotnikovs absence. 

## 2019-03-31 NOTE — Telephone Encounter (Signed)
Pt states that he has not had a bm since 4 days ago, he wants to know if he could be prescribed magnesium citrate or something else.

## 2019-04-07 ENCOUNTER — Telehealth: Payer: Self-pay | Admitting: Cardiology

## 2019-04-07 DIAGNOSIS — R0602 Shortness of breath: Secondary | ICD-10-CM | POA: Diagnosis not present

## 2019-04-07 NOTE — Telephone Encounter (Signed)
Mychart pending, no smartphone, consent (03/16/19), pre reg complete 04/07/19 AF

## 2019-04-08 NOTE — Progress Notes (Signed)
Virtual Visit via Telephone Note   This visit type was conducted due to national recommendations for restrictions regarding the COVID-19 Pandemic (e.g. social distancing) in an effort to limit this patient's exposure and mitigate transmission in our community.  Due to his co-morbid illnesses, this patient is at least at moderate risk for complications without adequate follow up.  This format is felt to be most appropriate for this patient at this time.  The patient did not have access to video technology/had technical difficulties with video requiring transitioning to audio format only (telephone).  All issues noted in this document were discussed and addressed.  No physical exam could be performed with this format.  Please refer to the patient's chart for his  consent to telehealth for [facility].   Date:  04/09/2019   ID:  [name] [name], DOB 02/02/1920, MRN [id number]  Patient Location: Home Provider Location: Home  PCP:  Plotnikov, Evie Lacks, MD  Cardiologist:  Minus Breeding, MD  Electrophysiologist:  None   Evaluation Performed:  Follow-Up Visit  Chief Complaint:  Dyspnea  History of Present Illness:    [name] [name] is a 85 y.o. male  who presents for follow up of atrial fibrilation. Hewas admitted in May2019with recurrent AF, AKI, and CHF.  He underwent TEE CV to NSR.   However, it appears that he has been in atrial fibrillation chronically for some time.  He has severe cardiomyopathy.  His heart failure is been somewhat difficult to manage.  He has insisted on living independently.  He does have home care who comes to see him regularly.  It sounds like they have been quite attentive.  They cannot do home lab draws.  He has had tele-visits recently with Kerin Ransom PAc.  Unfortunately he is not seem to do well.  He has had increasing shortness of breath.  There is no lower extremity swelling.  He has had increasing Demadex and he has been given a couple of courses of his torsemide  being doubled.  However, recent chest x-ray demonstrated progressive edema with pleural effusions.  When I first spoke with the patient his home health service was not there and he said that he was doing relatively well.  He said yesterday was a good day any felt well but today he was not getting up and moving around as much.  He said he is had some lower extremity swelling.  However, with his home health service got there she called me and described him as being very lethargic and progressively declining.  His weight today seems to be down from his peak of 173 and even below what we consider to be his dry weight of 170.  However, he has had progressive failure to thrive.  He has been complaining of severe fatigue and constipation.  The patient does not symptoms concerning for COVID-19 infection (fever, chills, cough, or new SHORTNESS OF BREATH).  The symptoms that he has can be ascribed to his heart failure.    Past Medical History:  Diagnosis Date  . Anxiety   . Arthritis    "shoulders" (05/28/2016)  . Cardiomyopathy   . Coronary artery disease   . Depression    hx (05/28/2016)  . Hemorrhoids   . Hyperlipidemia   . Hypertension   . Paroxysmal atrial fibrillation (HCC)   . Skipped heart beats    Past Surgical History:  Procedure Laterality Date  . APPENDECTOMY    . CARDIOVERSION N/A 06/04/2016   Procedure: CARDIOVERSION;  Surgeon:  Pixie Casino, MD;  Location: [facility] ENDOSCOPY;  Service: Cardiovascular;  Laterality: N/A;  . CARDIOVERSION N/A 04/09/2017   Procedure: CARDIOVERSION;  Surgeon: Thayer Headings, MD;  Location: [facility] ENDOSCOPY;  Service: Cardiovascular;  Laterality: N/A;  . CARDIOVERSION N/A 04/01/2018   Procedure: CARDIOVERSION;  Surgeon: Skeet Latch, MD;  Location: [place];  Service: Cardiovascular;  Laterality: N/A;  . CATARACT EXTRACTION Left   . CHOLECYSTECTOMY OPEN    . COLONOSCOPY W/ BIOPSIES AND POLYPECTOMY    . FLEXIBLE SIGMOIDOSCOPY N/A 01/01/2019   Procedure:  FLEXIBLE SIGMOIDOSCOPY;  Surgeon: Jerene Bears, MD;  Location: [facility] ENDOSCOPY;  Service: Gastroenterology;  Laterality: N/A;  . INGUINAL HERNIA REPAIR Bilateral   . MYRINGOTOMY WITH TUBE PLACEMENT Bilateral   . skin cancer removal Right    side of nose by Rt eye  . TEE WITHOUT CARDIOVERSION N/A 06/04/2016   Procedure: TRANSESOPHAGEAL ECHOCARDIOGRAM (TEE);  Surgeon: Pixie Casino, MD;  Location: [facility] ENDOSCOPY;  Service: Cardiovascular;  Laterality: N/A;  . TEE WITHOUT CARDIOVERSION N/A 04/01/2018   Procedure: TRANSESOPHAGEAL ECHOCARDIOGRAM (TEE);  Surgeon: Skeet Latch, MD;  Location: [place];  Service: Cardiovascular;  Laterality: N/A;  . TONSILLECTOMY AND ADENOIDECTOMY       Current Meds  Medication Sig  . ALPRAZolam (XANAX) 1 MG tablet TAKE ONE TABLET AT BEDTIME.  Marland Kitchen amiodarone (PACERONE) 200 MG tablet TAKE 1 TABLET ONCE DAILY.  Marland Kitchen diltiazem (CARDIZEM) 30 MG tablet TAKE 1 TABLET BY MOUTH TWICE DAILY.  Marland Kitchen ELIQUIS 2.5 MG TABS tablet TAKE 1 TABLET BY MOUTH TWICE DAILY.  . finasteride (PROSCAR) 5 MG tablet Take 1 tablet (5 mg total) by mouth daily.  Marland Kitchen lubiprostone (AMITIZA) 24 MCG capsule TAKE (1) CAPSULE TWICE DAILY.  . Multiple Vitamins-Minerals (ICAPS PO) Take 1 capsule by mouth daily.  . polyethylene glycol powder (MIRALAX) powder 1 capful BID (Patient taking differently: Take 17 g by mouth at bedtime. )  . pravastatin (PRAVACHOL) 20 MG tablet TAKE 1 TABLET EACH DAY.  . senna (SENOKOT) 8.6 MG tablet Take 1 tablet by mouth 2 (two) times daily.  Marland Kitchen torsemide (DEMADEX) 10 MG tablet Take 1 tablet (10 mg total) by mouth daily.  Marland Kitchen triamcinolone ointment (KENALOG) 0.1 % Apply 1 application topically 2 (two) times daily. To irritated skin  . VENTOLIN HFA 108 (90 Base) MCG/ACT inhaler Inhale 2 puffs into the lungs every 6 (six) hours as needed for wheezing or shortness of breath.      Allergies:   Lisinopril   Social History   Tobacco Use  . Smoking status: Never Smoker  . Smokeless  tobacco: Never Used  Substance Use Topics  . Alcohol use: No  . Drug use: No     Family Hx: The patient's family history includes Heart disease in his brother.  ROS:   Please see the history of present illness.    Positive for constipation.     All other systems reviewed and are negative.   Prior CV studies:   The following studies were reviewed today:    Labs/Other Tests and Data Reviewed:    EKG:  No ECG reviewed.  Recent Labs: 12/08/2018: ALT 7; Platelets 120 12/09/2018: BUN 28; Creatinine, Ser 1.48; Hemoglobin 16.4; Potassium 4.3; Sodium 141   Recent Lipid Panel Lab Results  Component Value Date/Time   CHOL 112 03/30/2018 05:49 PM   TRIG 80 03/30/2018 05:49 PM   HDL 39 (L) 03/30/2018 05:49 PM   CHOLHDL 2.9 03/30/2018 05:49 PM   LDLCALC 57 03/30/2018 05:49  PM    Wt Readings from Last 3 Encounters:  04/09/19 168 lb 8 oz (76.4 kg)  03/18/19 170 lb (77.1 kg)  02/11/19 174 lb (78.9 kg)     Objective:    Vital Signs:  Ht 5\' 7"  (1.702 m)   Wt 168 lb 8 oz (76.4 kg)   BMI 26.39 kg/m    VITAL SIGNS:  reviewed  ASSESSMENT & PLAN:     Chronic combined systolic and diastolic CHF (congestive heart failure) ([place]) Unfortunately it is impossible to manage him over the phone any further.  He has not wanted to consider nursing home placement or other discussions with palliative care but he clearly needs to have these discussions and I spoke at length to his home health nurse who is very involved in his care very aware and suggested that if he is feeling he would need to go to the emergency room for admission at which point we would be able to get blood work titrate his medications and pursue palliative care evaluation.  I think he is going to need extensive home health in order to be able to stay independent which has been his goal.  He is at the end of life with his advanced age and his ejection fraction of 20%.  Atrial fibrillation with RVR (HCC) Continue the meds for  now for rate control with his amiodarone and his anticoagulation.   Congestive dilated cardiomyopathy ([place]) As above.  CKD (chronic kidney disease) stage 3, GFR 30-59 ml/min (HCC) He needs to have blood work and have this followed.     Time:   Today, I have spent 30 minutes with the patient with telehealth technology discussing the above problems.     Medication Adjustments/Labs and Tests Ordered: Current medicines are reviewed at length with the patient today.  Concerns regarding medicines are outlined above.   Tests Ordered: No orders of the defined types were placed in this encounter.   Medication Changes: No orders of the defined types were placed in this encounter.   Disposition:  Follow up with me or Kerin Ransom after hospitalization.  Signed, Minus Breeding, MD  04/09/2019 3:19 PM    [facility]

## 2019-04-09 ENCOUNTER — Encounter: Payer: Self-pay | Admitting: Cardiology

## 2019-04-09 ENCOUNTER — Telehealth (INDEPENDENT_AMBULATORY_CARE_PROVIDER_SITE_OTHER): Payer: Medicare Other | Admitting: Cardiology

## 2019-04-09 VITALS — Ht 67.0 in | Wt 168.5 lb

## 2019-04-09 DIAGNOSIS — N183 Chronic kidney disease, stage 3 unspecified: Secondary | ICD-10-CM

## 2019-04-09 DIAGNOSIS — I5023 Acute on chronic systolic (congestive) heart failure: Secondary | ICD-10-CM

## 2019-04-09 NOTE — Patient Instructions (Addendum)
Medication Instructions:  INCREASE- Torsemide 20 mg daily for 3 days then back to 10 mg daily  If you need a refill on your cardiac medications before your next appointment, please call your pharmacy.  Labwork: None Ordered  Testing/Procedures: None Ordered  Follow-Up: .     Your physician recommends that you schedule a follow-up appointment in: 1 Month with [name] [name], June 29th @ 1:30 pm   At [facility], you and your health needs are our priority.  As part of our continuing mission to provide you with exceptional heart care, we have created designated Provider Care Teams.  These Care Teams include your primary Cardiologist (physician) and Advanced Practice Providers (APPs -  Physician Assistants and Nurse Practitioners) who all work together to provide you with the care you need, when you need it.  Thank you for choosing [facility] at [facility]!!

## 2019-04-16 ENCOUNTER — Other Ambulatory Visit: Payer: Self-pay

## 2019-04-16 ENCOUNTER — Encounter (HOSPITAL_COMMUNITY): Payer: Self-pay

## 2019-04-16 ENCOUNTER — Emergency Department (HOSPITAL_COMMUNITY)
Admission: EM | Admit: 2019-04-16 | Discharge: 2019-04-16 | Disposition: A | Discharge status: Home / Self Care | Payer: Medicare Other | Attending: Emergency Medicine | Admitting: Emergency Medicine

## 2019-04-16 ENCOUNTER — Emergency Department (HOSPITAL_COMMUNITY): Payer: Medicare Other

## 2019-04-16 DIAGNOSIS — S51812A Laceration without foreign body of left forearm, initial encounter: Secondary | ICD-10-CM | POA: Diagnosis not present

## 2019-04-16 DIAGNOSIS — Z7901 Long term (current) use of anticoagulants: Secondary | ICD-10-CM | POA: Insufficient documentation

## 2019-04-16 DIAGNOSIS — S0181XA Laceration without foreign body of other part of head, initial encounter: Secondary | ICD-10-CM | POA: Diagnosis not present

## 2019-04-16 DIAGNOSIS — S0990XA Unspecified injury of head, initial encounter: Secondary | ICD-10-CM

## 2019-04-16 DIAGNOSIS — S0003XA Contusion of scalp, initial encounter: Secondary | ICD-10-CM | POA: Diagnosis not present

## 2019-04-16 DIAGNOSIS — N183 Chronic kidney disease, stage 3 (moderate): Secondary | ICD-10-CM | POA: Insufficient documentation

## 2019-04-16 DIAGNOSIS — Z79899 Other long term (current) drug therapy: Secondary | ICD-10-CM | POA: Diagnosis not present

## 2019-04-16 DIAGNOSIS — S51011A Laceration without foreign body of right elbow, initial encounter: Secondary | ICD-10-CM | POA: Diagnosis not present

## 2019-04-16 DIAGNOSIS — W19XXXA Unspecified fall, initial encounter: Secondary | ICD-10-CM | POA: Diagnosis not present

## 2019-04-16 DIAGNOSIS — Y939 Activity, unspecified: Secondary | ICD-10-CM | POA: Insufficient documentation

## 2019-04-16 DIAGNOSIS — I2510 Atherosclerotic heart disease of native coronary artery without angina pectoris: Secondary | ICD-10-CM | POA: Diagnosis not present

## 2019-04-16 DIAGNOSIS — Y999 Unspecified external cause status: Secondary | ICD-10-CM | POA: Diagnosis not present

## 2019-04-16 DIAGNOSIS — R58 Hemorrhage, not elsewhere classified: Secondary | ICD-10-CM | POA: Diagnosis not present

## 2019-04-16 DIAGNOSIS — Y929 Unspecified place or not applicable: Secondary | ICD-10-CM | POA: Diagnosis not present

## 2019-04-16 DIAGNOSIS — S0083XA Contusion of other part of head, initial encounter: Secondary | ICD-10-CM | POA: Diagnosis not present

## 2019-04-16 DIAGNOSIS — S199XXA Unspecified injury of neck, initial encounter: Secondary | ICD-10-CM | POA: Diagnosis not present

## 2019-04-16 DIAGNOSIS — I130 Hypertensive heart and chronic kidney disease with heart failure and stage 1 through stage 4 chronic kidney disease, or unspecified chronic kidney disease: Secondary | ICD-10-CM | POA: Insufficient documentation

## 2019-04-16 DIAGNOSIS — I5032 Chronic diastolic (congestive) heart failure: Secondary | ICD-10-CM | POA: Insufficient documentation

## 2019-04-16 DIAGNOSIS — R0902 Hypoxemia: Secondary | ICD-10-CM | POA: Diagnosis not present

## 2019-04-16 DIAGNOSIS — I10 Essential (primary) hypertension: Secondary | ICD-10-CM | POA: Diagnosis not present

## 2019-04-16 DIAGNOSIS — W010XXA Fall on same level from slipping, tripping and stumbling without subsequent striking against object, initial encounter: Secondary | ICD-10-CM | POA: Diagnosis not present

## 2019-04-16 MED ORDER — LIDOCAINE-EPINEPHRINE (PF) 2 %-1:200000 IJ SOLN
INTRAMUSCULAR | Status: AC
  Administered 2019-04-16: 10:00:00
  Filled 2019-04-16: qty 20

## 2019-04-16 NOTE — ED Triage Notes (Signed)
Pt from home via ems; pt leaning over to pick up mail this morning, lost balance and fell forward, hitting head; no loc, pt on Elequis; lac to R forehead, small lac to bridge of nose; skin tear to r arm; pt blind in R eye; a and o x 4; pt found prone on ems arrial; denies neck ore back pain

## 2019-04-16 NOTE — ED Notes (Signed)
Pt ambulatory in hall with steady gait with walker assistance

## 2019-04-16 NOTE — ED Notes (Signed)
Pt verbalized understanding of d/c instructions and has no further questions. Pt's neighbor Waunita Schooner is coming to get him. Pt is being placed in purple zone until then. Pt ambulated well with a walker.

## 2019-04-16 NOTE — ED Notes (Signed)
Performed peri-care on pt and applied new brief.

## 2019-04-16 NOTE — ED Notes (Signed)
Pt on phone with son Clair Gulling); pt has made arrangements for pick up by neighbor Waunita Schooner) once discharged; Clair Gulling aware

## 2019-04-16 NOTE — ED Notes (Signed)
Pt's son Clair Gulling) updated on pt's status and plan of care (with pt's permission)

## 2019-04-16 NOTE — ED Notes (Signed)
Patient transported to CT 
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
## 2019-04-16 NOTE — ED Notes (Signed)
Per Claiborne Billings in CT, pt to be transported for scan in approx 15 minutes

## 2019-04-16 NOTE — Discharge Instructions (Addendum)
1.  Rest with your head elevated at 30 degrees at all times.  Try to apply a well wrapped ice pack to your forehead and nose.  Frozen vegetables work well.  Use a towel between them to protect your skin.  Leave the dressing on your arm in place for a week.  You may change the outer dressings but leave the yellow gauze in place.  Leave your bandage on your head for the next 12 hours.  If it is too tight, you may loosen it.  If there is oozing from your cuts, try applying constant pressure for 15 to 20 minutes.  If the bleeding does not stop, return to the emergency department. 2.  Take extra strength Tylenol for pain as needed. 3.  Doppler taking your Eliquis for the next 3 days.  Call your doctor to get a recheck within the next 2 to 3 days.  You will need to discuss restarting Eliquis. 4.  Return to the emergency department if you have confusion, bad headache not improved by Tylenol, any concerning symptoms.

## 2019-04-19 ENCOUNTER — Telehealth: Payer: Self-pay | Admitting: Internal Medicine

## 2019-04-19 ENCOUNTER — Ambulatory Visit: Payer: Medicare Other | Admitting: Physician Assistant

## 2019-04-19 DIAGNOSIS — W19XXXA Unspecified fall, initial encounter: Secondary | ICD-10-CM

## 2019-04-19 DIAGNOSIS — R05 Cough: Secondary | ICD-10-CM | POA: Diagnosis not present

## 2019-04-19 NOTE — Telephone Encounter (Signed)
Copied from [place] [id number]. Topic: General - Other >> Apr 19, 2019 11:31 AM Oneta Rack wrote: Caller name: Vicky  Relation to pt: Nurse case manger [facility]  Call back number: [phone]    Reason for call:  Patient had a bad fall face was bruised, patient was seen in the ED on Friday. Nurse visited patient on Saturday, patient has 2 black eyes, stiches. NP requesting home health aid assistance, wound care, PT and OT please place orders with [facility]. Vicky from [facility] would like a follow up call when orders are placed.

## 2019-04-20 NOTE — Telephone Encounter (Signed)
Okay to place orders 

## 2019-04-20 NOTE — Telephone Encounter (Signed)
[name] [name]  Is [name] [name] planning to move to a senior apartment living type of arrangement? Thanks

## 2019-04-21 NOTE — Telephone Encounter (Signed)
Okay given to Tine. [name] [name] would like home health agency orders. Please advise    Call back phone number [phone]

## 2019-04-21 NOTE — Telephone Encounter (Signed)
Pt is planning on staying at home. Referral for home health placed

## 2019-04-22 NOTE — Telephone Encounter (Signed)
[facility] notified orders faxed

## 2019-04-22 NOTE — Telephone Encounter (Signed)
[name] [name] from [facility] is calling to request if the wound care order can be faxed ASAP [phone]  Trying to get someone out for wound care in the morning. CB- [phone]

## 2019-04-24 DIAGNOSIS — I130 Hypertensive heart and chronic kidney disease with heart failure and stage 1 through stage 4 chronic kidney disease, or unspecified chronic kidney disease: Secondary | ICD-10-CM | POA: Diagnosis not present

## 2019-04-24 DIAGNOSIS — I480 Paroxysmal atrial fibrillation: Secondary | ICD-10-CM | POA: Diagnosis not present

## 2019-04-24 DIAGNOSIS — S0121XD Laceration without foreign body of nose, subsequent encounter: Secondary | ICD-10-CM | POA: Diagnosis not present

## 2019-04-24 DIAGNOSIS — I5042 Chronic combined systolic (congestive) and diastolic (congestive) heart failure: Secondary | ICD-10-CM | POA: Diagnosis not present

## 2019-04-24 DIAGNOSIS — M19012 Primary osteoarthritis, left shoulder: Secondary | ICD-10-CM | POA: Diagnosis not present

## 2019-04-24 DIAGNOSIS — S51011D Laceration without foreign body of right elbow, subsequent encounter: Secondary | ICD-10-CM | POA: Diagnosis not present

## 2019-04-24 DIAGNOSIS — N183 Chronic kidney disease, stage 3 (moderate): Secondary | ICD-10-CM | POA: Diagnosis not present

## 2019-04-24 DIAGNOSIS — I2510 Atherosclerotic heart disease of native coronary artery without angina pectoris: Secondary | ICD-10-CM | POA: Diagnosis not present

## 2019-04-24 DIAGNOSIS — M19011 Primary osteoarthritis, right shoulder: Secondary | ICD-10-CM | POA: Diagnosis not present

## 2019-04-24 DIAGNOSIS — S0181XD Laceration without foreign body of other part of head, subsequent encounter: Secondary | ICD-10-CM | POA: Diagnosis not present

## 2019-05-03 ENCOUNTER — Other Ambulatory Visit: Payer: Self-pay | Admitting: Internal Medicine

## 2019-05-03 DIAGNOSIS — R0602 Shortness of breath: Secondary | ICD-10-CM | POA: Diagnosis not present

## 2019-05-04 NOTE — Telephone Encounter (Signed)
Check [place] registry last filled 04/15/2019.Marland KitchenJohny Chess

## 2019-05-10 ENCOUNTER — Encounter: Payer: Self-pay | Admitting: Internal Medicine

## 2019-05-10 ENCOUNTER — Other Ambulatory Visit: Payer: Self-pay

## 2019-05-10 ENCOUNTER — Ambulatory Visit (INDEPENDENT_AMBULATORY_CARE_PROVIDER_SITE_OTHER): Payer: Medicare Other | Admitting: Internal Medicine

## 2019-05-10 ENCOUNTER — Other Ambulatory Visit (INDEPENDENT_AMBULATORY_CARE_PROVIDER_SITE_OTHER): Payer: Medicare Other

## 2019-05-10 VITALS — BP 98/62 | HR 54 | Temp 97.9°F | Ht 68.0 in

## 2019-05-10 DIAGNOSIS — R627 Adult failure to thrive: Secondary | ICD-10-CM

## 2019-05-10 DIAGNOSIS — R296 Repeated falls: Secondary | ICD-10-CM

## 2019-05-10 DIAGNOSIS — I2510 Atherosclerotic heart disease of native coronary artery without angina pectoris: Secondary | ICD-10-CM

## 2019-05-10 DIAGNOSIS — R202 Paresthesia of skin: Secondary | ICD-10-CM

## 2019-05-10 DIAGNOSIS — F5101 Primary insomnia: Secondary | ICD-10-CM

## 2019-05-10 DIAGNOSIS — I10 Essential (primary) hypertension: Secondary | ICD-10-CM

## 2019-05-10 DIAGNOSIS — I5022 Chronic systolic (congestive) heart failure: Secondary | ICD-10-CM

## 2019-05-10 DIAGNOSIS — E559 Vitamin D deficiency, unspecified: Secondary | ICD-10-CM

## 2019-05-10 DIAGNOSIS — I5042 Chronic combined systolic (congestive) and diastolic (congestive) heart failure: Secondary | ICD-10-CM

## 2019-05-10 LAB — BASIC METABOLIC PANEL
BUN: 34 mg/dL — ABNORMAL HIGH (ref 6–23)
CO2: 30 mEq/L (ref 19–32)
Calcium: 8.6 mg/dL (ref 8.4–10.5)
Chloride: 105 mEq/L (ref 96–112)
Creatinine, Ser: 1.67 mg/dL — ABNORMAL HIGH (ref 0.40–1.50)
GFR: 38.23 mL/min — ABNORMAL LOW (ref >60.00)
Glucose, Bld: 109 mg/dL — ABNORMAL HIGH (ref 70–99)
Potassium: 5 mEq/L (ref 3.5–5.1)
Sodium: 143 mEq/L (ref 135–145)

## 2019-05-10 LAB — CBC WITH DIFFERENTIAL/PLATELET
Basophils Absolute: 0 10*3/uL (ref 0.0–0.1)
Basophils Relative: 0.7 % (ref 0.0–3.0)
Eosinophils Absolute: 0.1 10*3/uL (ref 0.0–0.7)
Eosinophils Relative: 1.2 % (ref 0.0–5.0)
HCT: 46 % (ref 39.0–52.0)
Hemoglobin: 15 g/dL (ref 13.0–17.0)
Lymphocytes Relative: 11.7 % — ABNORMAL LOW (ref 12.0–46.0)
Lymphs Abs: 0.6 10*3/uL — ABNORMAL LOW (ref 0.7–4.0)
MCHC: 32.5 g/dL (ref 30.0–36.0)
MCV: 97 fl (ref 78.0–100.0)
Monocytes Absolute: 0.6 10*3/uL (ref 0.1–1.0)
Monocytes Relative: 10.6 % (ref 3.0–12.0)
Neutro Abs: 4 10*3/uL (ref 1.4–7.7)
Neutrophils Relative %: 75.8 % (ref 43.0–77.0)
Platelets: 151 10*3/uL (ref 150.0–400.0)
RBC: 4.74 Mil/uL (ref 4.22–5.81)
RDW: 16 % — ABNORMAL HIGH (ref 11.5–15.5)
WBC: 5.3 10*3/uL (ref 4.0–10.5)

## 2019-05-10 LAB — VITAMIN D 25 HYDROXY (VIT D DEFICIENCY, FRACTURES): VITD: 43.09 ng/mL (ref 30.00–100.00)

## 2019-05-10 LAB — VITAMIN B12: Vitamin B-12: 383 pg/mL (ref 211–911)

## 2019-05-10 NOTE — Assessment & Plan Note (Signed)
F/u w/Dr Percival Spanish

## 2019-05-10 NOTE — Assessment & Plan Note (Signed)
Eliquis, Pravastatin

## 2019-05-10 NOTE — Progress Notes (Signed)
Subjective:  Patient ID: [name] [name], male    DOB: [birth year]  Age: 85 y.o. MRN: [id number]  CC: No chief complaint on file.   HPI Zackery Brine presents for frequent falls, anxiety, anticoagulation. Kristofor tripped and fell twice in the past two weeks. He sustained a forehead laceration. Son and girlfriend are helping out at home  Outpatient Medications Prior to Visit  Medication Sig Dispense Refill   ALPRAZolam (XANAX) 1 MG tablet TAKE ONE TABLET AT BEDTIME. 30 tablet 1   amiodarone (PACERONE) 200 MG tablet TAKE 1 TABLET ONCE DAILY. 30 tablet 11   diltiazem (CARDIZEM) 30 MG tablet TAKE 1 TABLET BY MOUTH TWICE DAILY. 60 tablet 6   ELIQUIS 2.5 MG TABS tablet TAKE 1 TABLET BY MOUTH TWICE DAILY. 60 tablet 11   finasteride (PROSCAR) 5 MG tablet Take 1 tablet (5 mg total) by mouth daily. 90 tablet 3   lubiprostone (AMITIZA) 24 MCG capsule TAKE (1) CAPSULE TWICE DAILY. 60 capsule 2   Multiple Vitamins-Minerals (ICAPS PO) Take 1 capsule by mouth daily.     polyethylene glycol powder (MIRALAX) powder 1 capful BID (Patient taking differently: Take 17 g by mouth at bedtime. ) 255 g 0   pravastatin (PRAVACHOL) 20 MG tablet TAKE 1 TABLET EACH DAY. 30 tablet 11   senna (SENOKOT) 8.6 MG tablet Take 1 tablet by mouth 2 (two) times daily.     torsemide (DEMADEX) 10 MG tablet Take 1 tablet (10 mg total) by mouth daily. 30 tablet 11   triamcinolone ointment (KENALOG) 0.1 % Apply 1 application topically 2 (two) times daily. To irritated skin 80 g 3   VENTOLIN HFA 108 (90 Base) MCG/ACT inhaler Inhale 2 puffs into the lungs every 6 (six) hours as needed for wheezing or shortness of breath.   0   No facility-administered medications prior to visit.     ROS: Review of Systems  Constitutional: Positive for fatigue. Negative for appetite change and unexpected weight change.  HENT: Negative for congestion, nosebleeds, sneezing, sore throat and trouble swallowing.   Eyes: Negative for itching and  visual disturbance.  Respiratory: Positive for shortness of breath. Negative for cough and wheezing.   Cardiovascular: Negative for chest pain, palpitations and leg swelling.  Gastrointestinal: Negative for abdominal distention, blood in stool, diarrhea and nausea.  Genitourinary: Negative for frequency and hematuria.  Musculoskeletal: Positive for arthralgias and gait problem. Negative for back pain, joint swelling and neck pain.  Skin: Negative for rash.  Neurological: Positive for weakness. Negative for dizziness, tremors and speech difficulty.  Psychiatric/Behavioral: Negative for agitation, dysphoric mood, sleep disturbance and suicidal ideas. The patient is not nervous/anxious.     Objective:  BP 98/62 (BP Location: Right Arm, Patient Position: Sitting, Cuff Size: Normal)    Pulse (!) 54    Temp 97.9 F (36.6 C) (Oral)    Ht 5\' 8"  (1.727 m)    SpO2 96%    BMI 25.85 kg/m   BP Readings from Last 3 Encounters:  05/10/19 98/62  04/16/19 (!) 140/96  02/11/19 106/64    Wt Readings from Last 3 Encounters:  04/16/19 170 lb (77.1 kg)  04/09/19 168 lb 8 oz (76.4 kg)  03/18/19 170 lb (77.1 kg)    Physical Exam Constitutional:      General: He is not in acute distress.    Appearance: He is well-developed.     Comments: NAD  Eyes:     Conjunctiva/sclera: Conjunctivae normal.     Pupils: Pupils are  equal, round, and reactive to light.  Neck:     Musculoskeletal: Normal range of motion.     Thyroid: No thyromegaly.     Vascular: No JVD.  Cardiovascular:     Rate and Rhythm: Regular rhythm. Bradycardia present.     Heart sounds: Normal heart sounds. No murmur. No friction rub. No gallop.   Pulmonary:     Effort: Pulmonary effort is normal. No respiratory distress.     Breath sounds: No wheezing or rales.  Chest:     Chest wall: No tenderness.  Abdominal:     General: Bowel sounds are normal. There is no distension.     Palpations: Abdomen is soft. There is no mass.      Tenderness: There is no abdominal tenderness. There is no guarding or rebound.  Musculoskeletal: Normal range of motion.        General: Tenderness present.  Lymphadenopathy:     Cervical: No cervical adenopathy.  Skin:    General: Skin is warm and dry.     Findings: No rash.  Neurological:     Mental Status: He is alert and oriented to person, place, and time.     Cranial Nerves: No cranial nerve deficit.     Motor: No abnormal muscle tone.     Coordination: Coordination abnormal.     Gait: Gait abnormal.     Deep Tendon Reflexes: Reflexes are normal and symmetric.  Psychiatric:        Behavior: Behavior normal.        Thought Content: Thought content normal.        Judgment: Judgment normal.   in a w/c Scars on face - healing Ataxic Alert, cooperative decr BS at bases B  Lab Results  Component Value Date   WBC 6.7 12/08/2018   HGB 16.4 12/09/2018   HCT 51.4 12/09/2018   PLT 120 (L) 12/08/2018   GLUCOSE 92 12/09/2018   CHOL 112 03/30/2018   TRIG 80 03/30/2018   HDL 39 (L) 03/30/2018   LDLCALC 57 03/30/2018   ALT 7 12/08/2018   AST 20 12/08/2018   NA 141 12/09/2018   K 4.3 12/09/2018   CL 106 12/09/2018   CREATININE 1.48 (H) 12/09/2018   BUN 28 (H) 12/09/2018   CO2 28 12/09/2018   TSH 1.681 03/30/2018   PSA 0.24 03/08/2014   INR 1.28 12/08/2018   HGBA1C 5.7 (H) 03/31/2018    Ct Head Wo Contrast  Result Date: 04/16/2019 CLINICAL DATA:  Pt fell while bending over to get his mail. Laceration to RIGHT forehead. No loss of consciousness. EXAM: CT HEAD WITHOUT CONTRAST CT CERVICAL SPINE WITHOUT CONTRAST TECHNIQUE: Multidetector CT imaging of the head and cervical spine was performed following the standard protocol without intravenous contrast. Multiplanar CT image reconstructions of the cervical spine were also generated. COMPARISON:  04/06/2017 FINDINGS: CT HEAD FINDINGS Brain: No evidence of acute infarction, hemorrhage, hydrocephalus, extra-axial collection or mass  lesion/mass effect. Vascular: No hyperdense vessel or unexpected calcification. Skull: No osseous abnormality. Sinuses/Orbits: Visualized paranasal sinuses are clear. Visualized mastoid sinuses are clear. Visualized orbits demonstrate no focal abnormality. Other: Right frontal scalp hematoma. CT CERVICAL SPINE FINDINGS Alignment: Normal. Skull base and vertebrae: No acute fracture. No primary bone lesion or focal pathologic process. Soft tissues and spinal canal: No prevertebral fluid or swelling. No visible canal hematoma. Disc levels: Degenerative disease with disc height loss at C3-4 on C5-6 and C6-7. Degenerative disc disease with disc height loss and anterior bridging  osteophytes at T2-3, T3-4 and T4-5. Moderate left facet arthropathy at C2-3 and osseous fusion of the right posterior elements. At C3-4 there is moderate left facet arthropathy and osseous fusion of the right posterior elements. At C4-5 there is moderate left facet arthropathy and moderate right foraminal stenosis. At C5-6 there is mild bilateral foraminal stenosis. C6-7 there is a broad-based disc osteophyte complex and bilateral foraminal stenosis. Upper chest: Moderate bilateral pleural effusions. Thoracic aortic atherosclerosis. Other: No fluid collection or hematoma. IMPRESSION: 1. No acute intracranial pathology. 2.  No acute osseous injury of the cervical spine. 3. Moderate bilateral pleural effusions. 4. Electronically Signed   By: Kathreen Devoid   On: 04/16/2019 11:20   Ct Cervical Spine Wo Contrast  Result Date: 04/16/2019 CLINICAL DATA:  Pt fell while bending over to get his mail. Laceration to RIGHT forehead. No loss of consciousness. EXAM: CT HEAD WITHOUT CONTRAST CT CERVICAL SPINE WITHOUT CONTRAST TECHNIQUE: Multidetector CT imaging of the head and cervical spine was performed following the standard protocol without intravenous contrast. Multiplanar CT image reconstructions of the cervical spine were also generated. COMPARISON:   04/06/2017 FINDINGS: CT HEAD FINDINGS Brain: No evidence of acute infarction, hemorrhage, hydrocephalus, extra-axial collection or mass lesion/mass effect. Vascular: No hyperdense vessel or unexpected calcification. Skull: No osseous abnormality. Sinuses/Orbits: Visualized paranasal sinuses are clear. Visualized mastoid sinuses are clear. Visualized orbits demonstrate no focal abnormality. Other: Right frontal scalp hematoma. CT CERVICAL SPINE FINDINGS Alignment: Normal. Skull base and vertebrae: No acute fracture. No primary bone lesion or focal pathologic process. Soft tissues and spinal canal: No prevertebral fluid or swelling. No visible canal hematoma. Disc levels: Degenerative disease with disc height loss at C3-4 on C5-6 and C6-7. Degenerative disc disease with disc height loss and anterior bridging osteophytes at T2-3, T3-4 and T4-5. Moderate left facet arthropathy at C2-3 and osseous fusion of the right posterior elements. At C3-4 there is moderate left facet arthropathy and osseous fusion of the right posterior elements. At C4-5 there is moderate left facet arthropathy and moderate right foraminal stenosis. At C5-6 there is mild bilateral foraminal stenosis. C6-7 there is a broad-based disc osteophyte complex and bilateral foraminal stenosis. Upper chest: Moderate bilateral pleural effusions. Thoracic aortic atherosclerosis. Other: No fluid collection or hematoma. IMPRESSION: 1. No acute intracranial pathology. 2.  No acute osseous injury of the cervical spine. 3. Moderate bilateral pleural effusions. 4. Electronically Signed   By: Kathreen Devoid   On: 04/16/2019 11:20    Assessment & Plan:   There are no diagnoses linked to this encounter.   No orders of the defined types were placed in this encounter.    Follow-up: No follow-ups on file.  Walker Kehr, MD

## 2019-05-10 NOTE — Assessment & Plan Note (Signed)
?  [name] [name]

## 2019-05-10 NOTE — Assessment & Plan Note (Signed)
Xanax low dose prn 

## 2019-05-10 NOTE — Assessment & Plan Note (Addendum)
Discussed Assisted Living, ie [facility] Cardiol appt 6/29

## 2019-05-10 NOTE — Assessment & Plan Note (Signed)
Discussed Assisted Living, ie [facility] We may need to d/c Eliquis

## 2019-05-10 NOTE — Assessment & Plan Note (Signed)
Toprol, Furosemide 

## 2019-05-17 ENCOUNTER — Telehealth: Payer: Medicare Other | Admitting: Cardiology

## 2019-05-17 ENCOUNTER — Other Ambulatory Visit: Payer: Self-pay

## 2019-05-17 ENCOUNTER — Ambulatory Visit (INDEPENDENT_AMBULATORY_CARE_PROVIDER_SITE_OTHER): Payer: Medicare Other | Admitting: Cardiology

## 2019-05-17 ENCOUNTER — Encounter: Payer: Self-pay | Admitting: Cardiology

## 2019-05-17 VITALS — BP 105/64 | HR 53 | Ht 68.0 in | Wt 166.0 lb

## 2019-05-17 DIAGNOSIS — I5042 Chronic combined systolic (congestive) and diastolic (congestive) heart failure: Secondary | ICD-10-CM | POA: Diagnosis not present

## 2019-05-17 DIAGNOSIS — Z7901 Long term (current) use of anticoagulants: Secondary | ICD-10-CM | POA: Diagnosis not present

## 2019-05-17 DIAGNOSIS — N183 Chronic kidney disease, stage 3 unspecified: Secondary | ICD-10-CM

## 2019-05-17 DIAGNOSIS — I482 Chronic atrial fibrillation, unspecified: Secondary | ICD-10-CM | POA: Diagnosis not present

## 2019-05-17 DIAGNOSIS — W19XXXA Unspecified fall, initial encounter: Secondary | ICD-10-CM

## 2019-05-17 DIAGNOSIS — Y92009 Unspecified place in unspecified non-institutional (private) residence as the place of occurrence of the external cause: Secondary | ICD-10-CM

## 2019-05-17 NOTE — Patient Instructions (Addendum)
Medication Instructions:  TAKE your Proscar in the evening If you need a refill on your cardiac medications before your next appointment, please call your pharmacy.   Lab work: None  If you have labs (blood work) drawn today and your tests are completely normal, you will receive your results only by: Marland Kitchen MyChart Message (if you have MyChart) OR . A paper copy in the mail If you have any lab test that is abnormal or we need to change your treatment, we will call you to review the results.  Testing/Procedures: None   Follow-Up: At [facility], you and your health needs are our priority.  As part of our continuing mission to provide you with exceptional heart care, we have created designated Provider Care Teams.  These Care Teams include your primary Cardiologist (physician) and Advanced Practice Providers (APPs -  Physician Assistants and Nurse Practitioners) who all work together to provide you with the care you need, when you need it. You will need a follow up appointment in 4 months.  Please call our office 2 months in advance to schedule this appointment.  You may see Minus Breeding, MD or one of the following Advanced Practice Providers on your designated Care Team: Almyra Deforest, [place] . Fabian Sharp, PA-C .   Any Other Special Instructions Will Be Listed Below (If Applicable).

## 2019-05-17 NOTE — Progress Notes (Signed)
Cardiology Office Note:    Date:  05/17/2019   ID:  [name] [name], DOB Sep 23, 1920, MRN [id number]  PCP:  [name] Anger, MD  Cardiologist:  [name] Breeding, MD  Electrophysiologist:  None   Referring MD: [name] Anger, MD   Chief Complaint  Patient presents with  . Follow-up    walk-in having some shortness of breath    History of Present Illness:    [name] [name] is a pleasant 85 y.o. male with a hx of atrial fibrilation. Hewas admitted in May2019with recurrent AF, AKI, and CHF. His weight was up to192 lbs, almost 20 lbs over his baseline. He was admitted and placed on Diltiazem for rate control. He was re loaded with Amiodarone. He underwent TEE CV to NSR. When dischargedhome his weight was down to 173 lbs.As an OP he has gone back into atrial fibrillation but he has tolerated this better. His rate has been controlled and his weight remains stable. He is on Amiodarone for rate control.  He has chronic LE edema and borderline low B/P.  He had a virtual visit today but decided to come to the office to be seen in person.  Overall he seems to be doing OK though I have noticed a gradual decline in his appearance and strength.  He fell once since his LOV, "I tripped".  He says he is eating and drinking OK.  Recent BUN/Cr was near his baseline- 34/1.67.  His weigh is down a couple pounds.  His main complaint is that he is not sleeping well, he wakes and is unable to fall back asleep.   Past Medical History:  Diagnosis Date  . Anxiety   . Arthritis    "shoulders" (05/28/2016)  . Cardiomyopathy   . Coronary artery disease   . Depression    hx (05/28/2016)  . Hemorrhoids   . Hyperlipidemia   . Hypertension   . Paroxysmal atrial fibrillation (HCC)   . Skipped heart beats     Past Surgical History:  Procedure Laterality Date  . APPENDECTOMY    . CARDIOVERSION N/A 06/04/2016   Procedure: CARDIOVERSION;  Surgeon: Pixie Casino, MD;  Location: [facility] ENDOSCOPY;  Service:  Cardiovascular;  Laterality: N/A;  . CARDIOVERSION N/A 04/09/2017   Procedure: CARDIOVERSION;  Surgeon: Thayer Headings, MD;  Location: [facility] ENDOSCOPY;  Service: Cardiovascular;  Laterality: N/A;  . CARDIOVERSION N/A 04/01/2018   Procedure: CARDIOVERSION;  Surgeon: Skeet Latch, MD;  Location: [place];  Service: Cardiovascular;  Laterality: N/A;  . CATARACT EXTRACTION Left   . CHOLECYSTECTOMY OPEN    . COLONOSCOPY W/ BIOPSIES AND POLYPECTOMY    . FLEXIBLE SIGMOIDOSCOPY N/A 01/01/2019   Procedure: FLEXIBLE SIGMOIDOSCOPY;  Surgeon: Jerene Bears, MD;  Location: [facility] ENDOSCOPY;  Service: Gastroenterology;  Laterality: N/A;  . INGUINAL HERNIA REPAIR Bilateral   . MYRINGOTOMY WITH TUBE PLACEMENT Bilateral   . skin cancer removal Right    side of nose by Rt eye  . TEE WITHOUT CARDIOVERSION N/A 06/04/2016   Procedure: TRANSESOPHAGEAL ECHOCARDIOGRAM (TEE);  Surgeon: Pixie Casino, MD;  Location: [facility] ENDOSCOPY;  Service: Cardiovascular;  Laterality: N/A;  . TEE WITHOUT CARDIOVERSION N/A 04/01/2018   Procedure: TRANSESOPHAGEAL ECHOCARDIOGRAM (TEE);  Surgeon: Skeet Latch, MD;  Location: [facility];  Service: Cardiovascular;  Laterality: N/A;  . TONSILLECTOMY AND ADENOIDECTOMY      Current Medications: Current Meds  Medication Sig  . ALPRAZolam (XANAX) 1 MG tablet TAKE ONE TABLET AT BEDTIME.  Marland Kitchen amiodarone (PACERONE) 200 MG tablet TAKE  1 TABLET ONCE DAILY.  Marland Kitchen diltiazem (CARDIZEM) 30 MG tablet TAKE 1 TABLET BY MOUTH TWICE DAILY.  Marland Kitchen ELIQUIS 2.5 MG TABS tablet TAKE 1 TABLET BY MOUTH TWICE DAILY.  . finasteride (PROSCAR) 5 MG tablet Take 1 tablet (5 mg total) by mouth daily.  Marland Kitchen lubiprostone (AMITIZA) 24 MCG capsule TAKE (1) CAPSULE TWICE DAILY.  . Multiple Vitamins-Minerals (ICAPS PO) Take 1 capsule by mouth daily.  . polyethylene glycol powder (MIRALAX) powder 1 capful BID (Patient taking differently: Take 17 g by mouth at bedtime. )  . pravastatin (PRAVACHOL) 20 MG tablet TAKE 1 TABLET  EACH DAY.  . senna (SENOKOT) 8.6 MG tablet Take 1 tablet by mouth 2 (two) times daily.  Marland Kitchen torsemide (DEMADEX) 10 MG tablet Take 1 tablet (10 mg total) by mouth daily.  Marland Kitchen triamcinolone ointment (KENALOG) 0.1 % Apply 1 application topically 2 (two) times daily. To irritated skin  . VENTOLIN HFA 108 (90 Base) MCG/ACT inhaler Inhale 2 puffs into the lungs every 6 (six) hours as needed for wheezing or shortness of breath.      Allergies:   Lisinopril   Social History   Socioeconomic History  . Marital status: Widowed    Spouse name: Not on file  . Number of children: 1  . Years of education: Not on file  . Highest education level: Not on file  Occupational History  . Occupation: Retired  Scientific laboratory technician  . Financial resource strain: Not on file  . Food insecurity    Worry: Not on file    Inability: Not on file  . Transportation needs    Medical: Not on file    Non-medical: Not on file  Tobacco Use  . Smoking status: Never Smoker  . Smokeless tobacco: Never Used  Substance and Sexual Activity  . Alcohol use: No  . Drug use: No  . Sexual activity: Yes  Lifestyle  . Physical activity    Days per week: Not on file    Minutes per session: Not on file  . Stress: Not on file  Relationships  . Social Herbalist on phone: Not on file    Gets together: Not on file    Attends religious service: Not on file    Active member of club or organization: Not on file    Attends meetings of clubs or organizations: Not on file    Relationship status: Not on file  Other Topics Concern  . Not on file  Social History Narrative  . Not on file     Family History: The patient's family history includes Heart disease in his brother.  ROS:   Please see the history of present illness.     All other systems reviewed and are negative.  EKGs/Labs/Other Studies Reviewed:    The following studies were reviewed today:   EKG:  EKG is not ordered today.    Recent Labs: 12/08/2018: ALT  7 05/10/2019: BUN 34; Creatinine, Ser 1.67; Hemoglobin 15.0; Platelets 151.0; Potassium 5.0; Sodium 143  Recent Lipid Panel    Component Value Date/Time   CHOL 112 03/30/2018 1749   TRIG 80 03/30/2018 1749   HDL 39 (L) 03/30/2018 1749   CHOLHDL 2.9 03/30/2018 1749   VLDL 16 03/30/2018 1749   LDLCALC 57 03/30/2018 1749    Physical Exam:    VS:  BP 105/64   Pulse (!) 53   Ht 5\' 8"  (1.727 m)   Wt 166 lb (75.3 kg)   BMI  25.24 kg/m     Wt Readings from Last 3 Encounters:  05/17/19 166 lb (75.3 kg)  04/16/19 170 lb (77.1 kg)  04/09/19 168 lb 8 oz (76.4 kg)     GEN: Elderly caucasian male, in no acute distress HEENT: Normal NECK: No JVD; No carotid bruits CARDIAC: irregularly irregular, no murmurs, rubs, gallops RESPIRATORY:  Decreased breath sounds overall MUSCULOSKELETAL:  1+ LE edema No deformity  SKIN: Warm and dry NEUROLOGIC:  Alert and oriented x 3 PSYCHIATRIC:  Normal affect   ASSESSMENT:     Chronic combined systolic and diastolic CHF (congestive heart failure) (HCC) Weight now 166lbs and stable.  Atrial fibrillation with RVR (HCC)   CAF. Continue Amiodarone for rate control.  Congestive dilated cardiomyopathy (HCC) EF 20% by TEE May 2019 when he was in AF-no point in repeating this  CKD (chronic kidney disease) stage 3, GFR 30-59 ml/min (HCC) GFR 28- SCr 1.67, GFR 38 05/10/2019  Chronic anticoagulation Low dose Eliquis  PLAN:    Same Rx- dr Percival Spanish in the fall.  He has a f/u with his PCP in Aug- will defer labs to dr Plotnikov.   Medication Adjustments/Labs and Tests Ordered: Current medicines are reviewed at length with the patient today.  Concerns regarding medicines are outlined above.  No orders of the defined types were placed in this encounter.  No orders of the defined types were placed in this encounter.   Patient Instructions  Medication Instructions:  TAKE your Proscar in the evening If you need a refill on your cardiac  medications before your next appointment, please call your pharmacy.   Lab work: None  If you have labs (blood work) drawn today and your tests are completely normal, you will receive your results only by: Marland Kitchen MyChart Message (if you have MyChart) OR . A paper copy in the mail If you have any lab test that is abnormal or we need to change your treatment, we will call you to review the results.  Testing/Procedures: None   Follow-Up: At [facility], you and your health needs are our priority.  As part of our continuing mission to provide you with exceptional heart care, we have created designated Provider Care Teams.  These Care Teams include your primary Cardiologist (physician) and Advanced Practice Providers (APPs -  Physician Assistants and Nurse Practitioners) who all work together to provide you with the care you need, when you need it. You will need a follow up appointment in 4 months.  Please call our office 2 months in advance to schedule this appointment.  You may see [name] Breeding, MD or one of the following Advanced Practice Providers on your designated Care Team: Almyra Deforest, [place] . Fabian Sharp, PA-C .   Any Other Special Instructions Will Be Listed Below (If Applicable).      Signed, Kerin Ransom, PA-C  05/17/2019 2:42 PM    [facility]

## 2019-05-19 DIAGNOSIS — R0602 Shortness of breath: Secondary | ICD-10-CM | POA: Diagnosis not present

## 2019-05-31 DIAGNOSIS — R0602 Shortness of breath: Secondary | ICD-10-CM | POA: Diagnosis not present

## 2019-05-31 DIAGNOSIS — J811 Chronic pulmonary edema: Secondary | ICD-10-CM | POA: Diagnosis not present

## 2019-06-01 DIAGNOSIS — I480 Paroxysmal atrial fibrillation: Secondary | ICD-10-CM

## 2019-06-01 DIAGNOSIS — Z87440 Personal history of urinary (tract) infections: Secondary | ICD-10-CM

## 2019-06-01 DIAGNOSIS — E785 Hyperlipidemia, unspecified: Secondary | ICD-10-CM

## 2019-06-01 DIAGNOSIS — S0181XD Laceration without foreign body of other part of head, subsequent encounter: Secondary | ICD-10-CM | POA: Diagnosis not present

## 2019-06-01 DIAGNOSIS — M19012 Primary osteoarthritis, left shoulder: Secondary | ICD-10-CM

## 2019-06-01 DIAGNOSIS — Z9181 History of falling: Secondary | ICD-10-CM

## 2019-06-01 DIAGNOSIS — G4700 Insomnia, unspecified: Secondary | ICD-10-CM

## 2019-06-01 DIAGNOSIS — I420 Dilated cardiomyopathy: Secondary | ICD-10-CM

## 2019-06-01 DIAGNOSIS — Z7901 Long term (current) use of anticoagulants: Secondary | ICD-10-CM

## 2019-06-01 DIAGNOSIS — M19011 Primary osteoarthritis, right shoulder: Secondary | ICD-10-CM

## 2019-06-01 DIAGNOSIS — F329 Major depressive disorder, single episode, unspecified: Secondary | ICD-10-CM

## 2019-06-01 DIAGNOSIS — I5042 Chronic combined systolic (congestive) and diastolic (congestive) heart failure: Secondary | ICD-10-CM | POA: Diagnosis not present

## 2019-06-01 DIAGNOSIS — N183 Chronic kidney disease, stage 3 (moderate): Secondary | ICD-10-CM | POA: Diagnosis not present

## 2019-06-01 DIAGNOSIS — S0121XD Laceration without foreign body of nose, subsequent encounter: Secondary | ICD-10-CM

## 2019-06-01 DIAGNOSIS — Z85828 Personal history of other malignant neoplasm of skin: Secondary | ICD-10-CM

## 2019-06-01 DIAGNOSIS — I2510 Atherosclerotic heart disease of native coronary artery without angina pectoris: Secondary | ICD-10-CM

## 2019-06-01 DIAGNOSIS — S51011D Laceration without foreign body of right elbow, subsequent encounter: Secondary | ICD-10-CM

## 2019-06-01 DIAGNOSIS — F419 Anxiety disorder, unspecified: Secondary | ICD-10-CM

## 2019-06-01 DIAGNOSIS — I130 Hypertensive heart and chronic kidney disease with heart failure and stage 1 through stage 4 chronic kidney disease, or unspecified chronic kidney disease: Secondary | ICD-10-CM | POA: Diagnosis not present

## 2019-06-01 DIAGNOSIS — R32 Unspecified urinary incontinence: Secondary | ICD-10-CM

## 2019-06-01 DIAGNOSIS — Z8701 Personal history of pneumonia (recurrent): Secondary | ICD-10-CM

## 2019-06-21 ENCOUNTER — Ambulatory Visit (INDEPENDENT_AMBULATORY_CARE_PROVIDER_SITE_OTHER): Payer: Medicare Other | Admitting: Internal Medicine

## 2019-06-21 ENCOUNTER — Encounter: Payer: Self-pay | Admitting: Internal Medicine

## 2019-06-21 DIAGNOSIS — J811 Chronic pulmonary edema: Secondary | ICD-10-CM | POA: Diagnosis not present

## 2019-06-21 DIAGNOSIS — I5022 Chronic systolic (congestive) heart failure: Secondary | ICD-10-CM | POA: Diagnosis not present

## 2019-06-21 DIAGNOSIS — R059 Cough, unspecified: Secondary | ICD-10-CM | POA: Insufficient documentation

## 2019-06-21 DIAGNOSIS — R05 Cough: Secondary | ICD-10-CM

## 2019-06-21 DIAGNOSIS — R627 Adult failure to thrive: Secondary | ICD-10-CM | POA: Diagnosis not present

## 2019-06-21 NOTE — Assessment & Plan Note (Signed)
Demadex po No signs of a problem on the phone

## 2019-06-21 NOTE — Assessment & Plan Note (Signed)
SW is trying to arrange for Home Health via [place]

## 2019-06-21 NOTE — Assessment & Plan Note (Signed)
Recurrent. No SOB, fever Cough syr prn

## 2019-06-21 NOTE — Progress Notes (Signed)
Virtual Visit via Telephone Note  I connected with [name] on 06/21/19 at  2:20 PM EDT by telephone and verified that I am speaking with the correct person using two identifiers.   I discussed the limitations, risks, security and privacy concerns of performing an evaluation and management service by telephone and the availability of in person appointments. I also discussed with the patient that there may be a patient responsible charge related to this service. The patient expressed understanding and agreed to proceed.    History of Present Illness:   C/o cough x 2 -3 d - it happens sometimes. No fever, SOB F/u A fib SW is [facility] to [facility] Observations/Objective:  Pt sounds in NAD Assessment and Plan: See Plan  Follow Up Instructions:    I discussed the assessment and treatment plan with the patient. The patient was provided an opportunity to ask questions and all were answered. The patient agreed with the plan and demonstrated an understanding of the instructions.   The patient was advised to call back or seek an in-person evaluation if the symptoms worsen or if the condition fails to improve as anticipated.  I provided 11 minutes of non-face-to-face time during this encounter.   Walker Kehr, MD

## 2019-07-01 ENCOUNTER — Other Ambulatory Visit: Payer: Self-pay | Admitting: Internal Medicine

## 2019-07-01 NOTE — Telephone Encounter (Signed)
This is too early and can wait for PCP return. Not due until 07/10/19

## 2019-07-01 NOTE — Telephone Encounter (Signed)
[place] Controlled Database Checked Last filled: 06/10/19 # 30 LOV w/PCP: 06/21/19 Next appt w/PCP: None

## 2019-07-02 ENCOUNTER — Telehealth: Payer: Self-pay | Admitting: Internal Medicine

## 2019-07-02 NOTE — Telephone Encounter (Signed)
Recv/d 4 pages of medical records from [facility] forwarded to Dr. Ulice Dash Pyrtle 8/14/20fbg

## 2019-07-03 ENCOUNTER — Other Ambulatory Visit: Payer: Self-pay | Admitting: Internal Medicine

## 2019-07-03 DIAGNOSIS — K5900 Constipation, unspecified: Secondary | ICD-10-CM | POA: Diagnosis not present

## 2019-07-05 ENCOUNTER — Other Ambulatory Visit: Payer: Self-pay | Admitting: Internal Medicine

## 2019-07-05 ENCOUNTER — Telehealth: Payer: Self-pay | Admitting: Internal Medicine

## 2019-07-05 NOTE — Telephone Encounter (Signed)
Control database checked last refill:06/10/2019 30 tabs LOV: 05/10/2019 MOL:[facility]

## 2019-07-05 NOTE — Telephone Encounter (Signed)
[name] [name] states pt had an urgent visit over the weekend and had a KUB done that was positive for an Ileus. Pt was having issues with constipation, bloating, distention and discomfort. Requesting appt. Pt scheduled to see Nicoletta Ba PA 07/07/19@1 :30pm. Veronica having KUB faxed to [phone]. Veronica notified pt of appt.

## 2019-07-07 ENCOUNTER — Encounter: Payer: Self-pay | Admitting: Physician Assistant

## 2019-07-07 ENCOUNTER — Ambulatory Visit (INDEPENDENT_AMBULATORY_CARE_PROVIDER_SITE_OTHER): Payer: Medicare Other | Admitting: Physician Assistant

## 2019-07-07 VITALS — BP 116/70 | HR 76 | Temp 98.1°F | Ht 65.0 in | Wt 162.5 lb

## 2019-07-07 DIAGNOSIS — Z8719 Personal history of other diseases of the digestive system: Secondary | ICD-10-CM | POA: Diagnosis not present

## 2019-07-07 DIAGNOSIS — K5909 Other constipation: Secondary | ICD-10-CM

## 2019-07-07 NOTE — Patient Instructions (Signed)
If you are age 83 or older, your body mass index should be between 23-30. Your Body mass index is 27.04 kg/m. If this is out of the aforementioned range listed, please consider follow up with your Primary Care Provider.  If you are age 85 or younger, your body mass index should be between 19-25. Your Body mass index is 27.04 kg/m. If this is out of the aformentioned range listed, please consider follow up with your Primary Care Provider.   Continue Miralax 17 grams in 8 ounces of water daily.  Continue Amitiza 24 mcg twice daily.  INCREASE water intake by two glasses daily.  Follow up as needed.  Thank you for choosing me and [facility].   Amy Esterwood, PA-C
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
## 2019-07-08 NOTE — Progress Notes (Signed)
Addendum: Reviewed and agree with assessment and management plan. Edward Trevino M, MD  

## 2019-07-12 ENCOUNTER — Telehealth: Payer: Self-pay | Admitting: Internal Medicine

## 2019-07-12 DIAGNOSIS — Y92009 Unspecified place in unspecified non-institutional (private) residence as the place of occurrence of the external cause: Secondary | ICD-10-CM

## 2019-07-12 DIAGNOSIS — W19XXXS Unspecified fall, sequela: Secondary | ICD-10-CM

## 2019-07-12 DIAGNOSIS — R627 Adult failure to thrive: Secondary | ICD-10-CM

## 2019-07-12 NOTE — Telephone Encounter (Signed)
Veronica from [facility] called stating she wanted to speak with nurse about verbal orders for the patient to have home health. She would like a call back please

## 2019-07-12 NOTE — Telephone Encounter (Signed)
I called [name] [name]- she states we need to contact [facility] for [facility] for medication management, ADLs and companion assistance.

## 2019-07-12 NOTE — Telephone Encounter (Signed)
Ok Thx 

## 2019-07-12 NOTE — Telephone Encounter (Signed)
Can you please enter the referral I pended? Thx!

## 2019-07-13 NOTE — Telephone Encounter (Signed)
Done. Thx.

## 2019-07-14 DIAGNOSIS — I110 Hypertensive heart disease with heart failure: Secondary | ICD-10-CM | POA: Diagnosis not present

## 2019-07-14 DIAGNOSIS — E559 Vitamin D deficiency, unspecified: Secondary | ICD-10-CM | POA: Diagnosis not present

## 2019-07-14 DIAGNOSIS — M5134 Other intervertebral disc degeneration, thoracic region: Secondary | ICD-10-CM | POA: Diagnosis not present

## 2019-07-14 DIAGNOSIS — R296 Repeated falls: Secondary | ICD-10-CM | POA: Diagnosis not present

## 2019-07-14 DIAGNOSIS — F5101 Primary insomnia: Secondary | ICD-10-CM | POA: Diagnosis not present

## 2019-07-14 DIAGNOSIS — H5461 Unqualified visual loss, right eye, normal vision left eye: Secondary | ICD-10-CM | POA: Diagnosis not present

## 2019-07-14 DIAGNOSIS — I2510 Atherosclerotic heart disease of native coronary artery without angina pectoris: Secondary | ICD-10-CM | POA: Diagnosis not present

## 2019-07-14 DIAGNOSIS — M50323 Other cervical disc degeneration at C6-C7 level: Secondary | ICD-10-CM | POA: Diagnosis not present

## 2019-07-14 DIAGNOSIS — I700 Atherosclerosis of aorta: Secondary | ICD-10-CM | POA: Diagnosis not present

## 2019-07-14 DIAGNOSIS — R627 Adult failure to thrive: Secondary | ICD-10-CM | POA: Diagnosis not present

## 2019-07-14 DIAGNOSIS — I4891 Unspecified atrial fibrillation: Secondary | ICD-10-CM | POA: Diagnosis not present

## 2019-07-14 DIAGNOSIS — R202 Paresthesia of skin: Secondary | ICD-10-CM | POA: Diagnosis not present

## 2019-07-14 DIAGNOSIS — M4322 Fusion of spine, cervical region: Secondary | ICD-10-CM | POA: Diagnosis not present

## 2019-07-14 DIAGNOSIS — M50322 Other cervical disc degeneration at C5-C6 level: Secondary | ICD-10-CM | POA: Diagnosis not present

## 2019-07-14 DIAGNOSIS — I5042 Chronic combined systolic (congestive) and diastolic (congestive) heart failure: Secondary | ICD-10-CM | POA: Diagnosis not present

## 2019-07-14 DIAGNOSIS — F419 Anxiety disorder, unspecified: Secondary | ICD-10-CM | POA: Diagnosis not present

## 2019-07-14 NOTE — Telephone Encounter (Signed)
Also need verbal orders to ad   Social Wokers services

## 2019-07-15 NOTE — Telephone Encounter (Signed)
Verbal orders given for below.  

## 2019-07-16 ENCOUNTER — Telehealth: Payer: Self-pay | Admitting: Internal Medicine

## 2019-07-16 NOTE — Telephone Encounter (Signed)
Home Health Verbal Orders - Caller/Agency: [facility]  Callback Number: [id number] secure Requesting OT/PT/Skilled Nursing/Social Work/Speech Therapy: OT Frequency: 1wk 2, 0wk 1, 1wk 1 ADL,  IADL, exercise, and heart failure education.

## 2019-07-16 NOTE — Telephone Encounter (Signed)
Verbal orders given for below.  

## 2019-07-28 ENCOUNTER — Telehealth: Payer: Self-pay

## 2019-07-28 NOTE — Telephone Encounter (Signed)
Patient called in wanting advice on what to eat to help with IBS-constipation he has been diagnosed with a while back---he has been and continues to take miralax, senokot, linzess and amitiza as GI doctor has recommended----I have suggested that patient also concentrate on eating whole grain foods, fresh or frozen fruits and vegetables and beans, increase water intake, limit caffeine and dairy---patient will try this and call back if still no help, can talk with tamara,RN at [facility] if needed

## 2019-07-29 ENCOUNTER — Telehealth: Payer: Self-pay | Admitting: Internal Medicine

## 2019-07-29 NOTE — Telephone Encounter (Signed)
FYI

## 2019-07-29 NOTE — Telephone Encounter (Signed)
Langley Gauss rn [facility] is calling and the patient is refusing nurse service this week  due to death of sister.

## 2019-07-30 ENCOUNTER — Telehealth: Payer: Self-pay | Admitting: Internal Medicine

## 2019-07-30 NOTE — Telephone Encounter (Signed)
Verbal given, Juluis Rainier

## 2019-07-30 NOTE — Telephone Encounter (Signed)
Copied from [place] [id number]. Topic: Quick Communication - See Telephone Encounter >> Jul 29, 2019  5:01 PM Loma Boston wrote: CRM for notification. See Telephone encounter for: 07/29/19. Verbal order cb Pt missed a visit this wk due to a death in the family.... ok to add a visit making 2 visit nxt week call back to Tommi Emery at [phone]

## 2019-08-02 ENCOUNTER — Other Ambulatory Visit: Payer: Self-pay | Admitting: Cardiology

## 2019-08-02 ENCOUNTER — Telehealth: Payer: Self-pay | Admitting: Cardiology

## 2019-08-02 NOTE — Telephone Encounter (Signed)
Spoke with Langley Gauss with [facility].She was calling to report she went out today to see patient.Stated weight stable.No sob.Swelling in lower legs no worse.Stated patient has slight to trace diminished lung sounds bilateral.Advised I will send message to Dr.Hochrein.

## 2019-08-02 NOTE — Telephone Encounter (Signed)
Sounds like he is doing OK.

## 2019-08-02 NOTE — Telephone Encounter (Signed)
New message   Per Langley Gauss states lungs are slighlty diminished at the bases. Please call Langley Gauss to discuss.

## 2019-08-03 NOTE — Telephone Encounter (Signed)
[name] [name], she will follow up for another visit and call back if any changes

## 2019-08-05 ENCOUNTER — Telehealth: Payer: Self-pay | Admitting: Internal Medicine

## 2019-08-05 NOTE — Telephone Encounter (Signed)
Left message for pt to call back.  Pt wanted to know how to take magnesium citrate that he has taken in the past. Discussed with pt that he is taking the amitiza and miralax. Reminded him to increase his water in take. Pt instructed to take half the bottle of mg citrate and wait an hour to see if he has BM. If no BM in an hour repeat the rest of the bottle. Pt aware.

## 2019-08-05 NOTE — Telephone Encounter (Signed)
Pt returned your call.  

## 2019-08-11 ENCOUNTER — Inpatient Hospital Stay (HOSPITAL_COMMUNITY)
Admission: EM | Admit: 2019-08-11 | Discharge: 2019-08-16 | DRG: 291 | Disposition: A | Discharge status: Home Health | Payer: Medicare Other | Attending: Internal Medicine | Admitting: Internal Medicine

## 2019-08-11 ENCOUNTER — Ambulatory Visit: Payer: Self-pay | Admitting: Internal Medicine

## 2019-08-11 ENCOUNTER — Emergency Department (HOSPITAL_COMMUNITY): Payer: Medicare Other

## 2019-08-11 ENCOUNTER — Other Ambulatory Visit: Payer: Self-pay

## 2019-08-11 ENCOUNTER — Encounter (HOSPITAL_COMMUNITY): Payer: Self-pay

## 2019-08-11 DIAGNOSIS — I10 Essential (primary) hypertension: Secondary | ICD-10-CM | POA: Diagnosis present

## 2019-08-11 DIAGNOSIS — J9601 Acute respiratory failure with hypoxia: Secondary | ICD-10-CM | POA: Diagnosis not present

## 2019-08-11 DIAGNOSIS — I482 Chronic atrial fibrillation, unspecified: Secondary | ICD-10-CM

## 2019-08-11 DIAGNOSIS — Z20822 Contact with and (suspected) exposure to covid-19: Secondary | ICD-10-CM

## 2019-08-11 DIAGNOSIS — R0902 Hypoxemia: Secondary | ICD-10-CM | POA: Diagnosis not present

## 2019-08-11 DIAGNOSIS — Z66 Do not resuscitate: Secondary | ICD-10-CM | POA: Diagnosis not present

## 2019-08-11 DIAGNOSIS — R54 Age-related physical debility: Secondary | ICD-10-CM | POA: Diagnosis present

## 2019-08-11 DIAGNOSIS — I5021 Acute systolic (congestive) heart failure: Secondary | ICD-10-CM | POA: Diagnosis not present

## 2019-08-11 DIAGNOSIS — N183 Chronic kidney disease, stage 3 unspecified: Secondary | ICD-10-CM | POA: Diagnosis present

## 2019-08-11 DIAGNOSIS — F419 Anxiety disorder, unspecified: Secondary | ICD-10-CM | POA: Diagnosis not present

## 2019-08-11 DIAGNOSIS — K5900 Constipation, unspecified: Secondary | ICD-10-CM | POA: Diagnosis present

## 2019-08-11 DIAGNOSIS — E785 Hyperlipidemia, unspecified: Secondary | ICD-10-CM | POA: Diagnosis not present

## 2019-08-11 DIAGNOSIS — J90 Pleural effusion, not elsewhere classified: Secondary | ICD-10-CM

## 2019-08-11 DIAGNOSIS — R0602 Shortness of breath: Secondary | ICD-10-CM | POA: Diagnosis not present

## 2019-08-11 DIAGNOSIS — I4819 Other persistent atrial fibrillation: Secondary | ICD-10-CM | POA: Diagnosis present

## 2019-08-11 DIAGNOSIS — Z9889 Other specified postprocedural states: Secondary | ICD-10-CM

## 2019-08-11 DIAGNOSIS — Z79899 Other long term (current) drug therapy: Secondary | ICD-10-CM

## 2019-08-11 DIAGNOSIS — J9811 Atelectasis: Secondary | ICD-10-CM | POA: Diagnosis not present

## 2019-08-11 DIAGNOSIS — R05 Cough: Secondary | ICD-10-CM | POA: Diagnosis not present

## 2019-08-11 DIAGNOSIS — D696 Thrombocytopenia, unspecified: Secondary | ICD-10-CM | POA: Diagnosis present

## 2019-08-11 DIAGNOSIS — I517 Cardiomegaly: Secondary | ICD-10-CM | POA: Diagnosis not present

## 2019-08-11 DIAGNOSIS — Z20828 Contact with and (suspected) exposure to other viral communicable diseases: Secondary | ICD-10-CM | POA: Diagnosis present

## 2019-08-11 DIAGNOSIS — I130 Hypertensive heart and chronic kidney disease with heart failure and stage 1 through stage 4 chronic kidney disease, or unspecified chronic kidney disease: Principal | ICD-10-CM | POA: Diagnosis present

## 2019-08-11 DIAGNOSIS — I429 Cardiomyopathy, unspecified: Secondary | ICD-10-CM | POA: Diagnosis not present

## 2019-08-11 DIAGNOSIS — Z7901 Long term (current) use of anticoagulants: Secondary | ICD-10-CM

## 2019-08-11 DIAGNOSIS — Z85828 Personal history of other malignant neoplasm of skin: Secondary | ICD-10-CM | POA: Diagnosis not present

## 2019-08-11 DIAGNOSIS — I509 Heart failure, unspecified: Secondary | ICD-10-CM

## 2019-08-11 DIAGNOSIS — Z8249 Family history of ischemic heart disease and other diseases of the circulatory system: Secondary | ICD-10-CM | POA: Diagnosis not present

## 2019-08-11 DIAGNOSIS — R296 Repeated falls: Secondary | ICD-10-CM | POA: Diagnosis present

## 2019-08-11 DIAGNOSIS — I2510 Atherosclerotic heart disease of native coronary artery without angina pectoris: Secondary | ICD-10-CM | POA: Diagnosis present

## 2019-08-11 DIAGNOSIS — N400 Enlarged prostate without lower urinary tract symptoms: Secondary | ICD-10-CM | POA: Diagnosis present

## 2019-08-11 DIAGNOSIS — I5023 Acute on chronic systolic (congestive) heart failure: Secondary | ICD-10-CM | POA: Diagnosis not present

## 2019-08-11 DIAGNOSIS — Z888 Allergy status to other drugs, medicaments and biological substances status: Secondary | ICD-10-CM | POA: Diagnosis not present

## 2019-08-11 DIAGNOSIS — K5909 Other constipation: Secondary | ICD-10-CM | POA: Diagnosis not present

## 2019-08-11 DIAGNOSIS — Z86010 Personal history of colonic polyps: Secondary | ICD-10-CM | POA: Diagnosis not present

## 2019-08-11 DIAGNOSIS — I5043 Acute on chronic combined systolic (congestive) and diastolic (congestive) heart failure: Secondary | ICD-10-CM | POA: Diagnosis present

## 2019-08-11 LAB — CBC WITH DIFFERENTIAL/PLATELET
Abs Immature Granulocytes: 0.01 10*3/uL (ref 0.00–0.07)
Basophils Absolute: 0 10*3/uL (ref 0.0–0.1)
Basophils Relative: 0 %
Eosinophils Absolute: 0.1 10*3/uL (ref 0.0–0.5)
Eosinophils Relative: 2 %
HCT: 49.3 % (ref 39.0–52.0)
Hemoglobin: 15.6 g/dL (ref 13.0–17.0)
Immature Granulocytes: 0 %
Lymphocytes Relative: 17 %
Lymphs Abs: 0.8 10*3/uL (ref 0.7–4.0)
MCH: 31.2 pg (ref 26.0–34.0)
MCHC: 31.6 g/dL (ref 30.0–36.0)
MCV: 98.6 fL (ref 80.0–100.0)
Monocytes Absolute: 0.5 10*3/uL (ref 0.1–1.0)
Monocytes Relative: 11 %
Neutro Abs: 3.3 10*3/uL (ref 1.7–7.7)
Neutrophils Relative %: 70 %
Platelets: 129 10*3/uL — ABNORMAL LOW (ref 150–400)
RBC: 5 MIL/uL (ref 4.22–5.81)
RDW: 16.4 % — ABNORMAL HIGH (ref 11.5–15.5)
WBC: 4.7 10*3/uL (ref 4.0–10.5)
nRBC: 0.4 % — ABNORMAL HIGH (ref 0.0–0.2)

## 2019-08-11 LAB — BASIC METABOLIC PANEL
Anion gap: 9 (ref 5–15)
BUN: 39 mg/dL — ABNORMAL HIGH (ref 8–23)
CO2: 30 mmol/L (ref 22–32)
Calcium: 8.5 mg/dL — ABNORMAL LOW (ref 8.9–10.3)
Chloride: 103 mmol/L (ref 98–111)
Creatinine, Ser: 1.54 mg/dL — ABNORMAL HIGH (ref 0.61–1.24)
GFR calc Af Amer: 43 mL/min — ABNORMAL LOW (ref >60)
GFR calc non Af Amer: 37 mL/min — ABNORMAL LOW (ref >60)
Glucose, Bld: 124 mg/dL — ABNORMAL HIGH (ref 70–99)
Potassium: 4.4 mmol/L (ref 3.5–5.1)
Sodium: 142 mmol/L (ref 135–145)

## 2019-08-11 LAB — TROPONIN I (HIGH SENSITIVITY)
Troponin I (High Sensitivity): 16 ng/L (ref <18)
Troponin I (High Sensitivity): 16 ng/L (ref <18)

## 2019-08-11 LAB — SARS CORONAVIRUS 2 BY RT PCR (HOSPITAL ORDER, PERFORMED IN ~~LOC~~ HOSPITAL LAB): SARS Coronavirus 2: NEGATIVE

## 2019-08-11 LAB — BRAIN NATRIURETIC PEPTIDE: B Natriuretic Peptide: 401.6 pg/mL — ABNORMAL HIGH (ref 0.0–100.0)

## 2019-08-11 MED ORDER — SODIUM CHLORIDE 0.9% FLUSH
3.0000 mL | Freq: Two times a day (BID) | INTRAVENOUS | Status: DC
  Administered 2019-08-11 – 2019-08-15 (×8): 3 mL via INTRAVENOUS

## 2019-08-11 MED ORDER — FUROSEMIDE 10 MG/ML IJ SOLN
40.0000 mg | Freq: Two times a day (BID) | INTRAMUSCULAR | Status: DC
  Administered 2019-08-12 (×2): 40 mg via INTRAVENOUS
  Filled 2019-08-11 (×2): qty 4

## 2019-08-11 MED ORDER — AMIODARONE HCL 200 MG PO TABS
200.0000 mg | ORAL_TABLET | Freq: Every day | ORAL | Status: DC
  Administered 2019-08-12 – 2019-08-16 (×5): 200 mg via ORAL
  Filled 2019-08-11 (×5): qty 1

## 2019-08-11 MED ORDER — ACETAMINOPHEN 325 MG PO TABS: 650.0000 mg | ORAL_TABLET | ORAL | Status: DC | PRN

## 2019-08-11 MED ORDER — SENNOSIDES-DOCUSATE SODIUM 8.6-50 MG PO TABS
1.0000 | ORAL_TABLET | Freq: Two times a day (BID) | ORAL | Status: DC
  Administered 2019-08-11 – 2019-08-16 (×10): 1 via ORAL
  Filled 2019-08-11 (×10): qty 1

## 2019-08-11 MED ORDER — POLYETHYLENE GLYCOL 3350 17 G PO PACK
17.0000 g | PACK | Freq: Every day | ORAL | Status: DC
  Administered 2019-08-11 – 2019-08-15 (×5): 17 g via ORAL
  Filled 2019-08-11 (×5): qty 1

## 2019-08-11 MED ORDER — FINASTERIDE 5 MG PO TABS
5.0000 mg | ORAL_TABLET | Freq: Every day | ORAL | Status: DC
  Administered 2019-08-11 – 2019-08-15 (×5): 5 mg via ORAL
  Filled 2019-08-11 (×5): qty 1

## 2019-08-11 MED ORDER — POLYETHYLENE GLYCOL 3350 17 GM/SCOOP PO POWD: 17.0000 g | Freq: Every day | ORAL | Status: DC

## 2019-08-11 MED ORDER — ALBUTEROL SULFATE (2.5 MG/3ML) 0.083% IN NEBU: 3.0000 mL | INHALATION_SOLUTION | Freq: Four times a day (QID) | RESPIRATORY_TRACT | Status: DC | PRN

## 2019-08-11 MED ORDER — DILTIAZEM HCL 30 MG PO TABS
30.0000 mg | ORAL_TABLET | Freq: Two times a day (BID) | ORAL | Status: DC
  Administered 2019-08-11 – 2019-08-12 (×2): 30 mg via ORAL
  Filled 2019-08-11 (×2): qty 1

## 2019-08-11 MED ORDER — MIRTAZAPINE 15 MG PO TABS
7.5000 mg | ORAL_TABLET | Freq: Every day | ORAL | Status: DC
  Administered 2019-08-11 – 2019-08-15 (×5): 7.5 mg via ORAL
  Filled 2019-08-11 (×5): qty 1

## 2019-08-11 MED ORDER — SODIUM CHLORIDE 0.9 % IV SOLN: 250.0000 mL | INTRAVENOUS | Status: DC | PRN

## 2019-08-11 MED ORDER — APIXABAN 2.5 MG PO TABS
2.5000 mg | ORAL_TABLET | Freq: Two times a day (BID) | ORAL | Status: DC
  Administered 2019-08-11 – 2019-08-16 (×10): 2.5 mg via ORAL
  Filled 2019-08-11 (×10): qty 1

## 2019-08-11 MED ORDER — ONDANSETRON HCL 4 MG/2ML IJ SOLN: 4.0000 mg | Freq: Four times a day (QID) | INTRAMUSCULAR | Status: DC | PRN

## 2019-08-11 MED ORDER — PRAVASTATIN SODIUM 20 MG PO TABS
20.0000 mg | ORAL_TABLET | Freq: Every day | ORAL | Status: DC
  Administered 2019-08-12 – 2019-08-16 (×5): 20 mg via ORAL
  Filled 2019-08-11 (×5): qty 1

## 2019-08-11 MED ORDER — SODIUM CHLORIDE 0.9% FLUSH: 3.0000 mL | INTRAVENOUS | Status: DC | PRN

## 2019-08-11 MED ORDER — ALPRAZOLAM 0.5 MG PO TABS
1.0000 mg | ORAL_TABLET | Freq: Every day | ORAL | Status: DC
  Administered 2019-08-11 – 2019-08-15 (×5): 1 mg via ORAL
  Filled 2019-08-11 (×5): qty 2

## 2019-08-11 MED ORDER — FUROSEMIDE 10 MG/ML IJ SOLN
40.0000 mg | Freq: Once | INTRAMUSCULAR | Status: AC
  Administered 2019-08-11: 14:00:00 40 mg via INTRAVENOUS
  Filled 2019-08-11: qty 4

## 2019-08-11 NOTE — Telephone Encounter (Signed)
Pt at ED.

## 2019-08-11 NOTE — H&P (Signed)
[facility] History and Physical   Patient: [name] [name] [id number]   PCP: Cassandria Anger, MD DOB: 07/11/1920   DOA: 08/11/2019   DOS: 08/11/2019   DOS: the patient was seen and examined on 08/11/2019  Patient coming from: The patient is coming from Home  Chief Complaint: Shortness of breath  HPI: [name] [name] is a 85 y.o. male with Past medical history of chronic combined CHF, A. fib, CKD stage III, on chronic Eliquis. Patient presented with complaints of 2 days of cough while sleeping.  He denies having any complaints of chest pain or chest tightness.  No palpitation.  No nausea no vomiting no fever no chills.  He remains compliant with all his medications at home. For last 2 days he started having complaints of cough and he was sleeping and it will wake him up from sleep. It was progressively worsening and then he was seen by home health RN today on 08/11/2019 who referred him to emergency room. Patient the time of my evaluation denies any dizziness or lightheadedness no focal deficit no recent change in his medications.  ED Course: Oxygenation drops to 85% on room air.  Chest x-ray shows bilateral congestion.  Patient was referred for admission for CHF exacerbation.  At his baseline ambulates with assistance Fairly independent for most of his ADL;  Does manages his medication on his own.  Review of Systems: as mentioned in the history of present illness.  All other systems reviewed and are negative.  Past Medical History:  Diagnosis Date  . Anxiety   . Arthritis    "shoulders" (05/28/2016)  . Cardiomyopathy   . Coronary artery disease   . Depression    hx (05/28/2016)  . Hemorrhoids   . Hyperlipidemia   . Hypertension   . Paroxysmal atrial fibrillation (HCC)   . Skipped heart beats    Past Surgical History:  Procedure Laterality Date  . APPENDECTOMY    . CARDIOVERSION N/A 06/04/2016   Procedure: CARDIOVERSION;  Surgeon: Pixie Casino, MD;  Location: [facility]  ENDOSCOPY;  Service: Cardiovascular;  Laterality: N/A;  . CARDIOVERSION N/A 04/09/2017   Procedure: CARDIOVERSION;  Surgeon: Thayer Headings, MD;  Location: [facility] ENDOSCOPY;  Service: Cardiovascular;  Laterality: N/A;  . CARDIOVERSION N/A 04/01/2018   Procedure: CARDIOVERSION;  Surgeon: Skeet Latch, MD;  Location: [place];  Service: Cardiovascular;  Laterality: N/A;  . CATARACT EXTRACTION Left   . CHOLECYSTECTOMY OPEN    . COLONOSCOPY W/ BIOPSIES AND POLYPECTOMY    . FLEXIBLE SIGMOIDOSCOPY N/A 01/01/2019   Procedure: FLEXIBLE SIGMOIDOSCOPY;  Surgeon: Jerene Bears, MD;  Location: [facility] ENDOSCOPY;  Service: Gastroenterology;  Laterality: N/A;  . INGUINAL HERNIA REPAIR Bilateral   . MYRINGOTOMY WITH TUBE PLACEMENT Bilateral   . skin cancer removal Right    side of nose by Rt eye  . TEE WITHOUT CARDIOVERSION N/A 06/04/2016   Procedure: TRANSESOPHAGEAL ECHOCARDIOGRAM (TEE);  Surgeon: Pixie Casino, MD;  Location: [facility] ENDOSCOPY;  Service: Cardiovascular;  Laterality: N/A;  . TEE WITHOUT CARDIOVERSION N/A 04/01/2018   Procedure: TRANSESOPHAGEAL ECHOCARDIOGRAM (TEE);  Surgeon: Skeet Latch, MD;  Location: [place];  Service: Cardiovascular;  Laterality: N/A;  . TONSILLECTOMY AND ADENOIDECTOMY     Social History:  reports that he has never smoked. He has never used smokeless tobacco. He reports that he does not drink alcohol or use drugs.  Allergies  Allergen Reactions  . Lisinopril Cough    Family history reviewed and not pertinent Family History  Problem Relation  Age of Onset  . Heart disease Brother      Prior to Admission medications   Medication Sig Start Date End Date Taking? Authorizing Provider  ALPRAZolam (XANAX) 1 MG tablet TAKE ONE TABLET AT BEDTIME. Patient taking differently: Take 1 mg by mouth at bedtime.  07/08/19  Yes Plotnikov, Evie Lacks, MD  amiodarone (PACERONE) 200 MG tablet TAKE 1 TABLET ONCE DAILY. Patient taking differently: Take 200 mg by mouth daily.   05/04/19  Yes Plotnikov, Evie Lacks, MD  diltiazem (CARDIZEM) 30 MG tablet TAKE 1 TABLET BY MOUTH TWICE DAILY. Patient taking differently: Take 30 mg by mouth 2 (two) times daily.  08/02/19  Yes Hochrein, Jeneen Rinks, MD  ELIQUIS 2.5 MG TABS tablet TAKE 1 TABLET BY MOUTH TWICE DAILY. Patient taking differently: Take 2.5 mg by mouth 2 (two) times daily.  05/04/19  Yes Plotnikov, Evie Lacks, MD  finasteride (PROSCAR) 5 MG tablet Take 5 mg by mouth at bedtime.   Yes [provider]  mirtazapine (REMERON) 7.5 MG tablet Take 7.5 mg by mouth at bedtime. 06/21/19  Yes [provider]  Multiple Vitamins-Minerals (ICAPS PO) Take 1 capsule by mouth daily.   Yes [provider]  polyethylene glycol powder (MIRALAX) powder 1 capful BID Patient taking differently: Take 17 g by mouth at bedtime.  12/15/18  Yes Plotnikov, Evie Lacks, MD  pravastatin (PRAVACHOL) 20 MG tablet TAKE 1 TABLET EACH DAY. Patient taking differently: Take 20 mg by mouth daily.  05/04/19  Yes Plotnikov, Evie Lacks, MD  senna (SENOKOT) 8.6 MG tablet Take 1 tablet by mouth 2 (two) times daily.   Yes [provider]  torsemide (DEMADEX) 10 MG tablet Take 1 tablet (10 mg total) by mouth daily. 09/30/18  Yes Plotnikov, Evie Lacks, MD  VENTOLIN HFA 108 (90 Base) MCG/ACT inhaler Inhale 2 puffs into the lungs every 6 (six) hours as needed for wheezing or shortness of breath.  03/19/18  Yes [provider]  lubiprostone (AMITIZA) 24 MCG capsule TAKE (1) CAPSULE TWICE DAILY. Patient not taking: Reported on 08/11/2019 03/31/19   Jerene Bears, MD  triamcinolone ointment (KENALOG) 0.1 % Apply 1 application topically 2 (two) times daily. To irritated skin Patient not taking: Reported on 08/11/2019 12/03/18   Plotnikov, Evie Lacks, MD    Physical Exam: Vitals:   08/11/19 1158 08/11/19 1800 08/11/19 1815 08/11/19 2017  BP: 115/78  128/63 107/83  Pulse:  (!) 28  65  Resp:  (!) 22 20   Temp:    97.8 F (36.6 C)  TempSrc:     Oral  SpO2:  95%  97%  Weight:      Height:        General: alert and oriented to time, place, and person. Appear in mild distress, affect appropriate Eyes: Right prior trauma, left pupil reactive to light, Conjunctiva normal ENT: Oral Mucosa Clear, moist  Neck: Mild JVD, no Abnormal Mass Or lumps Cardiovascular: S1 and S2 Present, no Murmur, peripheral pulses symmetrical Respiratory: good respiratory effort, Bilateral Air entry equal and Decreased, no signs of accessory muscle use, bilateral crackles, no wheezes Abdomen: Bowel Sound prdsent, Soft and no tenderness, no hernia Skin: no rashes  Extremities: bilateral  Pedal edema, no calf tenderness Neurologic: without any new focal findings Gait not checked due to patient safety concerns  Data Reviewed: I have personally reviewed and interpreted labs, imaging as discussed below.  CBC: Recent Labs  Lab 08/11/19 1333  WBC 4.7  NEUTROABS 3.3  HGB  15.6  HCT 49.3  MCV 98.6  PLT [id number]*   Basic Metabolic Panel: Recent Labs  Lab 08/11/19 1333  NA 142  K 4.4  CL 103  CO2 30  GLUCOSE 124*  BUN 39*  CREATININE 1.54*  CALCIUM 8.5*   GFR: Estimated Creatinine Clearance: 26.5 mL/min (A) (by C-G formula based on SCr of 1.54 mg/dL (H)). Liver Function Tests: No results for input(s): AST, ALT, ALKPHOS, BILITOT, PROT, ALBUMIN in the last 168 hours. No results for input(s): LIPASE, AMYLASE in the last 168 hours. No results for input(s): AMMONIA in the last 168 hours. Coagulation Profile: No results for input(s): INR, PROTIME in the last 168 hours. Cardiac Enzymes: No results for input(s): CKTOTAL, CKMB, CKMBINDEX, TROPONINI in the last 168 hours. BNP (last 3 results) No results for input(s): PROBNP in the last 8760 hours. HbA1C: No results for input(s): HGBA1C in the last 72 hours. CBG: No results for input(s): GLUCAP in the last 168 hours. Lipid Profile: No results for input(s): CHOL, HDL, LDLCALC, TRIG, CHOLHDL, LDLDIRECT in  the last 72 hours. Thyroid Function Tests: No results for input(s): TSH, T4TOTAL, FREET4, T3FREE, THYROIDAB in the last 72 hours. Anemia Panel: No results for input(s): VITAMINB12, FOLATE, FERRITIN, TIBC, IRON, RETICCTPCT in the last 72 hours. Urine analysis:    Component Value Date/Time   COLORURINE AMBER (A) 10/14/2018 1112   APPEARANCEUR CLOUDY (A) 10/14/2018 1112   LABSPEC 1.013 10/14/2018 1112   PHURINE 6.0 10/14/2018 1112   GLUCOSEU NEGATIVE 10/14/2018 1112   GLUCOSEU NEGATIVE 10/23/2017 1245   HGBUR MODERATE (A) 10/14/2018 1112   BILIRUBINUR NEGATIVE 10/14/2018 1112   KETONESUR NEGATIVE 10/14/2018 1112   PROTEINUR 100 (A) 10/14/2018 1112   UROBILINOGEN 0.2 10/23/2017 1245   NITRITE NEGATIVE 10/14/2018 1112   LEUKOCYTESUR LARGE (A) 10/14/2018 1112    Radiological Exams on Admission: Dg Chest Port 1 View  Result Date: 08/11/2019 CLINICAL DATA:  Shortness of breath EXAM: PORTABLE CHEST 1 VIEW COMPARISON:  Mar 30, 2018 FINDINGS: The cardiac silhouette remains enlarged. There are aortic calcifications. There are moderate-sized bilateral pleural effusions with adjacent compressive atelectasis. There is no pneumothorax. There are end-stage degenerative changes of the left glenohumeral joint. IMPRESSION: 1. Cardiomegaly with findings consistent with congestive heart failure. 2. Moderate bilateral pleural effusions. 3. Compressive atelectasis at the lung bases bilaterally. Electronically Signed   By: Constance Holster M.D.   On: 08/11/2019 13:17   EKG: Independently reviewed. atrial fibrillation, rate controlled, RBBB. Echocardiogram: May 19, EF 20% diffuse hypokinesis  I reviewed all nursing notes, pharmacy notes, vitals, pertinent old records.  Assessment/Plan 1.  Acute on chronic combined systolic and diastolic CHF Acute hypoxic respiratory failure. Presents with orthopnea and PND. Currently 85% on room air requiring 2 LPM nasal cannula. Chest x-ray shows moderate bilateral  pleural effusion as well as compressive atelectasis bilaterally. Also evidence of bilateral congestive CHF. Serial troponins are unremarkable. EKG unremarkable. BNP 401 elevated. On torsemide at home and compliant. Patient was given IV Lasix with improvement in symptoms. At present we will continue with IV Lasix. Also provide flutter valve and incentive spirometry. Monitor on telemetry.  2.  Chronic kidney disease stage III Renal function currently at baseline. Monitor while patient is receiving diuresis.  3.  Chronic A. fib Currently rate controlled. CHA2DS2-VASc Score *5 Continue anticoagulation. Patient is on Cardizem and follows up with cardiology although given his low EF should be on other medication. Patient is also on amiodarone but currently in actually A. fib unsure whether  patient will benefit from continuation of the medication. TEE in March 2019 for possible cardioversion but patient was not in A. fib and therefore cardioversion was not performed. We will defer to cardiology for modification of the medical regimen.  4.  Chronic thrombocytopenia. Platelet count chronically low in the 120s. Monitor for active bleeding while the patient is on chronic anticoagulation.  5.  BPH. Continue home regimen.  6.  Chronic constipation. Occasional obstipation. Occasional diarrhea. We will continue home regimen of stool softeners for now.  7.  Hyperlipidemia. Continue Pravachol.  Nutrition: Cardiac diet DVT Prophylaxis: Therapeutic Anticoagulation with Eliquis  Advance goals of care discussion: DNR   Consults: Cardiology  Family Communication: no family was present at bedside, at the time of interview.  Disposition: Admitted as inpatient, telemetry unit. Likely to be discharged home, in 2-3 days.  I have discussed plan of care as described above with RN and patient/family.  Severity of Illness: The appropriate patient status for this patient is INPATIENT. Inpatient  status is judged to be reasonable and necessary in order to provide the required intensity of service to ensure the patient's safety. The patient's presenting symptoms, physical exam findings, and initial radiographic and laboratory data in the context of their chronic comorbidities is felt to place them at high risk for further clinical deterioration. Furthermore, it is not anticipated that the patient will be medically stable for discharge from the hospital within 2 midnights of admission. The following factors support the patient status of inpatient.   " The patient's presenting symptoms include shortness of breath and orthopnea and PND. " The worrisome physical exam findings include acute hypoxia, leg edema, bilateral crackles. " The initial radiographic and laboratory data are worrisome because of moderate pleural effusion bilaterally. " The chronic co-morbidities include A. fib.   * I certify that at the point of admission it is my clinical judgment that the patient will require inpatient hospital care spanning beyond 2 midnights from the point of admission due to high intensity of service, high risk for further deterioration and high frequency of surveillance required.*    Author: Berle Mull, MD [facility] 08/11/2019 8:20 PM   To reach On-call, see care teams to locate the attending and reach out to them via [email or web address]. If 7PM-7AM, please contact night-coverage If you still have difficulty reaching the attending provider, please page the [facility] (Director on Call) for [facility] on amion for assistance.
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
## 2019-08-11 NOTE — ED Notes (Signed)
Pt given clear liquid dinner tray at this time.

## 2019-08-11 NOTE — Telephone Encounter (Signed)
'  Langley Gauss' RN from [facility] calling to report pt with increased SOB. States pt reported "Awake all night in chair, could not lie down due to SOB." States breath sounds diminished in bases. Reports O2 sat 91% on RA. "Usually at 95%."   Also reports 2+ edema BLEs. States wt is down one pound but no record of previous wt for 1 week. Also reports constant cough, non-productive but pt "Sounds wet, congested."  RN states "Big change in him since seen last week."  Advised ED. Assured [place] would alert Dr. Alain Marion. Nurse verbalizes understanding.   Reason for Disposition . [1] MODERATE difficulty breathing (e.g., speaks in phrases, SOB even at rest, pulse 100-120) AND [2] NEW-onset or WORSE than normal  Answer Assessment - Initial Assessment Questions 1. RESPIRATORY STATUS: "Describe your breathing?" (e.g., wheezing, shortness of breath, unable to speak, severe coughing)     SOB at rest, cannot lie flat 2. ONSET: "When did this breathing problem begin?"      unsure 3. PATTERN "Does the difficult breathing come and go, or has it been constant since it started?"    Constant 4. SEVERITY: "How bad is your breathing?" (e.g., mild, moderate, severe)    - MILD: No SOB at rest, mild SOB with walking, speaks normally in sentences, can lay down, no retractions, pulse < 100.    - MODERATE: SOB at rest, SOB with minimal exertion and prefers to sit, cannot lie down flat, speaks in phrases, mild retractions, audible wheezing, pulse 100-120.    - SEVERE: Very SOB at rest, speaks in single words, struggling to breathe, sitting hunched forward, retractions, pulse > 120      Moderate 5. RECURRENT SYMPTOM: "Have you had difficulty breathing before?" If so, ask: "When was the last time?" and "What happened that time?"      *No Answer* 6. CARDIAC HISTORY: "Do you have any history of heart disease?" (e.g., heart attack, angina, bypass surgery, angioplasty)      7. LUNG HISTORY: "Do you have any history of lung disease?"  (e.g.,  pulmonary embolus, asthma, emphysema)     8.SE: "What do you think is causing the breathing problem?"      9. OTHER SYMPTOMS: "Do you have any other symptoms? (e.g., dizziness, runny nose, cough, chest pain, fever)     2+edema BLEs, decreased breath sounds  Protocols used: BREATHING DIFFICULTY-A-AH

## 2019-08-11 NOTE — ED Notes (Addendum)
Ambulated patient in hallway, desat to 85%. Pt tolerated ambulation well, a bit unsteady and is 1 assist. Dr was notified, put patient on 2L of O2.

## 2019-08-11 NOTE — ED Triage Notes (Signed)
Per EMS, patient has had productive cough for one week. Per home health , patient is ordered a cough medicine, but does not take it. Home health called to have patient transported to facility.

## 2019-08-12 DIAGNOSIS — I4819 Other persistent atrial fibrillation: Secondary | ICD-10-CM

## 2019-08-12 DIAGNOSIS — E785 Hyperlipidemia, unspecified: Secondary | ICD-10-CM

## 2019-08-12 DIAGNOSIS — I10 Essential (primary) hypertension: Secondary | ICD-10-CM

## 2019-08-12 DIAGNOSIS — I5023 Acute on chronic systolic (congestive) heart failure: Secondary | ICD-10-CM

## 2019-08-12 LAB — BASIC METABOLIC PANEL
Anion gap: 9 (ref 5–15)
BUN: 34 mg/dL — ABNORMAL HIGH (ref 8–23)
CO2: 30 mmol/L (ref 22–32)
Calcium: 8.4 mg/dL — ABNORMAL LOW (ref 8.9–10.3)
Chloride: 101 mmol/L (ref 98–111)
Creatinine, Ser: 1.37 mg/dL — ABNORMAL HIGH (ref 0.61–1.24)
GFR calc Af Amer: 50 mL/min — ABNORMAL LOW (ref >60)
GFR calc non Af Amer: 43 mL/min — ABNORMAL LOW (ref >60)
Glucose, Bld: 89 mg/dL (ref 70–99)
Potassium: 4.3 mmol/L (ref 3.5–5.1)
Sodium: 140 mmol/L (ref 135–145)

## 2019-08-12 MED ORDER — METOPROLOL TARTRATE 25 MG PO TABS
12.5000 mg | ORAL_TABLET | Freq: Two times a day (BID) | ORAL | Status: DC
  Administered 2019-08-12 – 2019-08-16 (×8): 12.5 mg via ORAL
  Filled 2019-08-12 (×8): qty 1

## 2019-08-12 NOTE — Consult Note (Addendum)
Cardiology Consultation:   Patient ID: [name] [name] MRN: [id number]; DOB: 08-Jul-1920  Admit date: 08/11/2019 Date of Consult: 08/12/2019  Primary Care Provider: Cassandria Anger, MD Primary Cardiologist: Minus Breeding, MD Primary Electrophysiologist:  None    Patient Profile:   [name] [name] is a 85 y.o. male with a history of questionable CAD, acute on chronic systolic CHF with EF of 0000000 on Echo in 03/2018, persistent atrial fibrillation s/p multiple DCCV last in 03/2018 maintained on Amiodarone and Eliquis, hypertension, hyperlipidemia, CKD stage III, depression, and anxiety who is being seen today for the evaluation of CHF at the request of Dr. Posey Pronto (Internal Medicine).  History of Present Illness:   [name] [name] is a 85 year old male with the above history who is followed by Dr. Percival Spanish. Patient has history of CAD listed in chart but I do not see in previous cardiac catheterization. An office visit note from 2017 mentions an abnormal stress test years ago that was treated medically. Patient was seen Dr. Percival Spanish on 04/09/2019 via telehealth visit. Per Dr. Rosezella Florida note at that time, he has persistent atrial fibrillation and severe cardiomyopathy with EF of 20-25% on Echo in 03/2018 and his heart failure has been somewhat difficult to manage.  Patient insist on living independently has home health care who comes to see him regularly.  At that visit he reported increasing shortness of breath despite increase in Torsemide. Home health went to visit patient later that time and reported him being very lethargic with progressive decline. He was felt to have progressive failure to thrive. Patient was advised to go to the ED for further evaluation; however, it does not look like he did this. He did present to the ED several days later on 04/16/2019 following a mechanical fall where he lost his balance while leaning over to pick up the mail. He hit his head during the fall and sustained multiple  lacerations on his face. Lacerations were repaired. CT showed no intracranial bleeding. Patient was advised to hold Eliquis for the next 2-3 days and follow-up to discuss the risks and benefits of resumption of Eliquis.  Patient presented to [facility] ED yesterday for evaluation of shortness of breath and productive cough.  Patient states that his cough has been more pronounced than his shortness of breath.  He reports a cough for the past few days that is intermittently productive with gray phlegm.  He notes some chills the other day but denies any fever or body aches.  No significant nasal congestion.  He also reports some shortness of breath with activity as well as some orthopnea.  He states he sometimes has to sleep in a recliner.  No PND.  He has difficulty elaborating on the shortness of breath and cannot tell me when exactly it started worsening.  No chest pain, palpitations, lightheadedness, dizziness, syncope. He denies any falls since the last time he was in the ED but RN states that she was told he had another fall 3 weeks ago. No abnormal bleeding including hematochezia, melena, hematuria, hemoptysis.  He does note that his stools are not very regular and he is either constipated or had diarrhea.  No abdominal pain, nausea, vomiting.  In the ED, vitals stable. EKG shows atrial fibrillation, rate 101 bpm, with no acute ST/T changes. High-sensitivity troponin negative x2. BNP elevated at 401.6. Chest x-ray showed cardiomegaly with moderate sized bilateral pleural effusion with adjacent compressive atelectasis. WBC 4.7, Hgb 15.6, Plts 129. Na 142, K 4.4, Glucose  124, BUN 39, SCr 1.54. COVID-19 testing negative. Patient was admitted for acute on chronic CHF with hypoxic respiratory failure and started on IV Lasix.  At the time of this evaluation, patient breathing comfortably on 2 L nasal cannula. He is resting on a slight incline with no labored breathing. He states his breathing has improved  some with the IV Lasix.  Heart Pathway Score:     Past Medical History:  Diagnosis Date   Anxiety    Arthritis    "shoulders" (05/28/2016)   Cardiomyopathy    Coronary artery disease    Depression    hx (05/28/2016)   Hemorrhoids    Hyperlipidemia    Hypertension    Paroxysmal atrial fibrillation ([place])    Skipped heart beats     Past Surgical History:  Procedure Laterality Date   APPENDECTOMY     CARDIOVERSION N/A 06/04/2016   Procedure: CARDIOVERSION;  Surgeon: Pixie Casino, MD;  Location: [place];  Service: Cardiovascular;  Laterality: N/A;   CARDIOVERSION N/A 04/09/2017   Procedure: CARDIOVERSION;  Surgeon: Thayer Headings, MD;  Location: [facility] ENDOSCOPY;  Service: Cardiovascular;  Laterality: N/A;   CARDIOVERSION N/A 04/01/2018   Procedure: CARDIOVERSION;  Surgeon: Skeet Latch, MD;  Location: [place];  Service: Cardiovascular;  Laterality: N/A;   CATARACT EXTRACTION Left    CHOLECYSTECTOMY OPEN     COLONOSCOPY W/ BIOPSIES AND POLYPECTOMY     FLEXIBLE SIGMOIDOSCOPY N/A 01/01/2019   Procedure: FLEXIBLE SIGMOIDOSCOPY;  Surgeon: Jerene Bears, MD;  Location: [facility] ENDOSCOPY;  Service: Gastroenterology;  Laterality: N/A;   INGUINAL HERNIA REPAIR Bilateral    MYRINGOTOMY WITH TUBE PLACEMENT Bilateral    skin cancer removal Right    side of nose by Rt eye   TEE WITHOUT CARDIOVERSION N/A 06/04/2016   Procedure: TRANSESOPHAGEAL ECHOCARDIOGRAM (TEE);  Surgeon: Pixie Casino, MD;  Location: [facility] ENDOSCOPY;  Service: Cardiovascular;  Laterality: N/A;   TEE WITHOUT CARDIOVERSION N/A 04/01/2018   Procedure: TRANSESOPHAGEAL ECHOCARDIOGRAM (TEE);  Surgeon: Skeet Latch, MD;  Location: [facility] ENDOSCOPY;  Service: Cardiovascular;  Laterality: N/A;   TONSILLECTOMY AND ADENOIDECTOMY       Home Medications:  Prior to Admission medications   Medication Sig Start Date End Date Taking? Authorizing Provider  ALPRAZolam (XANAX) 1 MG tablet TAKE ONE TABLET AT  BEDTIME. Patient taking differently: Take 1 mg by mouth at bedtime.  07/08/19  Yes Plotnikov, Evie Lacks, MD  amiodarone (PACERONE) 200 MG tablet TAKE 1 TABLET ONCE DAILY. Patient taking differently: Take 200 mg by mouth daily.  05/04/19  Yes Plotnikov, Evie Lacks, MD  diltiazem (CARDIZEM) 30 MG tablet TAKE 1 TABLET BY MOUTH TWICE DAILY. Patient taking differently: Take 30 mg by mouth 2 (two) times daily.  08/02/19  Yes Hochrein, Jeneen Rinks, MD  ELIQUIS 2.5 MG TABS tablet TAKE 1 TABLET BY MOUTH TWICE DAILY. Patient taking differently: Take 2.5 mg by mouth 2 (two) times daily.  05/04/19  Yes Plotnikov, Evie Lacks, MD  finasteride (PROSCAR) 5 MG tablet Take 5 mg by mouth at bedtime.   Yes [provider]  mirtazapine (REMERON) 7.5 MG tablet Take 7.5 mg by mouth at bedtime. 06/21/19  Yes [provider]  Multiple Vitamins-Minerals (ICAPS PO) Take 1 capsule by mouth daily.   Yes [provider]  polyethylene glycol powder (MIRALAX) powder 1 capful BID Patient taking differently: Take 17 g by mouth at bedtime.  12/15/18  Yes Plotnikov, Evie Lacks, MD  pravastatin (PRAVACHOL) 20 MG tablet TAKE 1 TABLET EACH  DAY. Patient taking differently: Take 20 mg by mouth daily.  05/04/19  Yes Plotnikov, Evie Lacks, MD  senna (SENOKOT) 8.6 MG tablet Take 1 tablet by mouth 2 (two) times daily.   Yes [provider]  torsemide (DEMADEX) 10 MG tablet Take 1 tablet (10 mg total) by mouth daily. 09/30/18  Yes Plotnikov, Evie Lacks, MD  VENTOLIN HFA 108 (90 Base) MCG/ACT inhaler Inhale 2 puffs into the lungs every 6 (six) hours as needed for wheezing or shortness of breath.  03/19/18  Yes [provider]  lubiprostone (AMITIZA) 24 MCG capsule TAKE (1) CAPSULE TWICE DAILY. Patient not taking: Reported on 08/11/2019 03/31/19   Jerene Bears, MD  triamcinolone ointment (KENALOG) 0.1 % Apply 1 application topically 2 (two) times daily. To irritated skin Patient not taking: Reported on 08/11/2019 12/03/18    Plotnikov, Evie Lacks, MD    Inpatient Medications: Scheduled Meds:  ALPRAZolam  1 mg Oral QHS   amiodarone  200 mg Oral Daily   apixaban  2.5 mg Oral BID   diltiazem  30 mg Oral BID   finasteride  5 mg Oral QHS   furosemide  40 mg Intravenous BID   mirtazapine  7.5 mg Oral QHS   polyethylene glycol  17 g Oral QHS   pravastatin  20 mg Oral Daily   senna-docusate  1 tablet Oral BID   sodium chloride flush  3 mL Intravenous Q12H   Continuous Infusions:  sodium chloride     PRN Meds: sodium chloride, acetaminophen, albuterol, ondansetron (ZOFRAN) IV, sodium chloride flush  Allergies:    Allergies  Allergen Reactions   Lisinopril Cough    Social History:   Social History   Socioeconomic History   Marital status: Widowed    Spouse name: Not on file   Number of children: 1   Years of education: Not on file   Highest education level: Not on file  Occupational History   Occupation: Retired  Scientist, product/process development strain: Not on file   Food insecurity    Worry: Not on file    Inability: Not on Lexicographer needs    Medical: Not on file    Non-medical: Not on file  Tobacco Use   Smoking status: Never Smoker   Smokeless tobacco: Never Used  Substance and Sexual Activity   Alcohol use: No   Drug use: No   Sexual activity: Yes  Lifestyle   Physical activity    Days per week: Not on file    Minutes per session: Not on file   Stress: Not on file  Relationships   Social connections    Talks on phone: Not on file    Gets together: Not on file    Attends religious service: Not on file    Active member of club or organization: Not on file    Attends meetings of clubs or organizations: Not on file    Relationship status: Not on file   Intimate partner violence    Fear of current or ex partner: Not on file    Emotionally abused: Not on file    Physically abused: Not on file    Forced sexual activity: Not on file    Other Topics Concern   Not on file  Social History Narrative   Not on file    Family History:    Family History  Problem Relation Age of Onset   Heart disease Brother  ROS:  Please see the history of present illness.  Review of Systems  Constitutional: Positive for chills. Negative for fever.  HENT: Negative for congestion.   Respiratory: Positive for cough, sputum production and shortness of breath. Negative for hemoptysis.   Cardiovascular: Positive for orthopnea. Negative for chest pain, palpitations and PND.  Gastrointestinal: Positive for constipation and diarrhea. Negative for abdominal pain, blood in stool, melena, nausea and vomiting.  Genitourinary: Negative for hematuria.  Musculoskeletal: Negative for myalgias.  Skin:       Bruise under right eye  Neurological: Negative for dizziness and loss of consciousness.  Endo/Heme/Allergies: Bruises/bleeds easily (bruises easily).   All other ROS reviewed and negative.     Physical Exam/Data:   Vitals:   08/11/19 1815 08/11/19 2017 08/11/19 2028 08/12/19 0633  BP: 128/63 107/83  131/85  Pulse:  65  90  Resp: 20   16  Temp:  97.8 F (36.6 C)  98 F (36.7 C)  TempSrc:  Oral    SpO2:  97%  100%  Weight:   70 kg   Height:        Intake/Output Summary (Last 24 hours) at 08/12/2019 0718 Last data filed at 08/12/2019 [id number] Gross per 24 hour  Intake --  Output 600 ml  Net -600 ml   Last 3 Weights 08/11/2019 08/11/2019 07/07/2019  Weight (lbs) 154 lb 5.9 oz 165 lb 162 lb 8 oz  Weight (kg) 70.02 kg 74.844 kg 73.71 kg     Body mass index is 23.47 kg/m.  General: 85 y.o. Caucasian male resting comfortably in no acute distress. HEENT: Normocephalic and atraumatic.  Neck: Supple. No carotid bruits. No JVD. Heart: Irregularly irregular rhythm with normal rate. Distinct S1 and S2. No murmurs, gallops, or rubs. Lungs: No increased work of breathing. Diminished breath sounds in bilateral bases. No significant wheezes,  rhonchi, or rales appreciated. Abdomen: Soft, non-distended, and non-tender to palpation. Bowel sounds present. MSK: Generalized weakness. Extremities: Trace to 1+ pitting edema of bilateral lower extremities. Skin: Warm and dry. Multiple bruises across arms and healing bruise under right eye (from fall several months ago). Neuro: Alert and oriented x3. No focal deficits. Psych: Normal affect. Responds appropriately.  EKG:  The EKG was personally reviewed and demonstrates:  Atrial fibrillation, rate 101, with RBBB and LAFB but no acute ST/T changes compared to prior tracings. Telemetry:  Telemetry was personally reviewed and demonstrates:  Atrial fibrillation with rates in the 80's to 90's and PVCs.  Relevant CV Studies: Echocardiogram 04/01/2019: Study Conclusions: - Left ventricle: The cavity size was mildly dilated. There was   mild focal basal and mild concentric hypertrophy of the septum.   Systolic function was severely reduced. The estimated ejection   fraction was in the range of 20% to 25%. Diffuse hypokinesis. - Aortic valve: There was mild regurgitation. Valve area (VTI):   2.17 cm^2. Valve area (Vmax): 2.28 cm^2. Valve area (Vmean): 2.11   cm^2. - Mitral valve: There was moderate regurgitation. - Left atrium: The atrium was severely dilated. - Right ventricle: The cavity size was severely dilated. Wall   thickness was normal. Systolic function was moderately reduced. - Right atrium: The atrium was moderately dilated. - Pulmonary arteries: Systolic pressure was mildly increased. PA   peak pressure: 37 mm Hg (S). - Pericardium, extracardiac: A trivial pericardial effusion was   identified posterior to the heart. Features were not consistent   with tamponade physiology. There was a left pleural effusion.  Impressions: - When  compared to the prior study from 04/07/2017 LVEF has further   decreased now 20-25% with diffuse hypokinesis. RVEF is moderately   decreased,  previously normal.  Laboratory Data:  High Sensitivity Troponin:   Recent Labs  Lab 08/11/19 1333 08/11/19 1732  TROPONINIHS 16 16     Chemistry Recent Labs  Lab 08/11/19 1333 08/12/19 0532  NA 142 140  K 4.4 4.3  CL 103 101  CO2 30 30  GLUCOSE 124* 89  BUN 39* 34*  CREATININE 1.54* 1.37*  CALCIUM 8.5* 8.4*  GFRNONAA 37* 43*  GFRAA 43* 50*  ANIONGAP 9 9    No results for input(s): PROT, ALBUMIN, AST, ALT, ALKPHOS, BILITOT in the last 168 hours. Hematology Recent Labs  Lab 08/11/19 1333  WBC 4.7  RBC 5.00  HGB 15.6  HCT 49.3  MCV 98.6  MCH 31.2  MCHC 31.6  RDW 16.4*  PLT 129*   BNP Recent Labs  Lab 08/11/19 1333  BNP 401.6*    DDimer No results for input(s): DDIMER in the last 168 hours.   Radiology/Studies:  Dg Chest Port 1 View  Result Date: 08/11/2019 CLINICAL DATA:  Shortness of breath EXAM: PORTABLE CHEST 1 VIEW COMPARISON:  Mar 30, 2018 FINDINGS: The cardiac silhouette remains enlarged. There are aortic calcifications. There are moderate-sized bilateral pleural effusions with adjacent compressive atelectasis. There is no pneumothorax. There are end-stage degenerative changes of the left glenohumeral joint. IMPRESSION: 1. Cardiomegaly with findings consistent with congestive heart failure. 2. Moderate bilateral pleural effusions. 3. Compressive atelectasis at the lung bases bilaterally. Electronically Signed   By: Constance Holster M.D.   On: 08/11/2019 13:17    Assessment and Plan:   Acute on Chronic Systolic CHF - Patient presented with shortness of breath and productive cough x1 week.  - Chest x-ray showed cardiomegaly with moderate sized bilateral pleural effusion with adjacent compressive atelectasis. - BNP elevated at 401.6.  - Most recent Echo from 03/2018 showed LVEF of 20-25% with diffuse hypokinesis. - Patient started on IV Lasix 40mg  twice daily with improvement in shortness of breath. Urinary response 600 mL overnight (unsure if this is  accurate). - Continue current dose of IV Lasix. - Patient not on ACEi/ARB/ARNI or Spironolactone at home. Possible due to CKD.  - Patient not on beta-blocker at home due to history of bradycardia. However, he is on Cardizem 30mg  twice daily and appears to have been on this for a while now. We normally avoid calcium channel blocker in those with reduced EF due to its negative inotropic effects. Will discuss with MD. - Continue to monitor daily weights, strict I/O's, and renal function. - Per Dr. Rosezella Florida last office visit note in 03/2019, he notes that patient's CHF has been difficult to manage. He also felt like patient was at "the end of life with his advanced age and his ejection fraction of 20%." He has not wanted nursing home placement or discussions with palliative care in the past.  Chronic Atrial Fibrillation - Patient s/p multiple DCCV with the last one being in 03/2018. - Telemetry shows rate controlled atrial fibrillation. - Continue home Amiodarone 200mg  daily and Cardizem 30mg  twice daily. See concerns about Cardizem with reduced EF above. - Continue chronic anticoagulation with Eliquis 2.5mg  twice daily (reduced dose due to age and CKD with serum creatinine often > 1.5). CHA2DS2-VASc = 4-5  (CHF, HTN, age x2, and questionable CAD). However, patient has had multiple falls and is very frail. I had brief discussion with patient  about stroke risk vs bleeding risk but we may need to discuss this more going forward.  Questionable CAD - Patient has history of CAD listed in chart. However, I do not see any evidence of previous ischemic work-up. There is an office visit note from 2017 that mentions previous abnormal stress test several years ago where the decision was made to treat medically. However, patient denies ever having stress test or cardiac cath.  Hypertension - BP well controlled at 131/85. - Continue current medications.  Hyperlipidemia - Continue home Pravastatin.  CKD Stage  III - Serum creatinine 1.54 on admission. Baseline around 1.4 to 1.6. - Repeat 1.37 this morning after diuresis. - Continue daily BMET.  For questions or updates, please contact [place] Please consult [email or web address] for contact info under     Signed, Darreld Mclean, PA-C  08/12/2019 7:18 AM   Attending Note:   The patient was seen and examined.  Agree with assessment and plan as noted above.  Changes made to the above note as needed.  Patient seen and independently examined with Sande Rives, PA .   We discussed all aspects of the encounter. I agree with the assessment and plan as stated above.  1.    Acute on Chronic systolic CHF : Ejection fraction is 20 to 25%.  This is been unchanged for many years.  He presents now with an acute on chronic decompensation of his CHF.  He denies eating any extra salt but he does not fix his own meals.  He has sitters and neighbors that bring him meals.  I have cautioned him about trying to limit his salt intake.  He is done well after IV Lasix.  He takes torsemide at home.  We will give instructions for him to take an extra torsemide if he gains weight at home.  He has a home health nurse that can help him with this assessment.  He has been on diltiazem for years.  I think that he would do better on low-dose metoprolol.  We will make that change today and follow.  He has prepackaged medications from [facility].  He will need to have his home health nurse pulled out the diltiazem tablets and substitute the metoprolol tablets.   2.  Persistent atrial fibrillation: He is on low-dose Eliquis.   He is on amiodarone 200 mg a day presumably to help with rate control.  He has  had longstanding atrial fibrillation and is had many cardioversions but the strategy now appears to be rate control and anticoagulation.      He had a fall in May but has not fallen since then.  I will leave it up to Dr. Percival Spanish as to whether or not to continue  long-term medications.  He is quite frail but has not fallen in 4 to 5 months.  3.  Hypertension: Blood pressure seems to be well controlled.   4.  Hyperlipidemia: Continue Pravachol.   I have spent a total of 40 minutes with patient reviewing hospital  notes , telemetry, EKGs, labs and examining patient as well as establishing an assessment and plan that was discussed with the patient. > 50% of time was spent in direct patient care.   Thayer Headings, Brooke Bonito., MD, [facility] 08/12/2019, 11:15 AM [address],  [place] Pager [phone]

## 2019-08-12 NOTE — Evaluation (Addendum)
Occupational Therapy Evaluation Patient Details Name: [name] [name] MRN: [id number] DOB: 04/17/1920 Today's Date: 08/12/2019    History of Present Illness Pt is a 85 y/o male with PMHx including chronic combined CHF, A.fib, CKD stage III, HTN, presents with complaints of 2 days of cough while sleeping. Pt admitted for CHF exacerbation.    Clinical Impression   This 85 y/o male presents with the above. PTA pt reports mod independence with functional mobility using RW, reports intermittent assist for ADL including bathing. Pt presenting with decreased standing balance, weakness, and decreased activity tolerance. Pt sleepy this AM but agreeable to working with therapies. He currently requires minA for functional transfers using RW and for short distance mobility. Currently requires modA for LB ADL, setup-minguard assist for seated UB ADL.   Trialled pt on RA with activity, O2 sats maintaining at 95% while seated, difficult to obtain accurate waveform when moving but noted lowest O2 sat 84%, when using 2L lowest noted 89%, returning to >92% with seated rest.   Pt will benefit from continued acute OT services, recommend he have 24hr supervision/assis time of discharge. Pt may be good candidate for [facility] program with [facility]. If pt unable to have 24hr assist at home, may need to consider SNF. Will follow.     Follow Up Recommendations  Home health OT;Supervision/Assistance - 24 hour(may be good candidate for [facility] with [facility])    Equipment Recommendations  Tub/shower seat           Precautions / Restrictions Precautions Precautions: Fall Precaution Comments: pt reports x2 falls in approx past 2 months Restrictions Weight Bearing Restrictions: No      Mobility Bed Mobility Overal bed mobility: Needs Assistance Bed Mobility: Supine to Sit;Sit to Supine     Supine to sit: Mod assist;HOB elevated Sit to supine: Mod assist   General bed mobility comments: assist for trunk  elevation and to scoot hips; assist for LEs onto EOB when returning to supine  Transfers Overall transfer level: Needs assistance Equipment used: Rolling walker (2 wheeled) Transfers: Sit to/from Stand Sit to Stand: Min assist         General transfer comment: boosting and steadying assist at RW    Balance Overall balance assessment: Needs assistance;History of Falls Sitting-balance support: Feet supported Sitting balance-Leahy Scale: Good     Standing balance support: Bilateral upper extremity supported Standing balance-Leahy Scale: Poor Standing balance comment: reliant on UE support                           ADL either performed or assessed with clinical judgement   ADL Overall ADL's : Needs assistance/impaired Eating/Feeding: Modified independent;Sitting   Grooming: Set up;Sitting;Wash/dry face Grooming Details (indicate cue type and reason): had to encourage pt to perform on his own as he initially requested therapist assist to complete Upper Body Bathing: Min guard;Sitting   Lower Body Bathing: Moderate assistance;Sit to/from stand   Upper Body Dressing : Min guard;Set up;Sitting   Lower Body Dressing: Moderate assistance;Sit to/from stand Lower Body Dressing Details (indicate cue type and reason): minA-minguard standing balance Toilet Transfer: Minimal assistance;Ambulation;RW Toilet Transfer Details (indicate cue type and reason): simulated via transfer to/from EOB Toileting- Clothing Manipulation and Hygiene: Minimal assistance;Sit to/from stand       Functional mobility during ADLs: Minimal assistance;Rolling walker General ADL Comments: pt with decreased activity tolerance, weakness     Vision         Perception  Praxis      Pertinent Vitals/Pain Pain Assessment: No/denies pain     Hand Dominance     Extremity/Trunk Assessment Upper Extremity Assessment Upper Extremity Assessment: Generalized weakness   Lower Extremity  Assessment Lower Extremity Assessment: Defer to PT evaluation       Communication Communication Communication: HOH   Cognition Arousal/Alertness: Lethargic(sleepy this AM) Behavior During Therapy: WFL for tasks assessed/performed Overall Cognitive Status: Within Functional Limits for tasks assessed                                     General Comments       Exercises     Shoulder Instructions      Home Living Family/patient expects to be discharged to:: Private residence Living Arrangements: Alone Available Help at Discharge: Family;Friend(s);Personal care attendant;Available PRN/intermittently Type of Home: House Home Access: Stairs to enter [employer] of Steps: 3 Entrance Stairs-Rails: Right;Left Home Layout: One level     Bathroom Shower/Tub: Walk-in shower;Tub/shower unit   Bathroom Toilet: Standard     Home Equipment: Cane - single point;Walker - 2 wheels;Bedside commode   Additional Comments: reports he is looking into getting shower chair      Prior Functioning/Environment Level of Independence: Independent with assistive device(s);Needs assistance  Gait / Transfers Assistance Needed: most recently has been using RW for increased safety as pt reports x2 falls in the past 2 months ADL's / Homemaking Assistance Needed: intermittent assist for bathing ADL, pt reports has recently been sponge bathing            OT Problem List: Decreased strength;Decreased range of motion;Decreased activity tolerance;Impaired balance (sitting and/or standing);Decreased safety awareness;Decreased knowledge of use of DME or AE;Cardiopulmonary status limiting activity      OT Treatment/Interventions: Self-care/ADL training;Therapeutic exercise;Energy conservation;DME and/or AE instruction;Therapeutic activities;Patient/family education;Balance training    OT Goals(Current goals can be found in the care plan section) Acute Rehab OT Goals Patient  Stated Goal: home soon OT Goal Formulation: With patient Time For Goal Achievement: 08/26/19 Potential to Achieve Goals: Good  OT Frequency: Min 2X/week   Barriers to D/C:            Co-evaluation              AM-PAC OT "6 Clicks" Daily Activity     Outcome Measure Help from another person eating meals?: None Help from another person taking care of personal grooming?: A Little Help from another person toileting, which includes using toliet, bedpan, or urinal?: A Little Help from another person bathing (including washing, rinsing, drying)?: A Lot Help from another person to put on and taking off regular upper body clothing?: None Help from another person to put on and taking off regular lower body clothing?: A Lot 6 Click Score: 18   End of Session Equipment Utilized During Treatment: Rolling walker;Oxygen Nurse Communication: Mobility status  Activity Tolerance: Patient tolerated treatment well;Patient limited by fatigue Patient left: in bed;with call bell/phone within reach;with bed alarm set  OT Visit Diagnosis: Muscle weakness (generalized) (M62.81);Unsteadiness on feet (R26.81);History of falling (Z91.81)                Time: [id number] OT Time Calculation (min): 36 min Charges:  OT General Charges $OT Visit: 1 Visit OT Evaluation $OT Eval Moderate Complexity: 1 Mod OT Treatments $Self Care/Home Management : 8-22 mins  Lou Cal, OT Supplemental Rehabilitation Services Pager [phone] Office [phone]  Raymondo Band 08/12/2019, 10:32 AM

## 2019-08-12 NOTE — Progress Notes (Signed)
[facility]                                                                              Patient Demographics  [name] [name], is a 85 y.o. male, DOB - 04-Jan-1920, [id number]  Admit date - 08/11/2019   Admitting Physician Lavina Hamman, MD  Outpatient Primary MD for the patient is Plotnikov, Evie Lacks, MD  Outpatient specialists:   LOS - 1  days   Medical records reviewed and are as summarized below:    Chief Complaint  Patient presents with  . Cough    x1 week (productive)        Brief summary   [name] [name] is a 85 y.o. male with Past medical history of chronic combined CHF, A. fib, CKD stage III, on chronic Eliquis. Patient presented with complaints of 2 days of cough while sleeping.  He denies having any complaints of chest pain or chest tightness.  No palpitation.  No nausea no vomiting no fever no chills.  He remains compliant with all his medications at home. For last 2 days he started having complaints of cough and he was sleeping and it will wake him up from sleep. It was progressively worsening and then he was seen by home health RN today on 08/11/2019 who referred him to emergency room. At baseline, ambulates with assistance, in ED O2 sats dropped to 85% on room air, chest x-ray showed bilateral congestion.  Assessment & Plan    Principal Problem:   Acute on chronic combined systolic and diastolic CHF, acute hypoxic respiratory failure  -Presented with orthopnea, PND, shortness of breath.  In ED 85% on room air requiring 2 L O2 via nasal cannula -Hest x-ray showed moderate bilateral pleural effusions, compressive atelectasis bilaterally -Serial troponins unremarkable, EKG unremarkable, BNP elevated 401, had improvement in symptoms with Lasix given in ED -Cardiology consulted.  2D echo 03/2018 had shown EF of 20 to 25% with diffuse hypokinesis -Cardiology recommended continue current dose of IV Lasix.  Patient is not on ACE/ARB, spironolactone at  home possibly due to CKD.  Not on beta-blocker at home due to history of bradycardia.  Patient however has been taking Cardizem 30 mg twice daily. -Patient follows Dr. Percival Spanish, per last note 04/07/2019 patient was felt to be at end-of-life with his advanced age, EF of 20%.  Patient had not wanted nursing home placement or discussions with palliative care in the past. -On torsemide at home  Active Problems: Persistent atrial fibrillation -Status post multiple DCCV, last in 03/2018, still in A. Fib -Currently rate controlled.  Patient is on amiodarone and Cardizem at home. -Continue low-dose Eliquis -Cardiology recommended low-dose beta-blocker for rate control, avoid Cardizem with reduced EF.    Dyslipidemia -Continue Pravachol    Essential hypertension -BP currently stable  BPH Continue home regimen, finasteride  Chronic constipation Continue home regimen of stool softeners  Code Status: DNR DVT Prophylaxis: Apixaban Family Communication: Discussed all imaging results, lab results, explained to the patient    Disposition Plan: Continue IV Lasix today, DC home when cleared by cardiology  Time Spent in minutes 35 minutes  Procedures:  None  Consultants:   Cardiology  Antimicrobials:   Anti-infectives (From admission, onward)   None          Medications  Scheduled Meds: . ALPRAZolam  1 mg Oral QHS  . amiodarone  200 mg Oral Daily  . apixaban  2.5 mg Oral BID  . finasteride  5 mg Oral QHS  . furosemide  40 mg Intravenous BID  . metoprolol tartrate  12.5 mg Oral BID  . mirtazapine  7.5 mg Oral QHS  . polyethylene glycol  17 g Oral QHS  . pravastatin  20 mg Oral Daily  . senna-docusate  1 tablet Oral BID  . sodium chloride flush  3 mL Intravenous Q12H   Continuous Infusions: . sodium chloride     PRN Meds:.sodium chloride, acetaminophen, albuterol, ondansetron (ZOFRAN) IV, sodium chloride flush      Subjective:   [name] [name] was seen and examined  today.  Sitting up in the bed, denies any chest pain.  Feels the shortness of breath is a little better after Lasix.  No fevers or chills. Patient denies dizziness, abdominal pain, N/V/D/C, new weakness, numbess, tingling. No acute events overnight.    Objective:   Vitals:   08/12/19 0633 08/12/19 1038 08/12/19 1309 08/12/19 1406  BP: 131/85 104/83 119/86   Pulse: 90 96 88   Resp: 16  17   Temp: 98 F (36.7 C)  (!) 95 F (35 C) (!) 97.3 F (36.3 C)  TempSrc:   Axillary Oral  SpO2: 100%  98%   Weight:      Height:        Intake/Output Summary (Last 24 hours) at 08/12/2019 1436 Last data filed at 08/12/2019 1400 Gross per 24 hour  Intake 480 ml  Output 1200 ml  Net -720 ml     Wt Readings from Last 3 Encounters:  08/11/19 70 kg  07/07/19 73.7 kg  05/17/19 75.3 kg     Exam  General: Alert and oriented x 3, NAD  Eyes:   HEENT:  Atraumatic, normocephalic,   Cardiovascular: S1 S2 auscultated, no murmurs, RRR  Respiratory: Diminished breath sounds at the bases  Gastrointestinal: Soft, nontender, nondistended, + bowel sounds  Ext: no pedal edema bilaterally  Neuro: Trace to 1+ pitting edema of bilateral lower extremities  Musculoskeletal: No digital cyanosis, clubbing  Skin: No rashes  Psych: Normal affect and demeanor, alert and oriented x3    Data Reviewed:  I have personally reviewed following labs and imaging studies  Micro Results Recent Results (from the past 240 hour(s))  SARS Coronavirus 2 [facility] order, Performed in [place] hospital lab) Nasopharyngeal Nasopharyngeal Swab     Status: None   Collection Time: 08/11/19  1:33 PM   Specimen: Nasopharyngeal Swab  Result Value Ref Range Status   SARS Coronavirus 2 NEGATIVE NEGATIVE Final    Comment: (NOTE) If result is NEGATIVE SARS-CoV-2 target nucleic acids are NOT DETECTED. The SARS-CoV-2 RNA is generally detectable in upper and lower  respiratory specimens during the acute phase of infection.  The lowest  concentration of SARS-CoV-2 viral copies this assay can detect is 250  copies / mL. A negative result does not preclude SARS-CoV-2 infection  and should not be used as the sole basis for treatment or other  patient management decisions.  A negative result may occur with  improper specimen collection / handling, submission of specimen other  than nasopharyngeal swab, presence of viral mutation(s) within the  areas targeted by this assay, and inadequate  number of viral copies  (<250 copies / mL). A negative result must be combined with clinical  observations, patient history, and epidemiological information. If result is POSITIVE SARS-CoV-2 target nucleic acids are DETECTED. The SARS-CoV-2 RNA is generally detectable in upper and lower  respiratory specimens dur ing the acute phase of infection.  Positive  results are indicative of active infection with SARS-CoV-2.  Clinical  correlation with patient history and other diagnostic information is  necessary to determine patient infection status.  Positive results do  not rule out bacterial infection or co-infection with other viruses. If result is PRESUMPTIVE POSTIVE SARS-CoV-2 nucleic acids MAY BE PRESENT.   A presumptive positive result was obtained on the submitted specimen  and confirmed on repeat testing.  While 2019 novel coronavirus  (SARS-CoV-2) nucleic acids may be present in the submitted sample  additional confirmatory testing may be necessary for epidemiological  and / or clinical management purposes  to differentiate between  SARS-CoV-2 and other Sarbecovirus currently known to infect humans.  If clinically indicated additional testing with an alternate test  methodology [phone]) is advised. The SARS-CoV-2 RNA is generally  detectable in upper and lower respiratory sp ecimens during the acute  phase of infection. The expected result is Negative. Fact Sheet for Patients:  StrictlyIdeas.no  Fact Sheet for Healthcare Providers: BankingDealers.co.za This test is not yet approved or cleared by the [place] FDA and has been authorized for detection and/or diagnosis of SARS-CoV-2 by FDA under an Emergency Use Authorization (EUA).  This EUA will remain in effect (meaning this test can be used) for the duration of the COVID-19 declaration under Section 564(b)(1) of the Act, 21 U.S.C. section 360bbb-3(b)(1), unless the authorization is terminated or revoked sooner. Performed at [facility], [place] [address], [place], [place] [address]     Radiology Reports Dg Chest Port 1 View  Result Date: 08/11/2019 CLINICAL DATA:  Shortness of breath EXAM: PORTABLE CHEST 1 VIEW COMPARISON:  Mar 30, 2018 FINDINGS: The cardiac silhouette remains enlarged. There are aortic calcifications. There are moderate-sized bilateral pleural effusions with adjacent compressive atelectasis. There is no pneumothorax. There are end-stage degenerative changes of the left glenohumeral joint. IMPRESSION: 1. Cardiomegaly with findings consistent with congestive heart failure. 2. Moderate bilateral pleural effusions. 3. Compressive atelectasis at the lung bases bilaterally. Electronically Signed   By: Constance Holster M.D.   On: 08/11/2019 13:17    Lab Data:  CBC: Recent Labs  Lab 08/11/19 1333  WBC 4.7  NEUTROABS 3.3  HGB 15.6  HCT 49.3  MCV 98.6  PLT [id number]*   Basic Metabolic Panel: Recent Labs  Lab 08/11/19 1333 08/12/19 0532  NA 142 140  K 4.4 4.3  CL 103 101  CO2 30 30  GLUCOSE 124* 89  BUN 39* 34*  CREATININE 1.54* 1.37*  CALCIUM 8.5* 8.4*   GFR: Estimated Creatinine Clearance: 29.8 mL/min (A) (by C-G formula based on SCr of 1.37 mg/dL (H)). Liver Function Tests: No results for input(s): AST, ALT, ALKPHOS, BILITOT, PROT, ALBUMIN in the last 168 hours. No results for input(s): LIPASE, AMYLASE in the last 168 hours. No results for input(s):  AMMONIA in the last 168 hours. Coagulation Profile: No results for input(s): INR, PROTIME in the last 168 hours. Cardiac Enzymes: No results for input(s): CKTOTAL, CKMB, CKMBINDEX, TROPONINI in the last 168 hours. BNP (last 3 results) No results for input(s): PROBNP in the last 8760 hours. HbA1C: No results for input(s): HGBA1C in the last 72 hours.  CBG: No results for input(s): GLUCAP in the last 168 hours. Lipid Profile: No results for input(s): CHOL, HDL, LDLCALC, TRIG, CHOLHDL, LDLDIRECT in the last 72 hours. Thyroid Function Tests: No results for input(s): TSH, T4TOTAL, FREET4, T3FREE, THYROIDAB in the last 72 hours. Anemia Panel: No results for input(s): VITAMINB12, FOLATE, FERRITIN, TIBC, IRON, RETICCTPCT in the last 72 hours. Urine analysis:    Component Value Date/Time   COLORURINE AMBER (A) 10/14/2018 1112   APPEARANCEUR CLOUDY (A) 10/14/2018 1112   LABSPEC 1.013 10/14/2018 1112   PHURINE 6.0 10/14/2018 1112   GLUCOSEU NEGATIVE 10/14/2018 1112   GLUCOSEU NEGATIVE 10/23/2017 1245   HGBUR MODERATE (A) 10/14/2018 1112   BILIRUBINUR NEGATIVE 10/14/2018 1112   KETONESUR NEGATIVE 10/14/2018 1112   PROTEINUR 100 (A) 10/14/2018 1112   UROBILINOGEN 0.2 10/23/2017 1245   NITRITE NEGATIVE 10/14/2018 1112   LEUKOCYTESUR LARGE (A) 10/14/2018 1112     Treyshaun Keatts M.D. [facility] 08/12/2019, 2:36 PM  Pager: [phone] Between 7am to 7pm - call Pager - [phone]  After 7pm go to [email or web address] - password [email or web address]  Call night coverage person covering after 7pm

## 2019-08-12 NOTE — Progress Notes (Signed)
PT Cancellation Note  Patient Details Name: [name] [name] MRN: [id number] DOB: March 12, 1920   Cancelled Treatment:     Pt not agreeable to mobilize at this time.  Requesting to rest despite encouragement.  Will check back as schedule permits.   Havanna Groner,KATHrine E 08/12/2019, 11:23 AM Carmelia Bake, PT, DPT Acute Rehabilitation Services Office: [phone] Pager: [phone]

## 2019-08-12 NOTE — Evaluation (Signed)
Physical Therapy Evaluation Patient Details Name: [name] [name] MRN: [id number] DOB: August 10, 1920 Today's Date: 08/12/2019   History of Present Illness  Pt is a 85 y/o male with PMHx including chronic combined CHF, A.fib, CKD stage III, HTN, presents with complaints of 2 days of cough while sleeping. Pt admitted for CHF exacerbation.   Clinical Impression  Pt admitted with above diagnosis.  Pt currently with functional limitations due to the deficits listed below (see PT Problem List). Pt will benefit from skilled PT to increase their independence and safety with mobility to allow discharge to the venue listed below.  Pt sleeping most of today however agreeable to up to recliner for lunch.  Pt ambulated short distance in room upon standing and then positioned in recliner for lunch.  Pt requiring min assist for mobility at this time so recommend 24/7 assist upon d/c however anticipate pt to progress well.     Follow Up Recommendations Home health PT;Supervision/Assistance - 24 hour    Equipment Recommendations  None recommended by PT    Recommendations for Other Services       Precautions / Restrictions Precautions Precautions: Fall Precaution Comments: pt reports x2 falls in approx past 2 months      Mobility  Bed Mobility Overal bed mobility: Needs Assistance Bed Mobility: Supine to Sit     Supine to sit: Min assist     General bed mobility comments: slight assist for trunk  Transfers Overall transfer level: Needs assistance Equipment used: Rolling walker (2 wheeled) Transfers: Sit to/from Stand Sit to Stand: Min assist         General transfer comment: assist to rise and steady, cues for controlling descent; verbal cues for hand placement  Ambulation/Gait Ambulation/Gait assistance: Min assist Gait Distance (Feet): 12 Feet Assistive device: Rolling walker (2 wheeled) Gait Pattern/deviations: Step-through pattern;Decreased stride length;Trunk flexed     General  Gait Details: verbal cues for RW positioning, pt attempting to ambulate in room so removed nasal cannula briefly and reapplied; pt only wanted to perform short distance (lunch had arrived)  Stairs            Wheelchair Mobility    Modified Rankin (Stroke Patients Only)       Balance Overall balance assessment: Needs assistance;History of Falls         Standing balance support: Bilateral upper extremity supported Standing balance-Leahy Scale: Poor Standing balance comment: reliant on UE support                             Pertinent Vitals/Pain Pain Assessment: No/denies pain    Home Living Family/patient expects to be discharged to:: Private residence Living Arrangements: Alone Available Help at Discharge: Family;Friend(s);Personal care attendant;Available PRN/intermittently Type of Home: House Home Access: Stairs to enter Entrance Stairs-Rails: Psychiatric nurse of Steps: 3 Home Layout: One level Home Equipment: Cane - single point;Walker - 2 wheels;Bedside commode Additional Comments: reports he is looking into getting shower chair    Prior Function Level of Independence: Independent with assistive device(s);Needs assistance   Gait / Transfers Assistance Needed: most recently has been using RW for increased safety as pt reports x2 falls in the past 2 months  ADL's / Homemaking Assistance Needed: intermittent assist for bathing ADL, pt reports has recently been sponge bathing        Hand Dominance        Extremity/Trunk Assessment        Lower Extremity  Assessment Lower Extremity Assessment: Generalized weakness       Communication   Communication: HOH  Cognition Arousal/Alertness: Awake/alert Behavior During Therapy: WFL for tasks assessed/performed Overall Cognitive Status: Within Functional Limits for tasks assessed                                 General Comments: sleeping on arrival however more  alert/aware upon sitting EOB      General Comments      Exercises     Assessment/Plan    PT Assessment Patient needs continued PT services  PT Problem List Decreased strength;Decreased activity tolerance;Decreased balance;Decreased knowledge of use of DME;Decreased mobility;Cardiopulmonary status limiting activity       PT Treatment Interventions DME instruction;Therapeutic exercise;Gait training;Balance training;Functional mobility training;Stair training;Therapeutic activities;Patient/family education    PT Goals (Current goals can be found in the Care Plan section)  Acute Rehab PT Goals PT Goal Formulation: With patient Time For Goal Achievement: 08/19/19 Potential to Achieve Goals: Good    Frequency Min 3X/week   Barriers to discharge        Co-evaluation               AM-PAC PT "6 Clicks" Mobility  Outcome Measure Help needed turning from your back to your side while in a flat bed without using bedrails?: A Little Help needed moving from lying on your back to sitting on the side of a flat bed without using bedrails?: A Little Help needed moving to and from a bed to a chair (including a wheelchair)?: A Little Help needed standing up from a chair using your arms (e.g., wheelchair or bedside chair)?: A Little Help needed to walk in hospital room?: A Little Help needed climbing 3-5 steps with a railing? : A Lot 6 Click Score: 17    End of Session Equipment Utilized During Treatment: Gait belt Activity Tolerance: Patient tolerated treatment well Patient left: in chair;with call bell/phone within reach;with chair alarm set Nurse Communication: Mobility status PT Visit Diagnosis: Other abnormalities of gait and mobility (R26.89);History of falling (Z91.81)    Time: [id number] PT Time Calculation (min) (ACUTE ONLY): 15 min   Charges:   PT Evaluation $PT Eval Low Complexity: [place], PT, DPT Acute Rehabilitation Services Office:  [phone] Pager: [phone]  Trena Platt 08/12/2019, 2:52 PM

## 2019-08-13 ENCOUNTER — Inpatient Hospital Stay (HOSPITAL_COMMUNITY): Payer: Medicare Other

## 2019-08-13 LAB — BASIC METABOLIC PANEL
Anion gap: 9 (ref 5–15)
BUN: 36 mg/dL — ABNORMAL HIGH (ref 8–23)
CO2: 35 mmol/L — ABNORMAL HIGH (ref 22–32)
Calcium: 8.6 mg/dL — ABNORMAL LOW (ref 8.9–10.3)
Chloride: 97 mmol/L — ABNORMAL LOW (ref 98–111)
Creatinine, Ser: 1.82 mg/dL — ABNORMAL HIGH (ref 0.61–1.24)
GFR calc Af Amer: 35 mL/min — ABNORMAL LOW (ref >60)
GFR calc non Af Amer: 30 mL/min — ABNORMAL LOW (ref >60)
Glucose, Bld: 89 mg/dL (ref 70–99)
Potassium: 4.5 mmol/L (ref 3.5–5.1)
Sodium: 141 mmol/L (ref 135–145)

## 2019-08-13 MED ORDER — FUROSEMIDE 10 MG/ML IJ SOLN: 40.0000 mg | Freq: Every day | INTRAMUSCULAR | Status: DC
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
## 2019-08-13 NOTE — Progress Notes (Addendum)
Progress Note  Patient Name: [name] [name] Date of Encounter: 08/13/2019  Primary Cardiologist: Minus Breeding, MD   Subjective   No acute overnight events. Patient states he thinks is breathing is OK today. He does not appeared to be significantly short of breath but he also only able to take very shallow breaths. No chest pain.  Inpatient Medications    Scheduled Meds:  ALPRAZolam  1 mg Oral QHS   amiodarone  200 mg Oral Daily   apixaban  2.5 mg Oral BID   finasteride  5 mg Oral QHS   furosemide  40 mg Intravenous BID   metoprolol tartrate  12.5 mg Oral BID   mirtazapine  7.5 mg Oral QHS   polyethylene glycol  17 g Oral QHS   pravastatin  20 mg Oral Daily   senna-docusate  1 tablet Oral BID   sodium chloride flush  3 mL Intravenous Q12H   Continuous Infusions:  sodium chloride     PRN Meds: sodium chloride, acetaminophen, albuterol, ondansetron (ZOFRAN) IV, sodium chloride flush   Vital Signs    Vitals:   08/12/19 1309 08/12/19 1406 08/12/19 2157 08/13/19 0541  BP: 119/86  100/83 99/80  Pulse: 88  86 81  Resp: 17  16 16   Temp: (!) 95 F (35 C) (!) 97.3 F (36.3 C) 98.4 F (36.9 C) 97.7 F (36.5 C)  TempSrc: Axillary Oral Oral Oral  SpO2: 98%  98% 98%  Weight:    69.4 kg  Height:        Intake/Output Summary (Last 24 hours) at 08/13/2019 1054 Last data filed at 08/13/2019 0544 Gross per 24 hour  Intake 360 ml  Output 2700 ml  Net -2340 ml   Last 3 Weights 08/13/2019 08/11/2019 08/11/2019  Weight (lbs) 153 lb 154 lb 5.9 oz 165 lb  Weight (kg) 69.4 kg 70.02 kg 74.844 kg      Telemetry    Atrial fibrillation with rates in the 80's to 90's with PVCs. - Personally Reviewed  ECG    No new tracing today. - Personally Reviewed  Physical Exam   General: 85 y.o. male resting comfortably in no acute distress. HEENT: Normal. Neck: Supple. Heart: Irregularly irregular rhythm with normal rate. Distinct S1 and S2. No murmurs, gallops, or rubs.  Radial pulses 2+ and equal bilaterally. Lungs: No increased work of breathing. Diminished and consolidated breath sounds in bases. No wheezes, rhonchi, or rales appreciated. Abdomen: Soft, non-distended, and non-tender to palpation.  MSK: Generalized weakness. Extremities: Trace bilateral lower extremity edema. Skin: Warm and dry. Bruising on arms and face. Neuro: Alert and oriented x3. No focal deficits. Psych: Normal affect. Responds appropriately.   Labs    High Sensitivity Troponin:   Recent Labs  Lab 08/11/19 1333 08/11/19 1732  TROPONINIHS 16 16      Chemistry Recent Labs  Lab 08/11/19 1333 08/12/19 0532 08/13/19 0513  NA 142 140 141  K 4.4 4.3 4.5  CL 103 101 97*  CO2 30 30 35*  GLUCOSE 124* 89 89  BUN 39* 34* 36*  CREATININE 1.54* 1.37* 1.82*  CALCIUM 8.5* 8.4* 8.6*  GFRNONAA 37* 43* 30*  GFRAA 43* 50* 35*  ANIONGAP 9 9 9      Hematology Recent Labs  Lab 08/11/19 1333  WBC 4.7  RBC 5.00  HGB 15.6  HCT 49.3  MCV 98.6  MCH 31.2  MCHC 31.6  RDW 16.4*  PLT 129*    BNP Recent Labs  Lab 08/11/19 1333  BNP 401.6*     DDimer No results for input(s): DDIMER in the last 168 hours.   Radiology    Dg Chest Port 1 View  Result Date: 08/11/2019 CLINICAL DATA:  Shortness of breath EXAM: PORTABLE CHEST 1 VIEW COMPARISON:  Mar 30, 2018 FINDINGS: The cardiac silhouette remains enlarged. There are aortic calcifications. There are moderate-sized bilateral pleural effusions with adjacent compressive atelectasis. There is no pneumothorax. There are end-stage degenerative changes of the left glenohumeral joint. IMPRESSION: 1. Cardiomegaly with findings consistent with congestive heart failure. 2. Moderate bilateral pleural effusions. 3. Compressive atelectasis at the lung bases bilaterally. Electronically Signed   By: Constance Holster M.D.   On: 08/11/2019 13:17    Cardiac Studies   Echocardiogram 04/01/2019: Study Conclusions: - Left ventricle: The cavity  size was mildly dilated. There was mild focal basal and mild concentric hypertrophy of the septum. Systolic function was severely reduced. The estimated ejection fraction was in the range of 20% to 25%. Diffuse hypokinesis. - Aortic valve: There was mild regurgitation. Valve area (VTI): 2.17 cm^2. Valve area (Vmax): 2.28 cm^2. Valve area (Vmean): 2.11 cm^2. - Mitral valve: There was moderate regurgitation. - Left atrium: The atrium was severely dilated. - Right ventricle: The cavity size was severely dilated. Wall thickness was normal. Systolic function was moderately reduced. - Right atrium: The atrium was moderately dilated. - Pulmonary arteries: Systolic pressure was mildly increased. PA peak pressure: 37 mm Hg (S). - Pericardium, extracardiac: A trivial pericardial effusion was identified posterior to the heart. Features were not consistent with tamponade physiology. There was a left pleural effusion.  Impressions: - When compared to the prior study from 04/07/2017 LVEF has further decreased now 20-25% with diffuse hypokinesis. RVEF is moderately decreased, previously normal.  Patient Profile     [name] [name] is a 85 y.o. male with a history of questionable CAD, acute on chronic systolic CHF with EF of 0000000 on Echo in 03/2018, persistent atrial fibrillation s/p multiple DCCV last in 03/2018 maintained on Amiodarone and Eliquis, hypertension, hyperlipidemia, CKD stage III, depression, and anxiety who is being seen today for the evaluation of CHF at the request of Dr. Posey Pronto (Internal Medicine).  Assessment & Plan    Acute on Chronic Systolic CHF - Patient presented with shortness of breath and productive cough x1 week.  - Chest x-ray showed cardiomegaly with moderate sized bilateral pleural effusion with adjacent compressive atelectasis. - BNP elevated at 401.6.  - Most recent Echo from 03/2018 showed LVEF of 20-25% with diffuse hypokinesis. - Patient currently  on IV Lasix 40mg  twice daily. Documented urinary output of 3.3 L in the last 24 hours. Creatinine up from 1.37 yesterday to 1.82 today. - Will hold Lasix today and restart at 40mg  daily tomorrow. - Patient previously on Cardizem but this was switched to Lopressor yesterday due to reduced EF. - Patient not on ACEi/ARB/ARNI or Spironolactone at home. Possible due to CKD.  - Continue to monitor daily weights, strict I/O's, and renal function. - Will also ordered repeat chest x-ray today per MD.  Chronic Atrial Fibrillation - Patient s/p multiple DCCV with the last one being in 03/2018. - Telemetry shows atrial fibrillation with rates in the 80's to 90's and PVCs. - Home Cardizem 30mg  twice daily was switched to Lopressor 12.5mg  twice daily yesterday given reduced EF. - Continue home Amiodarone 200mg  daily. - Continue chronic anticoagulation with Eliquis 2.5mg  twice daily (reduced dose due to age and CKD with serum creatinine often > 1.5). CHA2DS2-VASc =  4-5  (CHF, HTN, age x2, and questionable CAD). However, patient has had multiple falls and is very frail. I had brief discussion with patient about stroke risk vs bleeding risk on 08/12/2019 but we may need to discuss this more going forward.  Questionable CAD - Patient has history of CAD listed in chart. However, I do not see any evidence of previous ischemic work-up. There is an office visit note from 2017 that mentions previous abnormal stress test several years ago where the decision was made to treat medically. However, patient denies ever having stress test or cardiac cath.  Hypertension - BP soft but stable. Most recent BP 99/80. - Lopressor 12.5mg  twice daily. - Will hold Lasix today as stated above.  Hyperlipidemia - Continue home Pravastatin.  CKD Stage III - Serum creatinine 1.54 on admission. Baseline around 1.4 to 1.6. - Creatinine up from 1.37 yesterday to 1.8 today. Will hold Lasix today as above. - Continue daily BMET.  For  questions or updates, please contact [place] Please consult [email or web address] for contact info under        Signed, Darreld Mclean, PA-C  08/13/2019, 10:54 AM    Attending Note:   The patient was seen and examined.  Agree with assessment and plan as noted above.  Changes made to the above note as needed.  Patient seen and independently examined with  Sande Rives, PA .   We discussed all aspects of the encounter. I agree with the assessment and plan as stated above.  1.  Chronic systolic chf:    Has diuresed 3.3 liters and has had an increase in his creatinine up to 1.8.  Recommend holding lasix tonight and likely tomorrow .  We can reassess  Him over the weekend and give the lasix sooner if needed  We have stopped his diltiazem and added metoprolol    2.  Atrial fib:   We have changed his diltiazem to metoprolol since he has chronic systolic CHF  3.  HTN:   BP is low at present   4.  Hyperlipidemia:   Cont pravachol    I have spent a total of 40 minutes with patient reviewing hospital  notes , telemetry, EKGs, labs and examining patient as well as establishing an assessment and plan that was discussed with the patient. > 50% of time was spent in direct patient care.    Thayer Headings, Brooke Bonito., MD, [facility] 08/13/2019, 12:08 PM [address],  [place] Pager [phone]

## 2019-08-13 NOTE — Care Management Important Message (Signed)
Important Message  Patient Details IM Letter given to Dessa Phi RN to present to the Patient Name: [name] [name] MRN: [id number] Date of Birth: 04/04/1920   Medicare Important Message Given:  Yes     Kerin Salen 08/13/2019, 12:14 PM

## 2019-08-13 NOTE — Progress Notes (Signed)
[facility]                                                                              Patient Demographics  [name] [name], is a 85 y.o. male, DOB - 1920-10-13, [id number]  Admit date - 08/11/2019   Admitting Physician Lavina Hamman, MD  Outpatient Primary MD for the patient is Plotnikov, Evie Lacks, MD  Outpatient specialists:   LOS - 2  days   Medical records reviewed and are as summarized below:    Chief Complaint  Patient presents with  . Cough    x1 week (productive)        Brief summary   [name] [name] is a 85 y.o. male with Past medical history of chronic combined CHF, A. fib, CKD stage III, on chronic Eliquis. Patient presented with complaints of 2 days of cough while sleeping.  He denies having any complaints of chest pain or chest tightness.  No palpitation.  No nausea no vomiting no fever no chills.  He remains compliant with all his medications at home. For last 2 days he started having complaints of cough and he was sleeping and it will wake him up from sleep. It was progressively worsening and then he was seen by home health RN today on 08/11/2019 who referred him to emergency room. At baseline, ambulates with assistance, in ED O2 sats dropped to 85% on room air, chest x-ray showed bilateral congestion.  Assessment & Plan    Principal Problem:   Acute on chronic combined systolic and diastolic CHF, acute hypoxic respiratory failure  -Presented with orthopnea, PND, shortness of breath.  In ED 85% on room air requiring 2 L O2 via nasal cannula -Hest x-ray showed moderate bilateral pleural effusions, compressive atelectasis bilaterally -Serial troponins unremarkable, EKG unremarkable, BNP elevated 401, had improvement in symptoms with Lasix given in ED -Cardiology consulted.  2D echo 03/2018 had shown EF of 20 to 25% with diffuse hypokinesis - Patient is not on ACE/ARB, spironolactone at home possibly due to CKD.  Not on beta-blocker at home due  to history of bradycardia.  Patient however has been taking Cardizem 30 mg twice daily. -Patient follows Dr. Percival Spanish, per last note 04/07/2019 patient was felt to be at end-of-life with his advanced age, EF of 20%.  Patient had not wanted nursing home placement or discussions with palliative care in the past. -On torsemide at home - Cr trended up to 1.8 today.  Cardiology admitted holding Lasix today and tomorrow.  Patient not severely short of breath, will reassess in a.m.  Chest x-ray today however still shows moderate-sized bilateral pleural effusions with associated atelectasis.  Active Problems: Persistent atrial fibrillation -Status post multiple DCCV, last in 03/2018, still in A. Fib -Currently rate controlled.  Patient is on amiodarone and Cardizem at home. -Continue low-dose Eliquis -Cardiology recommended low-dose beta-blocker for rate control, avoid Cardizem with reduced EF.  Cardizem discontinued.    Dyslipidemia -Continue Pravachol    Essential hypertension -BP currently soft, holding Lasix today and tomorrow.  Continue beta-blocker  BPH Continue home regimen, finasteride  Chronic constipation Continue home regimen of stool softeners  Code  Status: DNR DVT Prophylaxis: Apixaban Family Communication: Discussed all imaging results, lab results, explained to the patient    Disposition Plan: Creatinine trended up to 1.8 today, holding Lasix.  Time Spent in minutes 25 minutes  Procedures:  None  Consultants:   Cardiology  Antimicrobials:   Anti-infectives (From admission, onward)   None         Medications  Scheduled Meds: . ALPRAZolam  1 mg Oral QHS  . amiodarone  200 mg Oral Daily  . apixaban  2.5 mg Oral BID  . finasteride  5 mg Oral QHS  . [START ON 08/15/2019] furosemide  40 mg Intravenous Daily  . metoprolol tartrate  12.5 mg Oral BID  . mirtazapine  7.5 mg Oral QHS  . polyethylene glycol  17 g Oral QHS  . pravastatin  20 mg Oral Daily  .  senna-docusate  1 tablet Oral BID  . sodium chloride flush  3 mL Intravenous Q12H   Continuous Infusions: . sodium chloride     PRN Meds:.sodium chloride, acetaminophen, albuterol, ondansetron (ZOFRAN) IV, sodium chloride flush      Subjective:   [name] [name] was seen and examined today.  Denies any chest pain, no acute events overnight.  Does not appear very short of breath however still taking shallow breaths.  No fevers or chills . Patient denies dizziness, abdominal pain, N/V/D/C, new weakness, numbess, tingling. No acute events overnight.    Objective:   Vitals:   08/12/19 2157 08/13/19 0541 08/13/19 1129 08/13/19 1344  BP: 100/83 99/80 114/72 104/78  Pulse: 86 81 86 90  Resp: 16 16  18   Temp: 98.4 F (36.9 C) 97.7 F (36.5 C)  97.7 F (36.5 C)  TempSrc: Oral Oral    SpO2: 98% 98%  90%  Weight:  69.4 kg    Height:        Intake/Output Summary (Last 24 hours) at 08/13/2019 1552 Last data filed at 08/13/2019 1000 Gross per 24 hour  Intake 360 ml  Output 2700 ml  Net -2340 ml     Wt Readings from Last 3 Encounters:  08/13/19 69.4 kg  07/07/19 73.7 kg  05/17/19 75.3 kg    Physical Exam  General: Alert and oriented x 3, NAD  Eyes:   HEENT:  Atraumatic, normocephalic  Cardiovascular: Irregularly irregular   Respiratory: Diminished breath sounds throughout  Gastrointestinal: Soft, nontender, nondistended, NBS  Ext: Trace pedal edema bilaterally  Neuro: no new deficits  Musculoskeletal: No cyanosis, clubbing  Skin: No rashes  Psych: Normal affect and demeanor, alert and oriented x3     Data Reviewed:  I have personally reviewed following labs and imaging studies  Micro Results Recent Results (from the past 240 hour(s))  SARS Coronavirus 2 [facility] order, Performed in [place] hospital lab) Nasopharyngeal Nasopharyngeal Swab     Status: None   Collection Time: 08/11/19  1:33 PM   Specimen: Nasopharyngeal Swab  Result Value Ref Range Status    SARS Coronavirus 2 NEGATIVE NEGATIVE Final    Comment: (NOTE) If result is NEGATIVE SARS-CoV-2 target nucleic acids are NOT DETECTED. The SARS-CoV-2 RNA is generally detectable in upper and lower  respiratory specimens during the acute phase of infection. The lowest  concentration of SARS-CoV-2 viral copies this assay can detect is 250  copies / mL. A negative result does not preclude SARS-CoV-2 infection  and should not be used as the sole basis for treatment or other  patient management decisions.  A negative result may  occur with  improper specimen collection / handling, submission of specimen other  than nasopharyngeal swab, presence of viral mutation(s) within the  areas targeted by this assay, and inadequate number of viral copies  (<250 copies / mL). A negative result must be combined with clinical  observations, patient history, and epidemiological information. If result is POSITIVE SARS-CoV-2 target nucleic acids are DETECTED. The SARS-CoV-2 RNA is generally detectable in upper and lower  respiratory specimens dur ing the acute phase of infection.  Positive  results are indicative of active infection with SARS-CoV-2.  Clinical  correlation with patient history and other diagnostic information is  necessary to determine patient infection status.  Positive results do  not rule out bacterial infection or co-infection with other viruses. If result is PRESUMPTIVE POSTIVE SARS-CoV-2 nucleic acids MAY BE PRESENT.   A presumptive positive result was obtained on the submitted specimen  and confirmed on repeat testing.  While 2019 novel coronavirus  (SARS-CoV-2) nucleic acids may be present in the submitted sample  additional confirmatory testing may be necessary for epidemiological  and / or clinical management purposes  to differentiate between  SARS-CoV-2 and other Sarbecovirus currently known to infect humans.  If clinically indicated additional testing with an alternate test   methodology [phone]) is advised. The SARS-CoV-2 RNA is generally  detectable in upper and lower respiratory sp ecimens during the acute  phase of infection. The expected result is Negative. Fact Sheet for Patients:  StrictlyIdeas.no Fact Sheet for Healthcare Providers: BankingDealers.co.za This test is not yet approved or cleared by the [place] FDA and has been authorized for detection and/or diagnosis of SARS-CoV-2 by FDA under an Emergency Use Authorization (EUA).  This EUA will remain in effect (meaning this test can be used) for the duration of the COVID-19 declaration under Section 564(b)(1) of the Act, 21 U.S.C. section 360bbb-3(b)(1), unless the authorization is terminated or revoked sooner. Performed at [facility], [place] [address] [place], [place] [address]     Radiology Reports Dg Chest 2 View  Result Date: 08/13/2019 CLINICAL DATA:  Shortness of breath. EXAM: CHEST - 2 VIEW COMPARISON:  Radiograph of August 11, 2019. FINDINGS: Stable cardiomegaly. No pneumothorax is noted. Atherosclerosis of thoracic aorta is noted. Moderate bilateral pleural effusions are noted with associated atelectasis. Severe degenerative changes seen involving both glenohumeral joints. IMPRESSION: Moderate size bilateral pleural effusions are noted with associated atelectasis. Aortic Atherosclerosis (ICD10-I70.0). Electronically Signed   By: Marijo Conception M.D.   On: 08/13/2019 14:11   Dg Chest Port 1 View  Result Date: 08/11/2019 CLINICAL DATA:  Shortness of breath EXAM: PORTABLE CHEST 1 VIEW COMPARISON:  Mar 30, 2018 FINDINGS: The cardiac silhouette remains enlarged. There are aortic calcifications. There are moderate-sized bilateral pleural effusions with adjacent compressive atelectasis. There is no pneumothorax. There are end-stage degenerative changes of the left glenohumeral joint. IMPRESSION: 1. Cardiomegaly with findings  consistent with congestive heart failure. 2. Moderate bilateral pleural effusions. 3. Compressive atelectasis at the lung bases bilaterally. Electronically Signed   By: Constance Holster M.D.   On: 08/11/2019 13:17    Lab Data:  CBC: Recent Labs  Lab 08/11/19 1333  WBC 4.7  NEUTROABS 3.3  HGB 15.6  HCT 49.3  MCV 98.6  PLT [id number]*   Basic Metabolic Panel: Recent Labs  Lab 08/11/19 1333 08/12/19 0532 08/13/19 0513  NA 142 140 141  K 4.4 4.3 4.5  CL 103 101 97*  CO2 30 30 35*  GLUCOSE 124* 89  89  BUN 39* 34* 36*  CREATININE 1.54* 1.37* 1.82*  CALCIUM 8.5* 8.4* 8.6*   GFR: Estimated Creatinine Clearance: 22.4 mL/min (A) (by C-G formula based on SCr of 1.82 mg/dL (H)). Liver Function Tests: No results for input(s): AST, ALT, ALKPHOS, BILITOT, PROT, ALBUMIN in the last 168 hours. No results for input(s): LIPASE, AMYLASE in the last 168 hours. No results for input(s): AMMONIA in the last 168 hours. Coagulation Profile: No results for input(s): INR, PROTIME in the last 168 hours. Cardiac Enzymes: No results for input(s): CKTOTAL, CKMB, CKMBINDEX, TROPONINI in the last 168 hours. BNP (last 3 results) No results for input(s): PROBNP in the last 8760 hours. HbA1C: No results for input(s): HGBA1C in the last 72 hours. CBG: No results for input(s): GLUCAP in the last 168 hours. Lipid Profile: No results for input(s): CHOL, HDL, LDLCALC, TRIG, CHOLHDL, LDLDIRECT in the last 72 hours. Thyroid Function Tests: No results for input(s): TSH, T4TOTAL, FREET4, T3FREE, THYROIDAB in the last 72 hours. Anemia Panel: No results for input(s): VITAMINB12, FOLATE, FERRITIN, TIBC, IRON, RETICCTPCT in the last 72 hours. Urine analysis:    Component Value Date/Time   COLORURINE AMBER (A) 10/14/2018 1112   APPEARANCEUR CLOUDY (A) 10/14/2018 1112   LABSPEC 1.013 10/14/2018 1112   PHURINE 6.0 10/14/2018 1112   GLUCOSEU NEGATIVE 10/14/2018 1112   GLUCOSEU NEGATIVE 10/23/2017 1245   HGBUR  MODERATE (A) 10/14/2018 1112   BILIRUBINUR NEGATIVE 10/14/2018 1112   KETONESUR NEGATIVE 10/14/2018 1112   PROTEINUR 100 (A) 10/14/2018 1112   UROBILINOGEN 0.2 10/23/2017 1245   NITRITE NEGATIVE 10/14/2018 1112   LEUKOCYTESUR LARGE (A) 10/14/2018 1112     Earnest Thalman M.D. [facility] 08/13/2019, 3:52 PM  Pager: [phone] Between 7am to 7pm - call Pager - [phone]  After 7pm go to [email or web address] - password [email or web address]  Call night coverage person covering after 7pm

## 2019-08-14 DIAGNOSIS — I5021 Acute systolic (congestive) heart failure: Secondary | ICD-10-CM

## 2019-08-14 DIAGNOSIS — I482 Chronic atrial fibrillation, unspecified: Secondary | ICD-10-CM

## 2019-08-14 DIAGNOSIS — N183 Chronic kidney disease, stage 3 (moderate): Secondary | ICD-10-CM

## 2019-08-14 DIAGNOSIS — J9601 Acute respiratory failure with hypoxia: Secondary | ICD-10-CM

## 2019-08-14 DIAGNOSIS — Z7901 Long term (current) use of anticoagulants: Secondary | ICD-10-CM

## 2019-08-14 LAB — BASIC METABOLIC PANEL
Anion gap: 11 (ref 5–15)
BUN: 41 mg/dL — ABNORMAL HIGH (ref 8–23)
CO2: 30 mmol/L (ref 22–32)
Calcium: 8.4 mg/dL — ABNORMAL LOW (ref 8.9–10.3)
Chloride: 97 mmol/L — ABNORMAL LOW (ref 98–111)
Creatinine, Ser: 1.68 mg/dL — ABNORMAL HIGH (ref 0.61–1.24)
GFR calc Af Amer: 39 mL/min — ABNORMAL LOW (ref >60)
GFR calc non Af Amer: 34 mL/min — ABNORMAL LOW (ref >60)
Glucose, Bld: 84 mg/dL (ref 70–99)
Potassium: 5.4 mmol/L — ABNORMAL HIGH (ref 3.5–5.1)
Sodium: 138 mmol/L (ref 135–145)

## 2019-08-14 MED ORDER — SODIUM ZIRCONIUM CYCLOSILICATE 10 G PO PACK
10.0000 g | PACK | Freq: Once | ORAL | Status: AC
  Administered 2019-08-14: 10 g via ORAL
  Filled 2019-08-14: qty 1

## 2019-08-14 MED ORDER — TORSEMIDE 10 MG PO TABS
10.0000 mg | ORAL_TABLET | Freq: Every day | ORAL | Status: DC
  Administered 2019-08-14 – 2019-08-16 (×3): 10 mg via ORAL
  Filled 2019-08-14 (×3): qty 1

## 2019-08-14 NOTE — Progress Notes (Signed)
[facility]                                                                              Patient Demographics  [name] [name], is a 85 y.o. male, DOB - December 27, 1919, [id number]  Admit date - 08/11/2019   Admitting Physician Lavina Hamman, MD  Outpatient Primary MD for the patient is Plotnikov, Evie Lacks, MD  Outpatient specialists:   LOS - 3  days   Medical records reviewed and are as summarized below:    Chief Complaint  Patient presents with  . Cough    x1 week (productive)        Brief summary   [name] [name] is a 85 y.o. male with Past medical history of chronic combined CHF, A. fib, CKD stage III, on chronic Eliquis. Patient presented with complaints of 2 days of cough while sleeping.  He denies having any complaints of chest pain or chest tightness.  No palpitation.  No nausea no vomiting no fever no chills.  He remains compliant with all his medications at home. For last 2 days he started having complaints of cough and he was sleeping and it will wake him up from sleep. It was progressively worsening and then he was seen by home health RN today on 08/11/2019 who referred him to emergency room. At baseline, ambulates with assistance, in ED O2 sats dropped to 85% on room air, chest x-ray showed bilateral congestion.  Assessment & Plan    Principal Problem:   Acute on chronic combined systolic and diastolic CHF, acute hypoxic respiratory failure, bilateral pleural effusions -Presented with orthopnea, PND, shortness of breath.  In ED 85% on room air requiring 2 L O2 via nasal cannula -chest x-ray showed moderate bilateral pleural effusions, compressive atelectasis bilaterally -Serial troponins unremarkable, EKG unremarkable, BNP elevated 401, had improvement in symptoms with Lasix given in ED -Cardiology was consulted.  2D echo 03/2018 had shown EF of 20 to 25% with diffuse hypokinesis - Patient is not on ACE/ARB, spironolactone at home possibly due to CKD.   Not on beta-blocker at home due to history of bradycardia.  Patient however has been taking Cardizem 30 mg twice daily. -Patient follows Dr. Percival Spanish, per last note 04/07/2019 patient was felt to be at end-of-life with his advanced age, EF of 20%.  Patient had not wanted nursing home placement or discussions with palliative care in the past. -On torsemide at home. Discussed with Dr. Sallyanne Kuster, recommended restarting oral diuretics today, placed on torsemide daily -Difficult to increase diuretics due to borderline BP  Active Problems: Bilateral pleural effusion -Chest x-ray 9/25 had shown bilateral pleural effusions, left effusion appears to be new, right pleural effusion possibly chronic seen on previous imagings  -Will likely benefit from thoracentesis.  Discussed with the patient, called patient's son (was able to discuss with patient's daughter-in-law who is a Marine scientist), will address again in a.m.   - Patient was not agreeable for thoracentesis.  Persistent atrial fibrillation -Status post multiple DCCV, last in 03/2018, still in A. Fib -Currently rate controlled.  Patient is on amiodarone and Cardizem at home. -Continue low-dose Eliquis -Cardiology recommended low-dose beta-blocker  for rate control, avoid Cardizem with reduced EF.  Cardizem discontinued.    Dyslipidemia -Continue Pravachol    Essential hypertension -BP currently soft, continue torsemide, beta-blocker  BPH Continue home regimen, finasteride  Chronic constipation Continue home regimen of stool softeners  Code Status: DNR DVT Prophylaxis: Apixaban Family Communication: Discussed all imaging results, lab results, explained to the patient and discussed with daughter-in-law in detail.  Unable to reach his son today   Disposition Plan: Hopefully DC home in 24 to 48 hours.  Time Spent in minutes 25 minutes  Procedures:  None  Consultants:   Cardiology  Antimicrobials:   Anti-infectives (From admission, onward)    None         Medications  Scheduled Meds: . ALPRAZolam  1 mg Oral QHS  . amiodarone  200 mg Oral Daily  . apixaban  2.5 mg Oral BID  . finasteride  5 mg Oral QHS  . metoprolol tartrate  12.5 mg Oral BID  . mirtazapine  7.5 mg Oral QHS  . polyethylene glycol  17 g Oral QHS  . pravastatin  20 mg Oral Daily  . senna-docusate  1 tablet Oral BID  . sodium chloride flush  3 mL Intravenous Q12H  . torsemide  10 mg Oral Daily   Continuous Infusions: . sodium chloride     PRN Meds:.sodium chloride, acetaminophen, albuterol, ondansetron (ZOFRAN) IV, sodium chloride flush      Subjective:   [name] [name] was seen and examined today.  Still has shortness of breath, borderline BP sounds congested.  No fevers or chills.  Patient denies dizziness, abdominal pain, N/V/D/C, new weakness, numbess, tingling. No acute events overnight.    Objective:   Vitals:   08/13/19 1344 08/13/19 2018 08/14/19 0520 08/14/19 1338  BP: 104/78 108/71 101/82 104/64  Pulse: 90 (!) 101 89 62  Resp: 18 16 16  (!) 22  Temp: 97.7 F (36.5 C) 97.8 F (36.6 C) 97.6 F (36.4 C) (!) 97.2 F (36.2 C)  TempSrc:  Oral Oral   SpO2: 90% 99% 96% 100%  Weight:      Height:        Intake/Output Summary (Last 24 hours) at 08/14/2019 1911 Last data filed at 08/14/2019 1329 Gross per 24 hour  Intake 300 ml  Output 650 ml  Net -350 ml     Wt Readings from Last 3 Encounters:  08/13/19 69.4 kg  07/07/19 73.7 kg  05/17/19 75.3 kg   Physical Exam  General: Alert and oriented x 3, NAD  Eyes:   HEENT:  Atraumatic, normocephalic  Cardiovascular: S1 S2 clear, no murmurs, RRR. No pedal edema b/l  Respiratory: Diminished breath sound at the bases  Gastrointestinal: Soft, nontender, nondistended, NBS  Ext: no pedal edema bilaterally  Neuro: no new deficits  Musculoskeletal: No cyanosis, clubbing  Skin: No rashes  Psych: Normal affect and demeanor, alert and oriented x3     Data Reviewed:  I have  personally reviewed following labs and imaging studies  Micro Results Recent Results (from the past 240 hour(s))  SARS Coronavirus 2 [facility] order, Performed in [facility] hospital lab) Nasopharyngeal Nasopharyngeal Swab     Status: None   Collection Time: 08/11/19  1:33 PM   Specimen: Nasopharyngeal Swab  Result Value Ref Range Status   SARS Coronavirus 2 NEGATIVE NEGATIVE Final    Comment: (NOTE) If result is NEGATIVE SARS-CoV-2 target nucleic acids are NOT DETECTED. The SARS-CoV-2 RNA is generally detectable in upper and lower  respiratory specimens  during the acute phase of infection. The lowest  concentration of SARS-CoV-2 viral copies this assay can detect is 250  copies / mL. A negative result does not preclude SARS-CoV-2 infection  and should not be used as the sole basis for treatment or other  patient management decisions.  A negative result may occur with  improper specimen collection / handling, submission of specimen other  than nasopharyngeal swab, presence of viral mutation(s) within the  areas targeted by this assay, and inadequate number of viral copies  (<250 copies / mL). A negative result must be combined with clinical  observations, patient history, and epidemiological information. If result is POSITIVE SARS-CoV-2 target nucleic acids are DETECTED. The SARS-CoV-2 RNA is generally detectable in upper and lower  respiratory specimens dur ing the acute phase of infection.  Positive  results are indicative of active infection with SARS-CoV-2.  Clinical  correlation with patient history and other diagnostic information is  necessary to determine patient infection status.  Positive results do  not rule out bacterial infection or co-infection with other viruses. If result is PRESUMPTIVE POSTIVE SARS-CoV-2 nucleic acids MAY BE PRESENT.   A presumptive positive result was obtained on the submitted specimen  and confirmed on repeat testing.  While 2019 novel  coronavirus  (SARS-CoV-2) nucleic acids may be present in the submitted sample  additional confirmatory testing may be necessary for epidemiological  and / or clinical management purposes  to differentiate between  SARS-CoV-2 and other Sarbecovirus currently known to infect humans.  If clinically indicated additional testing with an alternate test  methodology [phone]) is advised. The SARS-CoV-2 RNA is generally  detectable in upper and lower respiratory sp ecimens during the acute  phase of infection. The expected result is Negative. Fact Sheet for Patients:  StrictlyIdeas.no Fact Sheet for Healthcare Providers: BankingDealers.co.za This test is not yet approved or cleared by the [place] FDA and has been authorized for detection and/or diagnosis of SARS-CoV-2 by FDA under an Emergency Use Authorization (EUA).  This EUA will remain in effect (meaning this test can be used) for the duration of the COVID-19 declaration under Section 564(b)(1) of the Act, 21 U.S.C. section 360bbb-3(b)(1), unless the authorization is terminated or revoked sooner. Performed at [facility], [place] [address], [place], [place] [address]     Radiology Reports Dg Chest 2 View  Result Date: 08/13/2019 CLINICAL DATA:  Shortness of breath. EXAM: CHEST - 2 VIEW COMPARISON:  Radiograph of August 11, 2019. FINDINGS: Stable cardiomegaly. No pneumothorax is noted. Atherosclerosis of thoracic aorta is noted. Moderate bilateral pleural effusions are noted with associated atelectasis. Severe degenerative changes seen involving both glenohumeral joints. IMPRESSION: Moderate size bilateral pleural effusions are noted with associated atelectasis. Aortic Atherosclerosis (ICD10-I70.0). Electronically Signed   By: Marijo Conception M.D.   On: 08/13/2019 14:11   Dg Chest Port 1 View  Result Date: 08/11/2019 CLINICAL DATA:  Shortness of breath EXAM: PORTABLE  CHEST 1 VIEW COMPARISON:  Mar 30, 2018 FINDINGS: The cardiac silhouette remains enlarged. There are aortic calcifications. There are moderate-sized bilateral pleural effusions with adjacent compressive atelectasis. There is no pneumothorax. There are end-stage degenerative changes of the left glenohumeral joint. IMPRESSION: 1. Cardiomegaly with findings consistent with congestive heart failure. 2. Moderate bilateral pleural effusions. 3. Compressive atelectasis at the lung bases bilaterally. Electronically Signed   By: Constance Holster M.D.   On: 08/11/2019 13:17    Lab Data:  CBC: Recent Labs  Lab 08/11/19 1333  WBC  4.7  NEUTROABS 3.3  HGB 15.6  HCT 49.3  MCV 98.6  PLT [id number]*   Basic Metabolic Panel: Recent Labs  Lab 08/11/19 1333 08/12/19 0532 08/13/19 0513 08/14/19 0553  NA 142 140 141 138  K 4.4 4.3 4.5 5.4*  CL 103 101 97* 97*  CO2 30 30 35* 30  GLUCOSE 124* 89 89 84  BUN 39* 34* 36* 41*  CREATININE 1.54* 1.37* 1.82* 1.68*  CALCIUM 8.5* 8.4* 8.6* 8.4*   GFR: Estimated Creatinine Clearance: 24.3 mL/min (A) (by C-G formula based on SCr of 1.68 mg/dL (H)). Liver Function Tests: No results for input(s): AST, ALT, ALKPHOS, BILITOT, PROT, ALBUMIN in the last 168 hours. No results for input(s): LIPASE, AMYLASE in the last 168 hours. No results for input(s): AMMONIA in the last 168 hours. Coagulation Profile: No results for input(s): INR, PROTIME in the last 168 hours. Cardiac Enzymes: No results for input(s): CKTOTAL, CKMB, CKMBINDEX, TROPONINI in the last 168 hours. BNP (last 3 results) No results for input(s): PROBNP in the last 8760 hours. HbA1C: No results for input(s): HGBA1C in the last 72 hours. CBG: No results for input(s): GLUCAP in the last 168 hours. Lipid Profile: No results for input(s): CHOL, HDL, LDLCALC, TRIG, CHOLHDL, LDLDIRECT in the last 72 hours. Thyroid Function Tests: No results for input(s): TSH, T4TOTAL, FREET4, T3FREE, THYROIDAB in the last 72  hours. Anemia Panel: No results for input(s): VITAMINB12, FOLATE, FERRITIN, TIBC, IRON, RETICCTPCT in the last 72 hours. Urine analysis:    Component Value Date/Time   COLORURINE AMBER (A) 10/14/2018 1112   APPEARANCEUR CLOUDY (A) 10/14/2018 1112   LABSPEC 1.013 10/14/2018 1112   PHURINE 6.0 10/14/2018 1112   GLUCOSEU NEGATIVE 10/14/2018 1112   GLUCOSEU NEGATIVE 10/23/2017 1245   HGBUR MODERATE (A) 10/14/2018 1112   BILIRUBINUR NEGATIVE 10/14/2018 1112   KETONESUR NEGATIVE 10/14/2018 1112   PROTEINUR 100 (A) 10/14/2018 1112   UROBILINOGEN 0.2 10/23/2017 1245   NITRITE NEGATIVE 10/14/2018 1112   LEUKOCYTESUR LARGE (A) 10/14/2018 1112       M.D. [facility] 08/14/2019, 7:11 PM  Pager: [phone] Between 7am to 7pm - call Pager - [phone]  After 7pm go to [email or web address] - password [email or web address]  Call night coverage person covering after 7pm

## 2019-08-14 NOTE — Progress Notes (Signed)
Progress Note  Patient Name: [name] [name] Date of Encounter: 08/14/2019  Primary Cardiologist: Minus Breeding, MD   Subjective   Feels better, enquires about discharge home. Still has a cough, sounds a little "wet". Slight net negative fluid balance last 24h, net -3.6L since admission. Weight down 12 lb since admission (accuracy of initial weight uncertain). Creatinine better since yesterday. K 5.4 Moderate bilateral pleural effusions on yesterday CXR. R pleural effusion may be chronic (see CXR Nov 2019), but L effusion is new.  Inpatient Medications    Scheduled Meds: . ALPRAZolam  1 mg Oral QHS  . amiodarone  200 mg Oral Daily  . apixaban  2.5 mg Oral BID  . finasteride  5 mg Oral QHS  . [START ON 08/15/2019] furosemide  40 mg Intravenous Daily  . metoprolol tartrate  12.5 mg Oral BID  . mirtazapine  7.5 mg Oral QHS  . polyethylene glycol  17 g Oral QHS  . pravastatin  20 mg Oral Daily  . senna-docusate  1 tablet Oral BID  . sodium chloride flush  3 mL Intravenous Q12H   Continuous Infusions: . sodium chloride     PRN Meds: sodium chloride, acetaminophen, albuterol, ondansetron (ZOFRAN) IV, sodium chloride flush   Vital Signs    Vitals:   08/13/19 1129 08/13/19 1344 08/13/19 2018 08/14/19 0520  BP: 114/72 104/78 108/71 101/82  Pulse: 86 90 (!) 101 89  Resp:  18 16 16   Temp:  97.7 F (36.5 C) 97.8 F (36.6 C) 97.6 F (36.4 C)  TempSrc:   Oral Oral  SpO2:  90% 99% 96%  Weight:      Height:        Intake/Output Summary (Last 24 hours) at 08/14/2019 0933 Last data filed at 08/14/2019 0119 Gross per 24 hour  Intake 510 ml  Output 800 ml  Net -290 ml   Last 3 Weights 08/13/2019 08/11/2019 08/11/2019  Weight (lbs) 153 lb 154 lb 5.9 oz 165 lb  Weight (kg) 69.4 kg 70.02 kg 74.844 kg      Telemetry    AFib , rate conrolled, occ PVCs - Personally Reviewed  ECG    AFib, RBBB+LAFB - Personally Reviewed  Physical Exam  Appears comfortable at 83 deg HOB.  GEN: No acute distress.  Opacification R corneea Neck: No JVD Cardiac: irregular, no murmurs, rubs, or gallops.  Respiratory: Diminished breath sounds in both bases 1/3 up, no rales. GI: Soft, nontender, non-distended  MS: No edema; No deformity. Neuro:  Nonfocal  Psych: Normal affect   Labs    High Sensitivity Troponin:   Recent Labs  Lab 08/11/19 1333 08/11/19 1732  TROPONINIHS 16 16      Chemistry Recent Labs  Lab 08/12/19 0532 08/13/19 0513 08/14/19 0553  NA 140 141 138  K 4.3 4.5 5.4*  CL 101 97* 97*  CO2 30 35* 30  GLUCOSE 89 89 84  BUN 34* 36* 41*  CREATININE 1.37* 1.82* 1.68*  CALCIUM 8.4* 8.6* 8.4*  GFRNONAA 43* 30* 34*  GFRAA 50* 35* 39*  ANIONGAP 9 9 11      Hematology Recent Labs  Lab 08/11/19 1333  WBC 4.7  RBC 5.00  HGB 15.6  HCT 49.3  MCV 98.6  MCH 31.2  MCHC 31.6  RDW 16.4*  PLT 129*    BNP Recent Labs  Lab 08/11/19 1333  BNP 401.6*     DDimer No results for input(s): DDIMER in the last 168 hours.   Radiology  Dg Chest 2 View  Result Date: 08/13/2019 CLINICAL DATA:  Shortness of breath. EXAM: CHEST - 2 VIEW COMPARISON:  Radiograph of August 11, 2019. FINDINGS: Stable cardiomegaly. No pneumothorax is noted. Atherosclerosis of thoracic aorta is noted. Moderate bilateral pleural effusions are noted with associated atelectasis. Severe degenerative changes seen involving both glenohumeral joints. IMPRESSION: Moderate size bilateral pleural effusions are noted with associated atelectasis. Aortic Atherosclerosis (ICD10-I70.0). Electronically Signed   By: Marijo Conception M.D.   On: 08/13/2019 14:11    Cardiac Studies   Echocardiogram 04/01/2019: Study Conclusions: - Left ventricle: The cavity size was mildly dilated. There was mild focal basal and mild concentric hypertrophy of the septum. Systolic function was severely reduced. The estimated ejection fraction was in the range of 20% to 25%. Diffuse hypokinesis. - Aortic  valve: There was mild regurgitation. Valve area (VTI): 2.17 cm^2. Valve area (Vmax): 2.28 cm^2. Valve area (Vmean): 2.11 cm^2. - Mitral valve: There was moderate regurgitation. - Left atrium: The atrium was severely dilated. - Right ventricle: The cavity size was severely dilated. Wall thickness was normal. Systolic function was moderately reduced. - Right atrium: The atrium was moderately dilated. - Pulmonary arteries: Systolic pressure was mildly increased. PA peak pressure: 37 mm Hg (S). - Pericardium, extracardiac: A trivial pericardial effusion was identified posterior to the heart. Features were not consistent with tamponade physiology. There was a left pleural effusion.  Impressions: - When compared to the prior study from 04/07/2017 LVEF has further decreased now 20-25% with diffuse hypokinesis. RVEF is moderately decreased, previously normal.  Patient Profile     85 y.o. veteran [employer] with acute on chronic systolic CHF with EF of 0000000 on Echo in 03/2018, persistent atrial fibrillation s/p multiple DCCV last in 03/2018, maintained on Amiodarone and Eliquis, questionable history of CAD, hyperlipidemia, CKD stage III, depression, and anxiety.  Assessment & Plan    1. CHF: good net diuresis, no pulmonary edema, but fairly large bilateral effusions. Still on O2. Limited room for medical Rx due to low BP and abnormal renal function. Could benefit from palliative thoracentesis. Restart Oral diuretics. Plan a home weight based regimen of torsemide: Torsemide 10 mg daily, increase to 20 mg on days that weight is 155 lb or higher. 2. Persistent AFib: failed repeated DCCV. On amio for rate control (relative hypotension). Started low dose metoprolol instead of diltiazem. On Apixaban. CHADSVasc 4-5. No recent falls or bleeding. 3. Ac on CKD3: improved creatinine after holding diuretic 24h. K 5.4 today. Not on K supplements.     For questions or updates, please  contact [place] Please consult [email or web address] for contact info under        Signed, Sanda Klein, MD  08/14/2019, 9:33 AM

## 2019-08-15 DIAGNOSIS — I509 Heart failure, unspecified: Secondary | ICD-10-CM

## 2019-08-15 DIAGNOSIS — N400 Enlarged prostate without lower urinary tract symptoms: Secondary | ICD-10-CM

## 2019-08-15 LAB — BASIC METABOLIC PANEL
Anion gap: 9 (ref 5–15)
BUN: 45 mg/dL — ABNORMAL HIGH (ref 8–23)
CO2: 31 mmol/L (ref 22–32)
Calcium: 8.5 mg/dL — ABNORMAL LOW (ref 8.9–10.3)
Chloride: 99 mmol/L (ref 98–111)
Creatinine, Ser: 1.48 mg/dL — ABNORMAL HIGH (ref 0.61–1.24)
GFR calc Af Amer: 45 mL/min — ABNORMAL LOW (ref >60)
GFR calc non Af Amer: 39 mL/min — ABNORMAL LOW (ref >60)
Glucose, Bld: 83 mg/dL (ref 70–99)
Potassium: 4.9 mmol/L (ref 3.5–5.1)
Sodium: 139 mmol/L (ref 135–145)

## 2019-08-15 MED ORDER — TORSEMIDE 10 MG PO TABS: 10.0000 mg | ORAL_TABLET | Freq: Every day | ORAL | 0 refills | Status: DC

## 2019-08-15 MED ORDER — METOPROLOL TARTRATE 25 MG PO TABS: 12.5000 mg | ORAL_TABLET | Freq: Two times a day (BID) | ORAL | 0 refills | Status: DC

## 2019-08-15 NOTE — Discharge Instructions (Addendum)
[name] [name],  You were in the hospital with a heart failure exacerbation. Your medications have been adjusted. You also have evidence of fluid around your lungs. You have opted not to have this managed at this time. Recommendations to follow-up with your primary care physician if your breathing worsens for possible management of that fluid. Your heart medications have been adjusted. Per the cardiologist, take Torsemide 10 mg daily, increase to 20 mg on days that weight is 154 lb or higher.   Heart Failure Education: 1. Weigh yourself EVERY morning after you go to the bathroom but before you eat or drink anything. Write this number down in a weight log/diary. If you gain 3 pounds overnight or 5 pounds in a week, call the office. 2. Take your medicines as prescribed. If you have concerns about your medications, please call us before you stop taking them.  3. Eat low salt foods--Limit salt (sodium) to 2000 mg per day. This will help prevent your body from holding onto fluid. Read food labels as many processed foods have a lot of sodium, especially canned goods and prepackaged meats. If you would like some assistance choosing low sodium foods, we would be happy to set you up with a nutritionist. 4. Stay as active as you can everyday. Staying active will give you more energy and make your muscles stronger. Start with 5 minutes at a time and work your way up to 30 minutes a day. Break up your activities--do some in the morning and some in the afternoon. Start with 3 days per week and work your way up to 5 days as you can.  If you have chest pain, feel short of breath, dizzy, or lightheaded, STOP. If you don't feel better after a short rest, call 911. If you do feel better, call the office to let us know you have symptoms with exercise. 5. Limit all fluids for the day to less than 2 liters. Fluid includes all drinks, coffee, juice, ice chips, soup, jello, and all other liquids.

## 2019-08-15 NOTE — Progress Notes (Addendum)
SATURATION QUALIFICATIONS: (This note is used to comply with regulatory documentation for home oxygen)  Patient Saturations on Room Air at Rest = 85-87%  Patient Saturations on Room Air while Ambulating = 84%  Patient Saturations on 3 Liters of oxygen while Ambulating = 92%  Please briefly explain why patient needs home oxygen: Pt O2 sat 85-87% while resting in bed on room air.

## 2019-08-15 NOTE — Progress Notes (Signed)
At this time, pt has no IV access. Discharge orders were acknowledged and IV was removed. Later, pt son was able to convince patient to go for thoracentesis. MD Nettey made aware. Will continue to monitor pt.

## 2019-08-15 NOTE — Progress Notes (Signed)
Progress Note  Patient Name: [name] [name] Date of Encounter: 08/15/2019  Primary Cardiologist: Minus Breeding, MD Additional 70 mL net diuresisi, 4.3L since admission.  Subjective   He is able to lie flat in bed.  No edema. Additional 700 mL net diuresis since yesterday, 4.3L negative since admission.  Weight stable at 153 pounds. Creatinine improved since yesterday.  Potassium in normal range.  Inpatient Medications    Scheduled Meds: . ALPRAZolam  1 mg Oral QHS  . amiodarone  200 mg Oral Daily  . apixaban  2.5 mg Oral BID  . finasteride  5 mg Oral QHS  . metoprolol tartrate  12.5 mg Oral BID  . mirtazapine  7.5 mg Oral QHS  . polyethylene glycol  17 g Oral QHS  . pravastatin  20 mg Oral Daily  . senna-docusate  1 tablet Oral BID  . sodium chloride flush  3 mL Intravenous Q12H  . torsemide  10 mg Oral Daily   Continuous Infusions: . sodium chloride     PRN Meds: sodium chloride, acetaminophen, albuterol, ondansetron (ZOFRAN) IV, sodium chloride flush   Vital Signs    Vitals:   08/14/19 0520 08/14/19 1338 08/14/19 2037 08/15/19 0417  BP: 101/82 104/64 112/88 108/70  Pulse: 89 62 71 74  Resp: 16 (!) 22 18 18   Temp: 97.6 F (36.4 C) (!) 97.2 F (36.2 C) (!) 97.5 F (36.4 C) (!) 97.5 F (36.4 C)  TempSrc: Oral  Oral Oral  SpO2: 96% 100% 100% 100%  Weight:    69.5 kg  Height:        Intake/Output Summary (Last 24 hours) at 08/15/2019 0928 Last data filed at 08/15/2019 0419 Gross per 24 hour  Intake 300 ml  Output 1000 ml  Net -700 ml   Last 3 Weights 08/15/2019 08/13/2019 08/11/2019  Weight (lbs) 153 lb 3.5 oz 153 lb 154 lb 5.9 oz  Weight (kg) 69.5 kg 69.4 kg 70.02 kg      Telemetry    Persistent atrial fibrillation with good ventricular rate control, occasional PVCs- Personally Reviewed  ECG    No new tracing- Personally Reviewed  Physical Exam  Appears well GEN: No acute distress.   Neck: No JVD Cardiac: irregular, no murmurs, rubs, or gallops.   Respiratory: , absent breath sounds bottom 1/3 bilaterally, otw clear to auscultation bilaterally. GI: Soft, nontender, non-distended  MS: No edema; No deformity. Neuro:  Nonfocal  Psych: Normal affect   Labs    High Sensitivity Troponin:   Recent Labs  Lab 08/11/19 1333 08/11/19 1732  TROPONINIHS 16 16      Chemistry Recent Labs  Lab 08/13/19 0513 08/14/19 0553 08/15/19 0524  NA 141 138 139  K 4.5 5.4* 4.9  CL 97* 97* 99  CO2 35* 30 31  GLUCOSE 89 84 83  BUN 36* 41* 45*  CREATININE 1.82* 1.68* 1.48*  CALCIUM 8.6* 8.4* 8.5*  GFRNONAA 30* 34* 39*  GFRAA 35* 39* 45*  ANIONGAP 9 11 9      Hematology Recent Labs  Lab 08/11/19 1333  WBC 4.7  RBC 5.00  HGB 15.6  HCT 49.3  MCV 98.6  MCH 31.2  MCHC 31.6  RDW 16.4*  PLT 129*    BNP Recent Labs  Lab 08/11/19 1333  BNP 401.6*     DDimer No results for input(s): DDIMER in the last 168 hours.   Radiology    Dg Chest 2 View  Result Date: 08/13/2019 CLINICAL DATA:  Shortness of breath.  EXAM: CHEST - 2 VIEW COMPARISON:  Radiograph of August 11, 2019. FINDINGS: Stable cardiomegaly. No pneumothorax is noted. Atherosclerosis of thoracic aorta is noted. Moderate bilateral pleural effusions are noted with associated atelectasis. Severe degenerative changes seen involving both glenohumeral joints. IMPRESSION: Moderate size bilateral pleural effusions are noted with associated atelectasis. Aortic Atherosclerosis (ICD10-I70.0). Electronically Signed   By: Marijo Conception M.D.   On: 08/13/2019 14:11    Cardiac Studies   Echocardiogram 04/01/2019: Study Conclusions: - Left ventricle: The cavity size was mildly dilated. There was mild focal basal and mild concentric hypertrophy of the septum. Systolic function was severely reduced. The estimated ejection fraction was in the range of 20% to 25%. Diffuse hypokinesis. - Aortic valve: There was mild regurgitation. Valve area (VTI): 2.17 cm^2. Valve area (Vmax):  2.28 cm^2. Valve area (Vmean): 2.11 cm^2. - Mitral valve: There was moderate regurgitation. - Left atrium: The atrium was severely dilated. - Right ventricle: The cavity size was severely dilated. Wall thickness was normal. Systolic function was moderately reduced. - Right atrium: The atrium was moderately dilated. - Pulmonary arteries: Systolic pressure was mildly increased. PA peak pressure: 37 mm Hg (S). - Pericardium, extracardiac: A trivial pericardial effusion was identified posterior to the heart. Features were not consistent with tamponade physiology. There was a left pleural effusion.  Impressions: - When compared to the prior study from 04/07/2017 LVEF has further decreased now 20-25% with diffuse hypokinesis. RVEF is moderately decreased, previously normal.  Patient Profile     85 y.o. veteran [employer] with acute on chronic systolic CHF with EF of 0000000 on Echo in 03/2018, persistent atrial fibrillation s/p multiple DCCV last in 03/2018, maintained on Amiodarone and Eliquis, questionablehistoryof CAD, hyperlipidemia, CKD stage III, depression, and anxiety.  Assessment & Plan    1. CHF:  Reasonably well compensated, but still has moderate bilateral pleural effusions.  Limited room for medical Rx due to low BP and abnormal renal function.  Reluctant to undergo thoracentesis. Restarted Oral diuretics and he is apparently stable on current regimen, with mild admit to daily diuresis. Plan a home weight based regimen of torsemide: Torsemide 10 mg daily, increase to 20 mg on days that weight is 155 lb or higher. 2. Persistent AFib:  Rate control remains adequate.   On amio for rate control (relative hypotension).  Prefer metoprolol instead of diltiazem. On Apixaban. CHADSVasc 4-5. No recent falls or bleeding. 3. Ac on CKD3:  Creatinine and potassium levels have improved [facility] will sign off.   Medication Recommendations: Torsemide 10 mg daily if weight is  154 pounds or less, 20 mg daily if weight is 150 pounds or higher, metoprolol 12.5 mg twice daily, stop diltiazem.  Continue current doses of apixaban, amiodarone, pravastatin. Other recommendations (labs, testing, etc): Basic metabolic panel in 1-2 weeks Follow up as an outpatient: We will arrange transition of care cardiology follow-up in 1-2 weeks  For questions or updates, please contact [place] Please consult [email or web address] for contact info under        Signed, Sanda Klein, MD  08/15/2019, 9:28 AM

## 2019-08-15 NOTE — Progress Notes (Signed)
[facility]                                                                              Patient Demographics  [name] [name], is a 85 y.o. male, DOB - 03-23-1920, [id number]  Admit date - 08/11/2019   Admitting Physician Lavina Hamman, MD  Outpatient Primary MD for the patient is Plotnikov, Evie Lacks, MD  Outpatient specialists:   LOS - 4  days   Medical records reviewed and are as summarized below:    Chief Complaint  Patient presents with  . Cough    x1 week (productive)        Brief summary   [name] [name] is a 85 y.o. male with Past medical history of chronic combined CHF, A. fib, CKD stage III, on chronic Eliquis. Patient presented with complaints of 2 days of cough while sleeping.  He denies having any complaints of chest pain or chest tightness.  No palpitation.  No nausea no vomiting no fever no chills.  He remains compliant with all his medications at home. For last 2 days he started having complaints of cough and he was sleeping and it will wake him up from sleep. It was progressively worsening and then he was seen by home health RN today on 08/11/2019 who referred him to emergency room. At baseline, ambulates with assistance, in ED O2 sats dropped to 85% on room air, chest x-ray showed bilateral congestion.  Assessment & Plan    Principal Problem:   Acute on chronic combined systolic and diastolic CHF, acute hypoxic respiratory failure, bilateral pleural effusions -Presented with orthopnea, PND, shortness of breath.  In ED 85% on room air requiring 2 L O2 via nasal cannula -chest x-ray showed moderate bilateral pleural effusions, compressive atelectasis bilaterally -Serial troponins unremarkable, EKG unremarkable, BNP elevated 401, had improvement in symptoms with Lasix given in ED -Cardiology was consulted.  2D echo 03/2018 had shown EF of 20 to 25% with diffuse hypokinesis - Patient is not on ACE/ARB, spironolactone at home possibly due to CKD.   Not on beta-blocker at home due to history of bradycardia.  Patient however has been taking Cardizem 30 mg twice daily. -Patient follows Dr. Percival Spanish, per last note 04/07/2019 patient was felt to be at end-of-life with his advanced age, EF of 20%.  Patient had not wanted nursing home placement or discussions with palliative care in the past. -Patient currently on torsemide.  -Ambulatory pulse ox prior to discharge -CM consult for consideration of home hospice as this may help with patient's desire not to be in the hospital.  Active Problems: Bilateral pleural effusion -Chest x-ray 9/25 had shown bilateral pleural effusions, left effusion appears to be new, right pleural effusion possibly chronic seen on previous imagings  -Will likely benefit from thoracentesis.  Discussed with the patient, called patient's son (was able to discuss with patient's daughter-in-law who is a Marine scientist)  -patient agreeable to thoracentesis prior to discharge. Order placed but will likely be performed in AM  Persistent atrial fibrillation -Status post multiple DCCV, last in 03/2018, still in A. Fib -Currently rate controlled.  Patient is on amiodarone and Cardizem  at home. -Continue low-dose Eliquis -Cardiology recommended low-dose beta-blocker for rate control, avoid Cardizem with reduced EF.  Cardizem discontinued.    Dyslipidemia -Continue Pravachol    Essential hypertension -BP currently soft, continue torsemide, beta-blocker  BPH Continue home regimen, finasteride  Chronic constipation Continue home regimen of stool softeners  Code Status: DNR DVT Prophylaxis: Apixaban Family Communication: Son on telephone   Disposition Plan: Discharge after thoracentesis  Procedures:  None  Consultants:   Cardiology  Antimicrobials:   Anti-infectives (From admission, onward)   None         Medications  Scheduled Meds: . ALPRAZolam  1 mg Oral QHS  . amiodarone  200 mg Oral Daily  . apixaban  2.5 mg  Oral BID  . finasteride  5 mg Oral QHS  . metoprolol tartrate  12.5 mg Oral BID  . mirtazapine  7.5 mg Oral QHS  . polyethylene glycol  17 g Oral QHS  . pravastatin  20 mg Oral Daily  . senna-docusate  1 tablet Oral BID  . sodium chloride flush  3 mL Intravenous Q12H  . torsemide  10 mg Oral Daily   Continuous Infusions: . sodium chloride     PRN Meds:.sodium chloride, acetaminophen, albuterol, ondansetron (ZOFRAN) IV, sodium chloride flush      Subjective:   Patient breathing better. No issues this morning.   Objective:   Vitals:   08/14/19 1338 08/14/19 2037 08/15/19 0417 08/15/19 1415  BP: 104/64 112/88 108/70 115/73  Pulse: 62 71 74 92  Resp: (!) 22 18 18 14   Temp: (!) 97.2 F (36.2 C) (!) 97.5 F (36.4 C) (!) 97.5 F (36.4 C) (!) 97.5 F (36.4 C)  TempSrc:  Oral Oral   SpO2: 100% 100% 100%   Weight:   69.5 kg   Height:        Intake/Output Summary (Last 24 hours) at 08/15/2019 1657 Last data filed at 08/15/2019 0419 Gross per 24 hour  Intake -  Output 550 ml  Net -550 ml     Wt Readings from Last 3 Encounters:  08/15/19 69.5 kg  07/07/19 73.7 kg  05/17/19 75.3 kg   General exam: Appears calm and comfortable Respiratory system: significantly diminished bilaterally. Respiratory effort normal. Cardiovascular system: S1 & S2 heard, irregular rhythm, normal rate. No murmurs, rubs, gallops or clicks. Gastrointestinal system: Abdomen is nondistended, soft and nontender. No organomegaly or masses felt. Normal bowel sounds heard. Central nervous system: Alert and oriented. No focal neurological deficits. Extremities: No calf tenderness Skin: No cyanosis. No rashes Psychiatry: Judgement and insight appear normal. Mood & affect appropriate.   Data Reviewed:  I have personally reviewed following labs and imaging studies  Micro Results Recent Results (from the past 240 hour(s))  SARS Coronavirus 2 [facility] order, Performed in [facility] hospital lab)  Nasopharyngeal Nasopharyngeal Swab     Status: None   Collection Time: 08/11/19  1:33 PM   Specimen: Nasopharyngeal Swab  Result Value Ref Range Status   SARS Coronavirus 2 NEGATIVE NEGATIVE Final    Comment: (NOTE) If result is NEGATIVE SARS-CoV-2 target nucleic acids are NOT DETECTED. The SARS-CoV-2 RNA is generally detectable in upper and lower  respiratory specimens during the acute phase of infection. The lowest  concentration of SARS-CoV-2 viral copies this assay can detect is 250  copies / mL. A negative result does not preclude SARS-CoV-2 infection  and should not be used as the sole basis for treatment or other  patient management decisions.  A  negative result may occur with  improper specimen collection / handling, submission of specimen other  than nasopharyngeal swab, presence of viral mutation(s) within the  areas targeted by this assay, and inadequate number of viral copies  (<250 copies / mL). A negative result must be combined with clinical  observations, patient history, and epidemiological information. If result is POSITIVE SARS-CoV-2 target nucleic acids are DETECTED. The SARS-CoV-2 RNA is generally detectable in upper and lower  respiratory specimens dur ing the acute phase of infection.  Positive  results are indicative of active infection with SARS-CoV-2.  Clinical  correlation with patient history and other diagnostic information is  necessary to determine patient infection status.  Positive results do  not rule out bacterial infection or co-infection with other viruses. If result is PRESUMPTIVE POSTIVE SARS-CoV-2 nucleic acids MAY BE PRESENT.   A presumptive positive result was obtained on the submitted specimen  and confirmed on repeat testing.  While 2019 novel coronavirus  (SARS-CoV-2) nucleic acids may be present in the submitted sample  additional confirmatory testing may be necessary for epidemiological  and / or clinical management purposes  to  differentiate between  SARS-CoV-2 and other Sarbecovirus currently known to infect humans.  If clinically indicated additional testing with an alternate test  methodology [phone]) is advised. The SARS-CoV-2 RNA is generally  detectable in upper and lower respiratory sp ecimens during the acute  phase of infection. The expected result is Negative. Fact Sheet for Patients:  StrictlyIdeas.no Fact Sheet for Healthcare Providers: BankingDealers.co.za This test is not yet approved or cleared by the [place] FDA and has been authorized for detection and/or diagnosis of SARS-CoV-2 by FDA under an Emergency Use Authorization (EUA).  This EUA will remain in effect (meaning this test can be used) for the duration of the COVID-19 declaration under Section 564(b)(1) of the Act, 21 U.S.C. section 360bbb-3(b)(1), unless the authorization is terminated or revoked sooner. Performed at [facility], [place] [address], [place], [place] [address]     Radiology Reports Dg Chest 2 View  Result Date: 08/13/2019 CLINICAL DATA:  Shortness of breath. EXAM: CHEST - 2 VIEW COMPARISON:  Radiograph of August 11, 2019. FINDINGS: Stable cardiomegaly. No pneumothorax is noted. Atherosclerosis of thoracic aorta is noted. Moderate bilateral pleural effusions are noted with associated atelectasis. Severe degenerative changes seen involving both glenohumeral joints. IMPRESSION: Moderate size bilateral pleural effusions are noted with associated atelectasis. Aortic Atherosclerosis (ICD10-I70.0). Electronically Signed   By: Marijo Conception M.D.   On: 08/13/2019 14:11   Dg Chest Port 1 View  Result Date: 08/11/2019 CLINICAL DATA:  Shortness of breath EXAM: PORTABLE CHEST 1 VIEW COMPARISON:  Mar 30, 2018 FINDINGS: The cardiac silhouette remains enlarged. There are aortic calcifications. There are moderate-sized bilateral pleural effusions with adjacent  compressive atelectasis. There is no pneumothorax. There are end-stage degenerative changes of the left glenohumeral joint. IMPRESSION: 1. Cardiomegaly with findings consistent with congestive heart failure. 2. Moderate bilateral pleural effusions. 3. Compressive atelectasis at the lung bases bilaterally. Electronically Signed   By: Constance Holster M.D.   On: 08/11/2019 13:17    Lab Data:  CBC: Recent Labs  Lab 08/11/19 1333  WBC 4.7  NEUTROABS 3.3  HGB 15.6  HCT 49.3  MCV 98.6  PLT [id number]*   Basic Metabolic Panel: Recent Labs  Lab 08/11/19 1333 08/12/19 0532 08/13/19 0513 08/14/19 0553 08/15/19 0524  NA 142 140 141 138 139  K 4.4 4.3 4.5 5.4* 4.9  CL 103  101 97* 97* 99  CO2 30 30 35* 30 31  GLUCOSE 124* 89 89 84 83  BUN 39* 34* 36* 41* 45*  CREATININE 1.54* 1.37* 1.82* 1.68* 1.48*  CALCIUM 8.5* 8.4* 8.6* 8.4* 8.5*   GFR: Estimated Creatinine Clearance: 27.6 mL/min (A) (by C-G formula based on SCr of 1.48 mg/dL (H)). Liver Function Tests: No results for input(s): AST, ALT, ALKPHOS, BILITOT, PROT, ALBUMIN in the last 168 hours. No results for input(s): LIPASE, AMYLASE in the last 168 hours. No results for input(s): AMMONIA in the last 168 hours. Coagulation Profile: No results for input(s): INR, PROTIME in the last 168 hours. Cardiac Enzymes: No results for input(s): CKTOTAL, CKMB, CKMBINDEX, TROPONINI in the last 168 hours. BNP (last 3 results) No results for input(s): PROBNP in the last 8760 hours. HbA1C: No results for input(s): HGBA1C in the last 72 hours. CBG: No results for input(s): GLUCAP in the last 168 hours. Lipid Profile: No results for input(s): CHOL, HDL, LDLCALC, TRIG, CHOLHDL, LDLDIRECT in the last 72 hours. Thyroid Function Tests: No results for input(s): TSH, T4TOTAL, FREET4, T3FREE, THYROIDAB in the last 72 hours. Anemia Panel: No results for input(s): VITAMINB12, FOLATE, FERRITIN, TIBC, IRON, RETICCTPCT in the last 72 hours. Urine analysis:     Component Value Date/Time   COLORURINE AMBER (A) 10/14/2018 1112   APPEARANCEUR CLOUDY (A) 10/14/2018 1112   LABSPEC 1.013 10/14/2018 1112   PHURINE 6.0 10/14/2018 1112   GLUCOSEU NEGATIVE 10/14/2018 1112   GLUCOSEU NEGATIVE 10/23/2017 1245   HGBUR MODERATE (A) 10/14/2018 1112   BILIRUBINUR NEGATIVE 10/14/2018 1112   KETONESUR NEGATIVE 10/14/2018 1112   PROTEINUR 100 (A) 10/14/2018 1112   UROBILINOGEN 0.2 10/23/2017 1245   NITRITE NEGATIVE 10/14/2018 1112   LEUKOCYTESUR LARGE (A) 10/14/2018 1112     Cordelia Poche M.D. [facility] 08/15/2019, 4:57 PM  Pager: [phone] Between 7am to 7pm - call Pager - [phone]  After 7pm go to [email or web address] - password [email or web address]  Call night coverage person covering after 7pm

## 2019-08-16 ENCOUNTER — Telehealth: Payer: Self-pay | Admitting: Cardiology

## 2019-08-16 ENCOUNTER — Inpatient Hospital Stay (HOSPITAL_COMMUNITY): Payer: Medicare Other

## 2019-08-16 LAB — BASIC METABOLIC PANEL
Anion gap: 13 (ref 5–15)
BUN: 51 mg/dL — ABNORMAL HIGH (ref 8–23)
CO2: 26 mmol/L (ref 22–32)
Calcium: 8.5 mg/dL — ABNORMAL LOW (ref 8.9–10.3)
Chloride: 100 mmol/L (ref 98–111)
Creatinine, Ser: 1.69 mg/dL — ABNORMAL HIGH (ref 0.61–1.24)
GFR calc Af Amer: 39 mL/min — ABNORMAL LOW (ref >60)
GFR calc non Af Amer: 33 mL/min — ABNORMAL LOW (ref >60)
Glucose, Bld: 80 mg/dL (ref 70–99)
Potassium: 4.7 mmol/L (ref 3.5–5.1)
Sodium: 139 mmol/L (ref 135–145)

## 2019-08-16 MED ORDER — LIDOCAINE HCL 1 % IJ SOLN
INTRAMUSCULAR | Status: AC
  Filled 2019-08-16: qty 10

## 2019-08-16 NOTE — TOC Transition Note (Signed)
Transition of Care [facility]) - CM/SW Discharge Note   Patient Details  Name: [name] [name] MRN: [id number] Date of Birth: 12-27-1919  Transition of Care [facility]) CM/SW Contact:  Dessa Phi, RN Phone Number: 08/16/2019, 4:04 PM   Clinical Narrative:   [name] active w/HHC-rep [facility] aware of d/c today. Son aware of [name] services-per son home 02 note needed. MD notified that if home 02 not needed please d/c order.    Final next level of care: Home w Home Health Services Barriers to Discharge: No Barriers Identified   Patient Goals and CMS Choice   CMS Medicare.gov Compare Post Acute Care list provided to:: Patient Represenative (must comment)(son [name]) Choice offered to / list presented to : Adult Children  Discharge Placement                       Discharge Plan and Services   Discharge Planning Services: CM Consult Post Acute Care Choice: Home Health                    HH Arranged: RN, PT, OT [facility] Agency: [place]        Social Determinants of Health (SDOH) Interventions     Readmission Risk Interventions No flowsheet data found.

## 2019-08-16 NOTE — Procedures (Signed)
Ultrasound-guided  therapeutic right thoracentesis performed yielding 1.5 liters of yellow fluid. No immediate complications. Follow-up chest x-ray pending. EBL < 1 cc. Due to pt coughing/chest discomfort only the above amount of fluid was removed today.

## 2019-08-16 NOTE — Discharge Summary (Signed)
Physician Discharge Summary  [name] [name] [id number] DOB: 08-Nov-1920 DOA: 08/11/2019  PCP: Cassandria Anger, MD  Admit date: 08/11/2019 Discharge date: 08/16/2019  Admitted From: home  Disposition: Home  Recommendations for Outpatient Follow-up:  1. Follow up with PCP in 1-2 weeks  Home Health: Resume home health Equipment/Devices: Not applicable  Discharge Condition: Stable CODE STATUS: DNR Diet recommendation: Regular diet  Brief/Interim Summary: 85 year old man with chronic combined heart failure, chronic A. fib, stage III chronic kidney disease, anticoagulated on Eliquis presented to the emergency room with cough and shortness of breath, found to be 85% on room air in the emergency room.  Patient was admitted to the hospital and treated for acute on chronic combined heart failure.  Known ejection fraction 25%.  Treated with IV diuresis and now changed to torsemide.  Currently euvolemic.  Has bilateral pleural effusion, underwent right-sided thoracentesis with 1.5 L removed. Now on room air.  Unwilling to have another procedure. Stable. Has persistent A. fib, rate controlled.  On amiodarone.  Cardizem was discontinued and started on metoprolol.  He is therapeutic on Eliquis.  Medically stable.  Wanted to go back home today.  Discharged with his son.  Has fairly terminal stages of congestive heart failure, if further decompensates will benefit with hospice at home.  Discharge Diagnoses:  Principal Problem:   Acute systolic CHF (congestive heart failure) (HCC) Active Problems:   Dyslipidemia   Essential hypertension   Coronary atherosclerosis   BPH (benign prostatic hyperplasia)   Constipation   CKD (chronic kidney disease) stage 3, GFR 30-59 ml/min (HCC)   Thrombocytopenia (CHRONIC)   Chronic anticoagulation   Chronic atrial fibrillation   Acute respiratory failure with hypoxia [facility])    Discharge Instructions  Discharge Instructions    Diet - low sodium heart  healthy   Complete by: As directed    Increase activity slowly   Complete by: As directed    Increase activity slowly   Complete by: As directed      Allergies as of 08/16/2019      Reactions   Lisinopril Cough      Medication List    STOP taking these medications   diltiazem 30 MG tablet Commonly known as: CARDIZEM   lubiprostone 24 MCG capsule Commonly known as: Amitiza   triamcinolone ointment 0.1 % Commonly known as: KENALOG     TAKE these medications   ALPRAZolam 1 MG tablet Commonly known as: XANAX TAKE ONE TABLET AT BEDTIME.   amiodarone 200 MG tablet Commonly known as: PACERONE TAKE 1 TABLET ONCE DAILY.   Eliquis 2.5 MG Tabs tablet Generic drug: apixaban TAKE 1 TABLET BY MOUTH TWICE DAILY. What changed: how much to take   finasteride 5 MG tablet Commonly known as: PROSCAR Take 5 mg by mouth at bedtime.   ICAPS PO Take 1 capsule by mouth daily.   metoprolol tartrate 25 MG tablet Commonly known as: LOPRESSOR Take 0.5 tablets (12.5 mg total) by mouth 2 (two) times daily.   mirtazapine 7.5 MG tablet Commonly known as: REMERON Take 7.5 mg by mouth at bedtime.   polyethylene glycol powder 17 GM/SCOOP powder Commonly known as: MiraLax 1 capful BID What changed:   how much to take  how to take this  when to take this  additional instructions   pravastatin 20 MG tablet Commonly known as: PRAVACHOL TAKE 1 TABLET EACH DAY. What changed: See the new instructions.   senna 8.6 MG tablet Commonly known as: SENOKOT Take 1 tablet by  mouth 2 (two) times daily.   torsemide 10 MG tablet Commonly known as: Demadex Take 1 tablet (10 mg total) by mouth daily. Take 20 mg daily for weight >155 lbs What changed: additional instructions   Ventolin HFA 108 (90 Base) MCG/ACT inhaler Generic drug: albuterol Inhale 2 puffs into the lungs every 6 (six) hours as needed for wheezing or shortness of breath.            Durable Medical Equipment  (From  admission, onward)         Start     Ordered   08/15/19 1518  For home use only DME oxygen  Once    Question Answer Comment  Length of Need Lifetime   Mode or (Route) Nasal cannula   Liters per Minute 2   Frequency Continuous (stationary and portable oxygen unit needed)   Oxygen delivery system Gas      08/15/19 1518         Follow-up Information    Erlene Quan, PA-C Follow up on 08/23/2019.   Specialties: Cardiology, Radiology Why: Please arrive 15 minutes early for your 1:45pm post-hospital cardiology follow-up appointment Contact information: [address] [place] [address] [phone]          Allergies  Allergen Reactions  . Lisinopril Cough    Consultations:  Cardiology    Procedures/Studies: Dg Chest 1 View  Result Date: 08/16/2019 CLINICAL DATA:  Status post right thoracentesis EXAM: CHEST  1 VIEW COMPARISON:  08/13/2019 FINDINGS: Small bilateral pleural effusions, left greater than right. No right pneumothorax status post thoracentesis. No left thoracentesis. Bibasilar atelectasis. Stable cardiomegaly. Severe osteoarthritis of bilateral glenohumeral joints. IMPRESSION: Small residual right pleural effusion. No right pneumothorax status post thoracentesis. Stable small left pleural effusion. Electronically Signed   By: Kathreen Devoid   On: 08/16/2019 15:16   Dg Chest 2 View  Result Date: 08/13/2019 CLINICAL DATA:  Shortness of breath. EXAM: CHEST - 2 VIEW COMPARISON:  Radiograph of August 11, 2019. FINDINGS: Stable cardiomegaly. No pneumothorax is noted. Atherosclerosis of thoracic aorta is noted. Moderate bilateral pleural effusions are noted with associated atelectasis. Severe degenerative changes seen involving both glenohumeral joints. IMPRESSION: Moderate size bilateral pleural effusions are noted with associated atelectasis. Aortic Atherosclerosis (ICD10-I70.0). Electronically Signed   By: Marijo Conception M.D.   On: 08/13/2019 14:11    Dg Chest Port 1 View  Result Date: 08/11/2019 CLINICAL DATA:  Shortness of breath EXAM: PORTABLE CHEST 1 VIEW COMPARISON:  Mar 30, 2018 FINDINGS: The cardiac silhouette remains enlarged. There are aortic calcifications. There are moderate-sized bilateral pleural effusions with adjacent compressive atelectasis. There is no pneumothorax. There are end-stage degenerative changes of the left glenohumeral joint. IMPRESSION: 1. Cardiomegaly with findings consistent with congestive heart failure. 2. Moderate bilateral pleural effusions. 3. Compressive atelectasis at the lung bases bilaterally. Electronically Signed   By: Constance Holster M.D.   On: 08/11/2019 13:17   US Thoracentesis Asp Pleural Space W/img Guide  Result Date: 08/16/2019 INDICATION: Patient with history of CHF, atrial fibrillation, chronic kidney disease, bilateral pleural effusions right greater than left. Request made for therapeutic right thoracentesis. EXAM: ULTRASOUND GUIDED THERAPEUTIC RIGHT THORACENTESIS MEDICATIONS: None COMPLICATIONS: None immediate. PROCEDURE: An ultrasound guided thoracentesis was thoroughly discussed with the patient and questions answered. The benefits, risks, alternatives and complications were also discussed. The patient understands and wishes to proceed with the procedure. Written consent was obtained. Ultrasound was performed to localize and mark an adequate pocket of fluid  in the right chest. The area was then prepped and draped in the normal sterile fashion. 1% Lidocaine was used for local anesthesia. Under ultrasound guidance a 6 Fr Safe-T-Centesis catheter was introduced. Thoracentesis was performed. The catheter was removed and a dressing applied. FINDINGS: A total of approximately 1.5 liters of yellow fluid was removed. Due to pt coughing/chest discomfort only the above amount of fluid was removed today. IMPRESSION: Successful ultrasound guided therapeutic right thoracentesis yielding 1.5 liters of  pleural fluid. Read by: Rowe Robert, PA-C Electronically Signed   By: Jerilynn Mages.  Shick M.D.   On: 08/16/2019 15:27      Subjective: Patient seen and examined.  Eager to go home.  Reevaluated after thoracentesis, on room air.   Discharge Exam: Vitals:   08/16/19 1459 08/16/19 1504  BP: (!) 86/51 (!) 126/57  Pulse:    Resp:    Temp:    SpO2:     Vitals:   08/16/19 1454 08/16/19 1458 08/16/19 1459 08/16/19 1504  BP: 92/60 (!) 95/53 (!) 86/51 (!) 126/57  Pulse:      Resp:      Temp:      TempSrc:      SpO2:      Weight:      Height:        General: Pt is alert, awake, not in acute distress, on room air Cardiovascular: RRR, S1/S2 +, no rubs, no gallops Respiratory: CTA bilaterally, no wheezing, no rhonchi Abdominal: Soft, NT, ND, bowel sounds + Extremities: no edema, no cyanosis    The results of significant diagnostics from this hospitalization (including imaging, microbiology, ancillary and laboratory) are listed below for reference.     Microbiology: Recent Results (from the past 240 hour(s))  SARS Coronavirus 2 [facility] order, Performed in [facility] hospital lab) Nasopharyngeal Nasopharyngeal Swab     Status: None   Collection Time: 08/11/19  1:33 PM   Specimen: Nasopharyngeal Swab  Result Value Ref Range Status   SARS Coronavirus 2 NEGATIVE NEGATIVE Final    Comment: (NOTE) If result is NEGATIVE SARS-CoV-2 target nucleic acids are NOT DETECTED. The SARS-CoV-2 RNA is generally detectable in upper and lower  respiratory specimens during the acute phase of infection. The lowest  concentration of SARS-CoV-2 viral copies this assay can detect is 250  copies / mL. A negative result does not preclude SARS-CoV-2 infection  and should not be used as the sole basis for treatment or other  patient management decisions.  A negative result may occur with  improper specimen collection / handling, submission of specimen other  than nasopharyngeal swab, presence of viral  mutation(s) within the  areas targeted by this assay, and inadequate number of viral copies  (<250 copies / mL). A negative result must be combined with clinical  observations, patient history, and epidemiological information. If result is POSITIVE SARS-CoV-2 target nucleic acids are DETECTED. The SARS-CoV-2 RNA is generally detectable in upper and lower  respiratory specimens dur ing the acute phase of infection.  Positive  results are indicative of active infection with SARS-CoV-2.  Clinical  correlation with patient history and other diagnostic information is  necessary to determine patient infection status.  Positive results do  not rule out bacterial infection or co-infection with other viruses. If result is PRESUMPTIVE POSTIVE SARS-CoV-2 nucleic acids MAY BE PRESENT.   A presumptive positive result was obtained on the submitted specimen  and confirmed on repeat testing.  While 2019 novel coronavirus  (SARS-CoV-2) nucleic acids may be  present in the submitted sample  additional confirmatory testing may be necessary for epidemiological  and / or clinical management purposes  to differentiate between  SARS-CoV-2 and other Sarbecovirus currently known to infect humans.  If clinically indicated additional testing with an alternate test  methodology [phone]) is advised. The SARS-CoV-2 RNA is generally  detectable in upper and lower respiratory sp ecimens during the acute  phase of infection. The expected result is Negative. Fact Sheet for Patients:  StrictlyIdeas.no Fact Sheet for Healthcare Providers: BankingDealers.co.za This test is not yet approved or cleared by the [place] FDA and has been authorized for detection and/or diagnosis of SARS-CoV-2 by FDA under an Emergency Use Authorization (EUA).  This EUA will remain in effect (meaning this test can be used) for the duration of the COVID-19 declaration under Section 564(b)(1)  of the Act, 21 U.S.C. section 360bbb-3(b)(1), unless the authorization is terminated or revoked sooner. Performed at [facility], [place] [address], [place], [place] [address]      Labs: BNP (last 3 results) Recent Labs    08/11/19 1333  BNP [id number]*   Basic Metabolic Panel: Recent Labs  Lab 08/12/19 0532 08/13/19 0513 08/14/19 0553 08/15/19 0524 08/16/19 0732  NA 140 141 138 139 139  K 4.3 4.5 5.4* 4.9 4.7  CL 101 97* 97* 99 100  CO2 30 35* 30 31 26   GLUCOSE 89 89 84 83 80  BUN 34* 36* 41* 45* 51*  CREATININE 1.37* 1.82* 1.68* 1.48* 1.69*  CALCIUM 8.4* 8.6* 8.4* 8.5* 8.5*   Liver Function Tests: No results for input(s): AST, ALT, ALKPHOS, BILITOT, PROT, ALBUMIN in the last 168 hours. No results for input(s): LIPASE, AMYLASE in the last 168 hours. No results for input(s): AMMONIA in the last 168 hours. CBC: Recent Labs  Lab 08/11/19 1333  WBC 4.7  NEUTROABS 3.3  HGB 15.6  HCT 49.3  MCV 98.6  PLT 129*   Cardiac Enzymes: No results for input(s): CKTOTAL, CKMB, CKMBINDEX, TROPONINI in the last 168 hours. BNP: Invalid input(s): POCBNP CBG: No results for input(s): GLUCAP in the last 168 hours. D-Dimer No results for input(s): DDIMER in the last 72 hours. Hgb A1c No results for input(s): HGBA1C in the last 72 hours. Lipid Profile No results for input(s): CHOL, HDL, LDLCALC, TRIG, CHOLHDL, LDLDIRECT in the last 72 hours. Thyroid function studies No results for input(s): TSH, T4TOTAL, T3FREE, THYROIDAB in the last 72 hours.  Invalid input(s): FREET3 Anemia work up No results for input(s): VITAMINB12, FOLATE, FERRITIN, TIBC, IRON, RETICCTPCT in the last 72 hours. Urinalysis    Component Value Date/Time   COLORURINE AMBER (A) 10/14/2018 1112   APPEARANCEUR CLOUDY (A) 10/14/2018 1112   LABSPEC 1.013 10/14/2018 1112   PHURINE 6.0 10/14/2018 1112   GLUCOSEU NEGATIVE 10/14/2018 1112   GLUCOSEU NEGATIVE 10/23/2017 1245   HGBUR MODERATE (A)  10/14/2018 1112   BILIRUBINUR NEGATIVE 10/14/2018 1112   KETONESUR NEGATIVE 10/14/2018 1112   PROTEINUR 100 (A) 10/14/2018 1112   UROBILINOGEN 0.2 10/23/2017 1245   NITRITE NEGATIVE 10/14/2018 1112   LEUKOCYTESUR LARGE (A) 10/14/2018 1112   Sepsis Labs Invalid input(s): PROCALCITONIN,  WBC,  LACTICIDVEN Microbiology Recent Results (from the past 240 hour(s))  SARS Coronavirus 2 [facility] order, Performed in [facility] hospital lab) Nasopharyngeal Nasopharyngeal Swab     Status: None   Collection Time: 08/11/19  1:33 PM   Specimen: Nasopharyngeal Swab  Result Value Ref Range Status   SARS Coronavirus 2 NEGATIVE NEGATIVE Final  Comment: (NOTE) If result is NEGATIVE SARS-CoV-2 target nucleic acids are NOT DETECTED. The SARS-CoV-2 RNA is generally detectable in upper and lower  respiratory specimens during the acute phase of infection. The lowest  concentration of SARS-CoV-2 viral copies this assay can detect is 250  copies / mL. A negative result does not preclude SARS-CoV-2 infection  and should not be used as the sole basis for treatment or other  patient management decisions.  A negative result may occur with  improper specimen collection / handling, submission of specimen other  than nasopharyngeal swab, presence of viral mutation(s) within the  areas targeted by this assay, and inadequate number of viral copies  (<250 copies / mL). A negative result must be combined with clinical  observations, patient history, and epidemiological information. If result is POSITIVE SARS-CoV-2 target nucleic acids are DETECTED. The SARS-CoV-2 RNA is generally detectable in upper and lower  respiratory specimens dur ing the acute phase of infection.  Positive  results are indicative of active infection with SARS-CoV-2.  Clinical  correlation with patient history and other diagnostic information is  necessary to determine patient infection status.  Positive results do  not rule out bacterial  infection or co-infection with other viruses. If result is PRESUMPTIVE POSTIVE SARS-CoV-2 nucleic acids MAY BE PRESENT.   A presumptive positive result was obtained on the submitted specimen  and confirmed on repeat testing.  While 2019 novel coronavirus  (SARS-CoV-2) nucleic acids may be present in the submitted sample  additional confirmatory testing may be necessary for epidemiological  and / or clinical management purposes  to differentiate between  SARS-CoV-2 and other Sarbecovirus currently known to infect humans.  If clinically indicated additional testing with an alternate test  methodology [phone]) is advised. The SARS-CoV-2 RNA is generally  detectable in upper and lower respiratory sp ecimens during the acute  phase of infection. The expected result is Negative. Fact Sheet for Patients:  StrictlyIdeas.no Fact Sheet for Healthcare Providers: BankingDealers.co.za This test is not yet approved or cleared by the [place] FDA and has been authorized for detection and/or diagnosis of SARS-CoV-2 by FDA under an Emergency Use Authorization (EUA).  This EUA will remain in effect (meaning this test can be used) for the duration of the COVID-19 declaration under Section 564(b)(1) of the Act, 21 U.S.C. section 360bbb-3(b)(1), unless the authorization is terminated or revoked sooner. Performed at [facility], [place] [address], [place], [place] [address]      Time coordinating discharge: 35 minutes  SIGNED:   Barb Merino, MD  [facility] 08/16/2019, 3:51 PM

## 2019-08-16 NOTE — Progress Notes (Signed)
PT Cancellation Note  Patient Details Name: [name] [name] MRN: [id number] DOB: 1920-05-31   Cancelled Treatment:     Pt out of room at a procedure.  US THORACENTESIS ASP PLEURAL SPACE W/IMG GUIDE  Pt has been evaluated with rec for [facility].      Rica Koyanagi  PTA Acute  Rehabilitation Services Pager      [phone] Office      [phone]

## 2019-08-16 NOTE — Progress Notes (Signed)
OT Cancellation Note  Patient Details Name: [name] [name] MRN: [id number] DOB: August 03, 1920   Cancelled Treatment:    Reason Eval/Treat Not Completed: Patient at procedure or test/ unavailable  [name], Mickel Baas, OT Acute Rehabilitation Services [phone] [phone]    08/16/2019, 3:36 PM

## 2019-08-16 NOTE — Progress Notes (Signed)
PROGRESS NOTE    [name] [name]  [id number] DOB: 10/12/1920 DOA: 08/11/2019 PCP: Cassandria Anger, MD    Brief Narrative:  Patient is 85 year old male with past medical history of chronic combined congestive heart failure, A. fib, CKD stage III on Eliquis.  Patient presented with complaints of cough for 2 days.  In the emergency room his oxygen saturation dropped to 85% on room air.  Chest x-ray showed bilateral congestion.  He was admitted for CHF exacerbation.   Assessment & Plan:   Principal Problem:   Acute systolic CHF (congestive heart failure) (HCC) Active Problems:   Dyslipidemia   Essential hypertension   Coronary atherosclerosis   BPH (benign prostatic hyperplasia)   Constipation   CKD (chronic kidney disease) stage 3, GFR 30-59 ml/min (HCC)   Thrombocytopenia (CHRONIC)   Chronic anticoagulation   Chronic atrial fibrillation   Acute respiratory failure with hypoxia (HCC)  Acute on chronic combined systolic and diastolic congestive heart failure, acute hypoxic respiratory failure with bilateral pleural effusions: Presented with orthopnea, PND and shortness of breath.  Hypoxic 85% on room air. 2D echocardiogram with known ejection fraction of 20-25%. Treated with IV diuresis, now changed to torsemide.  Compensated today. Does have bilateral pleural effusion, for therapeutic thoracentesis. Keep on oxygen to keep saturations more than 90%.  Will likely be able to weaned off after thoracentesis.  Persistent A. fib: Rate controlled.  On amiodarone and metoprolol.  Cardizem discontinued.  Therapeutic on Eliquis.  Hyperlipidemia: On Pravachol that he will continue.  Patient going for thoracentesis.  Wean off to room oxygen when able.  DVT prophylaxis: Therapeutic on Eliquis Code Status: DNR Family Communication: None Disposition Plan: Back to assisted living facility.  Anticipate tomorrow.   Consultants:   Cardiology  Procedures:   None  Antimicrobials:    None   Subjective: Patient seen and examined.  He was frustrated to be in the hospital.  He is agreeable for thoracentesis now.  No other overnight events.  Objective: Vitals:   08/15/19 2020 08/16/19 0500 08/16/19 0529 08/16/19 1230  BP: 102/72  121/87 94/67  Pulse: 91  95 73  Resp: 18  18 18   Temp: 97.7 F (36.5 C)  97.8 F (36.6 C) (!) 97 F (36.1 C)  TempSrc: Oral  Oral   SpO2: 99%  98% 93%  Weight:  69.1 kg    Height:        Intake/Output Summary (Last 24 hours) at 08/16/2019 1443 Last data filed at 08/16/2019 1230 Gross per 24 hour  Intake 30 ml  Output 275 ml  Net -245 ml   Filed Weights   08/13/19 0541 08/15/19 0417 08/16/19 0500  Weight: 69.4 kg 69.5 kg 69.1 kg    Examination:  General exam: Appears calm and comfortable, on 2 L oxygen. Respiratory system: Clear to auscultation. Respiratory effort normal.  Poor air entry at both bases.  No other added sounds. Cardiovascular system: S1 & S2 heard, irregularly irregular.  No JVD, murmurs, rubs, gallops or clicks. No pedal edema. Gastrointestinal system: Abdomen is nondistended, soft and nontender. No organomegaly or masses felt. Normal bowel sounds heard. Central nervous system: Alert and oriented. No focal neurological deficits. Extremities: Symmetric 5 x 5 power. Skin: No rashes, lesions or ulcers Psychiatry: Judgement and insight appear normal. Mood & affect appropriate.     Data Reviewed: I have personally reviewed following labs and imaging studies  CBC: Recent Labs  Lab 08/11/19 1333  WBC 4.7  NEUTROABS 3.3  HGB 15.6  HCT 49.3  MCV 98.6  PLT [id number]   Basic Metabolic Panel: Recent Labs  Lab 08/12/19 0532 08/13/19 0513 08/14/19 0553 08/15/19 0524 08/16/19 0732  NA 140 141 138 139 139  K 4.3 4.5 5.4* 4.9 4.7  CL 101 97* 97* 99 100  CO2 30 35* 30 31 26   GLUCOSE 89 89 84 83 80  BUN 34* 36* 41* 45* 51*  CREATININE 1.37* 1.82* 1.68* 1.48* 1.69*  CALCIUM 8.4* 8.6* 8.4* 8.5* 8.5*   GFR:  Estimated Creatinine Clearance: 24.2 mL/min (A) (by C-G formula based on SCr of 1.69 mg/dL (H)). Liver Function Tests: No results for input(s): AST, ALT, ALKPHOS, BILITOT, PROT, ALBUMIN in the last 168 hours. No results for input(s): LIPASE, AMYLASE in the last 168 hours. No results for input(s): AMMONIA in the last 168 hours. Coagulation Profile: No results for input(s): INR, PROTIME in the last 168 hours. Cardiac Enzymes: No results for input(s): CKTOTAL, CKMB, CKMBINDEX, TROPONINI in the last 168 hours. BNP (last 3 results) No results for input(s): PROBNP in the last 8760 hours. HbA1C: No results for input(s): HGBA1C in the last 72 hours. CBG: No results for input(s): GLUCAP in the last 168 hours. Lipid Profile: No results for input(s): CHOL, HDL, LDLCALC, TRIG, CHOLHDL, LDLDIRECT in the last 72 hours. Thyroid Function Tests: No results for input(s): TSH, T4TOTAL, FREET4, T3FREE, THYROIDAB in the last 72 hours. Anemia Panel: No results for input(s): VITAMINB12, FOLATE, FERRITIN, TIBC, IRON, RETICCTPCT in the last 72 hours. Sepsis Labs: No results for input(s): PROCALCITON, LATICACIDVEN in the last 168 hours.  Recent Results (from the past 240 hour(s))  SARS Coronavirus 2 [facility] order, Performed in [facility] hospital lab) Nasopharyngeal Nasopharyngeal Swab     Status: None   Collection Time: 08/11/19  1:33 PM   Specimen: Nasopharyngeal Swab  Result Value Ref Range Status   SARS Coronavirus 2 NEGATIVE NEGATIVE Final    Comment: (NOTE) If result is NEGATIVE SARS-CoV-2 target nucleic acids are NOT DETECTED. The SARS-CoV-2 RNA is generally detectable in upper and lower  respiratory specimens during the acute phase of infection. The lowest  concentration of SARS-CoV-2 viral copies this assay can detect is 250  copies / mL. A negative result does not preclude SARS-CoV-2 infection  and should not be used as the sole basis for treatment or other  patient management decisions.  A  negative result may occur with  improper specimen collection / handling, submission of specimen other  than nasopharyngeal swab, presence of viral mutation(s) within the  areas targeted by this assay, and inadequate number of viral copies  (<250 copies / mL). A negative result must be combined with clinical  observations, patient history, and epidemiological information. If result is POSITIVE SARS-CoV-2 target nucleic acids are DETECTED. The SARS-CoV-2 RNA is generally detectable in upper and lower  respiratory specimens dur ing the acute phase of infection.  Positive  results are indicative of active infection with SARS-CoV-2.  Clinical  correlation with patient history and other diagnostic information is  necessary to determine patient infection status.  Positive results do  not rule out bacterial infection or co-infection with other viruses. If result is PRESUMPTIVE POSTIVE SARS-CoV-2 nucleic acids MAY BE PRESENT.   A presumptive positive result was obtained on the submitted specimen  and confirmed on repeat testing.  While 2019 novel coronavirus  (SARS-CoV-2) nucleic acids may be present in the submitted sample  additional confirmatory testing may be necessary for epidemiological  and / or clinical management  purposes  to differentiate between  SARS-CoV-2 and other Sarbecovirus currently known to infect humans.  If clinically indicated additional testing with an alternate test  methodology [phone]) is advised. The SARS-CoV-2 RNA is generally  detectable in upper and lower respiratory sp ecimens during the acute  phase of infection. The expected result is Negative. Fact Sheet for Patients:  StrictlyIdeas.no Fact Sheet for Healthcare Providers: BankingDealers.co.za This test is not yet approved or cleared by the [place] FDA and has been authorized for detection and/or diagnosis of SARS-CoV-2 by FDA under an Emergency Use  Authorization (EUA).  This EUA will remain in effect (meaning this test can be used) for the duration of the COVID-19 declaration under Section 564(b)(1) of the Act, 21 U.S.C. section 360bbb-3(b)(1), unless the authorization is terminated or revoked sooner. Performed at [facility], [place] [address], [place], [place] [address]          Radiology Studies: No results found.      Scheduled Meds: . ALPRAZolam  1 mg Oral QHS  . amiodarone  200 mg Oral Daily  . apixaban  2.5 mg Oral BID  . finasteride  5 mg Oral QHS  . lidocaine      . metoprolol tartrate  12.5 mg Oral BID  . mirtazapine  7.5 mg Oral QHS  . polyethylene glycol  17 g Oral QHS  . pravastatin  20 mg Oral Daily  . senna-docusate  1 tablet Oral BID  . sodium chloride flush  3 mL Intravenous Q12H  . torsemide  10 mg Oral Daily   Continuous Infusions: . sodium chloride       LOS: 5 days    Time spent: 25 minutes    Barb Merino, MD [facility] Pager [phone]  If 7PM-7AM, please contact night-coverage [email or web address] Password [facility] 08/16/2019, 2:43 PM

## 2019-08-16 NOTE — Progress Notes (Signed)
RN reviewed discharge instructions with patient.   All questions answered.   Paperwork given to patient. Prescriptions sent to pharmacy by MD.

## 2019-08-16 NOTE — Telephone Encounter (Signed)
Patient currently admitted

## 2019-08-16 NOTE — Procedures (Signed)
Ultrasound-guided therapeutic right thoracentesis performed yielding 1.5 liters of yellow fluid. No immediate complications. Follow-up chest x-ray pending. EBL < 1cc. Due to pt coughing/chest discomfort only the above amount of fluid was removed today.

## 2019-08-16 NOTE — Telephone Encounter (Signed)
New Message  Per Roby Lofts PA, scheduled [facility] visit with Kerin Ransom PA on 08/23/19 at 1:45 pm.

## 2019-08-16 NOTE — Progress Notes (Signed)
Called and updated son, Clair Gulling about thoracentesis procedure.  All questions answered.   Will continue to monitor patient, and inform family of updates/changes.   SWhittemore, Therapist, sports

## 2019-08-17 ENCOUNTER — Telehealth: Payer: Self-pay | Admitting: *Deleted

## 2019-08-17 NOTE — Telephone Encounter (Signed)
Called patient, tried to go over questions for [facility] call-  Patient states "caught him at a bad time to call back" and hung up. Will try again at another time.

## 2019-08-17 NOTE — Telephone Encounter (Signed)
Pt was on TCM report admitted 08/11/19 to the hospital and treated for acute on chronic combined heart failure. Pt was having cough and shortness of breath sxs, he was found to be 85% on room air. Pt was treated with IV diuresis and now changed to torsemide.  Currently euvolemic.  Has bilateral pleural effusion, underwent right-sided thoracentesis with 1.5 L removed. Now on room air. Pt D/C 08/16/19, and will follow-up w/cardiology 08/20/19.Marland KitchenJohny Chess

## 2019-08-18 DIAGNOSIS — M50323 Other cervical disc degeneration at C6-C7 level: Secondary | ICD-10-CM | POA: Diagnosis not present

## 2019-08-18 DIAGNOSIS — I4891 Unspecified atrial fibrillation: Secondary | ICD-10-CM | POA: Diagnosis not present

## 2019-08-18 DIAGNOSIS — M4322 Fusion of spine, cervical region: Secondary | ICD-10-CM | POA: Diagnosis not present

## 2019-08-18 DIAGNOSIS — I110 Hypertensive heart disease with heart failure: Secondary | ICD-10-CM | POA: Diagnosis not present

## 2019-08-18 DIAGNOSIS — M50322 Other cervical disc degeneration at C5-C6 level: Secondary | ICD-10-CM | POA: Diagnosis not present

## 2019-08-18 DIAGNOSIS — F5101 Primary insomnia: Secondary | ICD-10-CM | POA: Diagnosis not present

## 2019-08-18 DIAGNOSIS — H5461 Unqualified visual loss, right eye, normal vision left eye: Secondary | ICD-10-CM | POA: Diagnosis not present

## 2019-08-18 DIAGNOSIS — I5042 Chronic combined systolic (congestive) and diastolic (congestive) heart failure: Secondary | ICD-10-CM | POA: Diagnosis not present

## 2019-08-18 DIAGNOSIS — E559 Vitamin D deficiency, unspecified: Secondary | ICD-10-CM | POA: Diagnosis not present

## 2019-08-18 DIAGNOSIS — F419 Anxiety disorder, unspecified: Secondary | ICD-10-CM | POA: Diagnosis not present

## 2019-08-18 DIAGNOSIS — M5134 Other intervertebral disc degeneration, thoracic region: Secondary | ICD-10-CM | POA: Diagnosis not present

## 2019-08-18 DIAGNOSIS — R296 Repeated falls: Secondary | ICD-10-CM | POA: Diagnosis not present

## 2019-08-18 DIAGNOSIS — I2510 Atherosclerotic heart disease of native coronary artery without angina pectoris: Secondary | ICD-10-CM | POA: Diagnosis not present

## 2019-08-18 DIAGNOSIS — R627 Adult failure to thrive: Secondary | ICD-10-CM | POA: Diagnosis not present

## 2019-08-18 DIAGNOSIS — R202 Paresthesia of skin: Secondary | ICD-10-CM | POA: Diagnosis not present

## 2019-08-18 DIAGNOSIS — I700 Atherosclerosis of aorta: Secondary | ICD-10-CM | POA: Diagnosis not present

## 2019-08-19 ENCOUNTER — Telehealth: Payer: Self-pay | Admitting: Internal Medicine

## 2019-08-19 NOTE — Telephone Encounter (Signed)
Patient contacted regarding discharge from [facility] on 9/30.  Patient understands to follow up with provider Kerin Ransom, PA on 08/23/19 at 1:45 pm at [facility]. Patient understands discharge instructions? yes Patient understands medications and regiment? yes Patient understands to bring all medications to this visit? yes

## 2019-08-19 NOTE — Telephone Encounter (Signed)
[name] [name], from [facility], called stating she had a visit with the pt today and she has placed a referral for pt to be put in hospice. [name] [name] states his son is not active in his care. [name] [name] states he is very frail, he's weak, his BP is low, with no support in the home.Pt agreed to a hospice referral. Please advise.   [phone]

## 2019-08-21 NOTE — Telephone Encounter (Signed)
Noted! Thank you

## 2019-08-23 ENCOUNTER — Other Ambulatory Visit: Payer: Self-pay

## 2019-08-23 ENCOUNTER — Encounter: Payer: Self-pay | Admitting: Cardiology

## 2019-08-23 ENCOUNTER — Ambulatory Visit (INDEPENDENT_AMBULATORY_CARE_PROVIDER_SITE_OTHER): Payer: Medicare Other | Admitting: Cardiology

## 2019-08-23 DIAGNOSIS — R627 Adult failure to thrive: Secondary | ICD-10-CM

## 2019-08-23 DIAGNOSIS — I5042 Chronic combined systolic (congestive) and diastolic (congestive) heart failure: Secondary | ICD-10-CM

## 2019-08-23 DIAGNOSIS — N1832 Chronic kidney disease, stage 3b: Secondary | ICD-10-CM | POA: Diagnosis not present

## 2019-08-23 DIAGNOSIS — I420 Dilated cardiomyopathy: Secondary | ICD-10-CM

## 2019-08-23 NOTE — Assessment & Plan Note (Signed)
EF 20% by TEE May 2019

## 2019-08-23 NOTE — Assessment & Plan Note (Signed)
EF 20% by echo May 2019- pt is felt to be end stage and we feel he is a good candidate for Hospice.

## 2019-08-23 NOTE — Progress Notes (Signed)
Cardiology Office Note:    Date:  08/23/2019   ID:  [name] [name], DOB 02/20/1920, MRN [id number]  PCP:  Cassandria Anger, MD  Cardiologist:  Minus Breeding, MD  Electrophysiologist:  None   Referring MD: Cassandria Anger, MD   No chief complaint on file. post hospital follow up  History of Present Illness:    [name] [name] is a 85 y.o. 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 He has been followed by cardiology with a hx of AF and CHF.  He has multiple cardioversions and after his admission in May 2019 he has remained in AF with CVR on Amiodarone, low dose Diltiazem, and low dose Eliquis.  His weight in May 2019 was 190 lbs when he was admitted in CHF.  He was discharged at 173 lbs.  When I last saw him in the office he was 162 lbs.    He was recently admitted to [facility] 08/16/2019 with SOB.  He had CHF and effusions on CXR and a BNP of 401.  He was diuresed to 155 lbs and discharged.  Diltiazem was stopped during his recent hospitalization secondary to possible exacerbation of his cardiomyopathy.  Low dose Lopressor added.  He presented today for follow up.  From a CHF standpoint he appears stable.  He has clearly declined physically but his mental status is still good. He is now considering Hospice.  This has been suggested to him in the past but he declined.   Past Medical History:  Diagnosis Date  . Anxiety   . Arthritis    "shoulders" (05/28/2016)  . Cardiomyopathy   . Coronary artery disease   . Depression    hx (05/28/2016)  . Hemorrhoids   . Hyperlipidemia   . Hypertension   . Paroxysmal atrial fibrillation (HCC)   . Skipped heart beats     Past Surgical History:  Procedure Laterality Date  . APPENDECTOMY    . CARDIOVERSION N/A 06/04/2016   Procedure: CARDIOVERSION;  Surgeon: Pixie Casino, MD;  Location: [facility] ENDOSCOPY;  Service: Cardiovascular;  Laterality: N/A;  . CARDIOVERSION N/A 04/09/2017   Procedure: CARDIOVERSION;  Surgeon: Thayer Headings, MD;  Location:  [facility] ENDOSCOPY;  Service: Cardiovascular;  Laterality: N/A;  . CARDIOVERSION N/A 04/01/2018   Procedure: CARDIOVERSION;  Surgeon: Skeet Latch, MD;  Location: [facility];  Service: Cardiovascular;  Laterality: N/A;  . CATARACT EXTRACTION Left   . CHOLECYSTECTOMY OPEN    . COLONOSCOPY W/ BIOPSIES AND POLYPECTOMY    . FLEXIBLE SIGMOIDOSCOPY N/A 01/01/2019   Procedure: FLEXIBLE SIGMOIDOSCOPY;  Surgeon: Jerene Bears, MD;  Location: [facility] ENDOSCOPY;  Service: Gastroenterology;  Laterality: N/A;  . INGUINAL HERNIA REPAIR Bilateral   . MYRINGOTOMY WITH TUBE PLACEMENT Bilateral   . skin cancer removal Right    side of nose by Rt eye  . TEE WITHOUT CARDIOVERSION N/A 06/04/2016   Procedure: TRANSESOPHAGEAL ECHOCARDIOGRAM (TEE);  Surgeon: Pixie Casino, MD;  Location[facility] ENDOSCOPY;  Service: Cardiovascular;  Laterality: N/A;  . TEE WITHOUT CARDIOVERSION N/A 04/01/2018   Procedure: TRANSESOPHAGEAL ECHOCARDIOGRAM (TEE);  Surgeon: Skeet Latch, MD;  Location: [place];  Service: Cardiovascular;  Laterality: N/A;  . TONSILLECTOMY AND ADENOIDECTOMY      Current Medications: Current Meds  Medication Sig  . ALPRAZolam (XANAX) 1 MG tablet TAKE ONE TABLET AT BEDTIME. (Patient taking differently: Take 1 mg by mouth at bedtime. )  . amiodarone (PACERONE) 200 MG tablet TAKE 1 TABLET ONCE DAILY. (Patient taking differently: Take 200 mg  by mouth daily. )  . ELIQUIS 2.5 MG TABS tablet TAKE 1 TABLET BY MOUTH TWICE DAILY. (Patient taking differently: Take 2.5 mg by mouth 2 (two) times daily. )  . finasteride (PROSCAR) 5 MG tablet Take 5 mg by mouth at bedtime.  . metoprolol tartrate (LOPRESSOR) 25 MG tablet Take 0.5 tablets (12.5 mg total) by mouth 2 (two) times daily.  . mirtazapine (REMERON) 7.5 MG tablet Take 7.5 mg by mouth at bedtime.  . Multiple Vitamins-Minerals (ICAPS PO) Take 1 capsule by mouth daily.  . polyethylene glycol powder (MIRALAX) powder 1 capful BID (Patient taking differently: Take 17  g by mouth daily. )  . pravastatin (PRAVACHOL) 20 MG tablet TAKE 1 TABLET EACH DAY. (Patient taking differently: Take 20 mg by mouth daily. )  . senna (SENOKOT) 8.6 MG tablet Take 1 tablet by mouth 2 (two) times daily.  Marland Kitchen torsemide (DEMADEX) 10 MG tablet Take 1 tablet (10 mg total) by mouth daily. Take 20 mg daily for weight >155 lbs  . VENTOLIN HFA 108 (90 Base) MCG/ACT inhaler Inhale 2 puffs into the lungs every 6 (six) hours as needed for wheezing or shortness of breath.      Allergies:   Lisinopril   Social History   Socioeconomic History  . Marital status: Widowed    Spouse name: Not on file  . Number of children: 1  . Years of education: Not on file  . Highest education level: Not on file  Occupational History  . Occupation: Retired  Scientific laboratory technician  . Financial resource strain: Not on file  . Food insecurity    Worry: Not on file    Inability: Not on file  . Transportation needs    Medical: Not on file    Non-medical: Not on file  Tobacco Use  . Smoking status: Never Smoker  . Smokeless tobacco: Never Used  Substance and Sexual Activity  . Alcohol use: No  . Drug use: No  . Sexual activity: Yes  Lifestyle  . Physical activity    Days per week: Not on file    Minutes per session: Not on file  . Stress: Not on file  Relationships  . Social Herbalist on phone: Not on file    Gets together: Not on file    Attends religious service: Not on file    Active member of club or organization: Not on file    Attends meetings of clubs or organizations: Not on file    Relationship status: Not on file  Other Topics Concern  . Not on file  Social History Narrative  . Not on file     Family History: The patient's family history includes Heart disease in his brother.  ROS:   Please see the history of present illness.     All other systems reviewed and are negative.  EKGs/Labs/Other Studies Reviewed:     Recent Labs: 12/08/2018: ALT 7 08/11/2019: B  Natriuretic Peptide 401.6; Hemoglobin 15.6; Platelets 129 08/16/2019: BUN 51; Creatinine, Ser 1.69; Potassium 4.7; Sodium 139  Recent Lipid Panel    Component Value Date/Time   CHOL 112 03/30/2018 1749   TRIG 80 03/30/2018 1749   HDL 39 (L) 03/30/2018 1749   CHOLHDL 2.9 03/30/2018 1749   VLDL 16 03/30/2018 1749   LDLCALC 57 03/30/2018 1749    Physical Exam:    VS:  BP 96/64   Pulse (!) 50   Temp (!) 97.2 F (36.2 C) (Temporal)  Ht 5\' 7"  (1.702 m)   Wt 154 lb (69.9 kg)   SpO2 99%   BMI 24.12 kg/m     Wt Readings from Last 3 Encounters:  08/23/19 154 lb (69.9 kg)  08/16/19 152 lb 5.4 oz (69.1 kg)  07/07/19 162 lb 8 oz (73.7 kg)     GEN: Elderly thin Caucasian male in no acute distress HEENT: Normal NECK: No JVD; No carotid bruits LYMPHATICS: No lymphadenopathy CARDIAC: irregularly irregular, no murmurs, rubs, gallops RESPIRATORY:  Decreased at bases Lt > Rt MUSCULOSKELETAL:  No edema; No deformity  SKIN: Warm and dry NEUROLOGIC:  Alert and oriented x 3 PSYCHIATRIC:  Normal affect   ASSESSMENT:    Chronic combined systolic and diastolic CHF (congestive heart failure) (HCC) EF 20% by echo May 2019- pt is felt to be end stage and we feel he is a good candidate for Hospice.  CKD (chronic kidney disease) stage 3, GFR 30-59 ml/min GFR 33, SCr 1.69 p/28/2020  Congestive dilated cardiomyopathy (HCC) EF 20% by TEE May 2019  FTT (failure to thrive) in adult He is now considering Hospice Care  PLAN:    I had a long discussion with Mr Dinardi today where I explained [place] and encouraged him to consider this.  Unfortunately he had a falling out with his son during this recent admission and says he has to "make this decision on my own".   No labs today or medications changes.  His heart rate was recorded at 50 but was in the 80's on exam.  F/U with Dr Percival Spanish 3 months.   Medication Adjustments/Labs and Tests Ordered: Current medicines are reviewed at length with  the patient today.  Concerns regarding medicines are outlined above.  No orders of the defined types were placed in this encounter.  No orders of the defined types were placed in this encounter.   Patient Instructions  Medication Instructions:  Your physician recommends that you continue on your current medications as directed. Please refer to the Current Medication list given to you today. If you need a refill on your cardiac medications before your next appointment, please call your pharmacy.   Lab work: None  If you have labs (blood work) drawn today and your tests are completely normal, you will receive your results only by: Marland Kitchen MyChart Message (if you have MyChart) OR . A paper copy in the mail If you have any lab test that is abnormal or we need to change your treatment, we will call you to review the results.  Testing/Procedures: None   Follow-Up: At [facility], you and your health needs are our priority.  As part of our continuing mission to provide you with exceptional heart care, we have created designated Provider Care Teams.  These Care Teams include your primary Cardiologist (physician) and Advanced Practice Providers (APPs -  Physician Assistants and Nurse Practitioners) who all work together to provide you with the care you need, when you need it. You will need a follow up appointment in 3 months.  Please call our office 2 months in advance to schedule this appointment.  You may see Minus Breeding, MD or one of the following Advanced Practice Providers on your designated Care Team:   Rosaria Ferries, PA-C . Jory Sims, DNP, ANP  Any Other Special Instructions Will Be Listed Below (If Applicable).      Signed, Kerin Ransom, PA-C  08/23/2019 2:37 PM    [facility]

## 2019-08-23 NOTE — Patient Instructions (Signed)
Medication Instructions:  Your physician recommends that you continue on your current medications as directed. Please refer to the Current Medication list given to you today. If you need a refill on your cardiac medications before your next appointment, please call your pharmacy.   Lab work: None  If you have labs (blood work) drawn today and your tests are completely normal, you will receive your results only by: . MyChart Message (if you have MyChart) OR . A paper copy in the mail If you have any lab test that is abnormal or we need to change your treatment, we will call you to review the results.  Testing/Procedures: None   Follow-Up: At [facility], you and your health needs are our priority.  As part of our continuing mission to provide you with exceptional heart care, we have created designated Provider Care Teams.  These Care Teams include your primary Cardiologist (physician) and Advanced Practice Providers (APPs -  Physician Assistants and Nurse Practitioners) who all work together to provide you with the care you need, when you need it. You will need a follow up appointment in 3 months.  Please call our office 2 months in advance to schedule this appointment.  You may see James Hochrein, MD or one of the following Advanced Practice Providers on your designated Care Team:   Rhonda Barrett, PA-C . Kathryn Lawrence, DNP, ANP  Any Other Special Instructions Will Be Listed Below (If Applicable).   

## 2019-08-23 NOTE — Assessment & Plan Note (Signed)
GFR 33, SCr 1.69 p/28/2020

## 2019-08-23 NOTE — Assessment & Plan Note (Signed)
He is now considering Hospice Care

## 2019-08-24 ENCOUNTER — Telehealth: Payer: Self-pay | Admitting: Internal Medicine

## 2019-08-24 ENCOUNTER — Ambulatory Visit (INDEPENDENT_AMBULATORY_CARE_PROVIDER_SITE_OTHER): Payer: Medicare Other | Admitting: Internal Medicine

## 2019-08-24 ENCOUNTER — Encounter: Payer: Self-pay | Admitting: Internal Medicine

## 2019-08-24 VITALS — BP 116/72 | HR 82 | Temp 97.4°F | Ht 67.0 in | Wt 154.0 lb

## 2019-08-24 DIAGNOSIS — I5042 Chronic combined systolic (congestive) and diastolic (congestive) heart failure: Secondary | ICD-10-CM | POA: Diagnosis not present

## 2019-08-24 DIAGNOSIS — R627 Adult failure to thrive: Secondary | ICD-10-CM

## 2019-08-24 DIAGNOSIS — Z789 Other specified health status: Secondary | ICD-10-CM

## 2019-08-24 DIAGNOSIS — I5022 Chronic systolic (congestive) heart failure: Secondary | ICD-10-CM

## 2019-08-24 NOTE — Telephone Encounter (Signed)
'  Langley Gauss' RN from [facility] calling.  States pt has appt this afternoon with Dr. Alain Marion; wanted him to know pt's HR "Irregular even at rest."   States HR 90-120 with activity. States rested briefly , HR 104-120;  after resting 20 minutes 90-104.   Reports BP 128/78. States O2 sat 98% on RA, BLE edema "Much better at 1+." Pt denies any CP, states SOB at baseline. Also states pt has agreed to [facility].  Assured [place] would route to practice for Dr. Judeen Hammans review prior to pt's appt.

## 2019-08-24 NOTE — Patient Instructions (Signed)
Torsemide: take 10 mg tablet every day. If your weight is >155 lbs - take 20 mg.

## 2019-08-24 NOTE — Telephone Encounter (Signed)
Will discuss at OV

## 2019-08-24 NOTE — Assessment & Plan Note (Signed)
Torsemide: take 10 mg tablet every day. If your weight is >155 lbs - take 20 mg.

## 2019-08-24 NOTE — Telephone Encounter (Signed)
Error

## 2019-08-24 NOTE — Assessment & Plan Note (Signed)
[facility] confirmed

## 2019-08-24 NOTE — Assessment & Plan Note (Signed)
Hospice referral [facility] confirmed

## 2019-08-24 NOTE — Progress Notes (Signed)
Subjective:  Patient ID: [name] [name], male    DOB: 10/17/1920  Age: 85 y.o. MRN: [id number]  CC: No chief complaint on file.   HPI Zariel Boling presents for post-hosp f/u - he was treated for CHF, SOB, hypoxia. He was treated, d/c'd home  Per d/c:  "Admit date: 08/11/2019 Discharge date: 08/16/2019  Admitted From: home  Disposition: Home  Recommendations for Outpatient Follow-up:  1. Follow up with PCP in 1-2 weeks  Home Health: Resume home health Equipment/Devices: Not applicable  Discharge Condition: Stable CODE STATUS: DNR Diet recommendation: Regular diet  Brief/Interim Summary: 85 year old man with chronic combined heart failure, chronic A. fib, stage III chronic kidney disease, anticoagulated on Eliquis presented to the emergency room with cough and shortness of breath, found to be 85% on room air in the emergency room.  Patient was admitted to the hospital and treated for acute on chronic combined heart failure.  Known ejection fraction 25%.  Treated with IV diuresis and now changed to torsemide.  Currently euvolemic.  Has bilateral pleural effusion, underwent right-sided thoracentesis with 1.5 L removed. Now on room air.  Unwilling to have another procedure. Stable. Has persistent A. fib, rate controlled.  On amiodarone.  Cardizem was discontinued and started on metoprolol.  He is therapeutic on Eliquis.  Medically stable.  Wanted to go back home today.  Discharged with his son.  Has fairly terminal stages of congestive heart failure, if further decompensates will benefit with hospice at home.  Discharge Diagnoses:  Principal Problem:   Acute systolic CHF (congestive heart failure) (HCC) Active Problems:   Dyslipidemia   Essential hypertension   Coronary atherosclerosis   BPH (benign prostatic hyperplasia)   Constipation   CKD (chronic kidney disease) stage 3, GFR 30-59 ml/min (HCC)   Thrombocytopenia (CHRONIC)   Chronic anticoagulation   Chronic atrial  fibrillation   Acute respiratory failure with hypoxia [facility])    Discharge Instructions      Discharge Instructions    Diet - low sodium heart healthy   Complete by: As directed    Increase activity slowly   Complete by: As directed    Increase activity slowly   Complete by: As directed           Allergies as of 08/16/2019      Reactions   Lisinopril Cough               Medication List    STOP taking these medications       diltiazem 30 MG tablet Commonly known as: CARDIZEM   lubiprostone 24 MCG capsule Commonly known as: Amitiza   triamcinolone ointment 0.1 % Commonly known as: KENALOG             TAKE these medications       ALPRAZolam 1 MG tablet Commonly known as: XANAX TAKE ONE TABLET AT BEDTIME.   amiodarone 200 MG tablet Commonly known as: PACERONE TAKE 1 TABLET ONCE DAILY.   Eliquis 2.5 MG Tabs tablet Generic drug: apixaban TAKE 1 TABLET BY MOUTH TWICE DAILY. What changed: how much to take   finasteride 5 MG tablet Commonly known as: PROSCAR Take 5 mg by mouth at bedtime.   ICAPS PO Take 1 capsule by mouth daily.   metoprolol tartrate 25 MG tablet Commonly known as: LOPRESSOR Take 0.5 tablets (12.5 mg total) by mouth 2 (two) times daily.   mirtazapine 7.5 MG tablet Commonly known as: REMERON Take 7.5 mg by mouth at bedtime.   polyethylene  glycol powder 17 GM/SCOOP powder Commonly known as: MiraLax 1 capful BID What changed:   how much to take  how to take this  when to take this  additional instructions   pravastatin 20 MG tablet Commonly known as: PRAVACHOL TAKE 1 TABLET EACH DAY. What changed: See the new instructions.   senna 8.6 MG tablet Commonly known as: SENOKOT Take 1 tablet by mouth 2 (two) times daily.   torsemide 10 MG tablet Commonly known as: Demadex Take 1 tablet (10 mg total) by mouth daily. Take 20 mg daily for weight >155 lbs What changed: additional instructions    Ventolin HFA 108 (90 Base) MCG/ACT inhaler Generic drug: albuterol Inhale 2 puffs into the lungs every 6 (six) hours as needed for wheezing or shortness of breath.                  "  Outpatient Medications Prior to Visit  Medication Sig Dispense Refill  . ALPRAZolam (XANAX) 1 MG tablet TAKE ONE TABLET AT BEDTIME. (Patient taking differently: Take 1 mg by mouth at bedtime. ) 30 tablet 2  . amiodarone (PACERONE) 200 MG tablet TAKE 1 TABLET ONCE DAILY. (Patient taking differently: Take 200 mg by mouth daily. ) 30 tablet 11  . ELIQUIS 2.5 MG TABS tablet TAKE 1 TABLET BY MOUTH TWICE DAILY. (Patient taking differently: Take 2.5 mg by mouth 2 (two) times daily. ) 60 tablet 11  . finasteride (PROSCAR) 5 MG tablet Take 5 mg by mouth at bedtime.    . metoprolol tartrate (LOPRESSOR) 25 MG tablet Take 0.5 tablets (12.5 mg total) by mouth 2 (two) times daily. 60 tablet 0  . mirtazapine (REMERON) 7.5 MG tablet Take 7.5 mg by mouth at bedtime.    . Multiple Vitamins-Minerals (ICAPS PO) Take 1 capsule by mouth daily.    . polyethylene glycol powder (MIRALAX) powder 1 capful BID (Patient taking differently: Take 17 g by mouth daily. ) 255 g 0  . pravastatin (PRAVACHOL) 20 MG tablet TAKE 1 TABLET EACH DAY. (Patient taking differently: Take 20 mg by mouth daily. ) 30 tablet 11  . senna (SENOKOT) 8.6 MG tablet Take 1 tablet by mouth 2 (two) times daily.    Marland Kitchen torsemide (DEMADEX) 10 MG tablet Take 1 tablet (10 mg total) by mouth daily. Take 20 mg daily for weight >155 lbs 30 tablet 0  . VENTOLIN HFA 108 (90 Base) MCG/ACT inhaler Inhale 2 puffs into the lungs every 6 (six) hours as needed for wheezing or shortness of breath.   0   No facility-administered medications prior to visit.     ROS: Review of Systems  Constitutional: Positive for fatigue. Negative for appetite change, fever and unexpected weight change.  HENT: Positive for hearing loss. Negative for congestion, nosebleeds, sneezing, sore  throat and trouble swallowing.   Eyes: Positive for visual disturbance. Negative for itching.  Respiratory: Positive for shortness of breath. Negative for cough.   Cardiovascular: Positive for leg swelling. Negative for chest pain and palpitations.  Gastrointestinal: Negative for abdominal distention, blood in stool, diarrhea and nausea.  Genitourinary: Negative for frequency and hematuria.  Musculoskeletal: Positive for arthralgias and gait problem. Negative for back pain, joint swelling and neck pain.  Skin: Negative for rash.  Neurological: Positive for weakness. Negative for dizziness, tremors and speech difficulty.  Psychiatric/Behavioral: Positive for dysphoric mood. Negative for agitation, sleep disturbance and suicidal ideas. The patient is not nervous/anxious.     Objective:  BP 116/72 (BP Location: Left  Arm, Patient Position: Sitting, Cuff Size: Normal)   Pulse 82   Temp (!) 97.4 F (36.3 C) (Oral)   Ht 5\' 7"  (1.702 m)   Wt 154 lb (69.9 kg)   SpO2 94%   BMI 24.12 kg/m   BP Readings from Last 3 Encounters:  08/24/19 116/72  08/23/19 96/64  08/16/19 (!) 126/57    Wt Readings from Last 3 Encounters:  08/24/19 154 lb (69.9 kg)  08/23/19 154 lb (69.9 kg)  08/16/19 152 lb 5.4 oz (69.1 kg)    Physical Exam Constitutional:      General: He is not in acute distress.    Appearance: He is well-developed.     Comments: NAD  Eyes:     Conjunctiva/sclera: Conjunctivae normal.     Pupils: Pupils are equal, round, and reactive to light.  Neck:     Musculoskeletal: Normal range of motion.     Thyroid: No thyromegaly.     Vascular: No JVD.  Cardiovascular:     Rate and Rhythm: Normal rate. Rhythm irregular.     Heart sounds: Normal heart sounds. No murmur. No friction rub. No gallop.   Pulmonary:     Effort: Pulmonary effort is normal. No respiratory distress.     Breath sounds: Normal breath sounds. No wheezing or rales.  Chest:     Chest wall: No tenderness.   Abdominal:     General: Bowel sounds are normal. There is no distension.     Palpations: Abdomen is soft. There is no mass.     Tenderness: There is no abdominal tenderness. There is no guarding or rebound.  Musculoskeletal: Normal range of motion.        General: Swelling present. No tenderness.     Right lower leg: Edema present.     Left lower leg: Edema present.  Lymphadenopathy:     Cervical: No cervical adenopathy.  Skin:    General: Skin is warm and dry.     Findings: Bruising present. No rash.  Neurological:     Mental Status: He is alert and oriented to person, place, and time.     Cranial Nerves: No cranial nerve deficit.     Motor: Weakness present. No abnormal muscle tone.     Coordination: Coordination normal.     Gait: Gait abnormal.     Deep Tendon Reflexes: Reflexes are normal and symmetric.  Psychiatric:        Behavior: Behavior normal.        Thought Content: Thought content normal.        Judgment: Judgment normal.   walker  Lab Results  Component Value Date   WBC 4.7 08/11/2019   HGB 15.6 08/11/2019   HCT 49.3 08/11/2019   PLT 129 (L) 08/11/2019   GLUCOSE 80 08/16/2019   CHOL 112 03/30/2018   TRIG 80 03/30/2018   HDL 39 (L) 03/30/2018   LDLCALC 57 03/30/2018   ALT 7 12/08/2018   AST 20 12/08/2018   NA 139 08/16/2019   K 4.7 08/16/2019   CL 100 08/16/2019   CREATININE 1.69 (H) 08/16/2019   BUN 51 (H) 08/16/2019   CO2 26 08/16/2019   TSH 1.681 03/30/2018   PSA 0.24 03/08/2014   INR 1.28 12/08/2018   HGBA1C 5.7 (H) 03/31/2018    Dg Chest Port 1 View  Result Date: 08/11/2019 CLINICAL DATA:  Shortness of breath EXAM: PORTABLE CHEST 1 VIEW COMPARISON:  Mar 30, 2018 FINDINGS: The cardiac silhouette remains enlarged. There are  aortic calcifications. There are moderate-sized bilateral pleural effusions with adjacent compressive atelectasis. There is no pneumothorax. There are end-stage degenerative changes of the left glenohumeral joint. IMPRESSION:  1. Cardiomegaly with findings consistent with congestive heart failure. 2. Moderate bilateral pleural effusions. 3. Compressive atelectasis at the lung bases bilaterally. Electronically Signed   By: Constance Holster M.D.   On: 08/11/2019 13:17    Assessment & Plan:   There are no diagnoses linked to this encounter.   No orders of the defined types were placed in this encounter.    Follow-up: No follow-ups on file.  Walker Kehr, MD

## 2019-08-27 ENCOUNTER — Telehealth: Payer: Self-pay | Admitting: Internal Medicine

## 2019-08-27 NOTE — Telephone Encounter (Signed)
Home Health Verbal Orders - Caller/Agency:  Cindy Hazy with [facility] Number: [phone] Requesting OT/PT/Skilled Nursing/Social Work/Speech Therapy: Needing verbal orders to start him on hospice care She wants to go ahead

## 2019-08-27 NOTE — Telephone Encounter (Signed)
Verbals given, FYI 

## 2019-09-01 ENCOUNTER — Other Ambulatory Visit: Payer: Self-pay | Admitting: Internal Medicine

## 2019-09-21 ENCOUNTER — Telehealth: Payer: Self-pay | Admitting: Internal Medicine

## 2019-09-21 NOTE — Telephone Encounter (Signed)
Copied from [place] [id number]. Topic: General - Inquiry >> Sep 21, 2019 11:50 AM Richardo Priest, NT wrote: Reason for CRM: Sarah, pt's hospice nurse, called in stating that they are needing another dieretic on board for patient as he is now having fluid build up on his lungs. They stated he has a 2+ pitting edema and a wet sound in his lungs. Asking for another supplement/ lasix to help. Please advise. Call back is [phone].

## 2019-09-21 NOTE — Telephone Encounter (Signed)
Please advise 

## 2019-09-22 NOTE — Telephone Encounter (Signed)
I agree. Thanks.

## 2019-09-22 NOTE — Telephone Encounter (Signed)
[name] [name], pt's Hospice nurse stated she reached out to the Hospice physician to put in an order for pt for a diuretic as pt needed something done as soon as possible yesterday due to fluid in lungs and continuous coughing and pt's pharmacy stopped delivering at 2pm. Pt's torsemide was increased to 2x daily

## 2019-09-23 ENCOUNTER — Ambulatory Visit: Payer: Medicare Other | Admitting: Cardiology

## 2019-09-23 NOTE — Telephone Encounter (Signed)
Hospice nurse notified. 

## 2019-10-04 ENCOUNTER — Telehealth: Payer: Self-pay | Admitting: Internal Medicine

## 2019-10-04 ENCOUNTER — Other Ambulatory Visit: Payer: Self-pay | Admitting: Internal Medicine

## 2019-10-04 NOTE — Telephone Encounter (Signed)
Please advise 

## 2019-10-04 NOTE — Telephone Encounter (Signed)
Pt called in to request a  refill for linaclotide (LINZESS) 290 MCG CAPS capsule. Pt says that he usually have it go to the [place] but would like to know if PCP would fill for him this time?    Pharmacy:  [place], [place] - [phone] [phone] (Phone) [phone] (Fax)

## 2019-10-05 MED ORDER — LINACLOTIDE 290 MCG PO CAPS: 290.0000 ug | ORAL_CAPSULE | Freq: Every day | ORAL | 1 refills | Status: AC

## 2019-10-05 NOTE — Telephone Encounter (Signed)
Ok Thx 

## 2019-10-08 ENCOUNTER — Other Ambulatory Visit: Payer: Self-pay | Admitting: Internal Medicine

## 2019-10-11 ENCOUNTER — Telehealth: Payer: Self-pay | Admitting: Internal Medicine

## 2019-10-11 NOTE — Telephone Encounter (Signed)
[facility] sent a request to refill metoprolol tartrate (LOPRESSOR) 25 MG tablet This medication was not by Dr Alain Marion before but was filled by the Hospital/ Please advise

## 2019-10-12 NOTE — Telephone Encounter (Signed)
RX sent

## 2019-11-09 ENCOUNTER — Telehealth: Payer: Self-pay

## 2019-11-09 NOTE — Telephone Encounter (Signed)
11/09/19 Recd DC from [facility]. Sending to [facility] for Dr. Alain Marion to sign.  11/11/19 Recd signed DC from [facility]. Called [facility] for pick up.

## 2019-11-19 DEATH — deceased

## 2019-11-23 ENCOUNTER — Ambulatory Visit: Payer: Medicare Other | Admitting: Cardiology
